# Patient Record
Sex: Female | Born: 1935 | Race: Black or African American | Hispanic: No | Smoking: Never smoker
Health system: Southern US, Community
[De-identification: ages and names within clinical notes are randomized; demographics above are authoritative.]

## PROBLEM LIST (undated history)

## (undated) DIAGNOSIS — C801 Malignant (primary) neoplasm, unspecified: Secondary | ICD-10-CM

## (undated) DIAGNOSIS — E785 Hyperlipidemia, unspecified: Secondary | ICD-10-CM

## (undated) DIAGNOSIS — Z923 Personal history of irradiation: Secondary | ICD-10-CM

## (undated) DIAGNOSIS — K219 Gastro-esophageal reflux disease without esophagitis: Secondary | ICD-10-CM

## (undated) DIAGNOSIS — E119 Type 2 diabetes mellitus without complications: Secondary | ICD-10-CM

## (undated) DIAGNOSIS — Z4689 Encounter for fitting and adjustment of other specified devices: Secondary | ICD-10-CM

## (undated) DIAGNOSIS — D509 Iron deficiency anemia, unspecified: Secondary | ICD-10-CM

## (undated) DIAGNOSIS — T7840XA Allergy, unspecified, initial encounter: Secondary | ICD-10-CM

## (undated) DIAGNOSIS — F419 Anxiety disorder, unspecified: Secondary | ICD-10-CM

## (undated) DIAGNOSIS — I10 Essential (primary) hypertension: Secondary | ICD-10-CM

## (undated) DIAGNOSIS — M1990 Unspecified osteoarthritis, unspecified site: Secondary | ICD-10-CM

## (undated) HISTORY — PX: CHOLECYSTECTOMY: SHX55

## (undated) HISTORY — DX: Type 2 diabetes mellitus without complications: E11.9

## (undated) HISTORY — DX: Unspecified osteoarthritis, unspecified site: M19.90

## (undated) HISTORY — DX: Iron deficiency anemia, unspecified: D50.9

## (undated) HISTORY — DX: Hyperlipidemia, unspecified: E78.5

## (undated) HISTORY — PX: BREAST LUMPECTOMY: SHX2

## (undated) HISTORY — DX: Allergy, unspecified, initial encounter: T78.40XA

## (undated) HISTORY — DX: Malignant (primary) neoplasm, unspecified: C80.1

## (undated) HISTORY — PX: EYE SURGERY: SHX253

## (undated) HISTORY — DX: Encounter for fitting and adjustment of other specified devices: Z46.89

## (undated) HISTORY — DX: Gastro-esophageal reflux disease without esophagitis: K21.9

## (undated) HISTORY — PX: BREAST SURGERY: SHX581

## (undated) HISTORY — DX: Anxiety disorder, unspecified: F41.9

## (undated) HISTORY — DX: Essential (primary) hypertension: I10

---

## 2001-05-08 ENCOUNTER — Other Ambulatory Visit: Admission: RE | Admit: 2001-05-08 | Discharge: 2001-05-08 | Payer: Self-pay | Admitting: Obstetrics and Gynecology

## 2001-06-04 ENCOUNTER — Ambulatory Visit (HOSPITAL_COMMUNITY): Admission: RE | Admit: 2001-06-04 | Discharge: 2001-06-04 | Payer: Self-pay | Admitting: Obstetrics and Gynecology

## 2001-06-04 ENCOUNTER — Encounter: Payer: Self-pay | Admitting: Obstetrics and Gynecology

## 2002-08-20 ENCOUNTER — Ambulatory Visit (HOSPITAL_COMMUNITY): Admission: RE | Admit: 2002-08-20 | Discharge: 2002-08-20 | Payer: Self-pay | Admitting: Pulmonary Disease

## 2002-09-03 ENCOUNTER — Ambulatory Visit (HOSPITAL_COMMUNITY): Admission: RE | Admit: 2002-09-03 | Discharge: 2002-09-03 | Payer: Self-pay | Admitting: Pulmonary Disease

## 2002-09-24 ENCOUNTER — Ambulatory Visit (HOSPITAL_COMMUNITY): Admission: RE | Admit: 2002-09-24 | Discharge: 2002-09-24 | Payer: Self-pay | Admitting: Pulmonary Disease

## 2004-05-06 ENCOUNTER — Ambulatory Visit: Admission: RE | Admit: 2004-05-06 | Discharge: 2004-08-04 | Payer: Self-pay | Admitting: Radiation Oncology

## 2006-06-11 ENCOUNTER — Emergency Department (HOSPITAL_COMMUNITY): Admission: EM | Admit: 2006-06-11 | Discharge: 2006-06-11 | Payer: Self-pay | Admitting: Emergency Medicine

## 2007-01-08 ENCOUNTER — Ambulatory Visit (HOSPITAL_COMMUNITY): Admission: RE | Admit: 2007-01-08 | Discharge: 2007-01-08 | Payer: Self-pay | Admitting: Pulmonary Disease

## 2007-01-29 ENCOUNTER — Ambulatory Visit (HOSPITAL_COMMUNITY): Admission: RE | Admit: 2007-01-29 | Discharge: 2007-01-29 | Payer: Self-pay

## 2007-02-12 ENCOUNTER — Ambulatory Visit (HOSPITAL_COMMUNITY): Admission: RE | Admit: 2007-02-12 | Discharge: 2007-02-12 | Payer: Self-pay | Admitting: Urology

## 2007-07-17 ENCOUNTER — Ambulatory Visit (HOSPITAL_COMMUNITY): Admission: RE | Admit: 2007-07-17 | Discharge: 2007-07-17 | Payer: Self-pay | Admitting: Hematology and Oncology

## 2007-11-14 ENCOUNTER — Ambulatory Visit (HOSPITAL_COMMUNITY): Admission: RE | Admit: 2007-11-14 | Discharge: 2007-11-14 | Payer: Self-pay | Admitting: Pulmonary Disease

## 2008-07-20 ENCOUNTER — Ambulatory Visit (HOSPITAL_COMMUNITY): Admission: RE | Admit: 2008-07-20 | Discharge: 2008-07-20 | Payer: Self-pay | Admitting: Hematology and Oncology

## 2009-07-22 ENCOUNTER — Ambulatory Visit (HOSPITAL_COMMUNITY): Admission: RE | Admit: 2009-07-22 | Discharge: 2009-07-22 | Payer: Self-pay | Admitting: Hematology and Oncology

## 2009-08-22 ENCOUNTER — Observation Stay (HOSPITAL_COMMUNITY): Admission: EM | Admit: 2009-08-22 | Discharge: 2009-08-23 | Payer: Self-pay | Admitting: Emergency Medicine

## 2009-08-22 ENCOUNTER — Encounter (INDEPENDENT_AMBULATORY_CARE_PROVIDER_SITE_OTHER): Payer: Self-pay | Admitting: General Surgery

## 2010-08-03 ENCOUNTER — Ambulatory Visit (HOSPITAL_COMMUNITY): Admission: RE | Admit: 2010-08-03 | Discharge: 2010-08-03 | Payer: Self-pay

## 2010-09-13 ENCOUNTER — Emergency Department (HOSPITAL_COMMUNITY): Admission: EM | Admit: 2010-09-13 | Discharge: 2010-09-13 | Payer: Self-pay | Admitting: Emergency Medicine

## 2010-12-31 ENCOUNTER — Other Ambulatory Visit (HOSPITAL_COMMUNITY): Payer: Self-pay | Admitting: Hematology and Oncology

## 2010-12-31 DIAGNOSIS — Z9889 Other specified postprocedural states: Secondary | ICD-10-CM

## 2011-01-01 ENCOUNTER — Encounter: Payer: Self-pay | Admitting: Pulmonary Disease

## 2011-03-17 LAB — URINALYSIS, ROUTINE W REFLEX MICROSCOPIC
Glucose, UA: NEGATIVE mg/dL
Ketones, ur: NEGATIVE mg/dL
Nitrite: NEGATIVE
Specific Gravity, Urine: 1.025 (ref 1.005–1.030)
pH: 5.5 (ref 5.0–8.0)

## 2011-03-17 LAB — BASIC METABOLIC PANEL
BUN: 4 mg/dL — ABNORMAL LOW (ref 6–23)
CO2: 27 mEq/L (ref 19–32)
Chloride: 102 mEq/L (ref 96–112)
GFR calc non Af Amer: >60 mL/min (ref >60)
Glucose, Bld: 103 mg/dL — ABNORMAL HIGH (ref 70–99)
Potassium: 4 mEq/L (ref 3.5–5.1)
Sodium: 138 mEq/L (ref 135–145)

## 2011-03-17 LAB — DIFFERENTIAL
Basophils Relative: 0 % (ref 0–1)
Basophils Relative: 0 % (ref 0–1)
Eosinophils Absolute: 0 10*3/uL (ref 0.0–0.7)
Eosinophils Absolute: 0 10*3/uL (ref 0.0–0.7)
Eosinophils Relative: 0 % (ref 0–5)
Lymphs Abs: 1.5 10*3/uL (ref 0.7–4.0)
Monocytes Absolute: 0.7 10*3/uL (ref 0.1–1.0)
Monocytes Relative: 7 % (ref 3–12)
Monocytes Relative: 8 % (ref 3–12)
Neutrophils Relative %: 74 % (ref 43–77)

## 2011-03-17 LAB — URINE MICROSCOPIC-ADD ON

## 2011-03-17 LAB — COMPREHENSIVE METABOLIC PANEL
ALT: 8 U/L (ref 0–35)
AST: 15 U/L (ref 0–37)
Albumin: 3.7 g/dL (ref 3.5–5.2)
Alkaline Phosphatase: 70 U/L (ref 39–117)
Calcium: 9.1 mg/dL (ref 8.4–10.5)
GFR calc Af Amer: >60 mL/min (ref >60)
Potassium: 4.5 mEq/L (ref 3.5–5.1)
Sodium: 136 mEq/L (ref 135–145)
Total Protein: 7.2 g/dL (ref 6.0–8.3)

## 2011-03-17 LAB — CBC
HCT: 36.8 % (ref 36.0–46.0)
Hemoglobin: 12.4 g/dL (ref 12.0–15.0)
Hemoglobin: 12.4 g/dL (ref 12.0–15.0)
MCHC: 34.2 g/dL (ref 30.0–36.0)
MCV: 85.5 fL (ref 78.0–100.0)
RDW: 13.7 % (ref 11.5–15.5)
RDW: 14 % (ref 11.5–15.5)

## 2011-03-17 LAB — GLUCOSE, CAPILLARY: Glucose-Capillary: 136 mg/dL — ABNORMAL HIGH (ref 70–99)

## 2011-04-28 NOTE — Op Note (Signed)
NAME:  Kimberly Best, Kimberly Best                ACCOUNT NO.:  [id number]   MEDICAL RECORD NO.:  [id number]          PATIENT TYPE:  AMB   LOCATION:  DAY                           FACILITY:  [facility]   PHYSICIAN:  Ky Barban, M.D.DATE OF BIRTH:  [birth year]   DATE OF PROCEDURE:  02/12/2007  DATE OF DISCHARGE:                               OPERATIVE REPORT   PREOPERATIVE DIAGNOSIS:  A dilated right ureter.   POSTOPERATIVE DIAGNOSIS:  Right megaureter, probably congenital.   PROCEDURE:  Cystoscopy, right retrograde pyelogram, right ureteroscopy.   ANESTHESIA:  Spinal.   PROCEDURE:  The patient is given spinal anesthesia, placed in lithotomy  position, usual prep and drape.  A #25 cystoscope introduced into the  bladder, which is thoroughly inspected.  No tumors, stone, foreign body  or inflammation.  Right ureteral orifice catheterized with a wedge  catheter.  Hypaque is injected.  The ureter, especially the distal third  of the ureter, is markedly dilated and even the proximal ureter is  dilated.  It looks like megaureter, but I do not see any definite  filling defect.  There is obstruction at the ureteral orifice level.  Decided to look into that area as a guidewire is passed up into the  renal pelvis and over the guidewire short rigid ureteroscope was  introduced.  The ureter up to the mid ureter was inspected and looks  fine.  The ureteral orifice is slightly tight but decided not to dilate  this accept passing the ureteroscope has dilated that.  All the  instruments are removed.  The patient left the operating room in  satisfactory condition.      Ky Barban, M.D.  Electronically Signed     MIJ/MEDQ  D:  02/12/2007  T:  02/12/2007  Job:  [id number]

## 2011-04-28 NOTE — H&P (Signed)
NAME:  Kimberly Best, Kimberly Best                ACCOUNT NO.:  [id number]   MEDICAL RECORD NO.:  [id number]          PATIENT TYPE:  AMB   LOCATION:  DAY                           FACILITY:  [facility]   PHYSICIAN:  Ky Barban, M.D.DATE OF BIRTH:  Aug 23, 1933   DATE OF ADMISSION:  DATE OF DISCHARGE:  LH                              HISTORY & PHYSICAL   CHIEF COMPLAINT:  Dilated right distal ureter.   HISTORY:  This 77 year old female had an episode of hematuria.  CT scan  was done that showed right ureter in the distal quad was dilated.  Looks  like ureterocele.  The upper ureter and the kidney is not dilated. She  is not having any pain.  There is no stone visible on CT scan.  The  patient has a history of having cancer of the right breast.  Had  lumpectomy followed by radiation and chemotherapy and she is very  concerned about this problem, so I have suggested we will do a  cystoscopy and possible ureteroscopy under anesthesia as outpatient.  The procedure, its limitations and complications discussed.   PAST MEDICAL HISTORY:  Past history is as mentioned above.   PERSONAL HISTORY:  Does not smoke or drink.   REVIEW OF SYSTEMS:  Unremarkable.   ON EXAMINATION:  VITAL SIGNS:  Moderately obese.  Blood pressure 140/80.  Temperature is normal.  CENTRAL NERVOUS SYSTEM:  No gross neurological deficit.  HEENT: Negative.  NECK: Negative.  CHEST:  Symmetrical.  HEART:  Regular.  Sinus rhythm.  ABDOMEN:  Soft, full.  Liver, spleen, kidneys not palpable.  No CVA  tenderness.  PELVIC EXAM:  No adnexal mass or tenderness.   IMPRESSION:  1. Dilated distal right ureter, possible ureterocele.  2. Cancer of the right breast, status post lumpectomy and radiation      and chemotherapy.   PLAN:  Cystoscopy, right retrograde pyelogram, and possible ureteroscopy  under anesthesia as out patient.     Ky Barban, M.D.  Electronically Signed    MIJ/MEDQ  D:  02/11/2007  T:  02/11/2007  Job:   [id number]

## 2011-08-09 ENCOUNTER — Ambulatory Visit (HOSPITAL_COMMUNITY)
Admission: RE | Admit: 2011-08-09 | Discharge: 2011-08-09 | Disposition: A | Discharge status: Home / Self Care | Payer: Medicare Other | Source: Ambulatory Visit | Attending: Hematology and Oncology | Admitting: Hematology and Oncology

## 2011-08-09 DIAGNOSIS — Z853 Personal history of malignant neoplasm of breast: Secondary | ICD-10-CM | POA: Insufficient documentation

## 2011-08-09 DIAGNOSIS — Z9889 Other specified postprocedural states: Secondary | ICD-10-CM

## 2011-08-21 ENCOUNTER — Other Ambulatory Visit (HOSPITAL_COMMUNITY): Payer: Self-pay | Admitting: Hematology and Oncology

## 2011-08-21 DIAGNOSIS — Z139 Encounter for screening, unspecified: Secondary | ICD-10-CM

## 2011-12-12 HISTORY — PX: BREAST BIOPSY: SHX20

## 2012-08-14 ENCOUNTER — Other Ambulatory Visit (HOSPITAL_COMMUNITY): Payer: Self-pay | Admitting: Hematology and Oncology

## 2012-08-14 ENCOUNTER — Ambulatory Visit (HOSPITAL_COMMUNITY)
Admission: RE | Admit: 2012-08-14 | Discharge: 2012-08-14 | Disposition: A | Discharge status: Home / Self Care | Payer: Medicare Other | Source: Ambulatory Visit | Attending: Hematology and Oncology | Admitting: Hematology and Oncology

## 2012-08-14 DIAGNOSIS — Z139 Encounter for screening, unspecified: Secondary | ICD-10-CM

## 2012-08-14 DIAGNOSIS — R922 Inconclusive mammogram: Secondary | ICD-10-CM | POA: Insufficient documentation

## 2012-08-19 ENCOUNTER — Other Ambulatory Visit: Payer: Self-pay | Admitting: Hematology and Oncology

## 2012-08-19 DIAGNOSIS — R928 Other abnormal and inconclusive findings on diagnostic imaging of breast: Secondary | ICD-10-CM

## 2012-08-21 ENCOUNTER — Encounter: Payer: Medicare Other | Admitting: Hematology and Oncology

## 2012-08-21 DIAGNOSIS — M899 Disorder of bone, unspecified: Secondary | ICD-10-CM

## 2012-08-21 DIAGNOSIS — C50319 Malignant neoplasm of lower-inner quadrant of unspecified female breast: Secondary | ICD-10-CM

## 2012-08-21 DIAGNOSIS — Z170 Estrogen receptor positive status [ER+]: Secondary | ICD-10-CM

## 2012-08-21 DIAGNOSIS — R920 Mammographic microcalcification found on diagnostic imaging of breast: Secondary | ICD-10-CM

## 2012-08-21 DIAGNOSIS — M949 Disorder of cartilage, unspecified: Secondary | ICD-10-CM

## 2012-08-28 ENCOUNTER — Other Ambulatory Visit: Payer: Self-pay | Admitting: Hematology and Oncology

## 2012-08-28 ENCOUNTER — Ambulatory Visit (HOSPITAL_COMMUNITY)
Admission: RE | Admit: 2012-08-28 | Discharge: 2012-08-28 | Disposition: A | Discharge status: Home / Self Care | Payer: Medicare Other | Source: Ambulatory Visit | Attending: Hematology and Oncology | Admitting: Hematology and Oncology

## 2012-08-28 DIAGNOSIS — Z853 Personal history of malignant neoplasm of breast: Secondary | ICD-10-CM | POA: Insufficient documentation

## 2012-08-28 DIAGNOSIS — R921 Mammographic calcification found on diagnostic imaging of breast: Secondary | ICD-10-CM

## 2012-08-28 DIAGNOSIS — R928 Other abnormal and inconclusive findings on diagnostic imaging of breast: Secondary | ICD-10-CM | POA: Insufficient documentation

## 2012-09-03 ENCOUNTER — Ambulatory Visit
Admission: RE | Admit: 2012-09-03 | Discharge: 2012-09-03 | Disposition: A | Discharge status: Home / Self Care | Payer: Medicare Other | Source: Ambulatory Visit | Attending: Hematology and Oncology | Admitting: Hematology and Oncology

## 2012-09-03 DIAGNOSIS — R921 Mammographic calcification found on diagnostic imaging of breast: Secondary | ICD-10-CM

## 2012-09-16 ENCOUNTER — Encounter: Payer: Medicare Other | Admitting: Internal Medicine

## 2012-09-16 ENCOUNTER — Ambulatory Visit (HOSPITAL_COMMUNITY): Payer: Medicare Other | Admitting: Oncology

## 2012-09-23 DIAGNOSIS — C50919 Malignant neoplasm of unspecified site of unspecified female breast: Secondary | ICD-10-CM

## 2013-03-19 ENCOUNTER — Other Ambulatory Visit: Payer: Self-pay | Admitting: Pulmonary Disease

## 2013-03-19 DIAGNOSIS — R921 Mammographic calcification found on diagnostic imaging of breast: Secondary | ICD-10-CM

## 2013-04-09 ENCOUNTER — Ambulatory Visit (HOSPITAL_COMMUNITY)
Admission: RE | Admit: 2013-04-09 | Discharge: 2013-04-09 | Disposition: A | Discharge status: Home / Self Care | Payer: Medicare Other | Source: Ambulatory Visit | Attending: Pulmonary Disease | Admitting: Pulmonary Disease

## 2013-04-09 DIAGNOSIS — N6019 Diffuse cystic mastopathy of unspecified breast: Secondary | ICD-10-CM | POA: Insufficient documentation

## 2013-04-09 DIAGNOSIS — R921 Mammographic calcification found on diagnostic imaging of breast: Secondary | ICD-10-CM

## 2013-04-09 DIAGNOSIS — Z09 Encounter for follow-up examination after completed treatment for conditions other than malignant neoplasm: Secondary | ICD-10-CM | POA: Insufficient documentation

## 2013-08-13 ENCOUNTER — Other Ambulatory Visit (HOSPITAL_COMMUNITY): Payer: Self-pay | Admitting: Pulmonary Disease

## 2013-08-13 DIAGNOSIS — Z139 Encounter for screening, unspecified: Secondary | ICD-10-CM

## 2013-08-18 ENCOUNTER — Ambulatory Visit (HOSPITAL_COMMUNITY)
Admission: RE | Admit: 2013-08-18 | Discharge: 2013-08-18 | Disposition: A | Discharge status: Home / Self Care | Payer: Medicare Other | Source: Ambulatory Visit | Attending: Pulmonary Disease | Admitting: Pulmonary Disease

## 2013-08-18 DIAGNOSIS — Z139 Encounter for screening, unspecified: Secondary | ICD-10-CM

## 2013-08-18 DIAGNOSIS — Z1231 Encounter for screening mammogram for malignant neoplasm of breast: Secondary | ICD-10-CM | POA: Insufficient documentation

## 2013-09-24 LAB — PULMONARY FUNCTION TEST

## 2014-06-10 ENCOUNTER — Ambulatory Visit (HOSPITAL_COMMUNITY)
Admission: RE | Admit: 2014-06-10 | Discharge: 2014-06-10 | Disposition: A | Discharge status: Home / Self Care | Payer: Medicare Other | Source: Ambulatory Visit | Attending: Pulmonary Disease | Admitting: Pulmonary Disease

## 2014-06-10 DIAGNOSIS — R32 Unspecified urinary incontinence: Secondary | ICD-10-CM | POA: Diagnosis not present

## 2014-06-10 DIAGNOSIS — M6281 Muscle weakness (generalized): Secondary | ICD-10-CM | POA: Insufficient documentation

## 2014-06-10 DIAGNOSIS — IMO0001 Reserved for inherently not codable concepts without codable children: Secondary | ICD-10-CM | POA: Insufficient documentation

## 2014-06-10 DIAGNOSIS — N3941 Urge incontinence: Secondary | ICD-10-CM

## 2014-06-10 DIAGNOSIS — N393 Stress incontinence (female) (male): Secondary | ICD-10-CM

## 2014-06-10 NOTE — Evaluation (Signed)
Physical Therapy Evaluation  Patient Details  Name: Kimberly Best MRN: [id number] Date of Birth: 17-Aug-1933  Today's Date: 06/10/2014 Time: 4098-1191 PT Time Calculation (min): 45 min Charge:  Evaluation 1430-1515              Visit#: 1 of 8  Re-eval: 07/10/14 Assessment Diagnosis: urinary incontinence. Prior Therapy: none   Authorization: [employer] medicare    Past Medical History: No past medical history on file. Past Surgical History: No past surgical history on file.  Subjective Symptoms/Limitations Symptoms: Kimberly Best states that her bladder has been dropping for the past several years.  She had a sling placed to hold her bladder up  in 2003 but they had to take it out every two months and then it was bothering so much that she did not want it inserted again.  She was told to wear a girdle but this does not seem like it is helping  Pertinent History: breast cancer 2005. Patient Stated Goals: urinate less frequent and  less accidents  Pain Assessment Currently in Pain?: No/denies  Balance Screening  no falls    Cognition/Observation Observation/Other Assessments Observations: protruding abdominal mm   Sensation/Coordination/Flexibility/Functional Tests Functional Tests Functional Tests: Strong urge to urinate=wet; running water =wet; accidents per week 14; keys in door= wet;  Functional Tests: feel urge to urinate= every 2 hours.  Functional Tests: coughing and sneezing =wet  Assessment Lumbar Strength Lumbar Flexion:  (upper abdominal 3-/5; Lower abdominal 3-)  Exercise/Treatments    Supine Ab Set: 10 reps Other Supine Lumbar Exercises: Kegal 10x contract 2 sec relx 4 seconds; 10x contract 12 seconds relax 24 seconds   Physical Therapy Assessment and Plan PT Assessment and Plan Clinical Impression Statement: Pt is a 80 yo female who has had a prolapsed uterus for many years.  She is currently being referred to PT for urinary incontinence.  Examination demonstrates  weakened abdominal mm, decreased core strength. Pt will benefit from skilled PT to educate in posture, core strengthening mm and Kegals to improve her urinary functionn and improve her quality of life. PT Frequency: Min 2X/week PT Duration: 4 weeks PT Treatment/Interventions: Therapeutic exercise PT Plan: begin heelslides; bent knee raises; hip abduction progress to SLR,  prone SLR then progess to quadriped, abdominal curls  as well as T-band exercises for incontinence:(PNF patterns, putting, flex Rt/ LT and extension B.    Goals PT Short Term Goals Time to Complete Short Term Goals: 2 weeks PT Short Term Goal 1: Pt will be able to demonstrate HEP accurately PT Short Term Goal 2: increase voiding interval to every three hours  PT Short Term Goal 3: decrease accidents from 14 to 8 per week  PT Long Term Goals Time to Complete Long Term Goals: 4 weeks PT Long Term Goal 1: increase pelvic floor endurance to 20 seconds PT Long Term Goal 2: decrease accident to 5 per week.  Long Term Goal 3: improve abdominal strength to 4-/5  Long Term Goal 4: Pt to be able to verbalize bladder irritants and state she has decreased the consumption of these items   Problem List Patient Active Problem List   Diagnosis Date Noted  . Female stress incontinence 06/10/2014  . Urge incontinence 06/10/2014    PT Plan of Care PT Home Exercise Plan: given  GP Functional Assessment Tool Used: clinical judgement Functional Limitation: Other PT primary Other PT Primary Current Status (Y7829): At least 60 percent but less than 80 percent impaired, limited or restricted Other PT  Primary Goal Status [id number]): At least 40 percent but less than 60 percent impaired, limited or restricted  RUSSELL,CINDY 06/10/2014, 4:43 PM  Physician Documentation Your signature is required to indicate approval of the treatment plan as stated above.  Please sign and either send electronically or make a copy of this report for your files  and return this physician signed original.   Please mark one 1.__approve of plan  2. ___approve of plan with the following conditions.   ______________________________                                                          _____________________ Physician Signature                                                                                                             Date

## 2014-06-17 ENCOUNTER — Ambulatory Visit (HOSPITAL_COMMUNITY)
Admission: RE | Admit: 2014-06-17 | Discharge: 2014-06-17 | Disposition: A | Discharge status: Home / Self Care | Payer: Medicare Other | Source: Ambulatory Visit

## 2014-06-17 DIAGNOSIS — IMO0001 Reserved for inherently not codable concepts without codable children: Secondary | ICD-10-CM | POA: Diagnosis not present

## 2014-06-17 DIAGNOSIS — N393 Stress incontinence (female) (male): Secondary | ICD-10-CM

## 2014-06-17 DIAGNOSIS — N3941 Urge incontinence: Secondary | ICD-10-CM

## 2014-06-17 NOTE — Progress Notes (Signed)
Physical Therapy Treatment Patient Details  Name: Kimberly Best MRN: [id number] Date of Birth: 09/07/1933  Today's Date: 06/17/2014 Time: 1520-1550 PT Time Calculation (min): 30 min Charge: TE 1520-1550   Visit#: 2 of 8  Re-eval: 07/10/14 Assessment Diagnosis: urinary incontinence. Next MD Visit: Kimberly Best unscheduled Prior Therapy: none   Authorization: [employer] medicare  Authorization Time Period:    Authorization Visit#:   of     Subjective: Symptoms/Limitations Symptoms: Pt reported complaince with kegal exercises and reports no incontience episodes since initial eval.  Pain free today.   Pain Assessment Currently in Pain?: No/denies  Objective:   Exercise/Treatments Supine Ab Set: 10 reps Heel Slides: 10 reps;Limitations Heel Slides Limitations: with tactile and verbal cueing for contraction Bent Knee Raise: 10 reps;5 seconds;Limitations Bent Knee Raise Limitations: tactile and verbal cueing for contraction Other Supine Lumbar Exercises: Kegal 10x contraction Sidelying Hip Abduction: 10 reps Prone  Straight Leg Raise: 10 reps     Physical Therapy Assessment and Plan PT Assessment and Plan Clinical Impression Statement: Began PT POC for pelvic floor contraciton strengtheing to assist with incontience control.  Tactile and verbal cueing for correct mm activation.  Pt was able to demonstrate appropriate mm contraction but unable to hold due to core weakness.  Began gluteal strengtehning exercises with ab sets, min cueing for form and techniuqe.  Main cueing required for control with exercises.   PT Plan: begin heelslides; bent knee raises; hip abduction progress to SLR,  prone SLR then progess to quadriped, abdominal curls  as well as T-band exercises for incontinence:(PNF patterns, putting, flex Rt/ LT and extension B.    Goals PT Short Term Goals PT Short Term Goal 1: Pt will be able to demonstrate HEP accurately PT Short Term Goal 2: increase voiding interval to every  three hours  PT Short Term Goal 2 - Progress: Progressing toward goal PT Short Term Goal 3: decrease accidents from 14 to 8 per week  PT Short Term Goal 3 - Progress: Progressing toward goal  Problem List Patient Active Problem List   Diagnosis Date Noted  . Female stress incontinence 06/10/2014  . Urge incontinence 06/10/2014    PT - End of Session Activity Tolerance: Patient tolerated treatment well General Behavior During Therapy: Kimberly Best for tasks assessed/performed  GP    Kimberly Best 06/17/2014, 3:56 PM

## 2014-06-19 ENCOUNTER — Inpatient Hospital Stay (HOSPITAL_COMMUNITY): Admission: RE | Admit: 2014-06-19 | Payer: Medicare Other | Source: Ambulatory Visit

## 2014-06-23 ENCOUNTER — Ambulatory Visit (HOSPITAL_COMMUNITY)
Admission: RE | Admit: 2014-06-23 | Discharge: 2014-06-23 | Disposition: A | Discharge status: Home / Self Care | Payer: Medicare Other | Source: Ambulatory Visit | Attending: Physical Therapy | Admitting: Physical Therapy

## 2014-06-23 DIAGNOSIS — IMO0001 Reserved for inherently not codable concepts without codable children: Secondary | ICD-10-CM | POA: Diagnosis not present

## 2014-06-23 NOTE — Progress Notes (Signed)
  Physical Therapy Treatment Patient Details  Name: Kimberly Best MRN: [id number] Date of Birth: 14-Mar-1933  Today's Date: 06/23/2014 Time: 1350-1430 PT Time Calculation (min): 40 min TE 8937-3428  Visit#: 3 of 8  Re-eval: 07/10/14 Assessment Diagnosis: urinary incontinence.  Authorization: [employer] medicare  Subjective: Symptoms/Limitations Symptoms: Pt reports mild soreness in abs after last treatment session, no complaints of pain today.   Pt reports cramping into legs in the past couple days as her rx for potassium has run out.    Exercise/Treatments Stretches Sports administrator: 30 seconds;1 rep;Limitations Sports administrator Limitations: Manual Stretch Supine Ab Set: 10 reps;3 seconds Heel Slides: 20 reps (2x10) Heel Slides Limitations: with tactile and verbal cueing for contraction Bent Knee Raise: 15 reps;5 seconds Bent Knee Raise Limitations: TC for contraction Straight Leg Raise: 20 reps (2x10) Other Supine Lumbar Exercises: Kegal/Glut Set 3" x10 Other Supine Lumbar Exercises: Manual HS/calf stretch 30" x1 Sidelying Hip Abduction: 20 reps (2x10) Prone  Straight Leg Raise: 20 reps (2x10)  Physical Therapy Assessment and Plan PT Assessment and Plan Clinical Impression Statement: Continued POC increasing reps of exercises to tolerance.  Noted delayed response of abs with contraction, requiring tactile cueing to initiate response.  Pt did report cramping into (B) LE during treatment session, and manual stretching was completed of calf, HS, and quads x1 until sensations decreased.  Pt did require VC for breathing as pt tended to hold her breath during sustained contractions; VC to breath out during contractions vs. holding breath.   Pt will benefit from skilled therapeutic intervention in order to improve on the following deficits: Decreased activity tolerance;Decreased strength Rehab Potential: Good PT Frequency: Min 2X/week PT Duration: 4 weeks PT Treatment/Interventions:  Therapeutic exercise PT Plan: Progress exercises to tolerance, with increasing reps vs. increasing contraction time.  Tband exercises (PNF pattern, putting, flex Rt/Lt, and extension (B))     Problem List Patient Active Problem List   Diagnosis Date Noted  . Female stress incontinence 06/10/2014  . Urge incontinence 06/10/2014    PT - End of Session Equipment Utilized During Treatment: Gait belt Activity Tolerance: Patient tolerated treatment well General Behavior During Therapy: WFL for tasks assessed/performed PT Plan of Care PT Home Exercise Plan: Updated HEP for abdominal isometric, bent knee raise, heel slide, SLR fl/abd/ext   Rithwik Schmieg 06/23/2014, 2:45 PM

## 2014-06-25 ENCOUNTER — Inpatient Hospital Stay (HOSPITAL_COMMUNITY): Admission: RE | Admit: 2014-06-25 | Payer: Medicare Other | Source: Ambulatory Visit

## 2014-06-25 ENCOUNTER — Telehealth (HOSPITAL_COMMUNITY): Payer: Self-pay

## 2014-06-30 ENCOUNTER — Telehealth (HOSPITAL_COMMUNITY): Payer: Self-pay | Admitting: Physical Therapy

## 2014-06-30 ENCOUNTER — Inpatient Hospital Stay (HOSPITAL_COMMUNITY): Admission: RE | Admit: 2014-06-30 | Payer: Medicare Other | Source: Ambulatory Visit | Admitting: Physical Therapy

## 2014-06-30 NOTE — Telephone Encounter (Signed)
Patient missed todays appointment.  Contacted by phone and was informed her husband had passed away.  Patient asked to cancel all appointments this week.

## 2014-07-02 ENCOUNTER — Ambulatory Visit (HOSPITAL_COMMUNITY): Payer: Medicare Other | Admitting: Physical Therapy

## 2014-07-07 ENCOUNTER — Ambulatory Visit (HOSPITAL_COMMUNITY): Payer: Medicare Other | Admitting: Physical Therapy

## 2014-07-09 ENCOUNTER — Ambulatory Visit (HOSPITAL_COMMUNITY): Admission: RE | Admit: 2014-07-09 | Payer: Medicare Other | Source: Ambulatory Visit | Admitting: Physical Therapy

## 2014-08-10 ENCOUNTER — Other Ambulatory Visit (HOSPITAL_COMMUNITY): Payer: Self-pay | Admitting: Pulmonary Disease

## 2014-08-10 DIAGNOSIS — Z139 Encounter for screening, unspecified: Secondary | ICD-10-CM

## 2014-08-20 ENCOUNTER — Ambulatory Visit (INDEPENDENT_AMBULATORY_CARE_PROVIDER_SITE_OTHER): Payer: Medicare Other | Admitting: Otolaryngology

## 2014-08-20 DIAGNOSIS — H903 Sensorineural hearing loss, bilateral: Secondary | ICD-10-CM

## 2014-08-20 DIAGNOSIS — J343 Hypertrophy of nasal turbinates: Secondary | ICD-10-CM

## 2014-08-20 DIAGNOSIS — R42 Dizziness and giddiness: Secondary | ICD-10-CM

## 2014-08-20 DIAGNOSIS — J310 Chronic rhinitis: Secondary | ICD-10-CM

## 2014-08-21 ENCOUNTER — Ambulatory Visit (HOSPITAL_COMMUNITY)
Admission: RE | Admit: 2014-08-21 | Discharge: 2014-08-21 | Disposition: A | Discharge status: Home / Self Care | Payer: Medicare Other | Source: Ambulatory Visit | Attending: Pulmonary Disease | Admitting: Pulmonary Disease

## 2014-08-21 DIAGNOSIS — Z139 Encounter for screening, unspecified: Secondary | ICD-10-CM

## 2014-08-21 DIAGNOSIS — Z1231 Encounter for screening mammogram for malignant neoplasm of breast: Secondary | ICD-10-CM | POA: Diagnosis present

## 2014-08-27 ENCOUNTER — Other Ambulatory Visit (INDEPENDENT_AMBULATORY_CARE_PROVIDER_SITE_OTHER): Payer: Self-pay | Admitting: Otolaryngology

## 2014-08-27 DIAGNOSIS — J320 Chronic maxillary sinusitis: Secondary | ICD-10-CM

## 2014-08-31 ENCOUNTER — Ambulatory Visit (HOSPITAL_COMMUNITY)
Admission: RE | Admit: 2014-08-31 | Discharge: 2014-08-31 | Disposition: A | Discharge status: Home / Self Care | Payer: Medicare Other | Source: Ambulatory Visit | Attending: Otolaryngology | Admitting: Otolaryngology

## 2014-08-31 DIAGNOSIS — J329 Chronic sinusitis, unspecified: Secondary | ICD-10-CM | POA: Diagnosis present

## 2014-08-31 DIAGNOSIS — J320 Chronic maxillary sinusitis: Secondary | ICD-10-CM

## 2014-09-10 ENCOUNTER — Ambulatory Visit (INDEPENDENT_AMBULATORY_CARE_PROVIDER_SITE_OTHER): Payer: Medicare Other | Admitting: Otolaryngology

## 2014-09-10 DIAGNOSIS — J310 Chronic rhinitis: Secondary | ICD-10-CM

## 2014-09-10 DIAGNOSIS — J343 Hypertrophy of nasal turbinates: Secondary | ICD-10-CM

## 2014-11-09 ENCOUNTER — Encounter: Payer: Medicare Other | Admitting: Nutrition

## 2014-11-12 ENCOUNTER — Ambulatory Visit (INDEPENDENT_AMBULATORY_CARE_PROVIDER_SITE_OTHER): Payer: Medicare Other | Admitting: Otolaryngology

## 2014-11-12 DIAGNOSIS — J343 Hypertrophy of nasal turbinates: Secondary | ICD-10-CM

## 2014-11-12 DIAGNOSIS — J310 Chronic rhinitis: Secondary | ICD-10-CM

## 2014-11-18 ENCOUNTER — Encounter: Payer: Self-pay | Admitting: Nutrition

## 2014-11-18 ENCOUNTER — Encounter: Payer: Medicare Other | Attending: Pulmonary Disease | Admitting: Nutrition

## 2014-11-18 VITALS — Ht <= 58 in | Wt 161.0 lb

## 2014-11-18 DIAGNOSIS — IMO0002 Reserved for concepts with insufficient information to code with codable children: Secondary | ICD-10-CM

## 2014-11-18 DIAGNOSIS — E1165 Type 2 diabetes mellitus with hyperglycemia: Secondary | ICD-10-CM

## 2014-11-18 DIAGNOSIS — E118 Type 2 diabetes mellitus with unspecified complications: Secondary | ICD-10-CM | POA: Diagnosis not present

## 2014-11-18 DIAGNOSIS — Z713 Dietary counseling and surveillance: Secondary | ICD-10-CM | POA: Insufficient documentation

## 2014-11-18 NOTE — Progress Notes (Signed)
  Medical Nutrition Therapy:  Appt start time: 0800 end time:  0900.   Assessment:  Primary concerns today: Diabetes.Lives by herself. Lost her husband in July. Cooks and shops for herself. Has a meter but hasn't been checking. Willing to check BS a few times per week. History of breast cancer and takes weekly chemotherapy pills. Complains of pains in her head and headaches/dizziness at times. Going to see a neurologist. MRI. Last A1C unavailable at present. Requested copy from PCP. Not able to exercise much due to headaches and dizziness. Tries to do some chair exercises.   Preferred Learning Style:     No preference indicated   Learning Readiness:     Ready  Change in progress   MEDICATIONS: see list   DIETARY INTAKE:  24-hr recall:  B ( AM): Coco puffs, skim milk, OR 2 eggs cinnamon toast with raisin, coffee, water  Snk ( AM): peanuts or nabs L ( PM): chicken salad sandwich with lettuce on ww bread,  With banana or grapes, water, Sprite Snk ( PM): nabs, or fruit or nuts or animal crackers D ( PM): Chicken, water Snk ( PM): peanuts Beverages: cranberry juice, water, sprite  Usual physical activity: ADL's  Estimated energy needs: 1200 calories 135 g carbohydrates 90 g protein 33 g fat  Progress Towards Goal(s):  In progress.   Nutritional Diagnosis:  NB-1.1 Food and nutrition-related knowledge deficit As related to Diabetes.  As evidenced by A1c >6.5%.    Intervention:  Nutrition counseling and diabetes eduction on diet, exercise, and target ranges for blood sugars. . Plan:  Aim for 2-3 Carb Choices per meal (30-45 grams) +/- 1 either way  Avoid snacks between meal unless eating vegetables. Drink water instead Sprite or juice Follow Plate Method. Cut out sweets, candy and cookies Include protein in moderation with your meals Consider  increasing your activity level by 15  minutes daily as tolerated Be sure to take medication Metformin as directed by MD Goal:  Get A1C down to 6.5% or below.  Teaching Method Utilized:  Visual Auditory Hands on  Handouts given during visit include: The Plate Method Carb Counting and Food Label handouts Meal Plan Card  Barriers to learning/adherence to lifestyle change: none  Demonstrated degree of understanding via:  Teach Back   Monitoring/Evaluation:  Dietary intake, exercise, meal planning, and body weight in 3 month(s).

## 2014-11-18 NOTE — Patient Instructions (Signed)
Plan:  Aim for 2-3 Carb Choices per meal (45 grams) +/- 1 either way  Avoid snacks between meal unless eating vegetables. Drink water instead Sprite or juice Follow Plate Method. Cut out sweets, candy and cookies Include protein in moderation with your meals Consider  increasing your activity level by 15  minutes daily as tolerated Be sure to take medication Metformin as directed by MD Goal: Get A1C down to 6.5% or below.

## 2014-12-15 DIAGNOSIS — G4459 Other complicated headache syndrome: Secondary | ICD-10-CM | POA: Diagnosis not present

## 2014-12-15 DIAGNOSIS — I951 Orthostatic hypotension: Secondary | ICD-10-CM | POA: Diagnosis not present

## 2014-12-15 DIAGNOSIS — R42 Dizziness and giddiness: Secondary | ICD-10-CM | POA: Diagnosis not present

## 2015-01-19 DIAGNOSIS — M545 Low back pain: Secondary | ICD-10-CM | POA: Diagnosis not present

## 2015-01-19 DIAGNOSIS — E119 Type 2 diabetes mellitus without complications: Secondary | ICD-10-CM | POA: Diagnosis not present

## 2015-01-19 DIAGNOSIS — N8189 Other female genital prolapse: Secondary | ICD-10-CM | POA: Diagnosis not present

## 2015-01-19 DIAGNOSIS — I10 Essential (primary) hypertension: Secondary | ICD-10-CM | POA: Diagnosis not present

## 2015-01-28 ENCOUNTER — Encounter: Payer: Self-pay | Admitting: *Deleted

## 2015-02-01 ENCOUNTER — Encounter: Payer: Self-pay | Admitting: Obstetrics and Gynecology

## 2015-02-01 ENCOUNTER — Ambulatory Visit (INDEPENDENT_AMBULATORY_CARE_PROVIDER_SITE_OTHER): Payer: Medicare Other | Admitting: Obstetrics and Gynecology

## 2015-02-01 VITALS — BP 120/70 | Wt 157.0 lb

## 2015-02-01 DIAGNOSIS — N813 Complete uterovaginal prolapse: Secondary | ICD-10-CM | POA: Diagnosis not present

## 2015-02-01 NOTE — Progress Notes (Addendum)
Patient ID: Kimberly Best, female   DOB: 1933/09/22, 81 y.o.   MRN: [id number]    [facility] Visit  Patient name: Kimberly Best MRN [id number]  Date of birth: 18-Sep-1933  CC & HPI:  Kimberly Best is a 81 y.o. female presenting today for recurrent hanging bladder. Pt notes she was wearing a pessary, but stopped using it because of recurrent infections. Pt had 2 children, one born by vaginal delivery. She reports loose BMs and denies defecation difficulties.  Pt has been seeking care in [place], Dr Adah Perl, using pessary, but she has quit using pessary due to recurrent need to be seen to clean pessary. Pt's prolapse has become more symptomatic since then.  ROS:  A complete 10 system review of systems was obtained and all systems are negative except as noted in the HPI and PMH.   Pertinent History Reviewed:   Reviewed Medical         Past Medical History  Diagnosis Date  . Diabetes mellitus without complication   . Cancer   . Anxiety   . GERD (gastroesophageal reflux disease)                               Surgical Hx:    Past Surgical History  Procedure Laterality Date  . Breast surgery      lumpectomy    Medications: Reviewed & Updated - see associated section                       Current outpatient prescriptions:  .  alendronate (FOSAMAX) 70 MG tablet, Take 70 mg by mouth. Take with a full glass of water on an empty stomach., Disp: , Rfl:  .  ALPRAZolam (XANAX) 0.5 MG tablet, Take 0.5 mg by mouth at bedtime as needed for anxiety., Disp: , Rfl:  .  lisinopril (PRINIVIL,ZESTRIL) 2.5 MG tablet, Take 2.5 mg by mouth daily., Disp: , Rfl:  .  metFORMIN (GLUCOPHAGE) 500 MG tablet, Take 500 mg by mouth 2 (two) times daily with a meal., Disp: , Rfl:  .  omeprazole (PRILOSEC) 20 MG capsule, Take 20 mg by mouth daily., Disp: , Rfl:  .  oxycodone-acetaminophen (PERCOCET) 2.5-325 MG per tablet, Take 1 tablet by mouth every 4 (four) hours as needed for pain., Disp: , Rfl:     Social History: Reviewed -  reports that she has never smoked. She has never used smokeless tobacco.  Objective Findings:  Vitals: Blood pressure 120/70, weight 157 lb (71.215 kg).  Physical Examination: General appearance - alert, well appearing, and in no distress and oriented to person, place, and time Pelvic - normal external genitalia, vulva, vagina, cervix, uterus and adnexa VULVA: normal appearing vulva with no masses, tenderness or lesions  Lax introitus with complete prolapse 4-6 cm beyond introitus.  VAGINA: normal  Vagina skin with normal color , no lesions CERVIX: normal appearing cervix without discharge or lesions UTERUS: Complete uterus prolapse  ADNEXA: normal adnexa in size, nontender and no masses BLADDER: Complete prolapse GI: BM loose; no defecation difficulties; hemoccult negative rectal support is adequate for rectal function with control  Assessment & Plan:   A:  uterovaginal prolapse , complete 1. Complete uterine and bladder prolapse 2. Loose stools; no defecation difficulties 3. Hemoccult negative 4. Discussed possible treatment plans, types of surgery  P:   1. Will follow-up in 1.5 weeks about treatment plan ,  referral likely to [facility] 2. Pelvic US and Pap in 2 weeks   This chart was scribed for [name], MD by Tula Nakayama, ED Scribe. This patient was seen in room 1 and the patient's care was started at 12:12 PM.   I personally performed the services described in this documentation, which was scribed in my presence. The recorded information has been reviewed and considered accurate. It has been edited as necessary during review. [name], MD

## 2015-02-01 NOTE — Patient Instructions (Signed)
Return 2 wk for pap and ultrasound.

## 2015-02-09 ENCOUNTER — Other Ambulatory Visit: Payer: Self-pay | Admitting: Obstetrics and Gynecology

## 2015-02-09 DIAGNOSIS — N813 Complete uterovaginal prolapse: Secondary | ICD-10-CM

## 2015-02-11 ENCOUNTER — Ambulatory Visit (INDEPENDENT_AMBULATORY_CARE_PROVIDER_SITE_OTHER): Payer: Medicare Other | Admitting: Obstetrics and Gynecology

## 2015-02-11 ENCOUNTER — Ambulatory Visit (INDEPENDENT_AMBULATORY_CARE_PROVIDER_SITE_OTHER): Payer: Medicare Other

## 2015-02-11 ENCOUNTER — Other Ambulatory Visit (HOSPITAL_COMMUNITY)
Admission: RE | Admit: 2015-02-11 | Discharge: 2015-02-11 | Disposition: A | Discharge status: Home / Self Care | Payer: Medicare Other | Source: Ambulatory Visit | Attending: Obstetrics and Gynecology | Admitting: Obstetrics and Gynecology

## 2015-02-11 VITALS — BP 120/76 | HR 80 | Ht <= 58 in | Wt 156.0 lb

## 2015-02-11 DIAGNOSIS — Z1151 Encounter for screening for human papillomavirus (HPV): Secondary | ICD-10-CM | POA: Diagnosis not present

## 2015-02-11 DIAGNOSIS — N813 Complete uterovaginal prolapse: Secondary | ICD-10-CM | POA: Diagnosis not present

## 2015-02-11 DIAGNOSIS — Z124 Encounter for screening for malignant neoplasm of cervix: Secondary | ICD-10-CM | POA: Insufficient documentation

## 2015-02-11 DIAGNOSIS — Z01419 Encounter for gynecological examination (general) (routine) without abnormal findings: Secondary | ICD-10-CM

## 2015-02-11 NOTE — Progress Notes (Signed)
Patient ID: PEBBLES ZEIDERS, female   DOB: 1933-05-02, 81 y.o.   MRN: [id number] Pt here today for [place] and then see Dr. Glo Herring.    [facility] Visit  Patient name: Kimberly Best MRN [id number]  Date of birth: 01-17-1933  CC & HPI:  Kimberly Best is a 81 y.o. female presenting today for uterovaginal prolapse, scheduled for u/s , collect pap, and fit pessary  ROS:  Had pessary in past, disc type, that fell out with coughing,pt never had a Gellhorn pessary with stem.  Pertinent History Reviewed:   Reviewed: Significant for Breast cancer right, s/p 11 cancer free Medical         Past Medical History  Diagnosis Date  . Diabetes mellitus without complication   . Cancer   . Anxiety   . GERD (gastroesophageal reflux disease)                               Surgical Hx:    Past Surgical History  Procedure Laterality Date  . Breast surgery      lumpectomy    Medications: Reviewed & Updated - see associated section                       Current outpatient prescriptions:  .  alendronate (FOSAMAX) 70 MG tablet, Take 70 mg by mouth. Take with a full glass of water on an empty stomach., Disp: , Rfl:  .  ALPRAZolam (XANAX) 0.5 MG tablet, Take 0.5 mg by mouth at bedtime as needed for anxiety., Disp: , Rfl:  .  lisinopril (PRINIVIL,ZESTRIL) 2.5 MG tablet, Take 2.5 mg by mouth daily., Disp: , Rfl:  .  metFORMIN (GLUCOPHAGE) 500 MG tablet, Take 500 mg by mouth 2 (two) times daily with a meal., Disp: , Rfl:  .  omeprazole (PRILOSEC) 20 MG capsule, Take 20 mg by mouth daily., Disp: , Rfl:  .  oxycodone-acetaminophen (PERCOCET) 2.5-325 MG per tablet, Take 1 tablet by mouth every 4 (four) hours as needed for pain., Disp: , Rfl:    Social History: Reviewed -  reports that she has never smoked. She has never used smokeless tobacco.  Objective Findings:  Vitals: Blood pressure 120/76, pulse 80, height 4\' 10"  (1.473 m), weight 156 lb (70.761 kg).  Physical Examination: General appearance -  alert, well appearing, and in no distress, oriented to person, place, and time and overweight Mental status - alert, oriented to person, place, and time, normal mood, behavior, speech, dress, motor activity, and thought processes Abdomen - soft, nontender, nondistended, no masses or organomegaly Pelvic - VULVA: normal appearing vulva with no masses, tenderness or lesions, complete prolapse, VAGINA: PELVIC FLOOR EXAM: no cystocele, rectocele or prolapse noted, uterine descensus grade complete, bladder prolapse as well, CERVIX: normal appearing cervix without discharge or lesions, multiparous os, UTERUS: uterus is normal size, shape, consistency and nontender, mobile, ultrasound shows thin endometrium , ADNEXA: normal adnexa in size, nontender and no masses  Kimberly Best is a 81 y.o. G2P2, postmenopausal for a pelvic sonogram for complete uterine prolapse.  Uterus 7.5 x 3.7 x 4.8 cm, prolapsed, calcified vessels.  Endometrium 2 mm, symmetrical, moderate amount of fluid in endometrium  Right ovary Not visualized  Left ovary Not visualized    Technician Comments:  Prolapsed uterus with calcified vessels. Endometrium is thin, measuring 2 mm with a moderate amount of free fluid within the endometrium.  Bilateral ovaries are not visualized.   Georga Bora 02/11/2015 1:29 PM  Assessment & Plan:   A:  1. Complete uterovag prolapse, with some posterior support still present. P:  1. Fitted with Gellhorn 2.3/4 inch pessary with drain, which will be ordered thru [facility] Insertion next week.  P:  1. Insertion next week

## 2015-02-15 ENCOUNTER — Other Ambulatory Visit: Payer: Medicare Other

## 2015-02-15 ENCOUNTER — Other Ambulatory Visit: Payer: Medicare Other | Admitting: Obstetrics and Gynecology

## 2015-02-15 LAB — CYTOLOGY - PAP

## 2015-02-17 ENCOUNTER — Encounter: Payer: Medicare Other | Attending: Pulmonary Disease | Admitting: Nutrition

## 2015-02-17 ENCOUNTER — Other Ambulatory Visit: Payer: Medicare Other

## 2015-02-17 VITALS — Ht <= 58 in | Wt 155.0 lb

## 2015-02-17 DIAGNOSIS — Z713 Dietary counseling and surveillance: Secondary | ICD-10-CM | POA: Diagnosis not present

## 2015-02-17 DIAGNOSIS — Z6832 Body mass index (BMI) 32.0-32.9, adult: Secondary | ICD-10-CM | POA: Insufficient documentation

## 2015-02-17 DIAGNOSIS — E669 Obesity, unspecified: Secondary | ICD-10-CM | POA: Insufficient documentation

## 2015-02-17 DIAGNOSIS — E118 Type 2 diabetes mellitus with unspecified complications: Secondary | ICD-10-CM | POA: Diagnosis not present

## 2015-02-17 DIAGNOSIS — IMO0002 Reserved for concepts with insufficient information to code with codable children: Secondary | ICD-10-CM

## 2015-02-17 DIAGNOSIS — E1165 Type 2 diabetes mellitus with hyperglycemia: Secondary | ICD-10-CM

## 2015-02-17 NOTE — Patient Instructions (Signed)
Plan:  1. Eat three meals per day. 2. Make sure have 2-3 carb choices per meal 3. Eat protein with each meal. 4. Increase fresh fruits and vegetables. 5. Keep up the good work.

## 2015-02-17 NOTE — Progress Notes (Signed)
  Medical Nutrition Therapy:  Appt start time: 1100 end time:  1115  Assessment:  Primary concerns today: Diabetes. Cut out sodas or juice. Drinking more water. Didn't bring blood sugars log.  FBS 80-115 reported. Still doing chair exercises and swimming some.. Metformin 500 mg twice a day. Lost 1 lb since last visit. Diet is inconsistent with meals. Needs more CHO and fresh fruits and low carb vegetables with meals.  Preferred Learning Style:     No preference indicated   Learning Readiness:     Ready  Change in progress   MEDICATIONS: see list   DIETARY INTAKE:  24-hr recall:  B ( AM): 1 egg, 1 slice toast, 4 oz Snk ( AM):  L ( PM): string beans, mac/cheese, 1 oz chicken, water Snk ( PM):  D ( PM): 1/2 banana with bread, water Snk ( PM):  Beverages: water,    Usual physical activity: ADL's  Estimated energy needs: 1200 calories 135 g carbohydrates 90 g protein 33 g fat  Progress Towards Goal(s):  In progress.   Nutritional Diagnosis:  NB-1.1 Food and nutrition-related knowledge deficit As related to Diabetes.  As evidenced by A1c >6.5%.    Intervention:  Nutrition counseling and diabetes eduction on diet, exercise, and target ranges for blood sugars. . Plan:  1. Eat three meals per day. 2. Make sure have 2-3 carb choices per meal 3. Eat protein with each meal. 4. Increase fresh fruits and vegetables. 5. Keep up the good work. Goal: Get A1C down to 6.5% or below.  Teaching Method Utilized:  Visual Auditory Hands on  Handouts given during visit include: The Plate Method  Meal Plan Card  Barriers to learning/adherence to lifestyle change: none  Demonstrated degree of understanding via:  Teach Back   Monitoring/Evaluation:  Dietary intake, exercise, meal planning, and body weight in 3 month(s).

## 2015-02-18 ENCOUNTER — Ambulatory Visit (INDEPENDENT_AMBULATORY_CARE_PROVIDER_SITE_OTHER): Payer: Medicare Other | Admitting: Obstetrics and Gynecology

## 2015-02-18 ENCOUNTER — Encounter: Payer: Self-pay | Admitting: Obstetrics and Gynecology

## 2015-02-18 VITALS — BP 140/82 | Ht <= 58 in | Wt 154.0 lb

## 2015-02-18 DIAGNOSIS — N813 Complete uterovaginal prolapse: Secondary | ICD-10-CM | POA: Diagnosis not present

## 2015-02-18 NOTE — Progress Notes (Signed)
Patient ID: Kimberly Best, female   DOB: 1933/02/09, 81 y.o.   MRN: [id number] Pt here today for pessary insertion.

## 2015-02-18 NOTE — Progress Notes (Signed)
Patient ID: DEVONA HOLMES, female   DOB: 1933/09/30, 81 y.o.   MRN: [id number]  This chart was SCRIBED for Mallory Shirk, MD by Stephania Fragmin, ED Scribe. This patient was seen in room 1 and the patient's care was started at 3:13 PM.    [facility] Visit  Patient name: ARNETTA ODEH MRN [id number]  Date of birth: 1933-05-29  CC & HPI:  KAHLIA LAGUNES is a 81 y.o. female presenting today for pessary insertion. She has a gellhorn with drain, 2.75 pessary being inserted today.   ROS:  A complete 10 system review of systems was obtained and all systems are negative except as noted in the HPI and PMH.    Pertinent History Reviewed:   Reviewed: Significant for Medical         Past Medical History  Diagnosis Date  . Diabetes mellitus without complication   . Cancer   . Anxiety   . GERD (gastroesophageal reflux disease)                               Surgical Hx:    Past Surgical History  Procedure Laterality Date  . Breast surgery      lumpectomy    Medications: Reviewed & Updated - see associated section                       Current outpatient prescriptions:  .  alendronate (FOSAMAX) 70 MG tablet, Take 70 mg by mouth. Take with a full glass of water on an empty stomach., Disp: , Rfl:  .  ALPRAZolam (XANAX) 0.5 MG tablet, Take 0.5 mg by mouth at bedtime as needed for anxiety., Disp: , Rfl:  .  lisinopril (PRINIVIL,ZESTRIL) 2.5 MG tablet, Take 2.5 mg by mouth daily., Disp: , Rfl:  .  metFORMIN (GLUCOPHAGE) 500 MG tablet, Take 500 mg by mouth 2 (two) times daily with a meal., Disp: , Rfl:  .  omeprazole (PRILOSEC) 20 MG capsule, Take 20 mg by mouth daily., Disp: , Rfl:  .  oxycodone-acetaminophen (PERCOCET) 2.5-325 MG per tablet, Take 1 tablet by mouth every 4 (four) hours as needed for pain., Disp: , Rfl:    Social History: Reviewed -  reports that she has never smoked. She has never used smokeless tobacco.  Objective Findings:  Vitals: Blood pressure 140/82, height 4\' 10"   (1.473 m), weight 154 lb (69.854 kg).  Physical Examination: Patient here for pessary insertion.  Inserted without difficulty  Assessment & Plan:   A:  1. Pessary insertion Gellhorn 2.75 inch. 2  Atypical glandular cells on pap. Will assess after pt has had pessary in a while  P:  1. Recheck 6 wk. For colpo, ecc. Endo bx  I personally performed the services described in this documentation, which was SCRIBED in my presence. The recorded information has been reviewed and considered accurate. It has been edited as necessary during review. [name], MD

## 2015-03-01 DIAGNOSIS — G4459 Other complicated headache syndrome: Secondary | ICD-10-CM | POA: Diagnosis not present

## 2015-03-01 DIAGNOSIS — I10 Essential (primary) hypertension: Secondary | ICD-10-CM | POA: Diagnosis not present

## 2015-03-01 DIAGNOSIS — R42 Dizziness and giddiness: Secondary | ICD-10-CM | POA: Diagnosis not present

## 2015-03-10 ENCOUNTER — Ambulatory Visit (INDEPENDENT_AMBULATORY_CARE_PROVIDER_SITE_OTHER): Payer: Medicare Other | Admitting: Adult Health

## 2015-03-10 ENCOUNTER — Encounter: Payer: Self-pay | Admitting: Adult Health

## 2015-03-10 VITALS — BP 140/62 | HR 80 | Ht <= 58 in | Wt 158.0 lb

## 2015-03-10 DIAGNOSIS — N813 Complete uterovaginal prolapse: Secondary | ICD-10-CM | POA: Diagnosis not present

## 2015-03-10 DIAGNOSIS — Z4689 Encounter for fitting and adjustment of other specified devices: Secondary | ICD-10-CM

## 2015-03-10 HISTORY — DX: Encounter for fitting and adjustment of other specified devices: Z46.89

## 2015-03-10 NOTE — Patient Instructions (Signed)
Follow up prn

## 2015-03-10 NOTE — Progress Notes (Signed)
Subjective:     Patient ID: Kimberly Best, female   DOB: 1933/07/25, 81 y.o.   MRN: [id number]  HPI Kimberly Best is a 81 year old black female who is a work in for pessary reinsertion, she sneezed in the bed Monday night and it feel out and now uterus is coming out.She has pessary in zip lock bag.  Review of Systems +uterine prolapse Reviewed past medical,surgical, social and family history. Reviewed medications and allergies.     Objective:   Physical Exam BP 140/62 mmHg  Pulse 80  Ht 4\' 10"  (1.473 m)  Wt 158 lb (71.668 kg)  BMI 33.03 kg/m2Uterus protruding about 3-4 inches out of vagina, it is pink and dry.   Gellhorn 2.75 pessary with drain is cleaned and is reinserted easily.She says it feels much better now. Assessment:     Pessary reinsertion and maintenance Complete prolapse of uterus     Plan:     Follow up prn

## 2015-03-12 ENCOUNTER — Encounter: Payer: Self-pay | Admitting: Obstetrics and Gynecology

## 2015-03-12 ENCOUNTER — Ambulatory Visit (INDEPENDENT_AMBULATORY_CARE_PROVIDER_SITE_OTHER): Payer: Medicare Other | Admitting: Obstetrics and Gynecology

## 2015-03-12 VITALS — BP 140/80 | Ht <= 58 in | Wt 154.0 lb

## 2015-03-12 DIAGNOSIS — N813 Complete uterovaginal prolapse: Secondary | ICD-10-CM | POA: Diagnosis not present

## 2015-03-12 NOTE — Progress Notes (Signed)
Patient ID: Kimberly Best, female   DOB: 06-Sep-1933, 81 y.o.   MRN: [id number]    [facility] Visit  Patient name: Kimberly Best MRN [id number]  Date of birth: 11/14/1933  CC & HPI:  Kimberly Best is a 81 y.o. female presenting today for her pessary. Pt reports that her pessary fell out 2 times yesterday. She was seen on 3/30 for pessary reinsertion after pt sneezed and her device fell out.  ROS:  A complete 10 system review of systems was obtained and all systems are negative except as noted in the HPI and PMH.    Pertinent History Reviewed:   Reviewed.  Medical         Past Medical History  Diagnosis Date  . Diabetes mellitus without complication   . Cancer   . Anxiety   . GERD (gastroesophageal reflux disease)   . Arthritis   . Pessary maintenance 03/10/2015                              Surgical Hx:    Past Surgical History  Procedure Laterality Date  . Breast surgery      lumpectomy    Medications: Reviewed & Updated - see associated section                       Current outpatient prescriptions:  .  alendronate (FOSAMAX) 70 MG tablet, Take 70 mg by mouth once a week. Take with a full glass of water on an empty stomach., Disp: , Rfl:  .  ALPRAZolam (XANAX) 0.5 MG tablet, Take 0.5 mg by mouth at bedtime as needed for anxiety., Disp: , Rfl:  .  cholecalciferol (VITAMIN D) 1000 UNITS tablet, Take 1,000 Units by mouth daily., Disp: , Rfl:  .  lisinopril (PRINIVIL,ZESTRIL) 2.5 MG tablet, Take 2.5 mg by mouth daily., Disp: , Rfl:  .  metFORMIN (GLUCOPHAGE) 500 MG tablet, Take 500 mg by mouth 2 (two) times daily with a meal., Disp: , Rfl:  .  oxycodone-acetaminophen (PERCOCET) 2.5-325 MG per tablet, Take 1 tablet by mouth every 4 (four) hours as needed for pain., Disp: , Rfl:    Social History: Reviewed -  reports that she has never smoked. She has never used smokeless tobacco.  Objective Findings:  Vitals: Blood pressure 140/80, height 4\' 10"  (1.473 m), weight  154 lb (69.854 kg).  Physical Examination: General appearance - alert, well appearing, and in no distress and oriented to person, place, and time Mental status - alert, oriented to person, place, and time, normal mood, behavior, speech, dress, motor activity, and thought processes Pelvic - Complete uterine prolapse  Assessment & Plan:   A:  1. 3 in pessary inserted, will do a 1 wk trial. 2. Discussed options, including surgery or pessary placement  P:  1. Use 3 inch pessary for 1 week; follow-up in 1 week, if it is successful , will Rx a 3" pessary, if unsuccessful, consider surgical options.     This chart was scribed for [name], MD by Tula Nakayama, ED Scribe. This patient was seen in room 2 and the patient's care was started at 11:07 AM.   I personally performed the services described in this documentation, which was SCRIBED in my presence. The recorded information has been reviewed and considered accurate. It has been edited as necessary during review. [name], MD

## 2015-03-12 NOTE — Progress Notes (Signed)
Patient ID: Kimberly Best, female   DOB: Jun 16, 1933, 81 y.o.   MRN: [id number] Pt here today for pessary problems.

## 2015-03-19 ENCOUNTER — Ambulatory Visit (INDEPENDENT_AMBULATORY_CARE_PROVIDER_SITE_OTHER): Payer: Medicare Other | Admitting: Obstetrics and Gynecology

## 2015-03-19 ENCOUNTER — Encounter: Payer: Self-pay | Admitting: Obstetrics and Gynecology

## 2015-03-19 VITALS — BP 130/80 | Ht <= 58 in | Wt 158.0 lb

## 2015-03-19 DIAGNOSIS — N813 Complete uterovaginal prolapse: Secondary | ICD-10-CM

## 2015-03-19 NOTE — Progress Notes (Signed)
Patient ID: Kimberly Best, female   DOB: October 20, 1933, 81 y.o.   MRN: [id number] Pt here today for follow up. Pt states that she has not had any trouble with this pessary. Patient ID: Kimberly Best, female   DOB: July 11, 1933, 81 y.o.   MRN: [id number]  This chart was SCRIBED for Mallory Shirk, MD by Stephania Fragmin, ED Scribe. This patient was seen in room 3 and the patient's care was started at 11:33 AM.     [facility] Visit  Patient name: Kimberly Best MRN [id number]  Date of birth: [birth year]  CC & HPI:  Kimberly Best is a 81 y.o. female presenting today for follow-up of pessary insertion 7 days ago. Patient reports no problems at this time. Pt able to cough vigorously without loss of pessary, ; she does have some urine loss  Note from 03/12/15: Kimberly Best is a 81 y.o. female presenting today for her pessary. Pt reports that her pessary fell out 2 times yesterday. She was seen on 3/30 for pessary reinsertion after pt sneezed and her device fell out.   ROS:  A complete 10 system review of systems was obtained and all systems are negative except as noted in the HPI and PMH.    Pertinent History Reviewed:   Reviewed: Significant for pessary maintenance Medical         Past Medical History  Diagnosis Date  . Diabetes mellitus without complication   . Cancer   . Anxiety   . GERD (gastroesophageal reflux disease)   . Arthritis   . Pessary maintenance 03/10/2015                              Surgical Hx:    Past Surgical History  Procedure Laterality Date  . Breast surgery      lumpectomy    Medications: Reviewed & Updated - see associated section                       Current outpatient prescriptions:  .  alendronate (FOSAMAX) 70 MG tablet, Take 70 mg by mouth once a week. Take with a full glass of water on an empty stomach., Disp: , Rfl:  .  ALPRAZolam (XANAX) 0.5 MG tablet, Take 0.5 mg by mouth at bedtime as needed for anxiety., Disp: , Rfl:  .  cholecalciferol (VITAMIN  D) 1000 UNITS tablet, Take 1,000 Units by mouth daily., Disp: , Rfl:  .  lisinopril (PRINIVIL,ZESTRIL) 2.5 MG tablet, Take 2.5 mg by mouth daily., Disp: , Rfl:  .  metFORMIN (GLUCOPHAGE) 500 MG tablet, Take 500 mg by mouth 2 (two) times daily with a meal., Disp: , Rfl:  .  oxycodone-acetaminophen (PERCOCET) 2.5-325 MG per tablet, Take 1 tablet by mouth every 4 (four) hours as needed for pain., Disp: , Rfl:    Social History: Reviewed -  reports that she has never smoked. She has never used smokeless tobacco.  Objective Findings:  Vitals: Blood pressure 130/80, height 4\' 10"  (1.473 m), weight 158 lb (71.668 kg).  Physical Examination: General appearance - alert, well appearing, and in no distress, oriented to person, place, and time and normal appearing weight Mental status - alert, oriented to person, place, and time, normal mood, behavior, speech, dress, motor activity, and thought processes, affect appropriate to mood Pelvic -  Normal efg, with Gelhorn pessary 3 " in place with tip above urethral  orifice.   Assessment & Plan:   A:  1. No problems with pessary   P:  1. Order 3" gelhorn pessary with drain thru [facility]   I personally performed the services described in this documentation, which was SCRIBED in my presence. The recorded information has been reviewed and considered accurate. It has been edited as necessary during review. [name], MD

## 2015-03-19 NOTE — Progress Notes (Deleted)
See ferg's copied note

## 2015-03-25 DIAGNOSIS — H2511 Age-related nuclear cataract, right eye: Secondary | ICD-10-CM | POA: Diagnosis not present

## 2015-03-25 DIAGNOSIS — H25812 Combined forms of age-related cataract, left eye: Secondary | ICD-10-CM | POA: Diagnosis not present

## 2015-03-25 DIAGNOSIS — H04123 Dry eye syndrome of bilateral lacrimal glands: Secondary | ICD-10-CM | POA: Diagnosis not present

## 2015-03-25 DIAGNOSIS — E119 Type 2 diabetes mellitus without complications: Secondary | ICD-10-CM | POA: Diagnosis not present

## 2015-03-26 ENCOUNTER — Ambulatory Visit (INDEPENDENT_AMBULATORY_CARE_PROVIDER_SITE_OTHER): Payer: Medicare Other | Admitting: Obstetrics and Gynecology

## 2015-03-26 ENCOUNTER — Encounter: Payer: Self-pay | Admitting: Obstetrics and Gynecology

## 2015-03-26 VITALS — BP 140/80 | Ht 59.0 in | Wt 156.0 lb

## 2015-03-26 DIAGNOSIS — Z4689 Encounter for fitting and adjustment of other specified devices: Secondary | ICD-10-CM

## 2015-03-26 NOTE — Progress Notes (Signed)
Patient ID: Kimberly Best, female   DOB: 1933/11/24, 81 y.o.   MRN: [id number] Pt here today for follow up. Pt states that the pessary stayed in place. Pt denies any pain or problems with the pessary.

## 2015-03-26 NOTE — Progress Notes (Signed)
Patient ID: MANEH SIEBEN, female   DOB: [birth year], 81 y.o.   MRN: [id number]   [facility] Visit  Patient name: LYBERTI THRUSH MRN [id number]  Date of birth: 05/12/1933  CC & HPI:  FRANCHESKA VILLEDA is a 81 y.o. female presenting today for a pessary follow-up. She reports the pessary has been working Retail [employer].  ROS:  A complete 10 system review of systems was obtained and all systems are negative except as noted in the HPI and PMH.    Pertinent History Reviewed:   Reviewed: Significant for pessary insertion Medical         Past Medical History  Diagnosis Date   Diabetes mellitus without complication    Cancer    Anxiety    GERD (gastroesophageal reflux disease)    Arthritis    Pessary maintenance 03/10/2015                              Surgical Hx:    Past Surgical History  Procedure Laterality Date   Breast surgery      lumpectomy    Medications: Reviewed & Updated - see associated section                       Current outpatient prescriptions:    alendronate (FOSAMAX) 70 MG tablet, Take 70 mg by mouth once a week. Take with a full glass of water on an empty stomach., Disp: , Rfl:    ALPRAZolam (XANAX) 0.5 MG tablet, Take 0.5 mg by mouth at bedtime as needed for anxiety., Disp: , Rfl:    cholecalciferol (VITAMIN D) 1000 UNITS tablet, Take 1,000 Units by mouth daily., Disp: , Rfl:    lisinopril (PRINIVIL,ZESTRIL) 2.5 MG tablet, Take 2.5 mg by mouth daily., Disp: , Rfl:    metFORMIN (GLUCOPHAGE) 500 MG tablet, Take 500 mg by mouth 2 (two) times daily with a meal., Disp: , Rfl:    oxycodone-acetaminophen (PERCOCET) 2.5-325 MG per tablet, Take 1 tablet by mouth every 4 (four) hours as needed for pain., Disp: , Rfl:    Social History: Reviewed -  reports that she has never smoked. She has never used smokeless tobacco.  Objective Findings:  Vitals: Blood pressure 140/80, height 4\' 11"  (1.499 m), weight 156 lb (70.761 kg).  Physical Examination:   Patient here for discussion only.    Assessment & Plan:   A:  1.  3 " Pessary working beautifully  P:  1. Follow-up prn  This chart was SCRIBED for Mallory Shirk, MD by Stephania Fragmin, ED Scribe. This patient was seen in room 1 and the patient's care was started at 12:27 PM.   I personally performed the services described in this documentation, which was SCRIBED in my presence. The recorded information has been reviewed and considered accurate. It has been edited as necessary during review. [name], MD

## 2015-03-31 ENCOUNTER — Telehealth: Payer: Self-pay | Admitting: Obstetrics and Gynecology

## 2015-03-31 NOTE — Telephone Encounter (Signed)
Pt called wanting to know if she needed to keep her appointment for Friday for an endometrial biopsy. I spoke with Dr. Glo Herring and he advised that the pt did not need to keep the appointment. I called and left a message on the pt's machine stating that the appointment would be cancelled and to call our office back if she had any questions.

## 2015-04-01 ENCOUNTER — Other Ambulatory Visit: Payer: Medicare Other | Admitting: Obstetrics and Gynecology

## 2015-04-19 DIAGNOSIS — H2512 Age-related nuclear cataract, left eye: Secondary | ICD-10-CM | POA: Diagnosis not present

## 2015-04-19 DIAGNOSIS — H268 Other specified cataract: Secondary | ICD-10-CM | POA: Diagnosis not present

## 2015-04-27 ENCOUNTER — Ambulatory Visit (HOSPITAL_COMMUNITY)
Admission: RE | Admit: 2015-04-27 | Discharge: 2015-04-27 | Disposition: A | Discharge status: Home / Self Care | Payer: Medicare Other | Source: Ambulatory Visit | Attending: Pulmonary Disease | Admitting: Pulmonary Disease

## 2015-04-27 ENCOUNTER — Other Ambulatory Visit (HOSPITAL_COMMUNITY): Payer: Self-pay | Admitting: Pulmonary Disease

## 2015-04-27 DIAGNOSIS — K210 Gastro-esophageal reflux disease with esophagitis: Secondary | ICD-10-CM | POA: Diagnosis not present

## 2015-04-27 DIAGNOSIS — E119 Type 2 diabetes mellitus without complications: Secondary | ICD-10-CM | POA: Diagnosis not present

## 2015-04-27 DIAGNOSIS — M79605 Pain in left leg: Secondary | ICD-10-CM | POA: Insufficient documentation

## 2015-04-27 DIAGNOSIS — Z853 Personal history of malignant neoplasm of breast: Secondary | ICD-10-CM | POA: Insufficient documentation

## 2015-04-27 DIAGNOSIS — F419 Anxiety disorder, unspecified: Secondary | ICD-10-CM | POA: Diagnosis not present

## 2015-04-27 DIAGNOSIS — M545 Low back pain: Secondary | ICD-10-CM | POA: Diagnosis not present

## 2015-05-04 DIAGNOSIS — H2511 Age-related nuclear cataract, right eye: Secondary | ICD-10-CM | POA: Diagnosis not present

## 2015-05-10 DIAGNOSIS — H25811 Combined forms of age-related cataract, right eye: Secondary | ICD-10-CM | POA: Diagnosis not present

## 2015-05-10 DIAGNOSIS — H2511 Age-related nuclear cataract, right eye: Secondary | ICD-10-CM | POA: Diagnosis not present

## 2015-05-20 ENCOUNTER — Ambulatory Visit: Payer: Medicare Other | Admitting: Nutrition

## 2015-06-28 ENCOUNTER — Encounter: Payer: Medicare Other | Attending: Pulmonary Disease | Admitting: Nutrition

## 2015-06-28 ENCOUNTER — Encounter: Payer: Self-pay | Admitting: Nutrition

## 2015-06-28 VITALS — Ht 59.0 in | Wt 159.0 lb

## 2015-06-28 DIAGNOSIS — Z713 Dietary counseling and surveillance: Secondary | ICD-10-CM | POA: Insufficient documentation

## 2015-06-28 DIAGNOSIS — E119 Type 2 diabetes mellitus without complications: Secondary | ICD-10-CM

## 2015-06-28 DIAGNOSIS — Z6832 Body mass index (BMI) 32.0-32.9, adult: Secondary | ICD-10-CM | POA: Diagnosis not present

## 2015-06-28 DIAGNOSIS — E669 Obesity, unspecified: Secondary | ICD-10-CM | POA: Diagnosis not present

## 2015-06-28 NOTE — Patient Instructions (Signed)
.   Plan:  Keep up th good job!! 1. Eat three meals per day. 2. Make sure have 2-3 carb choices per meal 3. Increase exercise to 30 minutes at the [facility] 3 times a week. 4. Keep up the good work. Goal: Get A1C down to 6.0% or below.

## 2015-06-28 NOTE — Progress Notes (Signed)
  Medical Nutrition Therapy:  Appt start time: 1100 end time:  1115  Assessment:  Primary concerns today: Diabetes. A1C 6.2%. She has had her cataracts removed and can now drive. Working on increasing her physical activity. Weight is up 3 lbs. She is willing to resume exercising at the [facility] now. She was told she doesn't have a blood clot in her leg. Eating 2-3 meals per day and drinking water. Lost her husband 1  Year ago and has been a little down from that but overall feels good. BS log brought in/ FBS 90-100's in the am. Needs to work on increasing physical activity. Still on Metformin 500 mg BID.  Preferred Learning Style:     No preference indicated   Learning Readiness:     Ready  Change in progress   MEDICATIONS: see list   DIETARY INTAKE:  24-hr recall:  B ( AM): Oatmeal  1 cup, and 1 ww toast, water, OR  Snk ( AM):  L ( PM): Green beans, creamed potatoes. water Snk ( PM):  D ( PM): Ate late dinner so didn't eat much since it was Sunday. Snk ( PM):  Beverages: water,   Usual physical activity: ADL's  Estimated energy needs: 1200 calories 135 g carbohydrates 90 g protein 33 g fat  Progress Towards Goal(s):  In progress.   Nutritional Diagnosis:  NB-1.1 Food and nutrition-related knowledge deficit As related to Diabetes.  As evidenced by A1c >6.5%.    Intervention:  Nutrition counseling and diabetes eduction on diet, exercise, and target ranges for blood sugars. . Plan:  Keep up th good job!! 1. Eat three meals per day. 2. Make sure have 2-3 carb choices per meal 3. Increase exercise to 30 minutes at the [facility] 3 times a week. 4. Keep up the good work. Goal: Get A1C down to 6.0% or below.  Teaching Method Utilized:  Visual Auditory Hands on  Handouts given during visit include: The Plate Method  Meal Plan Card  Barriers to learning/adherence to lifestyle change: none  Demonstrated degree of understanding via:  Teach Back   Monitoring/Evaluation:   Dietary intake, exercise, meal planning, and body weight in 3 month(s).

## 2015-07-07 ENCOUNTER — Telehealth: Payer: Self-pay | Admitting: Obstetrics and Gynecology

## 2015-07-07 NOTE — Telephone Encounter (Signed)
Patient has not been seen for colposcopy and endometrial biopsy for AGC on last pap in March. I will schedule the patient for colposcopy with ECC and endometrial biopsy. Pt was told today that eval of abnormal pap IS requred

## 2015-07-16 ENCOUNTER — Encounter: Payer: Self-pay | Admitting: Obstetrics and Gynecology

## 2015-07-16 ENCOUNTER — Other Ambulatory Visit (HOSPITAL_COMMUNITY)
Admission: RE | Admit: 2015-07-16 | Discharge: 2015-07-16 | Disposition: A | Discharge status: Home / Self Care | Payer: Medicare Other | Source: Ambulatory Visit | Attending: Obstetrics and Gynecology | Admitting: Obstetrics and Gynecology

## 2015-07-16 ENCOUNTER — Ambulatory Visit (INDEPENDENT_AMBULATORY_CARE_PROVIDER_SITE_OTHER): Payer: Medicare Other | Admitting: Obstetrics and Gynecology

## 2015-07-16 VITALS — BP 150/70 | HR 84 | Ht <= 58 in | Wt 160.0 lb

## 2015-07-16 DIAGNOSIS — R87619 Unspecified abnormal cytological findings in specimens from cervix uteri: Secondary | ICD-10-CM | POA: Diagnosis not present

## 2015-07-16 DIAGNOSIS — N72 Inflammatory disease of cervix uteri: Secondary | ICD-10-CM | POA: Diagnosis not present

## 2015-07-16 NOTE — Progress Notes (Addendum)
Patient ID: Kimberly Best, female   DOB: 1933-07-27, 81 y.o.   MRN: [id number]  CC: pap showed AGC. Pt has pessary. HPI Comments:  Patient denies any trouble with her pessary.   Kimberly Best 81 y.o. [id number] here for colposcopy for glandular cell abnormality (AGUS) pap smear on 02/11/15.  Discussed role for HPV in cervical dysplasia, need for surveillance.  Patient given informed consent, signed copy in the chart, time out was performed.  Placed in lithotomy position. Cervix viewed with speculum and colposcope after application of acetic acid.   Colposcopy adequate? Yes  Irritated exaggerated vascularity, erythematous; biopsies obtained at endocervix.   ECC specimen obtained. Efforts at sounding the cervix to allow endometrial biopsy unsuccessful due to cervical stenosis All specimens were labelled and sent to pathology.  Transvaginal U/S done to assess endometium , and shows: Small fluid-filled  Pocket of fluid in the endometrial cavity with thin 1 mm endo on each side of endometrial cavity. NO polyps or abnormal masses noted.Marland Kitchen Uterus is tiny and mobile, with thin endometrium, <1 mm   Colposcopy IMPRESSION: Cervicitis due to pessary use.    Endometrial biopsy not performed.     This chart was SCRIBED for Mallory Shirk, MD by Stephania Fragmin, ED Scribe. This patient was seen in room 2, and the patient's care was started at 12:59 PM.  I personally performed the services described in this documentation, which was SCRIBED in my presence. The recorded information has been reviewed and considered accurate. It has been edited as necessary during review. [name], MD

## 2015-07-27 ENCOUNTER — Other Ambulatory Visit (HOSPITAL_COMMUNITY): Payer: Self-pay | Admitting: Pulmonary Disease

## 2015-07-27 DIAGNOSIS — Z1231 Encounter for screening mammogram for malignant neoplasm of breast: Secondary | ICD-10-CM

## 2015-08-17 ENCOUNTER — Telehealth: Payer: Self-pay | Admitting: *Deleted

## 2015-08-17 NOTE — Telephone Encounter (Signed)
Pt informed of non-diagnostic EDD per Dr. Glo Herring, repeat pap in  6 mos. Pt verbalized understanding.

## 2015-08-17 NOTE — Telephone Encounter (Signed)
-----   Message from [name], MD sent at 08/15/2015  9:50 PM EDT ----- Non-diagnostic ECC sample, nothing but mucus. Somewhat reassuring. Repeat pap @ 6 months. Please put on recall list

## 2015-08-23 ENCOUNTER — Ambulatory Visit (HOSPITAL_COMMUNITY)
Admission: RE | Admit: 2015-08-23 | Discharge: 2015-08-23 | Disposition: A | Discharge status: Home / Self Care | Payer: Medicare Other | Source: Ambulatory Visit | Attending: Pulmonary Disease | Admitting: Pulmonary Disease

## 2015-08-23 DIAGNOSIS — Z1231 Encounter for screening mammogram for malignant neoplasm of breast: Secondary | ICD-10-CM | POA: Insufficient documentation

## 2015-08-24 DIAGNOSIS — C50919 Malignant neoplasm of unspecified site of unspecified female breast: Secondary | ICD-10-CM | POA: Diagnosis not present

## 2015-08-24 DIAGNOSIS — Z23 Encounter for immunization: Secondary | ICD-10-CM | POA: Diagnosis not present

## 2015-08-24 DIAGNOSIS — Z0000 Encounter for general adult medical examination without abnormal findings: Secondary | ICD-10-CM | POA: Diagnosis not present

## 2015-08-24 DIAGNOSIS — E1165 Type 2 diabetes mellitus with hyperglycemia: Secondary | ICD-10-CM | POA: Diagnosis not present

## 2015-08-24 DIAGNOSIS — F419 Anxiety disorder, unspecified: Secondary | ICD-10-CM | POA: Diagnosis not present

## 2015-08-24 DIAGNOSIS — K210 Gastro-esophageal reflux disease with esophagitis: Secondary | ICD-10-CM | POA: Diagnosis not present

## 2015-08-24 DIAGNOSIS — M545 Low back pain: Secondary | ICD-10-CM | POA: Diagnosis not present

## 2015-09-29 ENCOUNTER — Ambulatory Visit: Payer: Medicare Other | Admitting: Nutrition

## 2015-10-15 ENCOUNTER — Encounter: Payer: Medicare Other | Attending: Pulmonary Disease | Admitting: Nutrition

## 2015-10-15 ENCOUNTER — Encounter: Payer: Self-pay | Admitting: Nutrition

## 2015-10-15 VITALS — Ht <= 58 in | Wt 162.0 lb

## 2015-10-15 DIAGNOSIS — Z6833 Body mass index (BMI) 33.0-33.9, adult: Secondary | ICD-10-CM | POA: Diagnosis not present

## 2015-10-15 DIAGNOSIS — Z713 Dietary counseling and surveillance: Secondary | ICD-10-CM | POA: Insufficient documentation

## 2015-10-15 DIAGNOSIS — E119 Type 2 diabetes mellitus without complications: Secondary | ICD-10-CM

## 2015-10-15 DIAGNOSIS — E669 Obesity, unspecified: Secondary | ICD-10-CM | POA: Diagnosis not present

## 2015-10-15 NOTE — Progress Notes (Signed)
  Medical Nutrition Therapy:  Appt start time: 1100 end time:  1115  Assessment:  Primary concerns today: Diabetes. A1C 6.2%. Gained 2 lbs.Went to [facility] yesterday for the first time.Wants to start back into the Silver [employer]ne[employer] for exercise. BS in the 80-90l's. Denies low blood sugars. Helping taking care of great grandkids.  Has some issues with her left leg issues and will go see Dr.  Luan Pulling or orthopedic for that.   Still on Metformin 500 mg BID. Feels good.  FBS are all less than 100 mg/dl in am.   Diet needs a little more low carb veggies. Walking 30 minutes 3-5 times per week would help her lose a few pounds that she gained recently. Feet look to be a little swollen and she may be retaining some fluid.  Wt Readings from Last 3 Encounters:  10/15/15 162 lb (73.483 kg)  07/16/15 160 lb (72.576 kg)  06/28/15 159 lb (72.122 kg)   Ht Readings from Last 3 Encounters:  10/15/15 4\' 10"  (1.473 m)  07/16/15 4\' 10"  (1.473 m)  06/28/15 4\' 11"  (1.499 m)   Body mass index is 33.87 kg/(m^2).  Preferred Learning Style:     No preference indicated   Learning Readiness:     Ready  Change in progress   MEDICATIONS: see list   DIETARY INTAKE:  24-hr recall:  B ( AM): Cherrios or Special K or Oatmeal  1 cup,  Or eggs,and 1 ww toast, water, OR  Snk ( AM):  L ( PM):  Chicken salad 1/3 c, crackers, grapes, ater Snk ( PM):  D ( PM): Beef hotdog and chili/onions,  Water   Snk ( PM):  Beverages: water,   Usual physical activity: ADL's  Estimated energy needs: 1200 calories 135 g carbohydrates 90 g protein 33 g fat  Progress Towards Goal(s):  In progress.   Nutritional Diagnosis:  NB-1.1 Food and nutrition-related knowledge deficit As related to Diabetes.  As evidenced by A1c >6.5%.    Intervention:  Nutrition counseling and diabetes eduction on diet, exercise, and target ranges for blood sugars. . Plan:  Keep up th good job!! 1. Eat three meals per day. 2. Make sure have 2-3  carb choices per meal 3. Increase exercise to 30 minutes at the [facility] 3 times a week. 4. Keep up the good work.   Get A1C down to 6.0% or below.  Teaching Method Utilized:  Visual Auditory Hands on  Handouts given during visit include: The Plate Method  Meal Plan Card  Barriers to learning/adherence to lifestyle change: none  Demonstrated degree of understanding via:  Teach Back   Monitoring/Evaluation:  Dietary intake, exercise, meal planning, and body weight in  PRN.

## 2015-10-15 NOTE — Patient Instructions (Signed)
.   Plan:  Keep up th good job!! 1. Eat three meals per day. 2. Make sure have 2-3 carb choices per meal 3. Increase exercise to 30 minutes at the [facility] 3 times a week. 4. Keep up the good work.

## 2015-11-19 DIAGNOSIS — E119 Type 2 diabetes mellitus without complications: Secondary | ICD-10-CM | POA: Diagnosis not present

## 2015-11-19 DIAGNOSIS — H04123 Dry eye syndrome of bilateral lacrimal glands: Secondary | ICD-10-CM | POA: Diagnosis not present

## 2015-11-19 DIAGNOSIS — Z961 Presence of intraocular lens: Secondary | ICD-10-CM | POA: Diagnosis not present

## 2015-11-30 ENCOUNTER — Ambulatory Visit (HOSPITAL_COMMUNITY)
Admission: RE | Admit: 2015-11-30 | Discharge: 2015-11-30 | Disposition: A | Discharge status: Home / Self Care | Payer: Medicare Other | Source: Ambulatory Visit | Attending: Pulmonary Disease | Admitting: Pulmonary Disease

## 2015-11-30 ENCOUNTER — Other Ambulatory Visit (HOSPITAL_COMMUNITY): Payer: Self-pay | Admitting: Pulmonary Disease

## 2015-11-30 DIAGNOSIS — M160 Bilateral primary osteoarthritis of hip: Secondary | ICD-10-CM | POA: Diagnosis not present

## 2015-11-30 DIAGNOSIS — K210 Gastro-esophageal reflux disease with esophagitis: Secondary | ICD-10-CM | POA: Diagnosis not present

## 2015-11-30 DIAGNOSIS — M25552 Pain in left hip: Secondary | ICD-10-CM

## 2015-11-30 DIAGNOSIS — M25551 Pain in right hip: Secondary | ICD-10-CM | POA: Insufficient documentation

## 2015-11-30 DIAGNOSIS — M1611 Unilateral primary osteoarthritis, right hip: Secondary | ICD-10-CM | POA: Diagnosis not present

## 2015-11-30 DIAGNOSIS — E119 Type 2 diabetes mellitus without complications: Secondary | ICD-10-CM | POA: Diagnosis not present

## 2015-11-30 DIAGNOSIS — M545 Low back pain: Secondary | ICD-10-CM | POA: Diagnosis not present

## 2015-11-30 DIAGNOSIS — F419 Anxiety disorder, unspecified: Secondary | ICD-10-CM | POA: Diagnosis not present

## 2016-03-23 ENCOUNTER — Ambulatory Visit: Payer: Medicare Other | Admitting: Obstetrics and Gynecology

## 2016-04-07 ENCOUNTER — Other Ambulatory Visit (HOSPITAL_COMMUNITY)
Admission: RE | Admit: 2016-04-07 | Discharge: 2016-04-07 | Disposition: A | Discharge status: Home / Self Care | Payer: Medicare Other | Source: Ambulatory Visit | Attending: Obstetrics and Gynecology | Admitting: Obstetrics and Gynecology

## 2016-04-07 ENCOUNTER — Ambulatory Visit (INDEPENDENT_AMBULATORY_CARE_PROVIDER_SITE_OTHER): Payer: Medicare Other | Admitting: Obstetrics and Gynecology

## 2016-04-07 ENCOUNTER — Encounter: Payer: Self-pay | Admitting: Obstetrics and Gynecology

## 2016-04-07 VITALS — BP 130/80 | Ht <= 58 in | Wt 160.0 lb

## 2016-04-07 DIAGNOSIS — Z01411 Encounter for gynecological examination (general) (routine) with abnormal findings: Secondary | ICD-10-CM | POA: Diagnosis not present

## 2016-04-07 DIAGNOSIS — R87610 Atypical squamous cells of undetermined significance on cytologic smear of cervix (ASC-US): Secondary | ICD-10-CM | POA: Diagnosis not present

## 2016-04-07 DIAGNOSIS — R87619 Unspecified abnormal cytological findings in specimens from cervix uteri: Secondary | ICD-10-CM | POA: Diagnosis not present

## 2016-04-07 DIAGNOSIS — Z1151 Encounter for screening for human papillomavirus (HPV): Secondary | ICD-10-CM | POA: Insufficient documentation

## 2016-04-07 NOTE — Progress Notes (Signed)
Patient ID: Kimberly Best, female   DOB: 11-15-1933, 82 y.o.   MRN: [id number]   [facility] Visit  @DATE @            Patient name: Kimberly Best MRN [id number]  Date of birth: 09/10/1933 CC & HPI:  Kimberly Best is a 82 y.o. female presenting today for f/u on an abnormal pap smear completed 6 months ago which showed atypical glandular cells on cervical Pap smear.   Pt also complains of vaginal odor resulting from pessary placement. Pt reports she takes 4-5 showers a day in order to avoid smelling bad.   ROS:  Review of Systems  Genitourinary:       + vaginal odor   All other systems reviewed and are negative.  Pertinent History Reviewed:   Reviewed: Significant for DM, breast cancer, pessary maintenance Medical         Past Medical History  Diagnosis Date  . Diabetes mellitus without complication ([place])   . Cancer ([place])   . Anxiety   . GERD (gastroesophageal reflux disease)   . Arthritis   . Pessary maintenance 03/10/2015                              Surgical Hx:    Past Surgical History  Procedure Laterality Date  . Breast surgery      lumpectomy    Medications: Reviewed & Updated - see associated section                       Current outpatient prescriptions:  .  alendronate (FOSAMAX) 70 MG tablet, Take 70 mg by mouth once a week. Take with a full glass of water on an empty stomach., Disp: , Rfl:  .  ALPRAZolam (XANAX) 0.5 MG tablet, Take 0.5 mg by mouth at bedtime as needed for anxiety., Disp: , Rfl:  .  aspirin 325 MG tablet, Take 325 mg by mouth as needed., Disp: , Rfl:  .  cholecalciferol (VITAMIN D) 1000 UNITS tablet, Take 1,000 Units by mouth daily., Disp: , Rfl:  .  lisinopril (PRINIVIL,ZESTRIL) 2.5 MG tablet, Take 2.5 mg by mouth daily., Disp: , Rfl:  .  metFORMIN (GLUCOPHAGE) 500 MG tablet, Take 500 mg by mouth 2 (two) times daily with a meal., Disp: , Rfl:  .  oxycodone-acetaminophen (PERCOCET) 2.5-325 MG per tablet, Take 1 tablet by mouth every 4  (four) hours as needed for pain., Disp: , Rfl:    Social History: Reviewed -  reports that she has never smoked. She has never used smokeless tobacco.  Objective Findings:  Vitals: Blood pressure 130/80, height 4\' 10"  (1.473 m), weight 160 lb (72.576 kg). Physical Examination: General appearance - alert, well appearing, and in no distress and oriented to person, place, and time Pelvic - normal external genitalia, vulva, vagina, cervix, uterus and adnexa, VULVA: normal appearing vulva with no masses, tenderness or lesions,  VAGINA: normal appearing vagina with normal color and discharge, no lesions,  UTERUS: second degree uterine descensus; pessary in place removed, pap collected, and pessary reinserted.  Assessment & Plan:   A:  1. 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  2. Complete uterine prolapse.  3. Pap collected for abnormal pap smear.   P:  1. F/u in one month for consideration of vaginal hysterectomy. Or change in therapy     By signing my name below, I, Terressa Koyanagi,  attest that this documentation has been prepared under the direction and in the presence of Mallory Shirk, MD. Electronically Signed: Terressa Koyanagi, ED Scribe. 04/07/2016. 9:44 AM.   I personally performed the services described in this documentation, which was SCRIBED in my presence. The recorded information has been reviewed and considered accurate. It has been edited as necessary during review. [name], MD

## 2016-04-11 LAB — CYTOLOGY - PAP

## 2016-04-21 LAB — BASIC METABOLIC PANEL: GLUCOSE: 49 mg/dL

## 2016-05-05 ENCOUNTER — Ambulatory Visit (INDEPENDENT_AMBULATORY_CARE_PROVIDER_SITE_OTHER): Payer: Medicare Other | Admitting: Obstetrics and Gynecology

## 2016-05-05 ENCOUNTER — Encounter: Payer: Self-pay | Admitting: Obstetrics and Gynecology

## 2016-05-05 VITALS — BP 130/62 | Ht <= 58 in | Wt 160.0 lb

## 2016-05-05 DIAGNOSIS — N813 Complete uterovaginal prolapse: Secondary | ICD-10-CM

## 2016-05-05 DIAGNOSIS — Z4689 Encounter for fitting and adjustment of other specified devices: Secondary | ICD-10-CM | POA: Diagnosis not present

## 2016-05-05 NOTE — Progress Notes (Signed)
[facility] Visit  05/05/2016            Patient name: Kimberly Best MRN [id number]  Date of birth: 1933-08-27  CC & HPI:  Kimberly Best is a 82 y.o. female presenting today for follow up in regards to pap smear results. Pt reports that she has had the pessary in for a month this time and there have been no issues with her recent pessary. Pt voices that she would like to have the pessary checked every couple months for maintenance. Pt denies any other symptoms.    ROS:  ROS A complete 10 system review of systems was obtained and all systems are negative except as noted in the HPI and PMH.   Pertinent History Reviewed:   Reviewed: Significant for pessary maintenance Medical         Past Medical History  Diagnosis Date  . Diabetes mellitus without complication ([place])   . Cancer ([place])   . Anxiety   . GERD (gastroesophageal reflux disease)   . Arthritis   . Pessary maintenance 03/10/2015                              Surgical Hx:    Past Surgical History  Procedure Laterality Date  . Breast surgery      lumpectomy    Medications: Reviewed & Updated - see associated section                       Current outpatient prescriptions:  .  alendronate (FOSAMAX) 70 MG tablet, Take 70 mg by mouth once a week. Take with a full glass of water on an empty stomach., Disp: , Rfl:  .  ALPRAZolam (XANAX) 0.5 MG tablet, Take 0.5 mg by mouth at bedtime as needed for anxiety., Disp: , Rfl:  .  aspirin 325 MG tablet, Take 325 mg by mouth as needed., Disp: , Rfl:  .  cholecalciferol (VITAMIN D) 1000 UNITS tablet, Take 1,000 Units by mouth daily., Disp: , Rfl:  .  lisinopril (PRINIVIL,ZESTRIL) 2.5 MG tablet, Take 2.5 mg by mouth daily., Disp: , Rfl:  .  metFORMIN (GLUCOPHAGE) 500 MG tablet, Take 500 mg by mouth 2 (two) times daily with a meal., Disp: , Rfl:  .  oxycodone-acetaminophen (PERCOCET) 2.5-325 MG per tablet, Take 1 tablet by mouth every 4 (four) hours as needed for pain., Disp: ,  Rfl:    Social History: Reviewed -  reports that she has never smoked. She has never used smokeless tobacco.  Objective Findings:  Vitals: Blood pressure 130/62, height 4\' 10"  (1.473 m), weight 160 lb (72.576 kg).   Discussed with pt risks and benefits of pessary maintenance and consideration of vaginal hysterectomy. At end of discussion, pt had opportunity to ask questions and has no further questions at this time.   Greater than 50% was spent in counseling and coordination of care with the patient. Total time greater than: 15 minutes   Assessment & Plan:   A:  1. Complete uterine prolapse.    P:  1. Follow up in 2 months for pessary maintenance    By signing my name below, I, Soijett Blue, attest that this documentation has been prepared under the direction and in the presence of [name], MD. Electronically Signed: Soijett Blue, ED Scribe. 05/05/2016. 9:22 AM.  I personally performed the services described in this documentation, which was  SCRIBED in my presence. The recorded information has been reviewed and considered accurate. It has been edited as necessary during review. [name], MD

## 2016-05-15 DIAGNOSIS — R21 Rash and other nonspecific skin eruption: Secondary | ICD-10-CM | POA: Diagnosis not present

## 2016-06-27 DIAGNOSIS — M545 Low back pain: Secondary | ICD-10-CM | POA: Diagnosis not present

## 2016-06-27 DIAGNOSIS — K210 Gastro-esophageal reflux disease with esophagitis: Secondary | ICD-10-CM | POA: Diagnosis not present

## 2016-06-27 DIAGNOSIS — E119 Type 2 diabetes mellitus without complications: Secondary | ICD-10-CM | POA: Diagnosis not present

## 2016-06-27 DIAGNOSIS — M1990 Unspecified osteoarthritis, unspecified site: Secondary | ICD-10-CM | POA: Diagnosis not present

## 2016-06-28 DIAGNOSIS — E785 Hyperlipidemia, unspecified: Secondary | ICD-10-CM | POA: Diagnosis not present

## 2016-06-28 DIAGNOSIS — K210 Gastro-esophageal reflux disease with esophagitis: Secondary | ICD-10-CM | POA: Diagnosis not present

## 2016-06-28 DIAGNOSIS — M545 Low back pain: Secondary | ICD-10-CM | POA: Diagnosis not present

## 2016-06-28 DIAGNOSIS — M1990 Unspecified osteoarthritis, unspecified site: Secondary | ICD-10-CM | POA: Diagnosis not present

## 2016-06-28 DIAGNOSIS — E119 Type 2 diabetes mellitus without complications: Secondary | ICD-10-CM | POA: Diagnosis not present

## 2016-07-05 ENCOUNTER — Ambulatory Visit (INDEPENDENT_AMBULATORY_CARE_PROVIDER_SITE_OTHER): Payer: Medicare Other | Admitting: Obstetrics and Gynecology

## 2016-07-05 ENCOUNTER — Encounter: Payer: Self-pay | Admitting: Obstetrics and Gynecology

## 2016-07-05 VITALS — BP 130/70 | Ht <= 58 in | Wt 159.0 lb

## 2016-07-05 DIAGNOSIS — Z4689 Encounter for fitting and adjustment of other specified devices: Secondary | ICD-10-CM

## 2016-07-05 DIAGNOSIS — N813 Complete uterovaginal prolapse: Secondary | ICD-10-CM | POA: Diagnosis not present

## 2016-07-05 NOTE — Progress Notes (Signed)
Pt here today for pessary cleaning. Pt denies any problems or concerns at this time.

## 2016-07-05 NOTE — Progress Notes (Signed)
Patient ID: Kimberly Best, female   DOB: Apr 07, 1933, 82 y.o.   MRN: [id number]    [facility] Visit  @DATE @            Patient name: Kimberly Best MRN [id number]  Date of birth: 1933-07-26  CC & HPI:  Kimberly Best is a 82 y.o. female presenting today for pessary maintenance. Pt is currently fitted with a #3 Gellhorn pessary. She states she has not attempted to remove the pessary at home for cleaning.   ROS:  Review of Systems  Constitutional:       No complaints; encounter for pessary maintenance.    Pertinent History Reviewed:   Reviewed: Significant for 3" pessary  Medical         Past Medical History:  Diagnosis Date   Anxiety    Arthritis    Cancer ([place])    Diabetes mellitus without complication ([place])    GERD (gastroesophageal reflux disease)    Pessary maintenance 03/10/2015                              Surgical Hx:    Past Surgical History:  Procedure Laterality Date   BREAST SURGERY     lumpectomy    Medications: Reviewed & Updated - see associated section                       Current Outpatient Prescriptions:    alendronate (FOSAMAX) 70 MG tablet, Take 70 mg by mouth once a week. Take with a full glass of water on an empty stomach., Disp: , Rfl:    ALPRAZolam (XANAX) 0.5 MG tablet, Take 0.5 mg by mouth at bedtime as needed for anxiety., Disp: , Rfl:    aspirin 325 MG tablet, Take 325 mg by mouth as needed., Disp: , Rfl:    cholecalciferol (VITAMIN D) 1000 UNITS tablet, Take 1,000 Units by mouth daily., Disp: , Rfl:    lisinopril (PRINIVIL,ZESTRIL) 2.5 MG tablet, Take 2.5 mg by mouth daily., Disp: , Rfl:    metFORMIN (GLUCOPHAGE) 500 MG tablet, Take 500 mg by mouth 2 (two) times daily with a meal., Disp: , Rfl:    oxycodone-acetaminophen (PERCOCET) 2.5-325 MG per tablet, Take 1 tablet by mouth every 4 (four) hours as needed for pain., Disp: , Rfl:    Social History: Reviewed -  reports that she has never smoked. She has never used  smokeless tobacco.  Objective Findings:  Vitals: Blood pressure 130/70, height 4\' 10"  (1.473 m), weight 159 lb (72.1 kg).  Physical Examination: General appearance - alert, well appearing, and in no distress Abdomen - soft, nontender, nondistended, no masses or organomegaly Pelvic -  VULVA: normal appearing vulva with no masses, tenderness or lesions,  VAGINA: normal appearing vagina with normal color and discharge, no lesions, light spotting d/t trauma of removal of pessary; moderate discharge in vault, removed  CERVIX: normal appearing cervix without discharge or lesions, Musculoskeletal - no joint tenderness, deformity or swelling Extremities - peripheral pulses normal, no pedal edema, no clubbing or cyanosis Skin - normal coloration and turgor, no rashes, no suspicious skin lesions noted   Assessment & Plan:   A:  1. Encounter for pessary maintenance  2. Complete uterine prolapse  3. Pessary cleaned and reinserted with estrace   P:  1. F/u in 3 months for pessary maintenance  2. Pt sent home with PRN estrace cream  By signing my name below, I, Hansel Feinstein, attest that this documentation has been prepared under the direction and in the presence of [name], MD. Electronically Signed: Hansel Feinstein, ED Scribe. 07/05/16. 8:51 AM.  I personally performed the services described in this documentation, which was SCRIBED in my presence. The recorded information has been reviewed and considered accurate. It has been edited as necessary during review. [name], MD

## 2016-07-18 ENCOUNTER — Other Ambulatory Visit (HOSPITAL_COMMUNITY): Payer: Self-pay | Admitting: Pulmonary Disease

## 2016-07-18 DIAGNOSIS — Z1231 Encounter for screening mammogram for malignant neoplasm of breast: Secondary | ICD-10-CM

## 2016-09-01 ENCOUNTER — Ambulatory Visit (HOSPITAL_COMMUNITY)
Admission: RE | Admit: 2016-09-01 | Discharge: 2016-09-01 | Disposition: A | Discharge status: Home / Self Care | Payer: Medicare Other | Source: Ambulatory Visit | Attending: Pulmonary Disease | Admitting: Pulmonary Disease

## 2016-09-01 DIAGNOSIS — Z1231 Encounter for screening mammogram for malignant neoplasm of breast: Secondary | ICD-10-CM | POA: Insufficient documentation

## 2016-10-05 ENCOUNTER — Encounter: Payer: Self-pay | Admitting: Obstetrics and Gynecology

## 2016-10-05 ENCOUNTER — Encounter (INDEPENDENT_AMBULATORY_CARE_PROVIDER_SITE_OTHER): Payer: Self-pay

## 2016-10-05 ENCOUNTER — Ambulatory Visit (INDEPENDENT_AMBULATORY_CARE_PROVIDER_SITE_OTHER): Payer: Medicare Other | Admitting: Obstetrics and Gynecology

## 2016-10-05 VITALS — BP 148/78 | HR 72 | Wt 159.8 lb

## 2016-10-05 DIAGNOSIS — Z4689 Encounter for fitting and adjustment of other specified devices: Secondary | ICD-10-CM

## 2016-10-05 DIAGNOSIS — N813 Complete uterovaginal prolapse: Secondary | ICD-10-CM

## 2016-10-05 NOTE — Progress Notes (Signed)
[facility] Visit  10/05/16            Patient name: Kimberly Best MRN [id number]  Date of birth: May 31, 1933  CC & HPI:  Kimberly Best is a 82 y.o. female presenting today for pessary maintenance. She was last seen on 07/05/16 and was advised to come in after 3 months. She denies any problems with her pessary.    ROS:  ROS Negative at this visit   Pertinent History Reviewed:   Reviewed: Significant for CA, DM Medical         Past Medical History:  Diagnosis Date  . Anxiety   . Arthritis   . Cancer ([place])   . Diabetes mellitus without complication ([place])   . GERD (gastroesophageal reflux disease)   . Pessary maintenance 03/10/2015                              Surgical Hx:    Past Surgical History:  Procedure Laterality Date  . BREAST SURGERY     lumpectomy    Medications: Reviewed & Updated - see associated section                       Current Outpatient Prescriptions:  .  alendronate (FOSAMAX) 70 MG tablet, Take 70 mg by mouth once a week. Take with a full glass of water on an empty stomach., Disp: , Rfl:  .  ALPRAZolam (XANAX) 0.5 MG tablet, Take 0.5 mg by mouth at bedtime as needed for anxiety., Disp: , Rfl:  .  aspirin 325 MG tablet, Take 325 mg by mouth as needed., Disp: , Rfl:  .  cholecalciferol (VITAMIN D) 1000 UNITS tablet, Take 1,000 Units by mouth daily., Disp: , Rfl:  .  lisinopril (PRINIVIL,ZESTRIL) 2.5 MG tablet, Take 2.5 mg by mouth daily., Disp: , Rfl:  .  meclizine (ANTIVERT) 25 MG tablet, , Disp: , Rfl:  .  metFORMIN (GLUCOPHAGE) 500 MG tablet, Take 500 mg by mouth 2 (two) times daily with a meal., Disp: , Rfl:  .  oxycodone-acetaminophen (PERCOCET) 2.5-325 MG per tablet, Take 1 tablet by mouth every 4 (four) hours as needed for pain., Disp: , Rfl:    Social History: Reviewed -  reports that she has never smoked. She has never used smokeless tobacco.  Objective Findings:  Vitals: Blood pressure (!) 148/78, pulse 72, weight 159 lb 12.8 oz (72.5  kg).  Physical Examination: General appearance - alert, well appearing, and in no distress Mental status - alert, oriented to person, place, and time Pelvic -  VULVA: normal appearing vulva with no masses, tenderness or lesions,  VAGINA: normal appearing vagina with normal color and discharge, no lesions,  CERVIX: hidden by pessary, moderate amount of discharge being managed with extrace UTERUS: uterus is normal size, shape, consistency and nontender,  ADNEXA: normal adnexa in size, nontender and no masses   Assessment & Plan:   A:  1. Uterine prolapse with pessary in place 2 pessary maintenance  P:  1. Premarin vaginal cream two times a week 2. Follow up in 3 months     By signing my name below, I, Sonum Patel, attest that this documentation has been prepared under the direction and in the presence of [name], MD. Electronically Signed: Sonum Patel, Education administrator. 10/05/16. 9:54 AM.  I personally performed the services described in this documentation, which was SCRIBED in my  presence. The recorded information has been reviewed and considered accurate. It has been edited as necessary during review. [name], MD

## 2016-11-29 NOTE — Congregational Nurse Program (Signed)
Congregational Nurse Program Note  Date of Encounter: 11/28/2016  Past Medical History: Past Medical History:  Diagnosis Date  . Anxiety   . Arthritis   . Cancer ([place])   . Diabetes mellitus without complication ([place])   . GERD (gastroesophageal reflux disease)   . Pessary maintenance 03/10/2015    Encounter Details:     CNP Questionnaire - 11/28/16 1100      Patient Demographics   Is this a new or existing patient? New   Patient is considered a/an Not Applicable   Race African-American/Black     Patient Assistance   Location of Patient Assistance [facility], [place]   Patient's financial/insurance status Low Income;Medicare   Uninsured Patient ([employer]) No   Patient referred to apply for the following financial assistance Not Applicable   Food insecurities addressed Referred to food bank or resource   Transportation assistance No   Assistance securing medications No   Educational Programmer, multimedia the healthcare system     Encounter Details   Primary purpose of visit [facility]   Was an Emergency Department visit averted? Not Applicable   Does patient have a medical provider? Yes   Patient referred to Not Applicable   Was a mental health screening completed? (GAINS tool) No   Does patient have dental issues? No   Does patient have vision issues? No   Does your patient have an abnormal blood pressure today? No   Since previous encounter, have you referred patient for abnormal blood pressure that resulted in a new diagnosis or medication change? No   Does your patient have an abnormal blood glucose today? No   Since previous encounter, have you referred patient for abnormal blood glucose that resulted in a new diagnosis or medication change? No   Was there a life-saving intervention made? No     Client encountered during blood pressure screenings at the [facility]. Client has a primary Care provider.

## 2017-01-05 ENCOUNTER — Ambulatory Visit: Payer: Medicare Other | Admitting: Obstetrics and Gynecology

## 2017-03-28 DIAGNOSIS — J309 Allergic rhinitis, unspecified: Secondary | ICD-10-CM | POA: Diagnosis not present

## 2017-03-28 DIAGNOSIS — M545 Low back pain: Secondary | ICD-10-CM | POA: Diagnosis not present

## 2017-03-28 DIAGNOSIS — M25552 Pain in left hip: Secondary | ICD-10-CM | POA: Diagnosis not present

## 2017-03-28 DIAGNOSIS — E119 Type 2 diabetes mellitus without complications: Secondary | ICD-10-CM | POA: Diagnosis not present

## 2017-03-28 DIAGNOSIS — K210 Gastro-esophageal reflux disease with esophagitis: Secondary | ICD-10-CM | POA: Diagnosis not present

## 2017-08-10 ENCOUNTER — Other Ambulatory Visit (HOSPITAL_COMMUNITY): Payer: Self-pay | Admitting: Pulmonary Disease

## 2017-08-10 DIAGNOSIS — Z1231 Encounter for screening mammogram for malignant neoplasm of breast: Secondary | ICD-10-CM

## 2017-09-03 ENCOUNTER — Ambulatory Visit (HOSPITAL_COMMUNITY)
Admission: RE | Admit: 2017-09-03 | Discharge: 2017-09-03 | Disposition: A | Discharge status: Home / Self Care | Payer: Medicare Other | Source: Ambulatory Visit | Attending: Pulmonary Disease | Admitting: Pulmonary Disease

## 2017-09-03 DIAGNOSIS — Z1231 Encounter for screening mammogram for malignant neoplasm of breast: Secondary | ICD-10-CM | POA: Diagnosis not present

## 2017-09-27 DIAGNOSIS — K210 Gastro-esophageal reflux disease with esophagitis: Secondary | ICD-10-CM | POA: Diagnosis not present

## 2017-09-27 DIAGNOSIS — M810 Age-related osteoporosis without current pathological fracture: Secondary | ICD-10-CM | POA: Diagnosis not present

## 2017-09-27 DIAGNOSIS — Z23 Encounter for immunization: Secondary | ICD-10-CM | POA: Diagnosis not present

## 2017-09-27 DIAGNOSIS — E1165 Type 2 diabetes mellitus with hyperglycemia: Secondary | ICD-10-CM | POA: Diagnosis not present

## 2017-09-27 DIAGNOSIS — R739 Hyperglycemia, unspecified: Secondary | ICD-10-CM | POA: Diagnosis not present

## 2017-09-27 DIAGNOSIS — Z0000 Encounter for general adult medical examination without abnormal findings: Secondary | ICD-10-CM | POA: Diagnosis not present

## 2017-11-13 DIAGNOSIS — H26493 Other secondary cataract, bilateral: Secondary | ICD-10-CM | POA: Diagnosis not present

## 2017-11-13 DIAGNOSIS — H04123 Dry eye syndrome of bilateral lacrimal glands: Secondary | ICD-10-CM | POA: Diagnosis not present

## 2017-11-13 DIAGNOSIS — Z961 Presence of intraocular lens: Secondary | ICD-10-CM | POA: Diagnosis not present

## 2017-11-13 DIAGNOSIS — E119 Type 2 diabetes mellitus without complications: Secondary | ICD-10-CM | POA: Diagnosis not present

## 2017-12-19 DIAGNOSIS — Z961 Presence of intraocular lens: Secondary | ICD-10-CM | POA: Diagnosis not present

## 2017-12-19 DIAGNOSIS — H26492 Other secondary cataract, left eye: Secondary | ICD-10-CM | POA: Diagnosis not present

## 2018-03-09 LAB — GLUCOSE, POCT (MANUAL RESULT ENTRY): POC Glucose: 74 mg/dl (ref 70–99)

## 2018-03-28 DIAGNOSIS — E1169 Type 2 diabetes mellitus with other specified complication: Secondary | ICD-10-CM | POA: Diagnosis not present

## 2018-03-28 DIAGNOSIS — I10 Essential (primary) hypertension: Secondary | ICD-10-CM | POA: Diagnosis not present

## 2018-03-28 DIAGNOSIS — Z79891 Long term (current) use of opiate analgesic: Secondary | ICD-10-CM | POA: Diagnosis not present

## 2018-03-28 DIAGNOSIS — K210 Gastro-esophageal reflux disease with esophagitis: Secondary | ICD-10-CM | POA: Diagnosis not present

## 2018-03-28 DIAGNOSIS — M545 Low back pain: Secondary | ICD-10-CM | POA: Diagnosis not present

## 2018-06-05 ENCOUNTER — Encounter: Payer: Self-pay | Admitting: Obstetrics and Gynecology

## 2018-06-05 ENCOUNTER — Ambulatory Visit (INDEPENDENT_AMBULATORY_CARE_PROVIDER_SITE_OTHER): Payer: Medicare Other | Admitting: Obstetrics and Gynecology

## 2018-06-05 VITALS — BP 110/69 | HR 81 | Ht <= 58 in | Wt 166.2 lb

## 2018-06-05 DIAGNOSIS — Z4689 Encounter for fitting and adjustment of other specified devices: Secondary | ICD-10-CM | POA: Diagnosis not present

## 2018-06-05 NOTE — Progress Notes (Signed)
Patient ID: Kimberly Best, female   DOB: Nov 16, 1933, 84 y.o.   MRN: [id number]    [facility] Visit  @DATE @            Patient name: Kimberly Best MRN [id number]  Date of birth: Jan 09, 1933  CC & HPI:  Kimberly Best is a 84 y.o. female presenting today for a pessary check. She has been doing well and has not had any issues with the pessary. She denies fever, chills, and any other symptoms or complaints at this time.  ROS:  ROS - fever - chills All systems are negative except as noted in the HPI and PMH.   Pertinent History Reviewed:   Reviewed: Significant for pessary Medical         Past Medical History:  Diagnosis Date  . Anxiety   . Arthritis   . Cancer ([place])   . Diabetes mellitus without complication ([place])   . GERD (gastroesophageal reflux disease)   . Pessary maintenance 03/10/2015                              Surgical Hx:    Past Surgical History:  Procedure Laterality Date  . BREAST SURGERY     lumpectomy    Medications: Reviewed & Updated - see associated section                       Current Outpatient Medications:  .  alendronate (FOSAMAX) 70 MG tablet, Take 70 mg by mouth once a week. Take with a full glass of water on an empty stomach., Disp: , Rfl:  .  ALPRAZolam (XANAX) 0.5 MG tablet, Take 0.5 mg by mouth at bedtime as needed for anxiety., Disp: , Rfl:  .  aspirin 325 MG tablet, Take 325 mg by mouth as needed., Disp: , Rfl:  .  cholecalciferol (VITAMIN D) 1000 UNITS tablet, Take 1,000 Units by mouth daily., Disp: , Rfl:  .  lisinopril (PRINIVIL,ZESTRIL) 2.5 MG tablet, Take 2.5 mg by mouth daily., Disp: , Rfl:  .  meclizine (ANTIVERT) 25 MG tablet, , Disp: , Rfl:  .  metFORMIN (GLUCOPHAGE) 500 MG tablet, Take 500 mg by mouth 2 (two) times daily with a meal., Disp: , Rfl:  .  oxycodone-acetaminophen (PERCOCET) 2.5-325 MG per tablet, Take 1 tablet by mouth every 4 (four) hours as needed for pain., Disp: , Rfl:    Social History: Reviewed -  reports  that she has never smoked. She has never used smokeless tobacco.  Objective Findings:  Vitals: Blood pressure 110/69, pulse 81, height 4\' 10"  (1.473 m), weight 166 lb 3.2 oz (75.4 kg).  PHYSICAL EXAMINATION General appearance - alert, well appearing, and in no distress, oriented to person, place, and time and normal appearing weight Mental status - alert, oriented to person, place, and time, normal mood, behavior, speech, dress, motor activity, and thought processes, affect appropriate to mood  PELVIC Vagina - normal discharge, pessary in place, no stricture , no d/c abnormality.  Assessment & Plan:   A:  1.  Pessary Check  P:  1.  F/u 1 year    By signing my name below, I, De Burrs, attest that this documentation has been prepared under the direction and in the presence of [name], MD. Electronically Signed: De Burrs, Medical Scribe. 06/05/18. 11:10 AM.  I personally performed the services described in this documentation, which was  SCRIBED in my presence. The recorded information has been reviewed and considered accurate. It has been edited as necessary during review. [name], MD

## 2018-08-07 ENCOUNTER — Other Ambulatory Visit (HOSPITAL_COMMUNITY): Payer: Self-pay | Admitting: Pulmonary Disease

## 2018-08-07 DIAGNOSIS — Z1231 Encounter for screening mammogram for malignant neoplasm of breast: Secondary | ICD-10-CM

## 2018-08-26 ENCOUNTER — Encounter: Payer: Self-pay | Admitting: Obstetrics and Gynecology

## 2018-08-26 ENCOUNTER — Ambulatory Visit: Payer: Medicare Other | Admitting: Obstetrics and Gynecology

## 2018-08-26 VITALS — BP 114/69 | HR 90 | Ht <= 58 in | Wt 168.0 lb

## 2018-08-26 DIAGNOSIS — N895 Stricture and atresia of vagina: Secondary | ICD-10-CM

## 2018-08-26 DIAGNOSIS — Z4689 Encounter for fitting and adjustment of other specified devices: Secondary | ICD-10-CM

## 2018-08-26 DIAGNOSIS — T192XXD Foreign body in vulva and vagina, subsequent encounter: Secondary | ICD-10-CM

## 2018-08-26 NOTE — Progress Notes (Signed)
[facility] Visit  08/26/2018           Patient name: Kimberly Best MRN [id number]  Date of birth: [birth year]  CC & HPI:  Kimberly Best is a 84 y.o. female presenting today for a pessary cleaning. She notes that she doesn't take out her pessary at all due to her arthritis in her hands. Pt hasn't tried any medications for her symptoms. She denies any other symptoms. She denies any surgical procedures in the past.  She has a vaginal d/c . Denies bleeding ROS:  ROS  -fever -chills -urinary issues All systems are negative except as noted in the HPI and PMH.     Pertinent History Reviewed:   Reviewed: Significant for pessary maintenance, DM Medical         Past Medical History:  Diagnosis Date  . Anxiety   . Arthritis   . Cancer ([place])   . Diabetes mellitus without complication ([place])   . GERD (gastroesophageal reflux disease)   . Pessary maintenance 03/10/2015                              Surgical Hx:    Past Surgical History:  Procedure Laterality Date  . BREAST SURGERY     lumpectomy    Medications: Reviewed & Updated - see associated section                       Current Outpatient Medications:  .  alendronate (FOSAMAX) 70 MG tablet, Take 70 mg by mouth once a week. Take with a full glass of water on an empty stomach., Disp: , Rfl:  .  ALPRAZolam (XANAX) 0.5 MG tablet, Take 0.5 mg by mouth at bedtime as needed for anxiety., Disp: , Rfl:  .  aspirin 325 MG tablet, Take 325 mg by mouth as needed., Disp: , Rfl:  .  cholecalciferol (VITAMIN D) 1000 UNITS tablet, Take 1,000 Units by mouth daily., Disp: , Rfl:  .  lisinopril (PRINIVIL,ZESTRIL) 2.5 MG tablet, Take 2.5 mg by mouth daily., Disp: , Rfl:  .  meclizine (ANTIVERT) 25 MG tablet, , Disp: , Rfl:  .  metFORMIN (GLUCOPHAGE) 500 MG tablet, Take 500 mg by mouth 2 (two) times daily with a meal., Disp: , Rfl:  .  oxycodone-acetaminophen (PERCOCET) 2.5-325 MG per tablet, Take 1 tablet by mouth every 4 (four) hours as  needed for pain., Disp: , Rfl:    Social History: Reviewed -  reports that she has never smoked. She has never used smokeless tobacco.  Objective Findings:  Vitals: Blood pressure 114/69, pulse 90, height 4\' 10"  (1.473 m), weight 168 lb (76.2 kg).  PHYSICAL EXAMINATION General appearance - alert, well appearing, and in no distress and oriented to person, place, and time, obese Mental status - alert, oriented to person, place, and time, normal mood, behavior, speech, dress, motor activity, and thought processes  PELVIC External genitalia: Normal Vagina: Narrowed vagina. Vagina with moderate discharge and fibrosis noted beneath pessary entrapping the pessary. Efforts to extract with ring forceps, or speculum, are unsuccessful on multiple tries.   PESSARY CLEANING AND REMOVAL PROCEDURE NOTE  The patient presented today for a pessary check and cleaning. Multiple attempts at removal with speculum and without speculum unsuccessful. Will require surgical removal.     Assessment & Plan:   A:  1.  Impacted pessary  2. Vaginal stenosis   P:  1. Will schedule the patient for the OR for an exam under anesthesia and pessary removal. May require cutting pessary apart to remove it. 2. Given Premarin cream today (2 tubes)    By signing my name below, I, Soijett Blue, attest that this documentation has been prepared under the direction and in the presence of [name], MD. Electronically Signed: Soijett Blue, Medical Scribe. 08/26/18. 10:24 AM.  I personally performed the services described in this documentation, which was SCRIBED in my presence. The recorded information has been reviewed and considered accurate. It has been edited as necessary during review. [name], MD

## 2018-08-28 ENCOUNTER — Other Ambulatory Visit: Payer: Self-pay | Admitting: Obstetrics and Gynecology

## 2018-08-28 NOTE — Patient Instructions (Signed)
Kimberly Best  08/28/2018     @PREFPERIOPPHARMACY @   Your procedure is scheduled on  09/03/2018 .  Report to [facility] at  830   A.M.  Call this number if you have problems the morning of surgery:  [phone]   Remember:  Do not eat or drink after midnight.  You may drink clear liquids until  12 midnight 09/02/2018.  Clear liquids allowed are:                    Water, Juice (non-citric and without pulp), Carbonated beverages, Clear Tea, Black Coffee only, Plain Jell-O only, Gatorade and Plain Popsicles only    Take these medicines the morning of surgery with A SIP OF WATER Xanax( if needed), lisinopril, antivert, oxycodone( if needed).    Do not wear jewelry, make-up or nail polish.  Do not wear lotions, powders, or perfumes, or deodorant.  Do not shave 48 hours prior to surgery.  Men may shave face and neck.  Do not bring valuables to the hospital.  [facility] is not responsible for any belongings or valuables.  Contacts, dentures or bridgework may not be worn into surgery.  Leave your suitcase in the car.  After surgery it may be brought to your room.  For patients admitted to the hospital, discharge time will be determined by your treatment team.  Patients discharged the day of surgery will not be allowed to drive home.   Name and phone number of your driver:   family Special instructions:  Follow any prep instructions given to you by Dr Glo Herring.  Please read over the following fact sheets that you were given. Anesthesia Post-op Instructions and Care and Recovery After Surgery       General Anesthesia, Adult General anesthesia is the use of medicines to make a person "go to sleep" (be unconscious) for a medical procedure. General anesthesia is often recommended when a procedure:  Is long.  Requires you to be still or in an unusual position.  Is major and can cause you to lose blood.  Is impossible to do without general anesthesia.  The medicines  used for general anesthesia are called general anesthetics. In addition to making you sleep, the medicines:  Prevent pain.  Control your blood pressure.  Relax your muscles.  Tell a health care provider about:  Any allergies you have.  All medicines you are taking, including vitamins, herbs, eye drops, creams, and over-the-counter medicines.  Any problems you or family members have had with anesthetic medicines.  Types of anesthetics you have had in the past.  Any bleeding disorders you have.  Any surgeries you have had.  Any medical conditions you have.  Any history of heart or lung conditions, such as heart failure, sleep apnea, or chronic obstructive pulmonary disease (COPD).  Whether you are pregnant or may be pregnant.  Whether you use tobacco, alcohol, marijuana, or street drugs.  Any history of Armed forces logistics/support/administrative officer.  Any history of depression or anxiety. What are the risks? Generally, this is a safe procedure. However, problems may occur, including:  Allergic reaction to anesthetics.  Lung and heart problems.  Inhaling food or liquids from your stomach into your lungs (aspiration).  Injury to nerves.  Waking up during your procedure and being unable to move (rare).  Extreme agitation or a state of mental confusion (delirium) when you wake up from the anesthetic.  Air in the bloodstream, which can lead to  stroke.  These problems are more likely to develop if you are having a major surgery or if you have an advanced medical condition. You can prevent some of these complications by answering all of your health care provider's questions thoroughly and by following all pre-procedure instructions. General anesthesia can cause side effects, including:  Nausea or vomiting  A sore throat from the breathing tube.  Feeling cold or shivery.  Feeling tired, washed out, or achy.  Sleepiness or drowsiness.  Confusion or agitation.  What happens before the  procedure? Staying hydrated Follow instructions from your health care provider about hydration, which may include:  Up to 2 hours before the procedure - you may continue to drink clear liquids, such as water, clear fruit juice, black coffee, and plain tea.  Eating and drinking restrictions Follow instructions from your health care provider about eating and drinking, which may include:  8 hours before the procedure - stop eating heavy meals or foods such as meat, fried foods, or fatty foods.  6 hours before the procedure - stop eating light meals or foods, such as toast or cereal.  6 hours before the procedure - stop drinking milk or drinks that contain milk.  2 hours before the procedure - stop drinking clear liquids.  Medicines  Ask your health care provider about: ? Changing or stopping your regular medicines. This is especially important if you are taking diabetes medicines or blood thinners. ? Taking medicines such as aspirin and ibuprofen. These medicines can thin your blood. Do not take these medicines before your procedure if your health care provider instructs you not to. ? Taking new dietary supplements or medicines. Do not take these during the week before your procedure unless your health care provider approves them.  If you are told to take a medicine or to continue taking a medicine on the day of the procedure, take the medicine with sips of water. General instructions   Ask if you will be going home the same day, the following day, or after a longer hospital stay. ? Plan to have someone take you home. ? Plan to have someone stay with you for the first 24 hours after you leave the hospital or clinic.  For 3-6 weeks before the procedure, try not to use any tobacco products, such as cigarettes, chewing tobacco, and e-cigarettes.  You may brush your teeth on the morning of the procedure, but make sure to spit out the toothpaste. What happens during the procedure?  You  will be given anesthetics through a mask and through an IV tube in one of your veins.  You may receive medicine to help you relax (sedative).  As soon as you are asleep, a breathing tube may be used to help you breathe.  An anesthesia specialist will stay with you throughout the procedure. He or she will help keep you comfortable and safe by continuing to give you medicines and adjusting the amount of medicine that you get. He or she will also watch your blood pressure, pulse, and oxygen levels to make sure that the anesthetics do not cause any problems.  If a breathing tube was used to help you breathe, it will be removed before you wake up. The procedure may vary among health care providers and hospitals. What happens after the procedure?  You will wake up, often slowly, after the procedure is complete, usually in a recovery area.  Your blood pressure, heart rate, breathing rate, and blood oxygen level will be monitored  until the medicines you were given have worn off.  You may be given medicine to help you calm down if you feel anxious or agitated.  If you will be going home the same day, your health care provider may check to make sure you can stand, drink, and urinate.  Your health care providers will treat your pain and side effects before you go home.  Do not drive for 24 hours if you received a sedative.  You may: ? Feel nauseous and vomit. ? Have a sore throat. ? Have mental slowness. ? Feel cold or shivery. ? Feel sleepy. ? Feel tired. ? Feel sore or achy, even in parts of your body where you did not have surgery. This information is not intended to replace advice given to you by your health care provider. Make sure you discuss any questions you have with your health care provider. Document Released: 03/05/2008 Document Revised: 05/09/2016 Document Reviewed: 11/11/2015 Elsevier Interactive Patient Education  2018 [place] Anesthesia, Adult, Care After These  instructions provide you with information about caring for yourself after your procedure. Your health care provider may also give you more specific instructions. Your treatment has been planned according to current medical practices, but problems sometimes occur. Call your health care provider if you have any problems or questions after your procedure. What can I expect after the procedure? After the procedure, it is common to have:  Vomiting.  A sore throat.  Mental slowness.  It is common to feel:  Nauseous.  Cold or shivery.  Sleepy.  Tired.  Sore or achy, even in parts of your body where you did not have surgery.  Follow these instructions at home: For at least 24 hours after the procedure:  Do not: ? Participate in activities where you could fall or become injured. ? Drive. ? Use heavy machinery. ? Drink alcohol. ? Take sleeping pills or medicines that cause drowsiness. ? Make important decisions or sign legal documents. ? Take care of children on your own.  Rest. Eating and drinking  If you vomit, drink water, juice, or soup when you can drink without vomiting.  Drink enough fluid to keep your urine clear or pale yellow.  Make sure you have little or no nausea before eating solid foods.  Follow the diet recommended by your health care provider. General instructions  Have a responsible adult stay with you until you are awake and alert.  Return to your normal activities as told by your health care provider. Ask your health care provider what activities are safe for you.  Take over-the-counter and prescription medicines only as told by your health care provider.  If you smoke, do not smoke without supervision.  Keep all follow-up visits as told by your health care provider. This is important. Contact a health care provider if:  You continue to have nausea or vomiting at home, and medicines are not helpful.  You cannot drink fluids or start eating  again.  You cannot urinate after 8-12 hours.  You develop a skin rash.  You have fever.  You have increasing redness at the site of your procedure. Get help right away if:  You have difficulty breathing.  You have chest pain.  You have unexpected bleeding.  You feel that you are having a life-threatening or urgent problem. This information is not intended to replace advice given to you by your health care provider. Make sure you discuss any questions you have with your health care provider.  Document Released: 03/05/2001 Document Revised: 05/01/2016 Document Reviewed: 11/11/2015 Elsevier Interactive Patient Education  [employer].

## 2018-08-30 ENCOUNTER — Encounter (HOSPITAL_COMMUNITY)
Admission: RE | Admit: 2018-08-30 | Discharge: 2018-08-30 | Disposition: A | Discharge status: Home / Self Care | Payer: Medicare Other | Source: Ambulatory Visit | Attending: Obstetrics and Gynecology | Admitting: Obstetrics and Gynecology

## 2018-08-30 ENCOUNTER — Other Ambulatory Visit: Payer: Self-pay

## 2018-08-30 ENCOUNTER — Encounter (HOSPITAL_COMMUNITY): Payer: Self-pay

## 2018-08-30 DIAGNOSIS — R9431 Abnormal electrocardiogram [ECG] [EKG]: Secondary | ICD-10-CM | POA: Diagnosis not present

## 2018-08-30 DIAGNOSIS — Z01818 Encounter for other preprocedural examination: Secondary | ICD-10-CM | POA: Diagnosis not present

## 2018-08-30 DIAGNOSIS — I444 Left anterior fascicular block: Secondary | ICD-10-CM | POA: Diagnosis not present

## 2018-08-30 LAB — COMPREHENSIVE METABOLIC PANEL
ALBUMIN: 3.7 g/dL (ref 3.5–5.0)
ALK PHOS: 44 U/L (ref 38–126)
ALT: 11 U/L (ref 0–44)
AST: 21 U/L (ref 15–41)
Anion gap: 11 (ref 5–15)
BUN: 12 mg/dL (ref 8–23)
CO2: 25 mmol/L (ref 22–32)
Calcium: 9 mg/dL (ref 8.9–10.3)
Chloride: 104 mmol/L (ref 98–111)
Creatinine, Ser: 0.73 mg/dL (ref 0.44–1.00)
GFR calc Af Amer: >60 mL/min (ref >60)
GFR calc non Af Amer: >60 mL/min (ref >60)
GLUCOSE: 173 mg/dL — AB (ref 70–99)
POTASSIUM: 3.9 mmol/L (ref 3.5–5.1)
Sodium: 140 mmol/L (ref 135–145)
Total Bilirubin: 0.8 mg/dL (ref 0.3–1.2)
Total Protein: 6.9 g/dL (ref 6.5–8.1)

## 2018-08-30 LAB — URINALYSIS, ROUTINE W REFLEX MICROSCOPIC
Bilirubin Urine: NEGATIVE
Glucose, UA: NEGATIVE mg/dL
KETONES UR: NEGATIVE mg/dL
Nitrite: NEGATIVE
PROTEIN: NEGATIVE mg/dL
Specific Gravity, Urine: 1.014 (ref 1.005–1.030)
WBC, UA: >50 WBC/hpf — ABNORMAL HIGH (ref 0–5)
pH: 5 (ref 5.0–8.0)

## 2018-08-30 LAB — CBC
HCT: 30.9 % — ABNORMAL LOW (ref 36.0–46.0)
HEMOGLOBIN: 9.9 g/dL — AB (ref 12.0–15.0)
MCH: 28 pg (ref 26.0–34.0)
MCHC: 32 g/dL (ref 30.0–36.0)
MCV: 87.3 fL (ref 78.0–100.0)
Platelets: 264 10*3/uL (ref 150–400)
RBC: 3.54 MIL/uL — ABNORMAL LOW (ref 3.87–5.11)
RDW: 15 % (ref 11.5–15.5)
WBC: 5 10*3/uL (ref 4.0–10.5)

## 2018-08-30 LAB — GLUCOSE, CAPILLARY: GLUCOSE-CAPILLARY: 134 mg/dL — AB (ref 70–99)

## 2018-08-30 LAB — HEMOGLOBIN A1C
Hgb A1c MFr Bld: 6.1 % — ABNORMAL HIGH (ref 4.8–5.6)
Mean Plasma Glucose: 128.37 mg/dL

## 2018-08-30 NOTE — Pre-Procedure Instructions (Signed)
HgbA1C routed to PCP. 

## 2018-09-03 ENCOUNTER — Ambulatory Visit (HOSPITAL_COMMUNITY): Payer: Medicare Other | Admitting: Anesthesiology

## 2018-09-03 ENCOUNTER — Encounter (HOSPITAL_COMMUNITY): Payer: Self-pay | Admitting: Anesthesiology

## 2018-09-03 ENCOUNTER — Encounter (HOSPITAL_COMMUNITY)
Admission: RE | Disposition: A | Discharge status: Home / Self Care | Payer: Self-pay | Source: Ambulatory Visit | Attending: Obstetrics and Gynecology

## 2018-09-03 ENCOUNTER — Ambulatory Visit (HOSPITAL_COMMUNITY)
Admission: RE | Admit: 2018-09-03 | Discharge: 2018-09-03 | Disposition: A | Discharge status: Home / Self Care | Payer: Medicare Other | Source: Ambulatory Visit | Attending: Obstetrics and Gynecology | Admitting: Obstetrics and Gynecology

## 2018-09-03 DIAGNOSIS — N895 Stricture and atresia of vagina: Secondary | ICD-10-CM | POA: Diagnosis not present

## 2018-09-03 DIAGNOSIS — Z880 Allergy status to penicillin: Secondary | ICD-10-CM | POA: Diagnosis not present

## 2018-09-03 DIAGNOSIS — M1990 Unspecified osteoarthritis, unspecified site: Secondary | ICD-10-CM | POA: Insufficient documentation

## 2018-09-03 DIAGNOSIS — Z853 Personal history of malignant neoplasm of breast: Secondary | ICD-10-CM | POA: Diagnosis not present

## 2018-09-03 DIAGNOSIS — K219 Gastro-esophageal reflux disease without esophagitis: Secondary | ICD-10-CM | POA: Diagnosis not present

## 2018-09-03 DIAGNOSIS — T839XXA Unspecified complication of genitourinary prosthetic device, implant and graft, initial encounter: Secondary | ICD-10-CM | POA: Diagnosis present

## 2018-09-03 DIAGNOSIS — F419 Anxiety disorder, unspecified: Secondary | ICD-10-CM | POA: Diagnosis not present

## 2018-09-03 DIAGNOSIS — Z4689 Encounter for fitting and adjustment of other specified devices: Secondary | ICD-10-CM | POA: Insufficient documentation

## 2018-09-03 DIAGNOSIS — E119 Type 2 diabetes mellitus without complications: Secondary | ICD-10-CM | POA: Diagnosis not present

## 2018-09-03 DIAGNOSIS — T192XXD Foreign body in vulva and vagina, subsequent encounter: Secondary | ICD-10-CM

## 2018-09-03 LAB — GLUCOSE, CAPILLARY: GLUCOSE-CAPILLARY: 104 mg/dL — AB (ref 70–99)

## 2018-09-03 SURGERY — EXAM UNDER ANESTHESIA
Anesthesia: General

## 2018-09-03 MED ORDER — EPHEDRINE SULFATE 50 MG/ML IJ SOLN
INTRAMUSCULAR | Status: AC
  Filled 2018-09-03: qty 1

## 2018-09-03 MED ORDER — PROMETHAZINE HCL 25 MG/ML IJ SOLN: 6.2500 mg | INTRAMUSCULAR | Status: DC | PRN

## 2018-09-03 MED ORDER — LIDOCAINE HCL (PF) 1 % IJ SOLN
INTRAMUSCULAR | Status: AC
  Filled 2018-09-03: qty 30

## 2018-09-03 MED ORDER — PROPOFOL 10 MG/ML IV BOLUS
INTRAVENOUS | Status: AC
  Filled 2018-09-03: qty 20

## 2018-09-03 MED ORDER — LACTATED RINGERS IV SOLN
INTRAVENOUS | Status: DC
  Administered 2018-09-03 (×2): via INTRAVENOUS

## 2018-09-03 MED ORDER — BUPIVACAINE-EPINEPHRINE (PF) 0.25% -1:200000 IJ SOLN
INTRAMUSCULAR | Status: AC
  Filled 2018-09-03: qty 30

## 2018-09-03 MED ORDER — LIDOCAINE-EPINEPHRINE (PF) 1 %-1:200000 IJ SOLN
INTRAMUSCULAR | Status: AC
  Filled 2018-09-03: qty 30

## 2018-09-03 MED ORDER — FENTANYL CITRATE (PF) 100 MCG/2ML IJ SOLN
INTRAMUSCULAR | Status: AC
  Filled 2018-09-03: qty 2

## 2018-09-03 MED ORDER — LIDOCAINE HCL (PF) 1 % IJ SOLN
INTRAMUSCULAR | Status: AC
  Filled 2018-09-03: qty 5

## 2018-09-03 MED ORDER — MEPERIDINE HCL 50 MG/ML IJ SOLN: 6.2500 mg | INTRAMUSCULAR | Status: DC | PRN

## 2018-09-03 MED ORDER — ESTROGENS, CONJUGATED 0.625 MG/GM VA CREA: 1.0000 | TOPICAL_CREAM | VAGINAL | 2 refills | Status: DC

## 2018-09-03 MED ORDER — HYDROCODONE-ACETAMINOPHEN 7.5-325 MG PO TABS: 1.0000 | ORAL_TABLET | Freq: Once | ORAL | Status: DC | PRN

## 2018-09-03 MED ORDER — LACTATED RINGERS IV SOLN: INTRAVENOUS | Status: DC

## 2018-09-03 MED ORDER — HYDROMORPHONE HCL 1 MG/ML IJ SOLN: 0.2500 mg | INTRAMUSCULAR | Status: DC | PRN

## 2018-09-03 MED ORDER — LIDOCAINE HCL (CARDIAC) PF 100 MG/5ML IV SOSY
PREFILLED_SYRINGE | INTRAVENOUS | Status: DC | PRN
  Administered 2018-09-03: 30 mg via INTRAVENOUS

## 2018-09-03 MED ORDER — FENTANYL CITRATE (PF) 100 MCG/2ML IJ SOLN
INTRAMUSCULAR | Status: DC | PRN
  Administered 2018-09-03 (×2): 25 ug via INTRAVENOUS

## 2018-09-03 MED ORDER — 0.9 % SODIUM CHLORIDE (POUR BTL) OPTIME
TOPICAL | Status: DC | PRN
  Administered 2018-09-03: 1000 mL

## 2018-09-03 MED ORDER — PROPOFOL 10 MG/ML IV BOLUS
INTRAVENOUS | Status: DC | PRN
  Administered 2018-09-03: 100 mg via INTRAVENOUS

## 2018-09-03 MED ORDER — PHENYLEPHRINE HCL 10 MG/ML IJ SOLN
INTRAMUSCULAR | Status: DC | PRN
  Administered 2018-09-03 (×2): 40 ug via INTRAVENOUS
  Administered 2018-09-03: 80 ug via INTRAVENOUS
  Administered 2018-09-03: 40 ug via INTRAVENOUS

## 2018-09-03 MED ORDER — ONDANSETRON HCL 4 MG/2ML IJ SOLN
INTRAMUSCULAR | Status: DC | PRN
  Administered 2018-09-03: 4 mg via INTRAVENOUS

## 2018-09-03 SURGICAL SUPPLY — 20 items
CLOTH BEACON ORANGE TIMEOUT ST (SAFETY) ×3 IMPLANT
COVER LIGHT HANDLE STERIS (MISCELLANEOUS) ×6 IMPLANT
DECANTER SPIKE VIAL GLASS SM (MISCELLANEOUS) ×3 IMPLANT
DRAPE PROXIMA HALF (DRAPES) ×3 IMPLANT
ELECT REM PT RETURN 9FT ADLT (ELECTROSURGICAL) ×3
ELECTRODE REM PT RTRN 9FT ADLT (ELECTROSURGICAL) ×1 IMPLANT
GLOVE BIOGEL PI IND STRL 7.0 (GLOVE) ×1 IMPLANT
GLOVE BIOGEL PI IND STRL 9 (GLOVE) ×1 IMPLANT
GLOVE BIOGEL PI INDICATOR 7.0 (GLOVE) ×4
GLOVE BIOGEL PI INDICATOR 9 (GLOVE) ×2
GLOVE ECLIPSE 9.0 STRL (GLOVE) ×3 IMPLANT
GOWN SPEC L3 XXLG W/TWL (GOWN DISPOSABLE) ×6 IMPLANT
GOWN STRL REUS W/TWL LRG LVL3 (GOWN DISPOSABLE) ×3 IMPLANT
KIT TURNOVER KIT A (KITS) ×3 IMPLANT
MANIFOLD NEPTUNE II (INSTRUMENTS) ×3 IMPLANT
NS IRRIG 1000ML POUR BTL (IV SOLUTION) ×3 IMPLANT
PACK PERI GYN (CUSTOM PROCEDURE TRAY) ×3 IMPLANT
PAD ARMBOARD 7.5X6 YLW CONV (MISCELLANEOUS) ×3 IMPLANT
SET BASIN LINEN APH (SET/KITS/TRAYS/PACK) ×3 IMPLANT
TOWEL OR 17X26 4PK STRL BLUE (TOWEL DISPOSABLE) ×3 IMPLANT

## 2018-09-03 NOTE — Transfer of Care (Signed)
Immediate Anesthesia Transfer of Care Note  Patient: Kimberly Best  Procedure(s) Performed: Jasmine December UNDER ANESTHESIA,REMOVAL OF IMPACTED PESSARY (N/A )  Patient Location: PACU  Anesthesia Type:General  Level of Consciousness: awake and patient cooperative  Airway & Oxygen Therapy: Patient Spontanous Breathing and Patient connected to nasal cannula oxygen  Post-op Assessment: Report given to RN and Post -op Vital signs reviewed and stable  Post vital signs: Reviewed and stable  Last Vitals:  Vitals Value Taken Time  BP    Temp    Pulse 73 09/03/2018  9:55 AM  Resp    SpO2 92 % 09/03/2018  9:55 AM  Vitals shown include unvalidated device data.  Last Pain:  Vitals:   09/03/18 0835  TempSrc: Oral  PainSc:       Patients Stated Pain Goal: 8 (31/43/88 [id number])  Complications: No apparent anesthesia complications

## 2018-09-03 NOTE — H&P (Signed)
Kimberly Best is an 84 y.o. female. She has had uterine prolapse x years, and has been using a pessary for uterine support. She has developed vaginal stenosis below the pessary, and it cannot be removed in the office, despite excellent patient cooperation.  She is brought to the OR for exam under anesthesia and removal of pessary. It may be necessary to bisect the pessary to aid with removal. Pt aware of alternatives of continued use in situ, but I am concerned that  Erosion in to the vaginal apex is possible, up to and including rectal involvement.  Plans are to not replace the pessary until vaginal irritation from the pessary has resolved.   Pertinent Gynecological History: Menses: post-menopausal Bleeding: none except from pessary manipulation Contraception: post menopausal status DES exposure: unknown Blood transfusions: none Sexually transmitted diseases: no past history Previous GYN Procedures: pessary use x years.  Last mammogram: normal Date: 09/03/17 Last pap: normal Date: 04/07/16 OB History: G, P   Menstrual History: Menarche age:  No LMP recorded. Patient is postmenopausal.    Past Medical History:  Diagnosis Date  . Anxiety   . Arthritis   . Cancer [facility])    right breast cancer  . Diabetes mellitus without complication ([place])   . GERD (gastroesophageal reflux disease)   . Pessary maintenance 03/10/2015    Past Surgical History:  Procedure Laterality Date  . BREAST SURGERY Right    lumpectomy   . CHOLECYSTECTOMY      Family History  Problem Relation Age of Onset  . Cancer Father   . Stroke Mother   . Hypertension Mother   . Diabetes Sister   . Hypertension Sister   . Cancer Brother   . Hypertension Brother   . Bipolar disorder Daughter   . Hypertension Maternal Grandmother   . Tuberculosis Paternal Grandmother   . Hypertension Paternal Grandmother   . Alcohol abuse Paternal Grandfather   . Hypertension Paternal Grandfather     Social History:   reports that she has never smoked. She has never used smokeless tobacco. She reports that she does not drink alcohol or use drugs.  Allergies:  Allergies  Allergen Reactions  . Penicillins Swelling    Has patient had a PCN reaction causing immediate rash, facial/tongue/throat swelling, SOB or lightheadedness with hypotension: Yes Has patient had a PCN reaction causing severe rash involving mucus membranes or skin necrosis: No Has patient had a PCN reaction that required hospitalization: No Has patient had a PCN reaction occurring within the last 10 years: No If all of the above answers are "NO", then may proceed with Cephalosporin use.    No medications prior to admission.    ROS  There were no vitals taken for this visit. Physical Exam Physical Examination: General appearance - alert, well appearing, and in no distress, oriented to person, place, and time and overweight Mental status - alert, oriented to person, place, and time, normal mood, behavior, speech, dress, motor activity, and thought processes Eyes - pupils equal and reactive, extraocular eye movements intact Heart - normal rate and regular rhythm Abdomen - soft, nontender, nondistended, no masses or organomegaly Pelvic - VULVA: normal appearing vulva with no masses, tenderness or lesions, VAGINA: atrophic, vaginal discharge - creamy and odorless, CERVIX: pessary in place, unable to see cervix, UTERUS: tiny at last eval, ADNEXA: normal adnexa in size, nontender and no masses, RECTAL: rectovaginal exam confirms pelvic findings Rectal -  Extremities - peripheral pulses normal, no pedal edema, no clubbing or  cyanosis, Homan's sign negative bilaterally  CBC    Component Value Date/Time   WBC 5.0 08/30/2018 0921   RBC 3.54 (L) 08/30/2018 0921   HGB 9.9 (L) 08/30/2018 0921   HCT 30.9 (L) 08/30/2018 0921   PLT 264 08/30/2018 0921   MCV 87.3 08/30/2018 0921   MCH 28.0 08/30/2018 0921   MCHC 32.0 08/30/2018 0921   RDW 15.0  08/30/2018 0921   LYMPHSABS 1.5 08/23/2009 0335   MONOABS 0.7 08/23/2009 0335   EOSABS 0.0 08/23/2009 0335   BASOSABS 0.0 08/23/2009 0335   CMP Latest Ref Rng & Units 08/30/2018 08/23/2009 08/22/2009  Glucose 70 - 99 mg/dL 173(H) 103(H) 114(H)  BUN 8 - 23 mg/dL 12 4(L) 6  Creatinine 0.44 - 1.00 mg/dL 0.73 0.62 0.62  Sodium 135 - 145 mmol/L 140 138 136  Potassium 3.5 - 5.1 mmol/L 3.9 4.0 4.5  Chloride 98 - 111 mmol/L 104 102 101  CO2 22 - 32 mmol/L 25 27 31   Calcium 8.9 - 10.3 mg/dL 9.0 8.7 9.1  Total Protein 6.5 - 8.1 g/dL 6.9 - 7.2  Total Bilirubin 0.3 - 1.2 mg/dL 0.8 - 1.1  Alkaline Phos 38 - 126 U/L 44 - 70  AST 15 - 41 U/L 21 - 15  ALT 0 - 44 U/L 11 - 8    Assessment/Plan: Impacted vaginal pessary,  Diabetes  Plan" Removal of pessary under spinal or general anesthesia. May require amputation of pessary stalk to aid removal, or bivalving of pessary.   Kimberly Best 09/03/2018, 6:49 AM

## 2018-09-03 NOTE — Anesthesia Procedure Notes (Signed)
Procedure Name: LMA Insertion Date/Time: 09/03/2018 9:19 AM Performed by: Georgeanne Nim, CRNA Pre-anesthesia Checklist: Patient identified, Emergency Drugs available, Suction available, Patient being monitored and Timeout performed Patient Re-evaluated:Patient Re-evaluated prior to induction Oxygen Delivery Method: Circle system utilized Preoxygenation: Pre-oxygenation with 100% oxygen Induction Type: IV induction LMA: LMA inserted LMA Size: 4.0 Number of attempts: 1 Placement Confirmation: positive ETCO2,  CO2 detector and breath sounds checked- equal and bilateral Tube secured with: Tape Dental Injury: Teeth and Oropharynx as per pre-operative assessment

## 2018-09-03 NOTE — Op Note (Signed)
09/03/2018  9:54 AM  PATIENT:  Kimberly Best  84 y.o. female  PRE-OPERATIVE DIAGNOSIS:  Impacted Pessary  POST-OPERATIVE DIAGNOSIS:    PROCEDURE:  Procedure(s): EXAM UNDER ANESTHESIA,REMOVAL OF IMPACTED PESSARY (N/A)  SURGEON:  Surgeon(s) and Role:    * [name], MD - Primary  PHYSICIAN ASSISTANT:   ASSISTANTS: none   ANESTHESIA:   general  EBL:  20 mL   BLOOD ADMINISTERED:none  DRAINS: none   LOCAL MEDICATIONS USED:  NONE  SPECIMEN:  Source of Specimen:  pessary returned to family  DISPOSITION OF SPECIMEN:  N/A  COUNTS:  YES   TOURNIQUET:  * No tourniquets in log *  DICTATION: .Dragon Dictation  PLAN OF CARE: Discharge to home after PACU  PATIENT DISPOSITION:  PACU - hemodynamically stable.   Delay start of Pharmacological VTE agent (>24hrs) due to surgical blood loss or risk of bleeding: not applicable Indications 84 year old female with long-standing use of vaginal pessary for uterovaginal prolapse, who is developed some inflammation and fibrosis beneath the pessary and it could not be removed in outpatient setting Details of procedure: Patient was taken the operating room general anesthesia introduced with legs in low lithotomy support.  Perineum prepped, draped, timeout conducted and procedure confirmed.  Lateral sidewall retractors were placed x2 and we were able to visualize the tip of the pessary.  Coker clamp could be used to grasp the stem of the Gellhorn pessary 3 inch pessary.  Using the sidewall retractors at 12:00 and 9:00 we were able to identify the margin of the pessary and use a second Coker clamp to grasp the edge of it and bend the pessary and away that we were able to extract the anterior edge and then the pessary came out the wrist was without further difficulty.  Vaginal tissues were inspected.  There was little granulation tissue at 6:00 and some generalized erythema.  She will be given Premarin vaginal cream prescription for outpatient  use and follow-up in 2 weeks for reconsideration of the smaller pessary or other options

## 2018-09-03 NOTE — Op Note (Signed)
Please see the brief operative note for surgical details 

## 2018-09-03 NOTE — Anesthesia Postprocedure Evaluation (Signed)
Anesthesia Post Note  Patient: Kimberly Best  Procedure(s) Performed: EXAM UNDER ANESTHESIA,REMOVAL OF IMPACTED PESSARY (N/A )  Patient location during evaluation: PACU Anesthesia Type: General Level of consciousness: awake and alert Pain management: pain level controlled Vital Signs Assessment: post-procedure vital signs reviewed and stable Respiratory status: spontaneous breathing Cardiovascular status: stable Postop Assessment: no apparent nausea or vomiting and adequate PO intake Anesthetic complications: no     Last Vitals:  Vitals:   09/03/18 1030 09/03/18 1037  BP: (!) 130/55 123/62  Pulse: 64 74  Resp: (!) 21 18  Temp:  37 C  SpO2: 99% 98%    Last Pain:  Vitals:   09/03/18 1037  TempSrc: Oral  PainSc: 0-No pain                 Everette Rank

## 2018-09-03 NOTE — Discharge Instructions (Signed)
Conjugated Estrogens vaginal cream What is this medicine? CONJUGATED ESTROGENS (CON ju gate ed ESS troe jenz) are a mixture of female hormones. This cream can help relieve symptoms associated with menopause.like vaginal dryness and irritation. This medicine may be used for other purposes; ask your health care provider or pharmacist if you have questions. COMMON BRAND NAME(S): Premarin What should I tell my health care provider before I take this medicine? They need to know if you have any of these conditions: -abnormal vaginal bleeding -blood vessel disease or blood clots -breast, cervical, endometrial, or uterine cancer -dementia -diabetes -gallbladder disease -heart disease or recent heart attack -high blood pressure -high cholesterol -high level of calcium in the blood -hysterectomy -kidney disease -liver disease -migraine headaches -protein C deficiency -protein S deficiency -stroke -systemic lupus erythematosus (SLE) -tobacco smoker -an unusual or allergic reaction to estrogens other medicines, foods, dyes, or preservatives -pregnant or trying to get pregnant -breast-feeding How should I use this medicine? This medicine is for use in the vagina only. Do not take by mouth. Follow the directions on the prescription label. Use at bedtime unless otherwise directed by your doctor or health care professional. Use the special applicator supplied with the cream. Wash hands before and after use. Fill the applicator with the cream and remove from the tube. Lie on your back, part and bend your knees. Insert the applicator into the vagina and push the plunger to expel the cream into the vagina. Wash the applicator with warm soapy water and rinse well. Use exactly as directed for the complete length of time prescribed. Do not stop using except on the advice of your doctor or health care professional. Talk to your pediatrician regarding the use of this medicine in children. Special care may be  needed. A patient package insert for the product will be given with each prescription and refill. Read this sheet carefully each time. The sheet may change frequently. Overdosage: If you think you have taken too much of this medicine contact a poison control center or emergency room at once. NOTE: This medicine is only for you. Do not share this medicine with others. What if I miss a dose? If you miss a dose, use it as soon as you can. If it is almost time for your next dose, use only that dose. Do not use double or extra doses. What may interact with this medicine? Do not take this medicine with any of the following medications: -aromatase inhibitors like aminoglutethimide, anastrozole, exemestane, letrozole, testolactone This medicine may also interact with the following medications: -barbiturates used for inducing sleep or treating seizures -carbamazepine -grapefruit juice -medicines for fungal infections like itraconazole and ketoconazole -raloxifene or tamoxifen -rifabutin -rifampin -rifapentine -ritonavir -some antibiotics used to treat infections -St. John's Wort -warfarin This list may not describe all possible interactions. Give your health care provider a list of all the medicines, herbs, non-prescription drugs, or dietary supplements you use. Also tell them if you smoke, drink alcohol, or use illegal drugs. Some items may interact with your medicine. What should I watch for while using this medicine? Visit your health care professional for regular checks on your progress. You will need a regular breast and pelvic exam. You should also discuss the need for regular mammograms with your health care professional, and follow his or her guidelines. This medicine can make your body retain fluid, making your fingers, hands, or ankles swell. Your blood pressure can go up. Contact your doctor or health care professional if you  feel you are retaining fluid. If you have any reason to think  you are pregnant; stop taking this medicine at once and contact your doctor or health care professional. Tobacco smoking increases the risk of getting a blood clot or having a stroke, especially if you are more than 82 years old. You are strongly advised not to smoke. If you wear contact lenses and notice visual changes, or if the lenses begin to feel uncomfortable, consult your eye care specialist. If you are going to have elective surgery, you may need to stop taking this medicine beforehand. Consult your health care professional for advice prior to scheduling the surgery. What side effects may I notice from receiving this medicine? Side effects that you should report to your doctor or health care professional as soon as possible: -allergic reactions like skin rash, itching or hives, swelling of the face, lips, or tongue -breast tissue changes or discharge -changes in vision -chest pain -confusion, trouble speaking or understanding -dark urine -general ill feeling or flu-like symptoms -light-colored stools -nausea, vomiting -pain, swelling, warmth in the leg -right upper belly pain -severe headaches -shortness of breath -sudden numbness or weakness of the face, arm or leg -trouble walking, dizziness, loss of balance or coordination -unusual vaginal bleeding -yellowing of the eyes or skin Side effects that usually do not require medical attention (report to your doctor or health care professional if they continue or are bothersome): -hair loss -increased hunger or thirst -increased urination -symptoms of vaginal infection like itching, irritation or unusual discharge -unusually weak or tired This list may not describe all possible side effects. Call your doctor for medical advice about side effects. You may report side effects to FDA at 1-800-FDA-1088. Where should I keep my medicine? Keep out of the reach of children. Store at room temperature between 15 and 30 degrees C (59 and 86  degrees F). Throw away any unused medicine after the expiration date. NOTE: This sheet is a summary. It may not cover all possible information. If you have questions about this medicine, talk to your doctor, pharmacist, or health care provider.  2018 Elsevier/Gold Standard (2011-03-01 09:20:36)  PATIENT INSTRUCTIONS POST-ANESTHESIA  IMMEDIATELY FOLLOWING SURGERY:  Do not drive or operate machinery for the first twenty four hours after surgery.  Do not make any important decisions for twenty four hours after surgery or while taking narcotic pain medications or sedatives.  If you develop intractable nausea and vomiting or a severe headache please notify your doctor immediately.  FOLLOW-UP:  Please make an appointment with your surgeon as instructed. You do not need to follow up with anesthesia unless specifically instructed to do so.  WOUND CARE INSTRUCTIONS (if applicable):  Keep a dry clean dressing on the anesthesia/puncture wound site if there is drainage.  Once the wound has quit draining you may leave it open to air.  Generally you should leave the bandage intact for twenty four hours unless there is drainage.  If the epidural site drains for more than 36-48 hours please call the anesthesia department.  QUESTIONS?:  Please feel free to call your physician or the hospital operator if you have any questions, and they will be happy to assist you.

## 2018-09-03 NOTE — Anesthesia Preprocedure Evaluation (Addendum)
Anesthesia Evaluation  Patient identified by MRN, date of birth, ID band Patient awake    Reviewed: Allergy & Precautions, H&P , NPO status , Patient's Chart, lab work & pertinent test results, reviewed documented beta blocker date and time   Airway Mallampati: II  TM Distance: >3 FB Neck ROM: full    Dental no notable dental hx. (+) Upper Dentures, Dental Advidsory Given   Pulmonary neg pulmonary ROS,    Pulmonary exam normal breath sounds clear to auscultation       Cardiovascular Exercise Tolerance: Good negative cardio ROS   Rhythm:regular Rate:Normal     Neuro/Psych Anxiety negative neurological ROS  negative psych ROS   GI/Hepatic negative GI ROS, Neg liver ROS, GERD  ,  Endo/Other  negative endocrine ROSdiabetes, Well Controlled, Type 2  Renal/GU negative Renal ROS  negative genitourinary   Musculoskeletal   Abdominal   Peds  Hematology negative hematology ROS (+)   Anesthesia Other Findings Breast cancer s/p resection DM NSR w/ LAFB reprts recent experiences of vertigo Ambulates with cane for significant c/o left knee arthritis  Reproductive/Obstetrics negative OB ROS                             Anesthesia Physical Anesthesia Plan  ASA: III  Anesthesia Plan: General   Post-op Pain Management:    Induction:   PONV Risk Score and Plan:   Airway Management Planned:   Additional Equipment:   Intra-op Plan:   Post-operative Plan:   Informed Consent: I have reviewed the patients History and Physical, chart, labs and discussed the procedure including the risks, benefits and alternatives for the proposed anesthesia with the patient or authorized representative who has indicated his/her understanding and acceptance.   Dental Advisory Given  Plan Discussed with: CRNA and Anesthesiologist  Anesthesia Plan Comments:        Anesthesia Quick Evaluation

## 2018-09-09 ENCOUNTER — Ambulatory Visit (HOSPITAL_COMMUNITY)
Admission: RE | Admit: 2018-09-09 | Discharge: 2018-09-09 | Disposition: A | Discharge status: Home / Self Care | Payer: Medicare Other | Source: Ambulatory Visit | Attending: Pulmonary Disease | Admitting: Pulmonary Disease

## 2018-09-09 DIAGNOSIS — Z1231 Encounter for screening mammogram for malignant neoplasm of breast: Secondary | ICD-10-CM | POA: Insufficient documentation

## 2018-09-18 ENCOUNTER — Other Ambulatory Visit: Payer: Self-pay

## 2018-09-18 ENCOUNTER — Encounter: Payer: Self-pay | Admitting: Obstetrics and Gynecology

## 2018-09-18 ENCOUNTER — Ambulatory Visit: Payer: Medicare Other | Admitting: Obstetrics and Gynecology

## 2018-09-18 VITALS — BP 135/78 | HR 87 | Ht <= 58 in | Wt 165.0 lb

## 2018-09-18 DIAGNOSIS — N813 Complete uterovaginal prolapse: Secondary | ICD-10-CM

## 2018-09-18 DIAGNOSIS — Z9889 Other specified postprocedural states: Secondary | ICD-10-CM

## 2018-09-18 DIAGNOSIS — Z09 Encounter for follow-up examination after completed treatment for conditions other than malignant neoplasm: Secondary | ICD-10-CM

## 2018-09-18 NOTE — Progress Notes (Signed)
Patient ID: Kimberly Best, female   DOB: 18-Aug-1933, 84 y.o.   MRN: [id number]  Subjective:  Kimberly Best is a 84 y.o. female now 2 weeks status post exam under anesthesia and removal of impacted 3 inch pessary.  She says her womb is still falling down and is still sore in groin region  Review of Systems Negative except tenderness in groin area   Diet:   normal   Bowel movements : normal  Objective:  BP 135/78 (BP Location: Right Arm, Patient Position: Sitting, Cuff Size: Normal)   Pulse 87   Ht 4\' 10"  (1.473 m)   Wt 165 lb (74.8 kg)   BMI 34.49 kg/m  General:Well developed, well nourished.  No acute distress. Abdomen: Bowel sounds normal, soft, non-tender. Pelvic Exam:    External Genitalia: Relaxed introitus with prolapse of cervix and cystocele.    Vagina: Healed vaginal epithelium    Cervix: Complete prolapse of cervix and bladder adequate posterior support to allow pessary placement of a smaller size  Patient was fitted very nicely for 2.5 inch Gellhorn pessary which stayed in and was easily removable.  This pessary will be ordered and the patient will pick it up at [facility] and will return to the office for pessary insertion Assessment:  Post-Op 2 weeks s/p removal of impact pessary    Plan:  1. 2.5 inch Gellhorn pessary 2. Activity restrictions: none 3. return to work: not applicable. 4. Follow up in 1 week for pessary insertion.  By signing my name below, I, Samul Dada, attest that this documentation has been prepared under the direction and in the presence of [name], MD. Electronically Signed: [place]. 09/18/18. 10:16 AM.  I personally performed the services described in this documentation, which was SCRIBED in my presence. The recorded information has been reviewed and considered accurate. It has been edited as necessary during review. [name], MD

## 2018-09-25 ENCOUNTER — Encounter (INDEPENDENT_AMBULATORY_CARE_PROVIDER_SITE_OTHER): Payer: Self-pay

## 2018-09-25 ENCOUNTER — Encounter: Payer: Self-pay | Admitting: Obstetrics and Gynecology

## 2018-09-25 ENCOUNTER — Ambulatory Visit: Payer: Medicare Other | Admitting: Obstetrics and Gynecology

## 2018-09-25 VITALS — BP 123/73 | HR 77 | Ht <= 58 in | Wt 165.4 lb

## 2018-09-25 DIAGNOSIS — Z4689 Encounter for fitting and adjustment of other specified devices: Secondary | ICD-10-CM

## 2018-09-25 NOTE — Progress Notes (Signed)
Patient ID: Kimberly Best, female   DOB: 1933-02-05, 84 y.o.   MRN: [id number]  GYNECOLOGY CLINIC PROGRESS NOTE  The patient presented today for a pessary intertion.  This has been reduced to 2.5 inch pessary Gellhorn with drain she reports no vaginal bleeding or discharge. She denies pelvic discomfort and difficulty urinating or moving her bowels. ?The patient's 2.5 Gellhorn pessary was inserted without complications. The patient should return in 3 months for a pessary check and continue to use vaginal estrogen cream weekly as prescribed.   By signing my name be.[email or web address], I, Samul Dada, attest that this documentation has been prepared under the direction and in the presence of [name], MD. Electronically Signed: [place]. 09/25/18. 10:58 AM.  I personally performed the services described in this documentation, which was SCRIBED in my presence. The recorded information has been reviewed and considered accurate. It has been edited as necessary during review. [name], MD

## 2018-10-01 DIAGNOSIS — Z23 Encounter for immunization: Secondary | ICD-10-CM | POA: Diagnosis not present

## 2018-12-26 ENCOUNTER — Ambulatory Visit: Payer: Medicare Other | Admitting: Obstetrics and Gynecology

## 2019-01-01 ENCOUNTER — Encounter: Payer: Self-pay | Admitting: Obstetrics and Gynecology

## 2019-01-01 ENCOUNTER — Encounter (INDEPENDENT_AMBULATORY_CARE_PROVIDER_SITE_OTHER): Payer: Self-pay

## 2019-01-01 ENCOUNTER — Ambulatory Visit: Payer: Medicare Other | Admitting: Obstetrics and Gynecology

## 2019-01-01 VITALS — BP 123/79 | HR 85 | Ht <= 58 in | Wt 165.0 lb

## 2019-01-01 DIAGNOSIS — Z4689 Encounter for fitting and adjustment of other specified devices: Secondary | ICD-10-CM

## 2019-01-01 NOTE — Progress Notes (Signed)
Patient ID: Kimberly Best, female   DOB: 1933-06-20, 85 y.o.   MRN: [id number]  GYNECOLOGY CLINIC PROGRESS NOTE The patient presented today for a pessary check. She reports no vaginal bleeding or discharge. She denies pelvic discomfort and difficulty urinating or moving her bowels. The patient should return in 6 months for a pessary check and continue to use vaginal estrogen cream weekly as prescribed.   By signing my name below, I, Samul Dada, attest that this documentation has been prepared under the direction and in the presence of [name], MD. Electronically Signed: [place]. 01/01/19. 10:03 AM.  I personally performed the services described in this documentation, which was SCRIBED in my presence. The recorded information has been reviewed and considered accurate. It has been edited as necessary during review. [name], MD

## 2019-01-07 ENCOUNTER — Other Ambulatory Visit (HOSPITAL_COMMUNITY)
Admission: RE | Admit: 2019-01-07 | Discharge: 2019-01-07 | Disposition: A | Discharge status: Home / Self Care | Payer: Medicare Other | Source: Ambulatory Visit | Attending: Pulmonary Disease | Admitting: Pulmonary Disease

## 2019-01-07 DIAGNOSIS — F419 Anxiety disorder, unspecified: Secondary | ICD-10-CM | POA: Insufficient documentation

## 2019-01-07 DIAGNOSIS — C50919 Malignant neoplasm of unspecified site of unspecified female breast: Secondary | ICD-10-CM | POA: Diagnosis not present

## 2019-01-07 DIAGNOSIS — M545 Low back pain: Secondary | ICD-10-CM | POA: Diagnosis not present

## 2019-01-07 DIAGNOSIS — E1165 Type 2 diabetes mellitus with hyperglycemia: Secondary | ICD-10-CM | POA: Insufficient documentation

## 2019-01-07 DIAGNOSIS — E785 Hyperlipidemia, unspecified: Secondary | ICD-10-CM | POA: Insufficient documentation

## 2019-01-07 DIAGNOSIS — Z0000 Encounter for general adult medical examination without abnormal findings: Secondary | ICD-10-CM | POA: Insufficient documentation

## 2019-01-07 DIAGNOSIS — M25552 Pain in left hip: Secondary | ICD-10-CM | POA: Insufficient documentation

## 2019-01-07 DIAGNOSIS — M810 Age-related osteoporosis without current pathological fracture: Secondary | ICD-10-CM | POA: Diagnosis not present

## 2019-01-07 LAB — TSH: TSH: 0.889 u[IU]/mL (ref 0.350–4.500)

## 2019-01-07 LAB — MAGNESIUM: Magnesium: 1.6 mg/dL — ABNORMAL LOW (ref 1.7–2.4)

## 2019-01-07 LAB — COMPREHENSIVE METABOLIC PANEL
ALT: 8 U/L (ref 0–44)
AST: 16 U/L (ref 15–41)
Albumin: 3.8 g/dL (ref 3.5–5.0)
Alkaline Phosphatase: 45 U/L (ref 38–126)
Anion gap: 7 (ref 5–15)
BILIRUBIN TOTAL: 0.5 mg/dL (ref 0.3–1.2)
BUN: 14 mg/dL (ref 8–23)
CO2: 25 mmol/L (ref 22–32)
CREATININE: 0.73 mg/dL (ref 0.44–1.00)
Calcium: 9.4 mg/dL (ref 8.9–10.3)
Chloride: 107 mmol/L (ref 98–111)
GFR calc Af Amer: >60 mL/min (ref >60)
GLUCOSE: 107 mg/dL — AB (ref 70–99)
Potassium: 4.1 mmol/L (ref 3.5–5.1)
Sodium: 139 mmol/L (ref 135–145)
TOTAL PROTEIN: 7.2 g/dL (ref 6.5–8.1)

## 2019-01-07 LAB — HEMOGLOBIN A1C
HEMOGLOBIN A1C: 6.2 % — AB (ref 4.8–5.6)
MEAN PLASMA GLUCOSE: 131.24 mg/dL

## 2019-01-07 LAB — CBC
HCT: 32.7 % — ABNORMAL LOW (ref 36.0–46.0)
Hemoglobin: 10 g/dL — ABNORMAL LOW (ref 12.0–15.0)
MCH: 26.9 pg (ref 26.0–34.0)
MCHC: 30.6 g/dL (ref 30.0–36.0)
MCV: 87.9 fL (ref 80.0–100.0)
NRBC: 0 % (ref 0.0–0.2)
Platelets: 231 10*3/uL (ref 150–400)
RBC: 3.72 MIL/uL — ABNORMAL LOW (ref 3.87–5.11)
RDW: 14.8 % (ref 11.5–15.5)
WBC: 4.7 10*3/uL (ref 4.0–10.5)

## 2019-01-07 LAB — LIPID PANEL
CHOL/HDL RATIO: 3.8 ratio
CHOLESTEROL: 214 mg/dL — AB (ref 0–200)
HDL: 57 mg/dL (ref >40)
LDL Cholesterol: 134 mg/dL — ABNORMAL HIGH (ref 0–99)
Triglycerides: 116 mg/dL (ref <150)
VLDL: 23 mg/dL (ref 0–40)

## 2019-01-16 DIAGNOSIS — L218 Other seborrheic dermatitis: Secondary | ICD-10-CM | POA: Diagnosis not present

## 2019-02-22 IMAGING — MG 2D DIGITAL SCREENING BILATERAL MAMMOGRAM WITH CAD AND ADJUNCT TO
6 of 9 series · 6 of 25 positions shown · non-contrast
Comparison: Previous exam(s).

CLINICAL DATA: Screening.

EXAM:
2D DIGITAL SCREENING BILATERAL MAMMOGRAM WITH CAD AND ADJUNCT TOMO

[R MLO (1 of 2)]
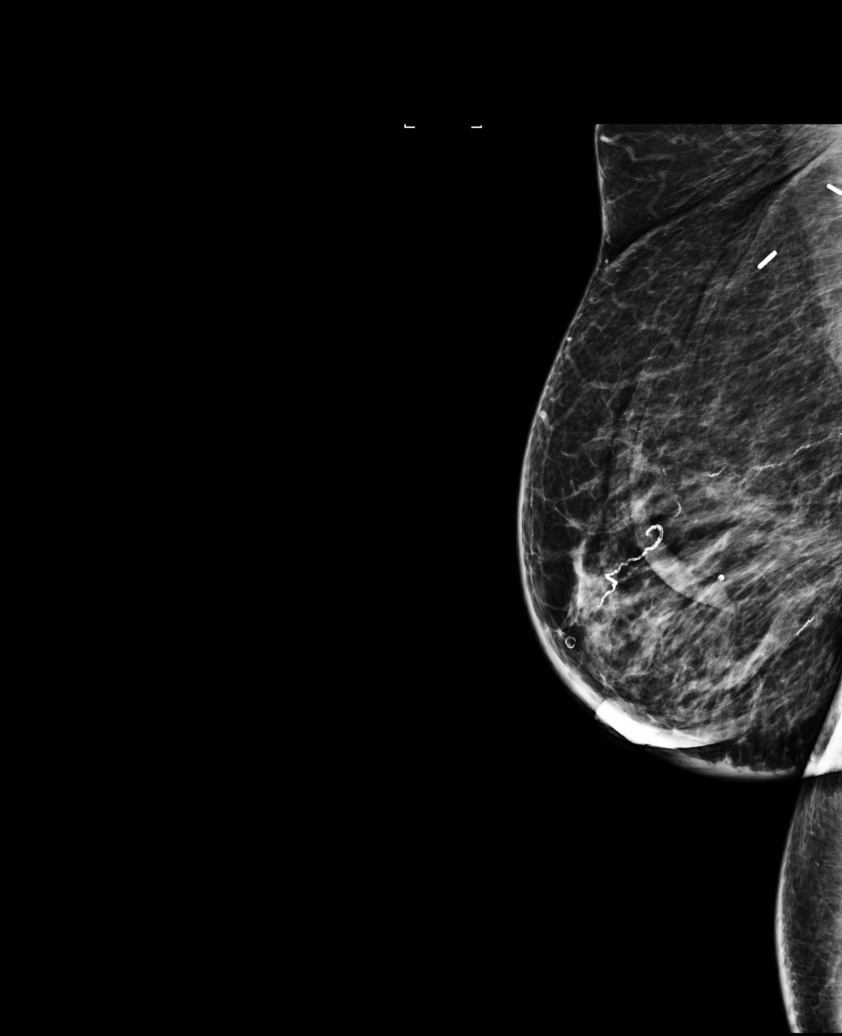

[R MLO (2 of 2)]
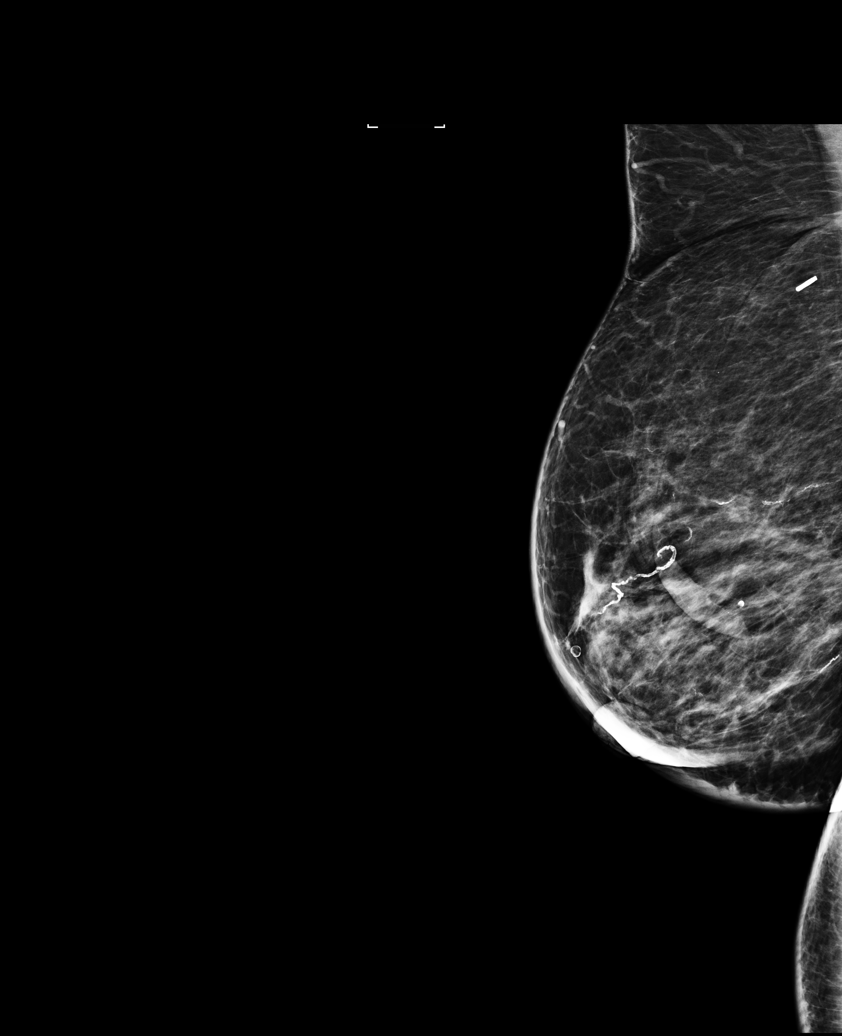

[L MLO]
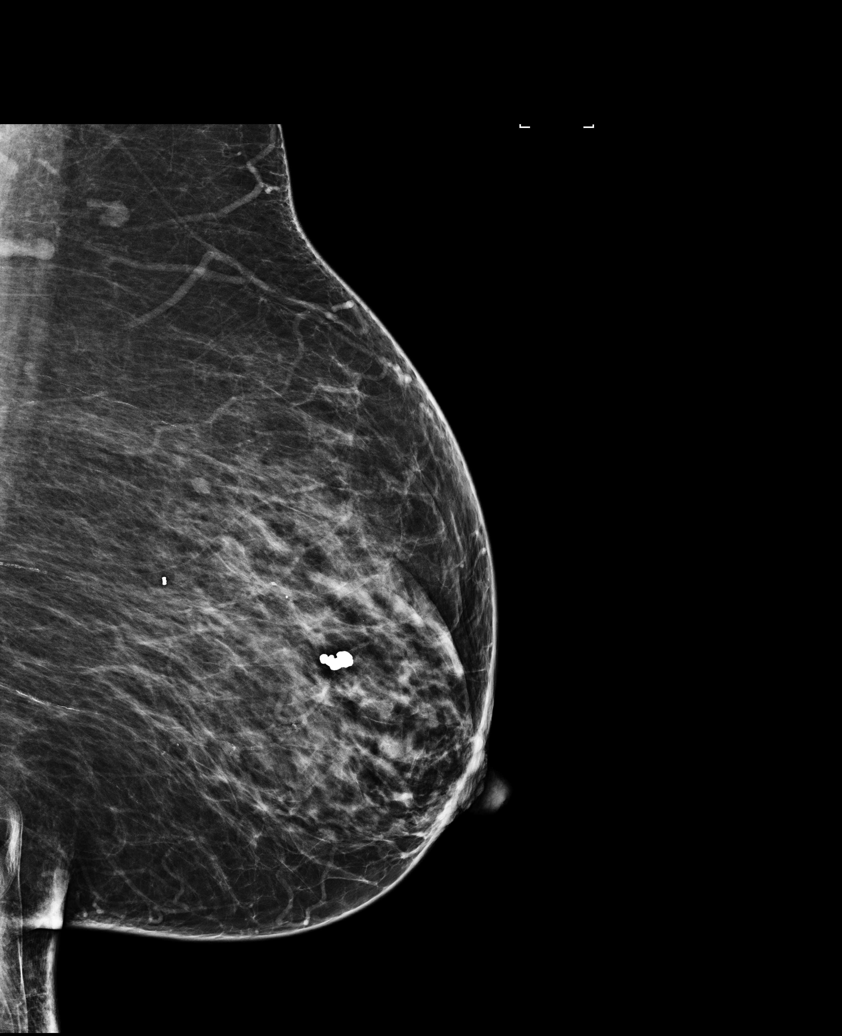

[L CC]
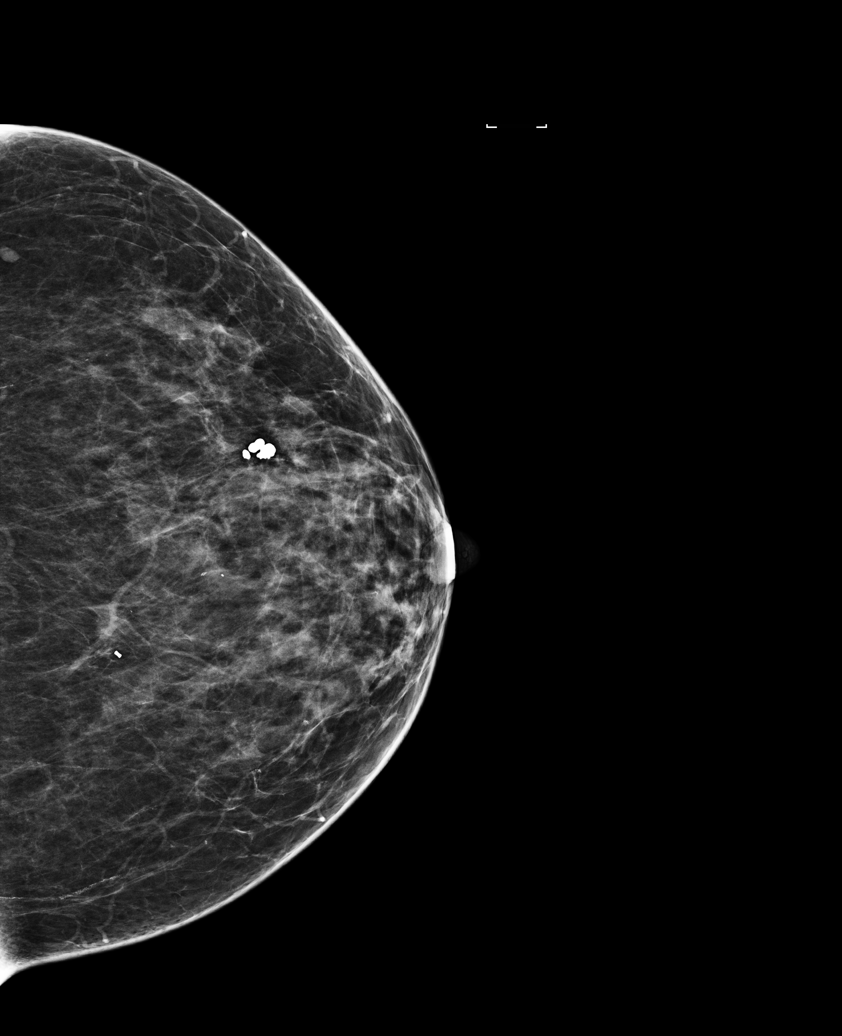

[R CC]
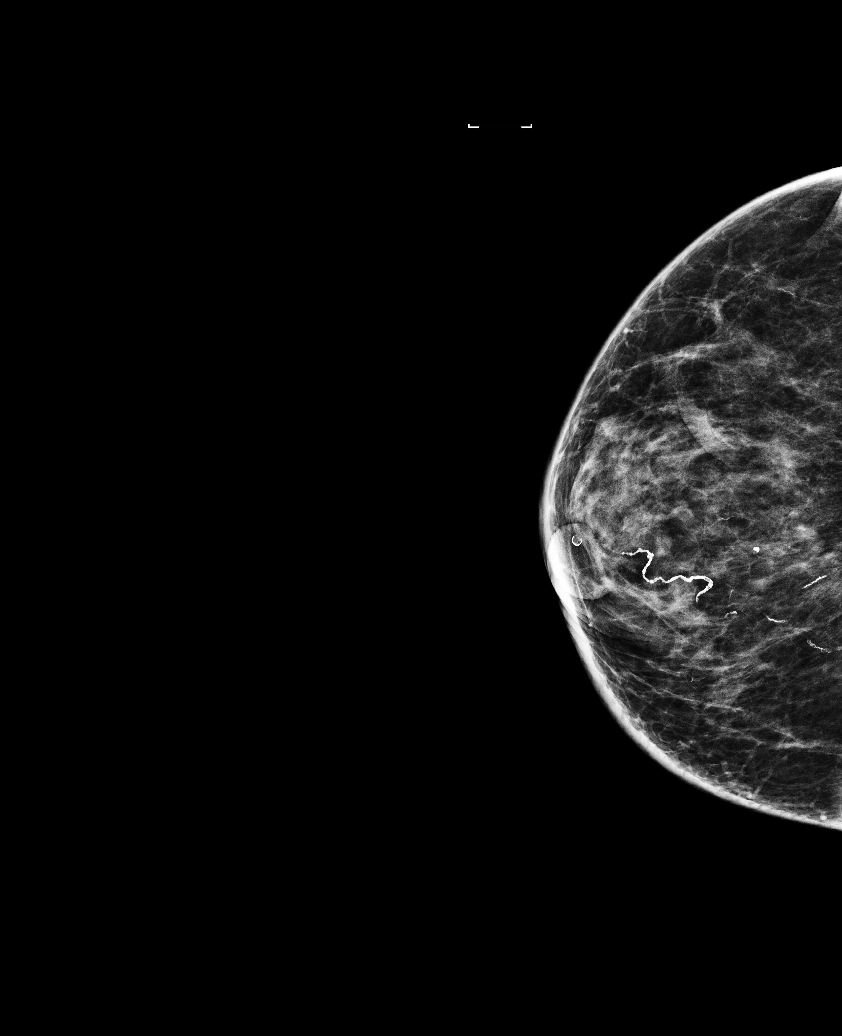

[R MLO tomo · tomo slice 31/61.0]
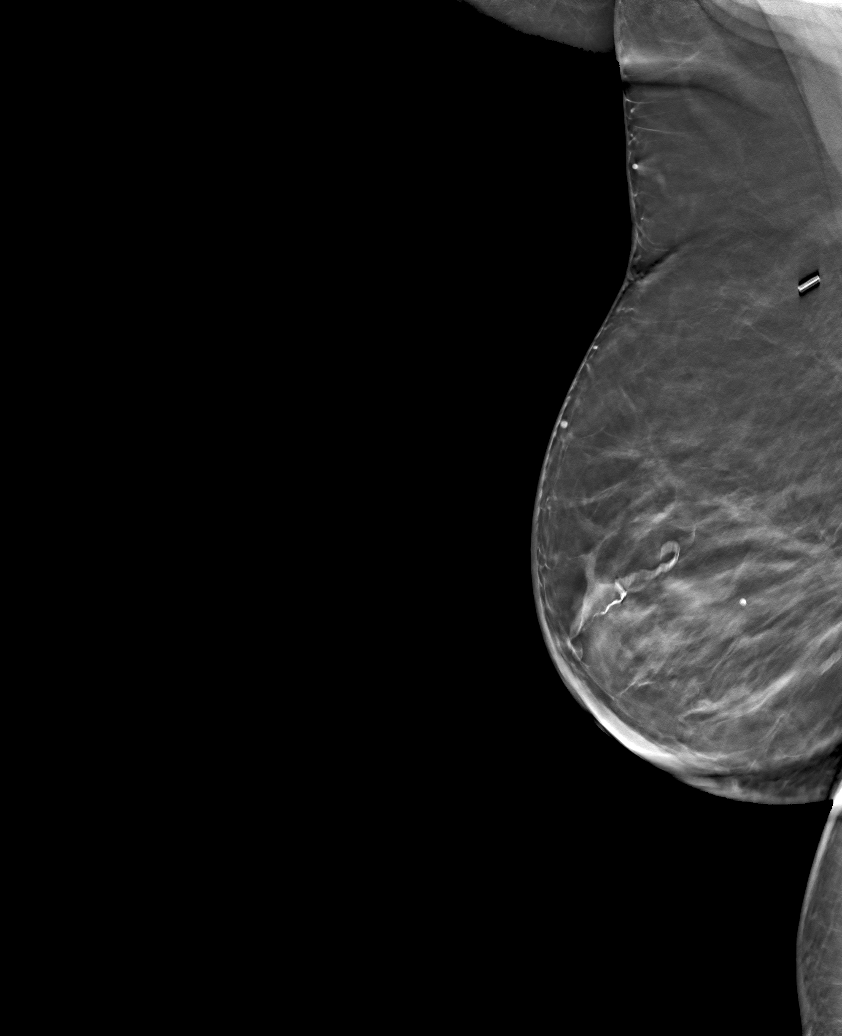

[6 of 25 positions shown; findings below may reference images not displayed]

ACR Breast Density Category c: The breast tissue is heterogeneously
dense, which may obscure small masses.
FINDINGS: There are no findings suspicious for malignancy. Images were
processed with CAD.
IMPRESSION: No mammographic evidence of malignancy. A result letter of this
screening mammogram will be mailed directly to the patient.

RECOMMENDATION:
Screening mammogram in one year. (Code:[id number])

BI-RADS CATEGORY  1: Negative.

## 2019-04-04 DIAGNOSIS — J301 Allergic rhinitis due to pollen: Secondary | ICD-10-CM | POA: Diagnosis not present

## 2019-04-04 DIAGNOSIS — R42 Dizziness and giddiness: Secondary | ICD-10-CM | POA: Diagnosis not present

## 2019-04-07 ENCOUNTER — Other Ambulatory Visit: Payer: Self-pay | Admitting: Pulmonary Disease

## 2019-04-07 DIAGNOSIS — R42 Dizziness and giddiness: Secondary | ICD-10-CM

## 2019-04-08 ENCOUNTER — Other Ambulatory Visit (HOSPITAL_COMMUNITY): Payer: Self-pay | Admitting: Pulmonary Disease

## 2019-04-08 DIAGNOSIS — R42 Dizziness and giddiness: Secondary | ICD-10-CM

## 2019-04-15 DIAGNOSIS — M545 Low back pain: Secondary | ICD-10-CM | POA: Diagnosis not present

## 2019-04-15 DIAGNOSIS — R42 Dizziness and giddiness: Secondary | ICD-10-CM | POA: Diagnosis not present

## 2019-04-15 DIAGNOSIS — J301 Allergic rhinitis due to pollen: Secondary | ICD-10-CM | POA: Diagnosis not present

## 2019-04-15 DIAGNOSIS — E119 Type 2 diabetes mellitus without complications: Secondary | ICD-10-CM | POA: Diagnosis not present

## 2019-05-16 ENCOUNTER — Ambulatory Visit (HOSPITAL_COMMUNITY)
Admission: RE | Admit: 2019-05-16 | Discharge: 2019-05-16 | Disposition: A | Discharge status: Home / Self Care | Payer: Medicare Other | Source: Ambulatory Visit | Attending: Pulmonary Disease | Admitting: Pulmonary Disease

## 2019-05-16 ENCOUNTER — Other Ambulatory Visit: Payer: Self-pay

## 2019-05-16 DIAGNOSIS — S0990XA Unspecified injury of head, initial encounter: Secondary | ICD-10-CM | POA: Diagnosis not present

## 2019-05-16 DIAGNOSIS — R42 Dizziness and giddiness: Secondary | ICD-10-CM | POA: Diagnosis not present

## 2019-05-22 DIAGNOSIS — M545 Low back pain: Secondary | ICD-10-CM | POA: Diagnosis not present

## 2019-05-22 DIAGNOSIS — E119 Type 2 diabetes mellitus without complications: Secondary | ICD-10-CM | POA: Diagnosis not present

## 2019-05-22 DIAGNOSIS — R42 Dizziness and giddiness: Secondary | ICD-10-CM | POA: Diagnosis not present

## 2019-05-27 ENCOUNTER — Other Ambulatory Visit: Payer: Self-pay

## 2019-05-27 NOTE — Patient Outreach (Signed)
[facility]) Care Management  05/27/2019  Kimberly Best Feb 25, 1933 [id number]   Medication Adherence call to Kimberly Best [facility] Identifiers Verify spoke with patient she is past due on Metformin 500 mg patient explain she is taking 1 tablet 2 times a day and never miss a dose patient ask if we can call [name] an order this medication pharmacy will have it ready for patient to pick up. Kimberly Best is showing past due under [name].  [facility] Direct Dial [phone]  Fax [phone] [email or web address]

## 2019-06-09 DIAGNOSIS — H0102A Squamous blepharitis right eye, upper and lower eyelids: Secondary | ICD-10-CM | POA: Diagnosis not present

## 2019-06-09 DIAGNOSIS — H1045 Other chronic allergic conjunctivitis: Secondary | ICD-10-CM | POA: Diagnosis not present

## 2019-06-09 DIAGNOSIS — Z961 Presence of intraocular lens: Secondary | ICD-10-CM | POA: Diagnosis not present

## 2019-06-09 DIAGNOSIS — H0102B Squamous blepharitis left eye, upper and lower eyelids: Secondary | ICD-10-CM | POA: Diagnosis not present

## 2019-06-09 DIAGNOSIS — H5213 Myopia, bilateral: Secondary | ICD-10-CM | POA: Diagnosis not present

## 2019-06-09 DIAGNOSIS — E119 Type 2 diabetes mellitus without complications: Secondary | ICD-10-CM | POA: Diagnosis not present

## 2019-06-09 LAB — HM DIABETES EYE EXAM

## 2019-06-17 DIAGNOSIS — J301 Allergic rhinitis due to pollen: Secondary | ICD-10-CM | POA: Diagnosis not present

## 2019-06-17 DIAGNOSIS — E119 Type 2 diabetes mellitus without complications: Secondary | ICD-10-CM | POA: Diagnosis not present

## 2019-06-17 DIAGNOSIS — M545 Low back pain: Secondary | ICD-10-CM | POA: Diagnosis not present

## 2019-07-01 ENCOUNTER — Telehealth: Payer: Self-pay | Admitting: Obstetrics and Gynecology

## 2019-07-01 NOTE — Telephone Encounter (Signed)
Mailbox not setup

## 2019-07-02 ENCOUNTER — Ambulatory Visit (INDEPENDENT_AMBULATORY_CARE_PROVIDER_SITE_OTHER): Payer: Medicare Other | Admitting: Obstetrics and Gynecology

## 2019-07-02 ENCOUNTER — Other Ambulatory Visit: Payer: Self-pay

## 2019-07-02 ENCOUNTER — Encounter: Payer: Self-pay | Admitting: Obstetrics and Gynecology

## 2019-07-02 VITALS — Ht <= 58 in | Wt 165.4 lb

## 2019-07-02 DIAGNOSIS — Z4689 Encounter for fitting and adjustment of other specified devices: Secondary | ICD-10-CM

## 2019-07-02 DIAGNOSIS — N813 Complete uterovaginal prolapse: Secondary | ICD-10-CM | POA: Diagnosis not present

## 2019-07-02 NOTE — Progress Notes (Signed)
Patient ID: Kimberly Best, female   DOB: 11/16/1933, 85 y.o.   MRN: [id number]  GYNECOLOGY CLINIC PROGRESS NOTE  The patient presented today for a pessary check. She reports no vaginal bleeding or discharge. She denies pelvic discomfort and difficulty urinating or moving her bowels. ?The patient's 2.5 Gellhorn pessary was removed, cleaned and replaced without complications. Speculum examination revealed normal vaginal mucosa with no lesions or lacerations. The patient should return in 6 months for a pessary check and continue to use vaginal estrogen cream weekly as prescribed.  Pelvic:  Vagina: some irritation due to pessary, estrogen 10 mcg capsule placed above pessary before reinsertion.   By signing my name below, I, Samul Dada, attest that this documentation has been prepared under the direction and in the presence of [name], MD. Electronically Signed: [place]. 07/02/19. 9:29 AM.  I personally performed the services described in this documentation, which was SCRIBED in my presence. The recorded information has been reviewed and considered accurate. It has been edited as necessary during review. [name], MD

## 2019-08-04 ENCOUNTER — Other Ambulatory Visit (HOSPITAL_COMMUNITY): Payer: Self-pay | Admitting: Pulmonary Disease

## 2019-08-04 DIAGNOSIS — Z1231 Encounter for screening mammogram for malignant neoplasm of breast: Secondary | ICD-10-CM

## 2019-08-21 DIAGNOSIS — Z23 Encounter for immunization: Secondary | ICD-10-CM | POA: Diagnosis not present

## 2019-08-21 DIAGNOSIS — E119 Type 2 diabetes mellitus without complications: Secondary | ICD-10-CM | POA: Diagnosis not present

## 2019-08-21 DIAGNOSIS — M545 Low back pain: Secondary | ICD-10-CM | POA: Diagnosis not present

## 2019-08-21 DIAGNOSIS — E785 Hyperlipidemia, unspecified: Secondary | ICD-10-CM | POA: Diagnosis not present

## 2019-08-22 DIAGNOSIS — E785 Hyperlipidemia, unspecified: Secondary | ICD-10-CM | POA: Diagnosis not present

## 2019-08-22 DIAGNOSIS — E119 Type 2 diabetes mellitus without complications: Secondary | ICD-10-CM | POA: Diagnosis not present

## 2019-08-22 DIAGNOSIS — M545 Low back pain: Secondary | ICD-10-CM | POA: Diagnosis not present

## 2019-08-22 LAB — BASIC METABOLIC PANEL
BUN: 19 (ref 4–21)
Creatinine: 0.9 (ref <=1.1)
Glucose: 82
Potassium: 5 (ref 3.4–5.3)
Sodium: 142 (ref 137–147)

## 2019-08-22 LAB — LIPID PANEL
Cholesterol: 218 — AB (ref 0–200)
HDL: 50 (ref 35–70)
LDL Cholesterol: 152
Triglycerides: 91 (ref 40–160)

## 2019-08-22 LAB — HEMOGLOBIN A1C: Hemoglobin A1C: 6

## 2019-09-15 ENCOUNTER — Other Ambulatory Visit: Payer: Self-pay

## 2019-09-15 ENCOUNTER — Encounter (HOSPITAL_COMMUNITY): Payer: Self-pay

## 2019-09-15 ENCOUNTER — Ambulatory Visit (HOSPITAL_COMMUNITY)
Admission: RE | Admit: 2019-09-15 | Discharge: 2019-09-15 | Disposition: A | Discharge status: Home / Self Care | Payer: Medicare Other | Source: Ambulatory Visit | Attending: Pulmonary Disease | Admitting: Pulmonary Disease

## 2019-09-15 DIAGNOSIS — Z1231 Encounter for screening mammogram for malignant neoplasm of breast: Secondary | ICD-10-CM | POA: Insufficient documentation

## 2019-09-15 LAB — HM MAMMOGRAPHY: HM Mammogram: NORMAL (ref 0–4)

## 2019-09-16 ENCOUNTER — Encounter: Payer: Self-pay | Admitting: Family Medicine

## 2019-09-16 ENCOUNTER — Ambulatory Visit (INDEPENDENT_AMBULATORY_CARE_PROVIDER_SITE_OTHER): Payer: Medicare Other | Admitting: Family Medicine

## 2019-09-16 VITALS — BP 121/83 | HR 72 | Temp 98.4°F | Ht <= 58 in | Wt 165.8 lb

## 2019-09-16 DIAGNOSIS — M810 Age-related osteoporosis without current pathological fracture: Secondary | ICD-10-CM | POA: Diagnosis not present

## 2019-09-16 DIAGNOSIS — E785 Hyperlipidemia, unspecified: Secondary | ICD-10-CM

## 2019-09-16 DIAGNOSIS — N3941 Urge incontinence: Secondary | ICD-10-CM

## 2019-09-16 DIAGNOSIS — E118 Type 2 diabetes mellitus with unspecified complications: Secondary | ICD-10-CM | POA: Insufficient documentation

## 2019-09-16 DIAGNOSIS — E119 Type 2 diabetes mellitus without complications: Secondary | ICD-10-CM | POA: Insufficient documentation

## 2019-09-16 LAB — POCT UA - MICROALBUMIN
A1c: <30
Creatinine, POC: 100 mg/dL
Microalbumin Ur, POC: 30 mg/L

## 2019-09-16 NOTE — Patient Instructions (Signed)
DEXA  Pt to consider where she will get her Xanax-we can not prescribe that medication at this office. Concern for side effects for this pt.

## 2019-09-16 NOTE — Progress Notes (Signed)
New Patient Office Visit  Subjective:  Patient ID: Kimberly Best, female    DOB: [birth year]  Age: 85 y.o. MRN: [id number]  CC:  Chief Complaint  Patient presents with  . Establish Care  . Headache    nearly everyday    HPI Kimberly Best presents for DM-glucose 77,89,100-take twice  Day, last eye exam-Dr. Jones Broom eyes-A1c 6.1%, feet swelling, no ulcers   Hyperlipidemia-pt states elevated on most recent labwork  CANCER-breast cancer-16 years ago-mammogram 09/15/19 normal  Dr. Karlyne Greenspan placed-incontinence-close follow up-medication to treat irritation-7/20-Uterovaginal prolapse  Chronic Dizziness-meclizine intermittently-6/20- CT brain normal  Osteoporosis-unknown last DEXA-takes Fosamax for greater than 15 years  Anxiety-takes alprazolam a day-  arthritis, multiple joints-tylenol prn  Completed flu shot     Past Medical History:  Diagnosis Date  . Allergy   . Anxiety   . Arthritis   . Cancer [facility])    right breast cancer  . Diabetes mellitus without complication ([place])   . GERD (gastroesophageal reflux disease)   . Hypertension   . Pessary maintenance 03/10/2015    Past Surgical History:  Procedure Laterality Date  . BREAST SURGERY Right    lumpectomy   . CHOLECYSTECTOMY    . EYE SURGERY      Family History  Problem Relation Age of Onset  . Cancer Father   . Stroke Mother   . Hypertension Mother   . Diabetes Sister   . Hypertension Sister   . Cancer Brother   . Hypertension Brother   . Bipolar disorder Daughter   . Hypertension Maternal Grandmother   . Tuberculosis Paternal Grandmother   . Hypertension Paternal Grandmother   . Alcohol abuse Paternal Grandfather   . Hypertension Paternal Grandfather     Social History   Socioeconomic History  . Marital status: Married    Spouse name: Not on file  . Number of children: Not on file  . Years of education: Not on file  . Highest education level: Not on file  Occupational History   . Not on file  Social Needs  . Financial resource strain: Not on file  . Food insecurity    Worry: Not on file    Inability: Not on file  . Transportation needs    Medical: Not on file    Non-medical: Not on file  Tobacco Use  . Smoking status: Never Smoker  . Smokeless tobacco: Never Used  Substance and Sexual Activity  . Alcohol use: No  . Drug use: No  . Sexual activity: Not Currently    Birth control/protection: Post-menopausal  Lifestyle  . Physical activity    Days per week: Not on file    Minutes per session: Not on file  . Stress: Not on file  Relationships  . Social Herbalist on phone: Not on file    Gets together: Not on file    Attends religious service: Not on file    Active member of club or organization: Not on file    Attends meetings of clubs or organizations: Not on file    Relationship status: Not on file  . Intimate partner violence    Fear of current or ex partner: Not on file    Emotionally abused: Not on file    Physically abused: Not on file    Forced sexual activity: Not on file  Other Topics Concern  . Not on file  Social History Narrative  . Not on file    ROS Review  of Systems  Constitutional: Positive for activity change, appetite change, chills, diaphoresis, fatigue and fever.  HENT: Positive for drooling, ear pain, hearing loss, rhinorrhea, sinus pressure, sneezing, trouble swallowing and voice change.   Eyes: Positive for photophobia, pain, discharge, redness and itching.  Respiratory: Positive for cough.   Cardiovascular: Positive for leg swelling.  Gastrointestinal: Positive for diarrhea.  Genitourinary: Positive for decreased urine volume and urgency.  Musculoskeletal: Positive for arthralgias, back pain, joint swelling, myalgias, neck pain and neck stiffness.  Neurological: Positive for dizziness, tremors, speech difficulty, weakness, light-headedness, numbness and headaches.  Psychiatric/Behavioral: Positive for  sleep disturbance.   08/30/2019  1   07/27/2019  Alprazolam 0.5 MG Tablet  60.00  30 Ed Haw   [id number]   Rei ([id number])   1  2.00 LME  Private Pay   [place]  07/28/2019  1   07/27/2019  Alprazolam 0.5 MG Tablet  60.00  30 Ed Haw   [id number]   Rei ([id number])   0  2.00 LME  Medicare   [place]  06/25/2019  1   05/21/2019  Alprazolam 0.5 MG Tablet  60.00  30 Ed Haw   [id number]   Rei ([id number])   1  2.00 LME  Medicare   [place]  05/22/2019  1   05/21/2019  Alprazolam 0.5 MG Tablet  60.00  30 Ed Haw   [id number]   Rei ([id number])   0  2.00 LME  Medicare   [place]  04/18/2019  1   01/17/2019  Alprazolam 0.5 MG Tablet  60.00  30 Ed Haw   [id number]   Rei ([id number])   3  2.00 LME  Medicare   [place]  03/20/2019  1   01/17/2019  Alprazolam 0.5 MG Tablet  60.00  30 Ed Haw   [id number]   Rei ([id number])   2  2.00 LME  Medicare   [place]  02/19/2019  1   01/17/2019  Alprazolam 0.5 MG Tablet  60.00  30 Ed Haw   [id number]   Rei ([id number])   1  2.00 LME  Medicare   [place]  01/17/2019  1   01/17/2019  Alprazolam 0.5 MG Tablet  60.00  30 Ed Haw   [id number]   Rei (5434)   0  2.00 LME  Medicare   [place]  12/09/2018  1   10/04/2018  Alprazolam 0.5 MG Tablet  60.00  30 Ed Haw   [id number]   Rei ([id number])   2  2.00 LME  Medicare   [place]  11/09/2018  1   10/04/2018  Alprazolam 0.5 MG Tablet  60.00  30 Ed Haw   [id number]   Rei (5434)   1  2.00 LME  Medicare   [place]  10/04/2018  1   10/04/2018  Alprazolam 0.5 MG Tablet  60.00  30 Ed Haw   [id number]   Rei (5434)   0  2.00 LME  Medicare   [place]  09/06/2018  1   06/05/2018  Alprazolam 0.5 MG Tablet  60.00  30 Ed Haw   [id number]   Rei ([id number])   3  2.00 LME  Medicare   [place]  08/08/2018  1   06/05/2018  Alprazolam 0.5 MG Tablet  60.00  30 Ed Haw   [id number]   Rei ([id number])   2  2.00 LME  Medicare   [place]  07/12/2018  1   07/12/2018  Oxycodone-Acetaminophen 5-325  120.00  30 Ro Fag   [id number]   Rei ([id number])  0  30.00 MME  Medicare   [place]  07/08/2018  1   06/05/2018  Alprazolam 0.5 MG Tablet  60.00  30 Ed Haw   [id number]   Rei ([id number])   1  2.00 LME  Medicare   [place]  06/05/2018  1   06/05/2018  Alprazolam  0.5 MG Tablet  60.00  30 Ed Haw   [id number]   Rei ([id number])   0  2.00 LME  Medicare   [place]  06/04/2018  1   03/28/2018  Oxycodone-Acetaminophen 5-325  120.00  30 Ed Haw   [id number]   Rei ([id number])   0  30.00 MME  Medicare   [place]  05/03/2018  1   03/28/2018  Oxycodone-Acetaminophen 5-325  120.00  30 Ed Haw   [id number]   Rei ([id number])   0  30.00 MME  Medicare   [place]  05/03/2018  1   04/02/2018  Alprazolam 0.5 MG Tablet  60.00  30 Ed Haw   [id number]   Rei ([id number])   1  2.00 LME  Medicare   [place]  04/03/2018  1   03/28/2018  Oxycodone-Acetaminophen 5-325  120.00  30 Ed Haw   [id number]   Rei ([id number])   0  30.00 MME  Medicare   [place]  04/02/2018  1   04/02/2018  Alprazolam 0.5 MG Tablet  60.00  30 Ed Haw   [id number]   Rei ([id number])   0  2.00 LME  Medicare   [place]  03/01/2018  1   02/28/2018  Oxycodone-Acetaminophen 5-325  120.00  30 Ed Haw   [id number]   Rei ([id number])   0  30.00 MME  Medicare   [place]  02/27/2018  1   12/25/2017  Alprazolam 0.5 MG Tablet  60.00  30 Ed Haw   [id number]   Rei (5434)   2  2.00 LME  Medicare   [place]  01/29/2018  1   12/25/2017  Alprazolam 0.5 MG Tablet  60.00  30 Ed Haw   [id number]   Rei ([id number])   1  2.00 LME  Medicare   [place]  12/25/2017  1   12/25/2017  Alprazolam 0.5 MG Tablet  60.00  30 Ed Haw   [id number]   Rei ([id number])   0  2.00 LME  Medicare   [place]  12/10/2017  1   09/27/2017  Oxycodone-Acetaminophen 5-325  120.00  30 Ed Haw   [id number]   Rei ([id number])   0  30.00 MME  Medicare   [place]  11/22/2017  1   06/01/2017  Alprazolam 0.5 MG Tablet  60.00  30 Ed Haw   [id number]   Rei ([id number])   3  2.00 LME  Medicare   [place]  11/09/2017  1   09/27/2017  Oxycodone-Acetaminophen 5-325  120.00  30 Ed Haw   [id number]   Rei ([id number])   0  30.00 MME  Medicare   [place]  10/05/2017  1   09/27/2017  Oxycodone-Acetaminophen 5-325  120.00  [place]   [id number]   Rei ([id number])   0  30.00 MME      Objective:   Today's Vitals: BP 121/83 (BP Location: Left Arm, Patient Position: Sitting, Cuff Size: Normal)   Pulse 72   Temp 98.4 F (36.9 C) (Oral)   Ht 4\' 10"  (1.473 m)   Wt 165 lb 12.8  oz (75.2 kg)   SpO2 98%   BMI 34.65 kg/m   Physical Exam Constitutional:  Appearance: She is well-developed.  HENT:     Head: Normocephalic and atraumatic.  Neck:     Musculoskeletal: Normal range of motion and neck supple.  Cardiovascular:     Rate and Rhythm: Normal rate and regular rhythm.     Heart sounds: Normal heart sounds.  Pulmonary:     Effort: Pulmonary effort is normal.     Breath sounds: Normal breath sounds.  Neurological:     Mental Status: She is alert.  Psychiatric:        Mood and Affect: Mood normal.        Behavior: Behavior normal.     Assessment & Plan:   1. Osteoporosis, unspecified osteoporosis type, unspecified pathological fracture presence Vit D-no longer taking - DG Bone Density; Future foxamax 2. Urge incontinence GYN following  3. Controlled type 2 diabetes mellitus without complication, without long-term current use of insulin (HCC) Metformin-BID Foot exam Eye exam lisinopril 4. Hyperlipidemia, unspecified hyperlipidemia type No medications currently   Outpatient Encounter Medications as of 09/16/2019  Medication Sig  . alendronate (FOSAMAX) 70 MG tablet Take 70 mg by mouth once a week. Take on Wednesday with a full glass of water on an empty stomach.  . ALPRAZolam (XANAX) 0.5 MG tablet Take 0.5 mg by mouth 2 (two) times daily as needed for anxiety.   Marland Kitchen aspirin 325 MG tablet Take 325 mg by mouth daily as needed for mild pain.   . cholecalciferol (VITAMIN D) 1000 UNITS tablet Take 1,000 Units by mouth daily.  . fluticasone (FLONASE) 50 MCG/ACT nasal spray Place 2 sprays into both nostrils 3 (three) times daily as needed for allergies or rhinitis.  Marland Kitchen lisinopril (PRINIVIL,ZESTRIL) 2.5 MG tablet Take 2.5 mg by mouth daily.  . meclizine (ANTIVERT) 25 MG tablet Take 25 mg by mouth 2 (two) times daily as needed for dizziness.   . metFORMIN (GLUCOPHAGE) 500 MG tablet Take 500 mg by mouth 2 (two) times daily with a meal.   No  facility-administered encounter medications on file as of 09/16/2019.     Follow-up: 3 months Concern for xanax for anxiety-d/w pt can not write for the this medication-pt states she has gotten from another physician LISA Hannah Beat, MD

## 2019-12-17 ENCOUNTER — Ambulatory Visit (INDEPENDENT_AMBULATORY_CARE_PROVIDER_SITE_OTHER): Payer: Medicare Other | Admitting: Family Medicine

## 2019-12-17 ENCOUNTER — Encounter: Payer: Self-pay | Admitting: Family Medicine

## 2019-12-17 VITALS — BP 138/60 | HR 78 | Temp 98.6°F | Ht <= 58 in | Wt 168.8 lb

## 2019-12-17 DIAGNOSIS — E785 Hyperlipidemia, unspecified: Secondary | ICD-10-CM | POA: Diagnosis not present

## 2019-12-17 DIAGNOSIS — E119 Type 2 diabetes mellitus without complications: Secondary | ICD-10-CM | POA: Diagnosis not present

## 2019-12-17 DIAGNOSIS — H9203 Otalgia, bilateral: Secondary | ICD-10-CM | POA: Diagnosis not present

## 2019-12-17 DIAGNOSIS — M810 Age-related osteoporosis without current pathological fracture: Secondary | ICD-10-CM

## 2019-12-17 NOTE — Patient Instructions (Addendum)
Vit D-continue to take daily Eat Foods that contain calcium Mucinex DM-tablets for congestion and cough Voltaren gel for joint and muscle ache  Osteoporosis  Osteoporosis happens when your bones get thin and weak. This can cause your bones to break (fracture) more easily. You can do things at home to make your bones stronger. Follow these instructions at home:  Activity  Exercise as told by your doctor. Ask your doctor what activities are safe for you. You should do: ? Exercises that make your muscles work to hold your body weight up (weight-bearing exercises). These include tai chi, yoga, and walking. ? Exercises to make your muscles stronger. One example is lifting weights. Lifestyle  Limit alcohol intake to no more than 1 drink a day for nonpregnant women and 2 drinks a day for men. One drink equals 12 oz of beer, 5 oz of wine, or 1 oz of hard liquor.  Do not use any products that have nicotine or tobacco in them. These include cigarettes and e-cigarettes. If you need help quitting, ask your doctor. Preventing falls  Use tools to help you move around (mobility aids) as needed. These include canes, walkers, scooters, and crutches.  Keep rooms well-lit and free of clutter.  Put away things that could make you trip. These include cords and rugs.  Install safety rails on stairs. Install grab bars in bathrooms.  Use rubber mats in slippery areas, like bathrooms.  Wear shoes that: ? Fit you well. ? Support your feet. ? Have closed toes. ? Have rubber soles or low heels.  Tell your doctor about all of the medicines you are taking. Some medicines can make you more likely to fall. General instructions  Eat plenty of calcium and vitamin D. These nutrients are good for your bones. Good sources of calcium and vitamin D include: ? Some fatty fish, such as salmon and tuna. ? Foods that have calcium and vitamin D added to them (fortified foods). For example, some breakfast cereals are  fortified with calcium and vitamin D. ? Egg yolks. ? Cheese. ? Liver.  Take over-the-counter and prescription medicines only as told by your doctor.  Keep all follow-up visits as told by your doctor. This is important. Contact a doctor if:  You have not been tested (screened) for osteoporosis and you are: ? A woman who is age 66 or older. ? A man who is age 19 or older. Get help right away if:  You fall.  You get hurt. Summary  Osteoporosis happens when your bones get thin and weak.  Weak bones can break (fracture) more easily.  Eat plenty of calcium and vitamin D. These nutrients are good for your bones.  Tell your doctor about all of the medicines that you take. This information is not intended to replace advice given to you by your health care provider. Make sure you discuss any questions you have with your health care provider. Document Revised: 11/09/2017 Document Reviewed: 09/21/2017 Elsevier Patient Education  2020 [place]. Osteoarthritis  Osteoarthritis is a type of arthritis that affects tissue that covers the ends of bones in joints (cartilage). Cartilage acts as a cushion between the bones and helps them move smoothly. Osteoarthritis results when cartilage in the joints gets worn down. Osteoarthritis is sometimes called "wear and tear" arthritis. Osteoarthritis is the most common form of arthritis. It often occurs in older people. It is a condition that gets worse over time (a progressive condition). Joints that are most often affected by this  condition are in:  Fingers.  Toes.  Hips.  Knees.  Spine, including neck and lower back. What are the causes? This condition is caused by age-related wearing down of cartilage that covers the ends of bones. What increases the risk? The following factors may make you more likely to develop this condition:  Older age.  Being overweight or obese.  Overuse of joints, such as in athletes.  Past injury of a  joint.  Past surgery on a joint.  Family history of osteoarthritis. What are the signs or symptoms? The main symptoms of this condition are pain, swelling, and stiffness in the joint. The joint may lose its shape over time. Small pieces of bone or cartilage may break off and float inside of the joint, which may cause more pain and damage to the joint. Small deposits of bone (osteophytes) may grow on the edges of the joint. Other symptoms may include:  A grating or scraping feeling inside the joint when you move it.  Popping or creaking sounds when you move. Symptoms may affect one or more joints. Osteoarthritis in a major joint, such as your knee or hip, can make it painful to walk or exercise. If you have osteoarthritis in your hands, you might not be able to grip items, twist your hand, or control small movements of your hands and fingers (fine motor skills). How is this diagnosed? This condition may be diagnosed based on:  Your medical history.  A physical exam.  Your symptoms.  X-rays of the affected joint(s).  Blood tests to rule out other types of arthritis. How is this treated? There is no cure for this condition, but treatment can help to control pain and improve joint function. Treatment plans may include:  A prescribed exercise program that allows for rest and joint relief. You may work with a physical therapist.  A weight control plan.  Pain relief techniques, such as: ? Applying heat and cold to the joint. ? Electric pulses delivered to nerve endings under the skin (transcutaneous electrical nerve stimulation, or TENS). ? Massage. ? Certain nutritional supplements.  NSAIDs or prescription medicines to help relieve pain.  Medicine to help relieve pain and inflammation (corticosteroids). This can be given by mouth (orally) or as an injection.  Assistive devices, such as a brace, wrap, splint, specialized glove, or cane.  Surgery, such as: ? An osteotomy. This is  done to reposition the bones and relieve pain or to remove loose pieces of bone and cartilage. ? Joint replacement surgery. You may need this surgery if you have very bad (advanced) osteoarthritis. Follow these instructions at home: Activity  Rest your affected joints as directed by your health care provider.  Do not drive or use heavy machinery while taking prescription pain medicine.  Exercise as directed. Your health care provider or physical therapist may recommend specific types of exercise, such as: ? Strengthening exercises. These are done to strengthen the muscles that support joints that are affected by arthritis. They can be performed with weights or with exercise bands to add resistance. ? Aerobic activities. These are exercises, such as brisk walking or water aerobics, that get your heart pumping. ? Range-of-motion activities. These keep your joints easy to move. ? Balance and agility exercises. Managing pain, stiffness, and swelling      If directed, apply heat to the affected area as often as told by your health care provider. Use the heat source that your health care provider recommends, such as a moist heat  pack or a heating pad. ? If you have a removable assistive device, remove it as told by your health care provider. ? Place a towel between your skin and the heat source. If your health care provider tells you to keep the assistive device on while you apply heat, place a towel between the assistive device and the heat source. ? Leave the heat on for 20-30 minutes. ? Remove the heat if your skin turns bright red. This is especially important if you are unable to feel pain, heat, or cold. You may have a greater risk of getting burned.  If directed, put ice on the affected joint: ? If you have a removable assistive device, remove it as told by your health care provider. ? Put ice in a plastic bag. ? Place a towel between your skin and the bag. If your health care provider  tells you to keep the assistive device on during icing, place a towel between the assistive device and the bag. ? Leave the ice on for 20 minutes, 2-3 times a day. General instructions  Take over-the-counter and prescription medicines only as told by your health care provider.  Maintain a healthy weight. Follow instructions from your health care provider for weight control. These may include dietary restrictions.  Do not use any products that contain nicotine or tobacco, such as cigarettes and e-cigarettes. These can delay bone healing. If you need help quitting, ask your health care provider.  Use assistive devices as directed by your health care provider.  Keep all follow-up visits as told by your health care provider. This is important. Where to find more information  [employer] of Arthritis and Musculoskeletal and Skin Diseases: www.niams.SouthExposed.es  [employer] on Aging: http://kim-miller.com/  American College of Rheumatology: www.rheumatology.org Contact a health care provider if:  Your skin turns red.  You develop a rash.  You have pain that gets worse.  You have a fever along with joint or muscle aches. Get help right away if:  You lose a lot of weight.  You suddenly lose your appetite.  You have night sweats. Summary  Osteoarthritis is a type of arthritis that affects tissue covering the ends of bones in joints (cartilage).  This condition is caused by age-related wearing down of cartilage that covers the ends of bones.  The main symptom of this condition is pain, swelling, and stiffness in the joint.  There is no cure for this condition, but treatment can help to control pain and improve joint function. This information is not intended to replace advice given to you by your health care provider. Make sure you discuss any questions you have with your health care provider. Document Revised: 11/09/2017 Document Reviewed: 07/31/2016 Elsevier Patient  Education  2020 [place]. Diabetes Basics  Diabetes (diabetes mellitus) is a long-term (chronic) disease. It occurs when the body does not properly use sugar (glucose) that is released from food after you eat. Diabetes may be caused by one or both of these problems:  Your pancreas does not make enough of a hormone called insulin.  Your body does not react in a normal way to insulin that it makes. Insulin lets sugars (glucose) go into cells in your body. This gives you energy. If you have diabetes, sugars cannot get into cells. This causes high blood sugar (hyperglycemia). Follow these instructions at home: How is diabetes treated? You may need to take insulin or other diabetes medicines daily to keep your blood sugar in balance. Take your  diabetes medicines every day as told by your doctor. List your diabetes medicines here: Diabetes medicines  Name of medicine: ______________________________ ? Amount (dose): _______________ Time (a.m./p.m.): _______________ Notes: ___________________________________  Name of medicine: ______________________________ ? Amount (dose): _______________ Time (a.m./p.m.): _______________ Notes: ___________________________________  Name of medicine: ______________________________ ? Amount (dose): _______________ Time (a.m./p.m.): _______________ Notes: ___________________________________ If you use insulin, you will learn how to give yourself insulin by injection. You may need to adjust the amount based on the food that you eat. List the types of insulin you use here: Insulin  Insulin type: ______________________________ ? Amount (dose): _______________ Time (a.m./p.m.): _______________ Notes: ___________________________________  Insulin type: ______________________________ ? Amount (dose): _______________ Time (a.m./p.m.): _______________ Notes: ___________________________________  Insulin type: ______________________________ ? Amount (dose):  _______________ Time (a.m./p.m.): _______________ Notes: ___________________________________  Insulin type: ______________________________ ? Amount (dose): _______________ Time (a.m./p.m.): _______________ Notes: ___________________________________  Insulin type: ______________________________ ? Amount (dose): _______________ Time (a.m./p.m.): _______________ Notes: ___________________________________ How do I manage my blood sugar?  Check your blood sugar levels using a blood glucose monitor as directed by your doctor. Your doctor will set treatment goals for you. Generally, you should have these blood sugar levels:  Before meals (preprandial): 80-130 mg/dL (4.4-7.2 mmol/L).  After meals (postprandial): below 180 mg/dL (10 mmol/L).  A1c level: less than 7%. Write down the times that you will check your blood sugar levels: Blood sugar checks  Time: _______________ Notes: ___________________________________  Time: _______________ Notes: ___________________________________  Time: _______________ Notes: ___________________________________  Time: _______________ Notes: ___________________________________  Time: _______________ Notes: ___________________________________  Time: _______________ Notes: ___________________________________  What do I need to know about low blood sugar? Low blood sugar is called hypoglycemia. This is when blood sugar is at or below 70 mg/dL (3.9 mmol/L). Symptoms may include:  Feeling: ? Hungry. ? Worried or nervous (anxious). ? Sweaty and clammy. ? Confused. ? Dizzy. ? Sleepy. ? Sick to your stomach (nauseous).  Having: ? A fast heartbeat. ? A headache. ? A change in your vision. ? Tingling or no feeling (numbness) around the mouth, lips, or tongue. ? Jerky movements that you cannot control (seizure).  Having trouble with: ? Moving (coordination). ? Sleeping. ? Passing out (fainting). ? Getting upset easily (irritability). Treating low  blood sugar To treat low blood sugar, eat or drink something sugary right away. If you can think clearly and swallow safely, follow the 15:15 rule:  Take 15 grams of a fast-acting carb (carbohydrate). Talk with your doctor about how much you should take.  Some fast-acting carbs are: ? Sugar tablets (glucose pills). Take 3-4 glucose pills. ? 6-8 pieces of hard candy. ? 4-6 oz (120-150 mL) of fruit juice. ? 4-6 oz (120-150 mL) of regular (not diet) soda. ? 1 Tbsp (15 mL) honey or sugar.  Check your blood sugar 15 minutes after you take the carb.  If your blood sugar is still at or below 70 mg/dL (3.9 mmol/L), take 15 grams of a carb again.  If your blood sugar does not go above 70 mg/dL (3.9 mmol/L) after 3 tries, get help right away.  After your blood sugar goes back to normal, eat a meal or a snack within 1 hour. Treating very low blood sugar If your blood sugar is at or below 54 mg/dL (3 mmol/L), you have very low blood sugar (severe hypoglycemia). This is an emergency. Do not wait to see if the symptoms will go away. Get medical help right away. Call your local emergency services (911 in the [place] Do not  drive yourself to the hospital. Questions to ask your health care provider  Do I need to meet with a diabetes educator?  What equipment will I need to care for myself at home?  What diabetes medicines do I need? When should I take them?  How often do I need to check my blood sugar?  What number can I call if I have questions?  When is my next doctor's visit?  Where can I find a support group for people with diabetes? Where to find more information  American Diabetes Association: www.diabetes.org  American Association of Diabetes Educators: www.diabeteseducator.org/patient-resources Contact a doctor if:  Your blood sugar is at or above 240 mg/dL (13.3 mmol/L) for 2 days in a row.  You have been sick or have had a fever for 2 days or more, and you are not getting  better.  You have any of these problems for more than 6 hours: ? You cannot eat or drink. ? You feel sick to your stomach (nauseous). ? You throw up (vomit). ? You have watery poop (diarrhea). Get help right away if:  Your blood sugar is lower than 54 mg/dL (3 mmol/L).  You get confused.  You have trouble: ? Thinking clearly. ? Breathing. Summary  Diabetes (diabetes mellitus) is a long-term (chronic) disease. It occurs when the body does not properly use sugar (glucose) that is released from food after digestion.  Take insulin and diabetes medicines as told.  Check your blood sugar every day, as often as told.  Keep all follow-up visits as told by your doctor. This is important. This information is not intended to replace advice given to you by your health care provider. Make sure you discuss any questions you have with your health care provider. Document Revised: 08/20/2019 Document Reviewed: 03/01/2018 Elsevier Patient Education  [place].

## 2019-12-17 NOTE — Progress Notes (Signed)
Established Patient Office Visit  Subjective:  Patient ID: Kimberly Best, female    DOB: 01/23/1933  Age: 86 y.o. MRN: [id number]  CC:  Chief Complaint  Patient presents with  . Follow-up  right ear with decrease in hearing  HPI Kimberly Best presents for decrease in hearing in the right ear-pain into the neck DM-glucose 97 this morning-no foot ulcers [facility] came to examine patient at her home Past Medical History:  Diagnosis Date  . Allergy   . Anxiety   . Arthritis   . Cancer [facility])    right breast cancer  . Diabetes mellitus without complication ([place])   . GERD (gastroesophageal reflux disease)   . Hypertension   . Pessary maintenance 03/10/2015    Past Surgical History:  Procedure Laterality Date  . BREAST SURGERY Right    lumpectomy   . CHOLECYSTECTOMY    . EYE SURGERY      Family History  Problem Relation Age of Onset  . Cancer Father   . Stroke Mother   . Hypertension Mother   . Diabetes Sister   . Hypertension Sister   . Cancer Brother   . Hypertension Brother   . Bipolar disorder Daughter   . Hypertension Maternal Grandmother   . Tuberculosis Paternal Grandmother   . Hypertension Paternal Grandmother   . Alcohol abuse Paternal Grandfather   . Hypertension Paternal Grandfather     Social History   Socioeconomic History  . Marital status: Married    Spouse name: Not on file  . Number of children: Not on file  . Years of education: Not on file  . Highest education level: Not on file  Occupational History  . Not on file  Tobacco Use  . Smoking status: Never Smoker  . Smokeless tobacco: Never Used  Substance and Sexual Activity  . Alcohol use: No  . Drug use: No  . Sexual activity: Not Currently    Birth control/protection: Post-menopausal  Other Topics Concern  . Not on file  Social History Narrative  . Not on file   Social Determinants of Health   Financial Resource Strain:   . Difficulty of Paying Living Expenses: Not  on file  Food Insecurity:   . Worried About Charity fundraiser in the Last Year: Not on file  . Ran Out of Food in the Last Year: Not on file  Transportation Needs:   . Lack of Transportation (Medical): Not on file  . Lack of Transportation (Non-Medical): Not on file  Physical Activity:   . Days of Exercise per Week: Not on file  . Minutes of Exercise per Session: Not on file  Stress:   . Feeling of Stress : Not on file  Social Connections:   . Frequency of Communication with Friends and Family: Not on file  . Frequency of Social Gatherings with Friends and Family: Not on file  . Attends Religious Services: Not on file  . Active Member of Clubs or Organizations: Not on file  . Attends Archivist Meetings: Not on file  . Marital Status: Not on file  Intimate Partner Violence:   . Fear of Current or Ex-Partner: Not on file  . Emotionally Abused: Not on file  . Physically Abused: Not on file  . Sexually Abused: Not on file    Outpatient Medications Prior to Visit  Medication Sig Dispense Refill  . alendronate (FOSAMAX) 70 MG tablet Take 70 mg by mouth once a week. Take  on Wednesday with a full glass of water on an empty stomach.    . ALPRAZolam (XANAX) 0.5 MG tablet Take 0.5 mg by mouth 2 (two) times daily as needed for anxiety.     Marland Kitchen aspirin 325 MG tablet Take 325 mg by mouth daily as needed for mild pain.     . cholecalciferol (VITAMIN D) 1000 UNITS tablet Take 1,000 Units by mouth daily.    . fluticasone (FLONASE) 50 MCG/ACT nasal spray Place 2 sprays into both nostrils 3 (three) times daily as needed for allergies or rhinitis.    Marland Kitchen lisinopril (PRINIVIL,ZESTRIL) 2.5 MG tablet Take 2.5 mg by mouth daily.    . meclizine (ANTIVERT) 25 MG tablet Take 25 mg by mouth 2 (two) times daily as needed for dizziness.     . metFORMIN (GLUCOPHAGE) 500 MG tablet Take 500 mg by mouth 2 (two) times daily with a meal.     No facility-administered medications prior to visit.     Allergies  Allergen Reactions  . Ace Inhibitors   . Penicillins Swelling    Has patient had a PCN reaction causing immediate rash, facial/tongue/throat swelling, SOB or lightheadedness with hypotension: Yes Has patient had a PCN reaction causing severe rash involving mucus membranes or skin necrosis: No Has patient had a PCN reaction that required hospitalization: No Has patient had a PCN reaction occurring within the last 10 years: No If all of the above answers are "NO", then may proceed with Cephalosporin use.    ROS Review of Systems  Constitutional: Negative.   HENT: Positive for ear pain.   Eyes:       Eye glasses  Respiratory: Negative.   Cardiovascular: Negative.   Gastrointestinal:       GERD  Endocrine:       DM  Genitourinary:       GYN following prolapse-pessary  Musculoskeletal: Positive for arthralgias.  Skin: Negative.   Allergic/Immunologic: Negative.   Neurological: Negative.   Hematological: Negative.   Psychiatric/Behavioral: Negative.       Objective:    Physical Exam  Constitutional: She is oriented to person, place, and time. She appears well-developed and well-nourished.  HENT:  Head: Normocephalic and atraumatic.  Right Ear: External ear normal.  Left Ear: External ear normal.  Nose: Nose normal.  Mouth/Throat: Oropharynx is clear and moist.  Eyes: Conjunctivae are normal.  Cardiovascular: Normal rate, regular rhythm and normal heart sounds.  Pulmonary/Chest: Effort normal and breath sounds normal.  Musculoskeletal:     Cervical back: Neck supple.  Neurological: She is oriented to person, place, and time.  Psychiatric: She has a normal mood and affect.    BP 138/60 (BP Location: Left Arm, Patient Position: Sitting, Cuff Size: Normal)   Pulse 78   Temp 98.6 F (37 C) (Oral)   Ht 4\' 10"  (1.473 m)   Wt 168 lb 12.8 oz (76.6 kg)   SpO2 98%   BMI 35.28 kg/m  Wt Readings from Last 3 Encounters:  12/17/19 168 lb 12.8 oz (76.6 kg)   09/16/19 165 lb 12.8 oz (75.2 kg)  07/02/19 165 lb 6.4 oz (75 kg)     Health Maintenance Due  Topic Date Due  . TETANUS/TDAP  05/08/1954  . DEXA SCAN  05/08/2000      Lab Results  Component Value Date   TSH 0.889 01/07/2019   Lab Results  Component Value Date   WBC 4.7 01/07/2019   HGB 10.0 (L) 01/07/2019   HCT 32.7 (L)  01/07/2019   MCV 87.9 01/07/2019   PLT 231 01/07/2019   Lab Results  Component Value Date   NA 142 08/22/2019   K 5.0 08/22/2019   CO2 25 01/07/2019   GLUCOSE 107 (H) 01/07/2019   BUN 19 08/22/2019   CREATININE 0.9 08/22/2019   BILITOT 0.5 01/07/2019   ALKPHOS 45 01/07/2019   AST 16 01/07/2019   ALT 8 01/07/2019   PROT 7.2 01/07/2019   ALBUMIN 3.8 01/07/2019   CALCIUM 9.4 01/07/2019   ANIONGAP 7 01/07/2019   Lab Results  Component Value Date   CHOL 218 (A) 08/22/2019   Lab Results  Component Value Date   HDL 50 08/22/2019   Lab Results  Component Value Date   LDLCALC 152 08/22/2019   Lab Results  Component Value Date   TRIG 91 08/22/2019   Lab Results  Component Value Date   CHOLHDL 3.8 01/07/2019   Lab Results  Component Value Date   HGBA1C 6.0 08/22/2019      Assessment & Plan:  1. Controlled type 2 diabetes mellitus without complication, without long-term current use of insulin (HCC) Continue metformin Lisinopril -blood pressure well controlled 2. Hyperlipidemia, unspecified hyperlipidemia type Diet modification  3. Osteoporosis, unspecified osteoporosis type, unspecified pathological fracture presence Vit D-eat calcium containing foods 4. Ear pain, bilateral-no infection No concerns Follow-up: 6 months   Ambrose Wile Hannah Beat, MD

## 2020-01-14 ENCOUNTER — Other Ambulatory Visit: Payer: Self-pay

## 2020-01-14 ENCOUNTER — Ambulatory Visit (INDEPENDENT_AMBULATORY_CARE_PROVIDER_SITE_OTHER): Payer: Medicare Other | Admitting: Family Medicine

## 2020-01-14 ENCOUNTER — Encounter: Payer: Self-pay | Admitting: Family Medicine

## 2020-01-14 VITALS — BP 117/59 | HR 77 | Temp 98.6°F | Ht <= 58 in | Wt 169.4 lb

## 2020-01-14 DIAGNOSIS — M810 Age-related osteoporosis without current pathological fracture: Secondary | ICD-10-CM | POA: Diagnosis not present

## 2020-01-14 DIAGNOSIS — E785 Hyperlipidemia, unspecified: Secondary | ICD-10-CM

## 2020-01-14 DIAGNOSIS — M19011 Primary osteoarthritis, right shoulder: Secondary | ICD-10-CM

## 2020-01-14 DIAGNOSIS — E119 Type 2 diabetes mellitus without complications: Secondary | ICD-10-CM | POA: Diagnosis not present

## 2020-01-14 DIAGNOSIS — K219 Gastro-esophageal reflux disease without esophagitis: Secondary | ICD-10-CM | POA: Insufficient documentation

## 2020-01-14 MED ORDER — METFORMIN HCL 500 MG PO TABS: ORAL_TABLET | ORAL | 1 refills | Status: DC

## 2020-01-14 NOTE — Patient Instructions (Addendum)
Take pepcid 20mg  twice a day-trial of 2 weeks   Gastroesophageal Reflux Disease, Adult Gastroesophageal reflux (GER) happens when acid from the stomach flows up into the tube that connects the mouth and the stomach (esophagus). Normally, food travels down the esophagus and stays in the stomach to be digested. With GER, food and stomach acid sometimes move back up into the esophagus. You may have a disease called gastroesophageal reflux disease (GERD) if the reflux: Happens often. Causes frequent or very bad symptoms. Causes problems such as damage to the esophagus. When this happens, the esophagus becomes sore and swollen (inflamed). Over time, GERD can make small holes (ulcers) in the lining of the esophagus. What are the causes? This condition is caused by a problem with the muscle between the esophagus and the stomach. When this muscle is weak or not normal, it does not close properly to keep food and acid from coming back up from the stomach. The muscle can be weak because of: Tobacco use. Pregnancy. Having a certain type of hernia (hiatal hernia). Alcohol use. Certain foods and drinks, such as coffee, chocolate, onions, and peppermint. What increases the risk? You are more likely to develop this condition if you: Are overweight. Have a disease that affects your connective tissue. Use NSAID medicines. What are the signs or symptoms? Symptoms of this condition include: Heartburn. Difficult or painful swallowing. The feeling of having a lump in the throat. A bitter taste in the mouth. Bad breath. Having a lot of saliva. Having an upset or bloated stomach. Belching. Chest pain. Different conditions can cause chest pain. Make sure you see your doctor if you have chest pain. Shortness of breath or noisy breathing (wheezing). Ongoing (chronic) cough or a cough at night. Wearing away of the surface of teeth (tooth enamel). Weight loss. How is this treated? Treatment will depend on  how bad your symptoms are. Your doctor may suggest: Changes to your diet. Medicine. Surgery. Follow these instructions at home: Eating and drinking  Follow a diet as told by your doctor. You may need to avoid foods and drinks such as: Coffee and tea (with or without caffeine). Drinks that contain alcohol. Energy drinks and sports drinks. Bubbly (carbonated) drinks or sodas. Chocolate and cocoa. Peppermint and mint flavorings. Garlic and onions. Horseradish. Spicy and acidic foods. These include peppers, chili powder, curry powder, vinegar, hot sauces, and BBQ sauce. Citrus fruit juices and citrus fruits, such as oranges, lemons, and limes. Tomato-based foods. These include red sauce, chili, salsa, and pizza with red sauce. Fried and fatty foods. These include donuts, french fries, potato chips, and high-fat dressings. High-fat meats. These include hot dogs, rib eye steak, sausage, ham, and bacon. High-fat dairy items, such as whole milk, butter, and cream cheese. Eat small meals often. Avoid eating large meals. Avoid drinking large amounts of liquid with your meals. Avoid eating meals during the 2-3 hours before bedtime. Avoid lying down right after you eat. Do not exercise right after you eat. Lifestyle  Do not use any products that contain nicotine or tobacco. These include cigarettes, e-cigarettes, and chewing tobacco. If you need help quitting, ask your doctor. Try to lower your stress. If you need help doing this, ask your doctor. If you are overweight, lose an amount of weight that is healthy for you. Ask your doctor about a safe weight loss goal. General instructions Pay attention to any changes in your symptoms. Take over-the-counter and prescription medicines only as told by your doctor. Do not  take aspirin, ibuprofen, or other NSAIDs unless your doctor says it is okay. Wear loose clothes. Do not wear anything tight around your waist. Raise (elevate) the head of your bed  about 6 inches (15 cm). Avoid bending over if this makes your symptoms worse. Keep all follow-up visits as told by your doctor. This is important. Contact a doctor if: You have new symptoms. You lose weight and you do not know why. You have trouble swallowing or it hurts to swallow. You have wheezing or a cough that keeps happening. Your symptoms do not get better with treatment. You have a hoarse voice. Get help right away if: You have pain in your arms, neck, jaw, teeth, or back. You feel sweaty, dizzy, or light-headed. You have chest pain or shortness of breath. You throw up (vomit) and your throw-up looks like blood or coffee grounds. You pass out (faint). Your poop (stool) is bloody or black. You cannot swallow, drink, or eat. Summary If a person has gastroesophageal reflux disease (GERD), food and stomach acid move back up into the esophagus and cause symptoms or problems such as damage to the esophagus. Treatment will depend on how bad your symptoms are. Follow a diet as told by your doctor. Take all medicines only as told by your doctor. This information is not intended to replace advice given to you by your health care provider. Make sure you discuss any questions you have with your health care provider. Document Revised: 06/05/2018 Document Reviewed: 06/05/2018 Elsevier Patient Education  2020 [place] for Gastroesophageal Reflux Disease, Adult When you have gastroesophageal reflux disease (GERD), the foods you eat and your eating habits are very important. Choosing the right foods can help ease your discomfort. Think about working with a nutrition specialist (dietitian) to help you make good choices. What are tips for following this plan?  Meals  Choose healthy foods that are low in fat, such as fruits, vegetables, whole grains, low-fat dairy products, and lean meat, fish, and poultry.  Eat small meals often instead of 3 large meals a day. Eat your  meals slowly, and in a place where you are relaxed. Avoid bending over or lying down until 2-3 hours after eating.  Avoid eating meals 2-3 hours before bed.  Avoid drinking a lot of liquid with meals.  Cook foods using methods other than frying. Bake, grill, or broil food instead.  Avoid or limit: ? Chocolate. ? Peppermint or spearmint. ? Alcohol. ? Pepper. ? Black and decaffeinated coffee. ? Black and decaffeinated tea. ? Bubbly (carbonated) soft drinks. ? Caffeinated energy drinks and soft drinks.  Limit high-fat foods such as: ? Fatty meat or fried foods. ? Whole milk, cream, butter, or ice cream. ? Nuts and nut butters. ? Pastries, donuts, and sweets made with butter or shortening.  Avoid foods that cause symptoms. These foods may be different for everyone. Common foods that cause symptoms include: ? Tomatoes. ? Oranges, lemons, and limes. ? Peppers. ? Spicy food. ? Onions and garlic. ? Vinegar. Lifestyle  Maintain a healthy weight. Ask your doctor what weight is healthy for you. If you need to lose weight, work with your doctor to do so safely.  Exercise for at least 30 minutes for 5 or more days each week, or as told by your doctor.  Wear loose-fitting clothes.  Do not smoke. If you need help quitting, ask your doctor.  Sleep with the head of your bed higher than your feet. Use a  wedge under the mattress or blocks under the bed frame to raise the head of the bed. Summary  When you have gastroesophageal reflux disease (GERD), food and lifestyle choices are very important in easing your symptoms.  Eat small meals often instead of 3 large meals a day. Eat your meals slowly, and in a place where you are relaxed.  Limit high-fat foods such as fatty meat or fried foods.  Avoid bending over or lying down until 2-3 hours after eating.  Avoid peppermint and spearmint, caffeine, alcohol, and chocolate. This information is not intended to replace advice given to you by  your health care provider. Make sure you discuss any questions you have with your health care provider. Document Revised: 03/20/2019 Document Reviewed: 01/02/2017 Elsevier Patient Education  [place].   COVID-19 Vaccine Information can be found at: ShippingScam.co.uk For questions related to vaccine distribution or appointments, please email [email or web address] or call 7787946826.    Take Tylenol for right shoulder pain-500mg  WITH FOOD three times a day Vit D 1000IU daily

## 2020-01-14 NOTE — Progress Notes (Signed)
Established Patient Office Visit  Subjective:  Patient ID: Kimberly Best, female    DOB: 1933/01/18  Age: 86 y.o. MRN: [id number]  CC:  Chief Complaint  Patient presents with  . Diabetes    follow up  . Hypertension    HPI Kimberly Best presents cough-has improved with mucinex GERD-Tums taken for reflux, no other medication, no vomiting. One episode of diarrhea-no blood. No vomiting.  Pt has pepcid at home.  Pt has not taken the medication  DM-taking metformin daily Breast cancer-10/20-mammogram normal  Past Medical History:  Diagnosis Date  . Allergy   . Anxiety   . Arthritis   . Cancer [facility])    right breast cancer  . Diabetes mellitus without complication ([place])   . GERD (gastroesophageal reflux disease)   . Hypertension   . Pessary maintenance 03/10/2015    Past Surgical History:  Procedure Laterality Date  . BREAST SURGERY Right    lumpectomy   . CHOLECYSTECTOMY    . EYE SURGERY      Family History  Problem Relation Age of Onset  . Cancer Father   . Stroke Mother   . Hypertension Mother   . Diabetes Sister   . Hypertension Sister   . Cancer Brother   . Hypertension Brother   . Bipolar disorder Daughter   . Hypertension Maternal Grandmother   . Tuberculosis Paternal Grandmother   . Hypertension Paternal Grandmother   . Alcohol abuse Paternal Grandfather   . Hypertension Paternal Grandfather     Social History   Socioeconomic History  . Marital status: Married    Spouse name: Not on file  . Number of children: Not on file  . Years of education: Not on file  . Highest education level: Not on file  Occupational History  . Not on file  Tobacco Use  . Smoking status: Never Smoker  . Smokeless tobacco: Never Used  Substance and Sexual Activity  . Alcohol use: No  . Drug use: No  . Sexual activity: Not Currently    Birth control/protection: Post-menopausal  Other Topics Concern  . Not on file  Social History Narrative  . Not on file    Social Determinants of Health   Financial Resource Strain:   . Difficulty of Paying Living Expenses: Not on file  Food Insecurity:   . Worried About Charity fundraiser in the Last Year: Not on file  . Ran Out of Food in the Last Year: Not on file  Transportation Needs:   . Lack of Transportation (Medical): Not on file  . Lack of Transportation (Non-Medical): Not on file  Physical Activity:   . Days of Exercise per Week: Not on file  . Minutes of Exercise per Session: Not on file  Stress:   . Feeling of Stress : Not on file  Social Connections:   . Frequency of Communication with Friends and Family: Not on file  . Frequency of Social Gatherings with Friends and Family: Not on file  . Attends Religious Services: Not on file  . Active Member of Clubs or Organizations: Not on file  . Attends Archivist Meetings: Not on file  . Marital Status: Not on file  Intimate Partner Violence:   . Fear of Current or Ex-Partner: Not on file  . Emotionally Abused: Not on file  . Physically Abused: Not on file  . Sexually Abused: Not on file    Outpatient Medications Prior to Visit  Medication Sig Dispense  Refill  . aspirin 325 MG tablet Take 325 mg by mouth daily as needed for mild pain.     . cholecalciferol (VITAMIN D) 1000 UNITS tablet Take 1,000 Units by mouth daily.    Marland Kitchen lisinopril (PRINIVIL,ZESTRIL) 2.5 MG tablet Take 2.5 mg by mouth daily.    . metFORMIN (GLUCOPHAGE) 500 MG tablet Take 500 mg by mouth 2 (two) times daily with a meal.    . alendronate (FOSAMAX) 70 MG tablet Take 70 mg by mouth once a week. Take on Wednesday with a full glass of water on an empty stomach.    . ALPRAZolam (XANAX) 0.5 MG tablet Take 0.5 mg by mouth 2 (two) times daily as needed for anxiety.     . fluticasone (FLONASE) 50 MCG/ACT nasal spray Place 2 sprays into both nostrils 3 (three) times daily as needed for allergies or rhinitis.    Marland Kitchen meclizine (ANTIVERT) 25 MG tablet Take 25 mg by mouth 2  (two) times daily as needed for dizziness.      No facility-administered medications prior to visit.    Allergies  Allergen Reactions  . Ace Inhibitors   . Penicillins Swelling    Has patient had a PCN reaction causing immediate rash, facial/tongue/throat swelling, SOB or lightheadedness with hypotension: Yes Has patient had a PCN reaction causing severe rash involving mucus membranes or skin necrosis: No Has patient had a PCN reaction that required hospitalization: No Has patient had a PCN reaction occurring within the last 10 years: No If all of the above answers are "NO", then may proceed with Cephalosporin use.    ROS Review of Systems  HENT: Negative.   Eyes:       Glasses  Respiratory: Negative.   Cardiovascular: Negative.   Gastrointestinal:       GERD  Genitourinary:       Pessary  Musculoskeletal: Positive for arthralgias and joint swelling.  Allergic/Immunologic: Negative.   Neurological: Negative.   Psychiatric/Behavioral: Negative.       Objective:    Physical Exam  Constitutional: She is oriented to person, place, and time. She appears well-developed and well-nourished.  HENT:  Head: Normocephalic and atraumatic.  Eyes: Conjunctivae are normal.  Cardiovascular: Normal rate, regular rhythm and normal heart sounds.  Pulmonary/Chest: Effort normal and breath sounds normal.  Musculoskeletal:        General: Tenderness present.     Comments: Posterior and anterior tenderness to palpation-FROM  Neurological: She is oriented to person, place, and time.  Psychiatric: She has a normal mood and affect.    BP (!) 117/59 (BP Location: Left Arm, Patient Position: Sitting, Cuff Size: Normal)   Pulse 77   Temp 98.6 F (37 C) (Oral)   Ht 4\' 10"  (1.473 m)   Wt 169 lb 6.4 oz (76.8 kg)   BMI 35.40 kg/m  Wt Readings from Last 3 Encounters:  01/14/20 169 lb 6.4 oz (76.8 kg)  12/17/19 168 lb 12.8 oz (76.6 kg)  09/16/19 165 lb 12.8 oz (75.2 kg)     Health  Maintenance Due  Topic Date Due  . TETANUS/TDAP  05/08/1954  . DEXA SCAN  05/08/2000   Lab Results  Component Value Date   TSH 0.889 01/07/2019   Lab Results  Component Value Date   WBC 4.7 01/07/2019   HGB 10.0 (L) 01/07/2019   HCT 32.7 (L) 01/07/2019   MCV 87.9 01/07/2019   PLT 231 01/07/2019   Lab Results  Component Value Date   NA 142  08/22/2019   K 5.0 08/22/2019   CO2 25 01/07/2019   GLUCOSE 107 (H) 01/07/2019   BUN 19 08/22/2019   CREATININE 0.9 08/22/2019   BILITOT 0.5 01/07/2019   ALKPHOS 45 01/07/2019   AST 16 01/07/2019   ALT 8 01/07/2019   PROT 7.2 01/07/2019   ALBUMIN 3.8 01/07/2019   CALCIUM 9.4 01/07/2019   ANIONGAP 7 01/07/2019   Lab Results  Component Value Date   CHOL 218 (A) 08/22/2019   Lab Results  Component Value Date   HDL 50 08/22/2019   Lab Results  Component Value Date   LDLCALC 152 08/22/2019   Lab Results  Component Value Date   TRIG 91 08/22/2019   Lab Results  Component Value Date   CHOLHDL 3.8 01/07/2019   Lab Results  Component Value Date   HGBA1C 6.0 08/22/2019      Assessment & Plan:  1. Controlled type 2 diabetes mellitus without complication, without long-term current use of insulin (HCC) Metformin qd or BID-always with food-glucose fasting 84, 86, 92.  After supper-100. D/w pt taking one/day with largest meal in the middle of the day-recheck in 4 months. Lisinopril  daily  2. Hyperlipidemia, unspecified hyperlipidemia type LDL 152-no medication currently  3. Osteoporosis, unspecified osteoporosis type, unspecified pathological fracture presence Fosamax taken for 14 years, taking Vit D daily  4. Gastroesophageal reflux disease without esophagitis pepcid-trial BID.    5. Osteoarthritis of right shoulder, unspecified osteoarthritis type Tylenol-otc  Follow-up: 4 months-recheck  Call if shoulder has not improved Idrees Quam Hannah Beat, MD

## 2020-03-22 ENCOUNTER — Encounter: Payer: Self-pay | Admitting: Family Medicine

## 2020-03-22 ENCOUNTER — Other Ambulatory Visit: Payer: Self-pay

## 2020-03-22 ENCOUNTER — Ambulatory Visit (INDEPENDENT_AMBULATORY_CARE_PROVIDER_SITE_OTHER): Payer: Medicare Other | Admitting: Family Medicine

## 2020-03-22 VITALS — BP 140/69 | HR 94 | Temp 97.9°F | Ht <= 58 in | Wt 172.0 lb

## 2020-03-22 DIAGNOSIS — E785 Hyperlipidemia, unspecified: Secondary | ICD-10-CM

## 2020-03-22 DIAGNOSIS — M25552 Pain in left hip: Secondary | ICD-10-CM

## 2020-03-22 DIAGNOSIS — K219 Gastro-esophageal reflux disease without esophagitis: Secondary | ICD-10-CM

## 2020-03-22 DIAGNOSIS — E119 Type 2 diabetes mellitus without complications: Secondary | ICD-10-CM

## 2020-03-22 DIAGNOSIS — M25512 Pain in left shoulder: Secondary | ICD-10-CM

## 2020-03-22 DIAGNOSIS — M810 Age-related osteoporosis without current pathological fracture: Secondary | ICD-10-CM | POA: Diagnosis not present

## 2020-03-22 DIAGNOSIS — M25511 Pain in right shoulder: Secondary | ICD-10-CM

## 2020-03-22 DIAGNOSIS — M25551 Pain in right hip: Secondary | ICD-10-CM

## 2020-03-22 NOTE — Progress Notes (Signed)
Established Patient Office Visit  Subjective:  Patient ID: Kimberly Best, female    DOB: 1933/06/21  Age: 86 y.o. MRN: [id number]  CC:  Chief Complaint  Patient presents with  . Leg Pain    pain in left leg. Leg feels like its going to buckle on me. Pain in arm shoulder as well. Pain been going on for a while about a year    HPI Kimberly Best presents for bilat shoulder pain-no recent fall. Pt with chronic back  With radiation to the right hip. Pt with LE edema.pt with no injections in the past. Pt with back pain chronically. Pt states arthritis worsening in the shoulders. Tylenol topped  DM-metformin qd-A1c 6.0%, lipid panel TC 218/LDL 152/Triglycerides 91   Past Medical History:  Diagnosis Date  . Allergy   . Anxiety   . Arthritis   . Cancer [facility])    right breast cancer  . Diabetes mellitus without complication ([place])   . GERD (gastroesophageal reflux disease)   . Hypertension   . Pessary maintenance 03/10/2015    Past Surgical History:  Procedure Laterality Date  . BREAST SURGERY Right    lumpectomy   . CHOLECYSTECTOMY    . EYE SURGERY      Family History  Problem Relation Age of Onset  . Cancer Father   . Stroke Mother   . Hypertension Mother   . Diabetes Sister   . Hypertension Sister   . Cancer Brother   . Hypertension Brother   . Bipolar disorder Daughter   . Hypertension Maternal Grandmother   . Tuberculosis Paternal Grandmother   . Hypertension Paternal Grandmother   . Alcohol abuse Paternal Grandfather   . Hypertension Paternal Grandfather     Social History   Socioeconomic History  . Marital status: Married    Spouse name: Not on file  . Number of children: Not on file  . Years of education: Not on file  . Highest education level: Not on file  Occupational History  . Not on file  Tobacco Use  . Smoking status: Never Smoker  . Smokeless tobacco: Never Used  Substance and Sexual Activity  . Alcohol use: No  . Drug use: No  . Sexual  activity: Not Currently    Birth control/protection: Post-menopausal  Other Topics Concern  . Not on file  Social History Narrative  . Not on file   Social Determinants of Health   Financial Resource Strain:   . Difficulty of Paying Living Expenses:   Food Insecurity:   . Worried About Charity fundraiser in the Last Year:   . Arboriculturist in the Last Year:   Transportation Needs:   . Film/video editor (Medical):   Marland Kitchen Lack of Transportation (Non-Medical):   Physical Activity:   . Days of Exercise per Week:   . Minutes of Exercise per Session:   Stress:   . Feeling of Stress :   Social Connections:   . Frequency of Communication with Friends and Family:   . Frequency of Social Gatherings with Friends and Family:   . Attends Religious Services:   . Active Member of Clubs or Organizations:   . Attends Archivist Meetings:   Marland Kitchen Marital Status:   Intimate Partner Violence:   . Fear of Current or Ex-Partner:   . Emotionally Abused:   Marland Kitchen Physically Abused:   . Sexually Abused:     Outpatient Medications Prior to Visit  Medication Sig  Dispense Refill  . aspirin 325 MG tablet Take 325 mg by mouth daily as needed for mild pain.     . cholecalciferol (VITAMIN D) 1000 UNITS tablet Take 1,000 Units by mouth daily.    . metFORMIN (GLUCOPHAGE) 500 MG tablet Take one with largest meal 90 tablet 1   No facility-administered medications prior to visit.    Allergies  Allergen Reactions  . Ace Inhibitors   . Penicillins Swelling    Has patient had a PCN reaction causing immediate rash, facial/tongue/throat swelling, SOB or lightheadedness with hypotension: Yes Has patient had a PCN reaction causing severe rash involving mucus membranes or skin necrosis: No Has patient had a PCN reaction that required hospitalization: No Has patient had a PCN reaction occurring within the last 10 years: No If all of the above answers are "NO", then may proceed with Cephalosporin use.     ROS Review of Systems  Constitutional: Negative.   Eyes:       Glasses  Respiratory: Negative.   Endocrine:       DM  Musculoskeletal: Positive for arthralgias and back pain.      Objective:    Physical Exam  Constitutional: She is oriented to person, place, and time. She appears well-developed and well-nourished.  HENT:  Head: Normocephalic and atraumatic.  Eyes: Conjunctivae are normal.  Cardiovascular: Normal rate, regular rhythm, normal heart sounds and intact distal pulses.  Pulmonary/Chest: Effort normal and breath sounds normal.  Musculoskeletal:        General: Tenderness present.  Neurological: She is oriented to person, place, and time.  Psychiatric: She has a normal mood and affect.    BP 140/69 (BP Location: Right Arm, Patient Position: Sitting, Cuff Size: Large)   Pulse 94   Temp 97.9 F (36.6 C) (Temporal)   Ht 4\' 10"  (1.473 m)   Wt 172 lb (78 kg)   SpO2 97%   BMI 35.95 kg/m  Wt Readings from Last 3 Encounters:  03/22/20 172 lb (78 kg)  01/14/20 169 lb 6.4 oz (76.8 kg)  12/17/19 168 lb 12.8 oz (76.6 kg)     Health Maintenance Due  Topic Date Due  . TETANUS/TDAP  Never done  . DEXA SCAN  Never done  . HEMOGLOBIN A1C  03/16/2020    Lab Results  Component Value Date   TSH 0.889 01/07/2019   Lab Results  Component Value Date   WBC 4.7 01/07/2019   HGB 10.0 (L) 01/07/2019   HCT 32.7 (L) 01/07/2019   MCV 87.9 01/07/2019   PLT 231 01/07/2019   Lab Results  Component Value Date   NA 142 08/22/2019   K 5.0 08/22/2019   CO2 25 01/07/2019   GLUCOSE 107 (H) 01/07/2019   BUN 19 08/22/2019   CREATININE 0.9 08/22/2019   BILITOT 0.5 01/07/2019   ALKPHOS 45 01/07/2019   AST 16 01/07/2019   ALT 8 01/07/2019   PROT 7.2 01/07/2019   ALBUMIN 3.8 01/07/2019   CALCIUM 9.4 01/07/2019   ANIONGAP 7 01/07/2019   Lab Results  Component Value Date   CHOL 218 (A) 08/22/2019   Lab Results  Component Value Date   HDL 50 08/22/2019   Lab  Results  Component Value Date   LDLCALC 152 08/22/2019   Lab Results  Component Value Date   TRIG 91 08/22/2019   Lab Results  Component Value Date   CHOLHDL 3.8 01/07/2019   Lab Results  Component Value Date   HGBA1C 6.0 08/22/2019  Assessment & Plan:  1. Controlled type 2 diabetes mellitus without complication, without long-term current use of insulin (HCC) A1c 6.0% - Hemoglobin A1c - TSH  2. Hyperlipidemia, unspecified hyperlipidemia type No medication - COMPLETE METABOLIC PANEL WITH GFR - TSH  3. Osteoporosis, unspecified osteoporosis type, unspecified pathological fracture presence - TSH - VITAMIN D 25 Hydroxy (Vit-D Deficiency, Fractures)  4. Gastroesophageal reflux disease without esophagitis No medication - TSH - CBC w/Diff/Platelet  5. Bilateral shoulder pain, unspecified chronicity tylenol - Ambulatory referral to Orthopedic Surgery  6. Hip pain, bilateral tylenol - Ambulatory referral to Orthopedic Surgery Follow-up:ortho  Shironda Kain Hannah Beat, MD

## 2020-03-22 NOTE — Patient Instructions (Addendum)
  labwork Ortho-start taking tylenol 500mg  -max 6 /day(3 grams Physical therapy referral  If you have lab work done today you will be contacted with your lab results within the next 2 weeks.  If you have not heard from [place] then please contact us. The fastest way to get your results is to register for My Chart.   IF you received an x-ray today, you will receive an invoice from [facility] Rad[facility]. Please contact [facility] at [phone] with questions or concerns regarding your invoice.   IF you received labwork today, you will receive an invoice from [facility]. Please contact [facility] at [phone] with questions or concerns regarding your invoice.   Our billing staff will not be able to assist you with questions regarding bills from these companies.  You will be contacted with the lab results as soon as they are available. The fastest way to get your results is to activate your My Chart account. Instructions are located on the last page of this paperwork. If you have not heard from [place] regarding the results in 2 weeks, please contact this office.

## 2020-03-23 ENCOUNTER — Other Ambulatory Visit: Payer: Self-pay | Admitting: Family Medicine

## 2020-03-23 DIAGNOSIS — D649 Anemia, unspecified: Secondary | ICD-10-CM

## 2020-03-23 LAB — COMPLETE METABOLIC PANEL WITH GFR
AG Ratio: 1.5 (calc) (ref 1.0–2.5)
ALT: 7 U/L (ref 6–29)
AST: 17 U/L (ref 10–35)
Albumin: 4.3 g/dL (ref 3.6–5.1)
Alkaline phosphatase (APISO): 62 U/L (ref 37–153)
BUN: 13 mg/dL (ref 7–25)
CO2: 29 mmol/L (ref 20–32)
Calcium: 10 mg/dL (ref 8.6–10.4)
Chloride: 101 mmol/L (ref 98–110)
Creat: 0.72 mg/dL (ref 0.60–0.88)
GFR, Est African American: 89 mL/min/{1.73_m2} (ref >=60)
GFR, Est Non African American: 77 mL/min/{1.73_m2} (ref >=60)
Globulin: 2.9 g/dL (calc) (ref 1.9–3.7)
Glucose, Bld: 87 mg/dL (ref 65–139)
Potassium: 4.3 mmol/L (ref 3.5–5.3)
Sodium: 139 mmol/L (ref 135–146)
Total Bilirubin: 0.4 mg/dL (ref 0.2–1.2)
Total Protein: 7.2 g/dL (ref 6.1–8.1)

## 2020-03-23 LAB — CBC WITH DIFFERENTIAL/PLATELET
Absolute Monocytes: 684 cells/uL (ref 200–950)
Basophils Absolute: 17 cells/uL (ref 0–200)
Basophils Relative: 0.3 %
Eosinophils Absolute: 157 cells/uL (ref 15–500)
Eosinophils Relative: 2.7 %
HCT: 31.5 % — ABNORMAL LOW (ref 35.0–45.0)
Hemoglobin: 9.9 g/dL — ABNORMAL LOW (ref 11.7–15.5)
Lymphs Abs: 2349 cells/uL (ref 850–3900)
MCH: 27.9 pg (ref 27.0–33.0)
MCHC: 31.4 g/dL — ABNORMAL LOW (ref 32.0–36.0)
MCV: 88.7 fL (ref 80.0–100.0)
MPV: 10.3 fL (ref 7.5–12.5)
Monocytes Relative: 11.8 %
Neutro Abs: 2593 cells/uL (ref 1500–7800)
Neutrophils Relative %: 44.7 %
Platelets: 314 10*3/uL (ref 140–400)
RBC: 3.55 10*6/uL — ABNORMAL LOW (ref 3.80–5.10)
RDW: 13.5 % (ref 11.0–15.0)
Total Lymphocyte: 40.5 %
WBC: 5.8 10*3/uL (ref 3.8–10.8)

## 2020-03-23 LAB — TSH: TSH: 1.19 mIU/L (ref 0.40–4.50)

## 2020-03-23 LAB — HEMOGLOBIN A1C
Hgb A1c MFr Bld: 6.2 % of total Hgb — ABNORMAL HIGH (ref <5.7)
Mean Plasma Glucose: 131 (calc)
eAG (mmol/L): 7.3 (calc)

## 2020-03-23 LAB — VITAMIN D 25 HYDROXY (VIT D DEFICIENCY, FRACTURES): Vit D, 25-Hydroxy: 30 ng/mL (ref 30–100)

## 2020-03-24 ENCOUNTER — Telehealth: Payer: Self-pay | Admitting: Emergency Medicine

## 2020-03-24 ENCOUNTER — Other Ambulatory Visit: Payer: Self-pay | Admitting: Emergency Medicine

## 2020-03-24 DIAGNOSIS — D649 Anemia, unspecified: Secondary | ICD-10-CM | POA: Diagnosis not present

## 2020-03-24 DIAGNOSIS — Z79899 Other long term (current) drug therapy: Secondary | ICD-10-CM | POA: Diagnosis not present

## 2020-03-24 NOTE — Telephone Encounter (Signed)
Patient was informed of labs resulta nd informed to go to the lab for more lab work

## 2020-03-24 NOTE — Telephone Encounter (Signed)
-----   Message from Maryruth Hancock, MD sent at 03/23/2020  5:26 PM EDT ----- I am concerned about you hemoglobin being so low. I would like for you to have additional blood work-non fasting

## 2020-03-25 ENCOUNTER — Encounter: Payer: Self-pay | Admitting: Orthopaedic Surgery

## 2020-03-25 ENCOUNTER — Ambulatory Visit (INDEPENDENT_AMBULATORY_CARE_PROVIDER_SITE_OTHER): Payer: Medicare Other | Admitting: Orthopaedic Surgery

## 2020-03-25 ENCOUNTER — Other Ambulatory Visit: Payer: Self-pay | Admitting: Family Medicine

## 2020-03-25 ENCOUNTER — Ambulatory Visit: Payer: Medicare Other

## 2020-03-25 ENCOUNTER — Other Ambulatory Visit: Payer: Self-pay

## 2020-03-25 VITALS — BP 172/69 | HR 92 | Ht <= 58 in | Wt 170.0 lb

## 2020-03-25 DIAGNOSIS — M25551 Pain in right hip: Secondary | ICD-10-CM

## 2020-03-25 DIAGNOSIS — D649 Anemia, unspecified: Secondary | ICD-10-CM

## 2020-03-25 DIAGNOSIS — M25552 Pain in left hip: Secondary | ICD-10-CM

## 2020-03-25 LAB — VITAMIN B12: Vitamin B-12: 405 pg/mL (ref 200–1100)

## 2020-03-25 LAB — IRON,TIBC AND FERRITIN PANEL
%SAT: 12 % — ABNORMAL LOW (ref 16–45)
Ferritin: 28 ng/mL (ref 16–288)
Iron: 36 ug/dL — ABNORMAL LOW (ref 45–160)
TIBC: 298 ug/dL (ref 250–450)

## 2020-03-25 MED ORDER — FERROUS SULFATE 134 MG PO TABS: 1.0000 | ORAL_TABLET | Freq: Every day | ORAL | 2 refills | Status: DC

## 2020-03-25 NOTE — Progress Notes (Signed)
Subjective:    Patient ID: Kimberly Best, female    DOB: 1933-12-11, 86 y.o.   MRN: [id number]  HPI She has long history of hip pain bilaterally, more on the left. She has no trauma.  She has been taking Tylenol with slight help.  She has seen Kimberly Best and I have reviewed the notes.  She has no numbness. She limps to the left.  She does have a cane today.  Review of Systems  Constitutional: Positive for activity change.  Musculoskeletal: Positive for arthralgias, gait problem, joint swelling and myalgias.  All other systems reviewed and are negative.  For Review of Systems, all other systems reviewed and are negative.  The following is a summary of the past history medically, past history surgically, known current medicines, social history and family history.  This information is gathered electronically by the computer from prior information and documentation.  I review this each visit and have found including this information at this point in the chart is beneficial and informative.   Past Medical History:  Diagnosis Date  . Allergy   . Anxiety   . Arthritis   . Cancer [facility])    right breast cancer  . Diabetes mellitus without complication ([place])   . GERD (gastroesophageal reflux disease)   . Hypertension   . Pessary maintenance 03/10/2015    Past Surgical History:  Procedure Laterality Date  . BREAST SURGERY Right    lumpectomy   . CHOLECYSTECTOMY    . EYE SURGERY      Current Outpatient Medications on File Prior to Visit  Medication Sig Dispense Refill  . aspirin 325 MG tablet Take 325 mg by mouth daily as needed for mild pain.     . cholecalciferol (VITAMIN D) 1000 UNITS tablet Take 1,000 Units by mouth daily.    Marland Kitchen lisinopril (ZESTRIL) 2.5 MG tablet Take 2.5 mg by mouth daily.    . metFORMIN (GLUCOPHAGE) 500 MG tablet Take one with largest meal 90 tablet 1   No current facility-administered medications on file prior to visit.    Social History   Socioeconomic  History  . Marital status: Married    Spouse name: Not on file  . Number of children: Not on file  . Years of education: Not on file  . Highest education level: Not on file  Occupational History  . Not on file  Tobacco Use  . Smoking status: Never Smoker  . Smokeless tobacco: Never Used  Substance and Sexual Activity  . Alcohol use: No  . Drug use: No  . Sexual activity: Not Currently    Birth control/protection: Post-menopausal  Other Topics Concern  . Not on file  Social History Narrative  . Not on file   Social Determinants of Health   Financial Resource Strain:   . Difficulty of Paying Living Expenses:   Food Insecurity:   . Worried About Charity fundraiser in the Last Year:   . Arboriculturist in the Last Year:   Transportation Needs:   . Film/video editor (Medical):   Marland Kitchen Lack of Transportation (Non-Medical):   Physical Activity:   . Days of Exercise per Week:   . Minutes of Exercise per Session:   Stress:   . Feeling of Stress :   Social Connections:   . Frequency of Communication with Friends and Family:   . Frequency of Social Gatherings with Friends and Family:   . Attends Religious Services:   . Active  Member of Clubs or Organizations:   . Attends Archivist Meetings:   Marland Kitchen Marital Status:   Intimate Partner Violence:   . Fear of Current or Ex-Partner:   . Emotionally Abused:   Marland Kitchen Physically Abused:   . Sexually Abused:     Family History  Problem Relation Age of Onset  . Cancer Father   . Stroke Mother   . Hypertension Mother   . Diabetes Sister   . Hypertension Sister   . Cancer Brother   . Hypertension Brother   . Bipolar disorder Daughter   . Hypertension Maternal Grandmother   . Tuberculosis Paternal Grandmother   . Hypertension Paternal Grandmother   . Alcohol abuse Paternal Grandfather   . Hypertension Paternal Grandfather     BP (!) 172/69   Pulse 92   Ht 4\' 10"  (1.473 m)   Wt 170 lb (77.1 kg)   BMI 35.53 kg/m    Body mass index is 35.53 kg/m.     Objective:   Physical Exam Vitals and nursing note reviewed.  Constitutional:      Appearance: She is well-developed.  HENT:     Head: Normocephalic and atraumatic.  Eyes:     Conjunctiva/sclera: Conjunctivae normal.     Pupils: Pupils are equal, round, and reactive to light.  Cardiovascular:     Rate and Rhythm: Normal rate and regular rhythm.  Pulmonary:     Effort: Pulmonary effort is normal.  Abdominal:     Palpations: Abdomen is soft.  Musculoskeletal:     Cervical back: Normal range of motion and neck supple.       Legs:  Skin:    General: Skin is warm and dry.  Neurological:     Mental Status: She is alert and oriented to person, place, and time.     Cranial Nerves: No cranial nerve deficit.     Motor: No abnormal muscle tone.     Coordination: Coordination normal.     Deep Tendon Reflexes: Reflexes are normal and symmetric. Reflexes normal.  Psychiatric:        Behavior: Behavior normal.        Thought Content: Thought content normal.        Judgment: Judgment normal.      X-rays were done of both hips, reported separately.     Assessment & Plan:   Encounter Diagnosis  Name Primary?  . Hip pain, bilateral Yes   She has worn out the left hip.  She has significant DJD of the hip.  I will have her try Aleve one bid after eating.  Continue the cane.  Return in three weeks.  She is a possible candidate for total hip.  Call if any problem.  Precautions discussed.   Electronically Signed Sanjuana Kava, MD 4/15/20212:00 PM

## 2020-03-30 ENCOUNTER — Encounter: Payer: Self-pay | Admitting: Internal Medicine

## 2020-04-01 ENCOUNTER — Other Ambulatory Visit: Payer: Self-pay

## 2020-04-01 ENCOUNTER — Encounter (HOSPITAL_COMMUNITY): Payer: Self-pay | Admitting: Physical Therapy

## 2020-04-01 ENCOUNTER — Encounter (HOSPITAL_COMMUNITY): Payer: Self-pay

## 2020-04-01 ENCOUNTER — Ambulatory Visit (HOSPITAL_COMMUNITY): Payer: Medicare Other | Attending: Family Medicine

## 2020-04-01 ENCOUNTER — Ambulatory Visit (HOSPITAL_COMMUNITY): Payer: Medicare Other | Admitting: Physical Therapy

## 2020-04-01 DIAGNOSIS — M25612 Stiffness of left shoulder, not elsewhere classified: Secondary | ICD-10-CM

## 2020-04-01 DIAGNOSIS — M25512 Pain in left shoulder: Secondary | ICD-10-CM | POA: Diagnosis not present

## 2020-04-01 DIAGNOSIS — R29898 Other symptoms and signs involving the musculoskeletal system: Secondary | ICD-10-CM | POA: Insufficient documentation

## 2020-04-01 DIAGNOSIS — M25511 Pain in right shoulder: Secondary | ICD-10-CM | POA: Insufficient documentation

## 2020-04-01 DIAGNOSIS — R2689 Other abnormalities of gait and mobility: Secondary | ICD-10-CM | POA: Diagnosis not present

## 2020-04-01 DIAGNOSIS — M6281 Muscle weakness (generalized): Secondary | ICD-10-CM | POA: Insufficient documentation

## 2020-04-01 DIAGNOSIS — M25611 Stiffness of right shoulder, not elsewhere classified: Secondary | ICD-10-CM | POA: Diagnosis not present

## 2020-04-01 DIAGNOSIS — G8929 Other chronic pain: Secondary | ICD-10-CM | POA: Diagnosis not present

## 2020-04-01 NOTE — Therapy (Signed)
[place], [place], [address] Phone: [phone]   Fax:  [phone]  Occupational Therapy Evaluation  Patient Details  Name: Kimberly Best MRN: [id number] Date of Birth: 1933/10/07 Referring Provider (OT): Benny Lennert, MD   Encounter Date: 04/01/2020  OT End of Session - 04/01/20 1023    Visit Number  1    Number of Visits  8    Date for OT Re-Evaluation  04/29/20    Authorization Type  [employer] medicare    Authorization Time Period  No authorization. No visit limit $30 copay    Progress Note Due on Visit  10    OT Start Time  [phone]   Patient required 10 minutes to walk from waiting room to eval room.   OT Stop Time  0910    OT Time Calculation (min)  55 min    Activity Tolerance  Patient tolerated treatment well    Behavior During Therapy  WFL for tasks assessed/performed       Past Medical History:  Diagnosis Date  . Allergy   . Anxiety   . Arthritis   . Cancer [facility])    right breast cancer  . Diabetes mellitus without complication ([place])   . GERD (gastroesophageal reflux disease)   . Hypertension   . Pessary maintenance 03/10/2015    Past Surgical History:  Procedure Laterality Date  . BREAST SURGERY Right    lumpectomy   . CHOLECYSTECTOMY    . EYE SURGERY      There were no vitals filed for this visit.  Subjective Assessment - 04/01/20 0830    Subjective   S: I've been feeling like my shoulder a little more stiff recently.    Pertinent History  Patient is a 86 y/o female S/P bilateral shoulder pain caused by arthritis who recently reports increased joint stiffness and occassional pain. Dr. Holly Bodily has referred patient to occupational therapy for evaluation and treatment.    Patient Stated Goals  To increase her shoulder movement and decrease pain if possible.    Currently in Pain?  No/denies   Only pain with certain movement and with right side sleeping       [facility] OT Assessment - 04/01/20 0831       Assessment   Medical Diagnosis  Bilateral shoulder pain    Referring Provider (OT)  Benny Lennert, MD    Onset Date/Surgical Date  --   2 weeks ago   Hand Dominance  Right    Prior Therapy  None for shoulders      Precautions   Precautions  None    Precaution Comments  very slow moving      Restrictions   Weight Bearing Restrictions  No      Balance Screen   Has the patient fallen in the past 6 months  No      Home  Environment   Family/patient expects to be discharged to:  Private residence    Living Arrangements  Alone    Available Help at Discharge  Family    Type of [place]  One level    Bathroom Shower/Tub  [place] seat      Prior Function   Level of [place] device for independence    Vocation  Retired    Leisure  enjoy cooking      ADL   ADL  comments  Difficulty with upper body dressing, decreased mobility and increased pain.       Mobility   Mobility Status  Independent      Written Expression   Dominant Hand  Right      Vision - History   Baseline Vision  Wears glasses all the time      Cognition   Overall Cognitive Status  Within Functional Limits for tasks assessed      Observation/Other Assessments   Focus on Therapeutic Outcomes (FOTO)   54/100      ROM / Strength   AROM / PROM / Strength  AROM;PROM;Strength      Palpation   Palpation comment  Min fascial restrictions in bilateral shoulder anterior region.      AROM   Overall AROM Comments  Assessed seated. IR/er adducted    AROM Assessment Site  Shoulder    Right/Left Shoulder  Right;Left    Right Shoulder Flexion  145 Degrees    Right Shoulder ABduction  135 Degrees    Right Shoulder Internal Rotation  90 Degrees    Right Shoulder External Rotation  57 Degrees    Left Shoulder Flexion  135 Degrees    Left Shoulder ABduction  128 Degrees    Left Shoulder Internal Rotation  90 Degrees    Left Shoulder External  Rotation  60 Degrees      PROM   Overall PROM   Within functional limits for tasks performed    Overall PROM Comments  Assessed seated. IR/er adducted. WFL in all shoulder ranges.       Strength   Overall Strength Comments  Assessed seated. IR/er adducted    Strength Assessment Site  Shoulder    Right/Left Shoulder  Left;Right    Right Shoulder Flexion  4+/5    Right Shoulder ABduction  4+/5    Right Shoulder Internal Rotation  4+/5    Right Shoulder External Rotation  4/5    Left Shoulder Flexion  4+/5    Left Shoulder ABduction  4+/5    Left Shoulder Internal Rotation  4+/5    Left Shoulder External Rotation  4/5                      OT Education - 04/01/20 1023    Education Details  seated AA/ROM shoulder exercises.    Person(s) Educated  Patient    Methods  Explanation;Demonstration;Verbal cues;Handout    Comprehension  Returned demonstration;Verbalized understanding       OT Short Term Goals - 04/01/20 1041      OT SHORT TERM GOAL #1   Title  Patient will be educated and independent with HEP in order to increase her BUE mobility and allow her to complete basic required tasks with less difficulty and pain.    Time  4    Period  Weeks    Status  New    Target Date  04/29/20      OT SHORT TERM GOAL #2   Title  Patient will increase her BUE shoulder A/ROM in order to reach behind her back and/or above shoulder level with less difficulty.    Time  4    Period  Weeks    Status  New      OT SHORT TERM GOAL #3   Title  Patient will increase shoulder stability while demonstrating or verbalizing her ability to complete overhead reach or sustained reaching tasks for approximately 1-2 minutes with no rest  breaks.    Time  4    Period  Weeks    Status  New      OT SHORT TERM GOAL #4   Title  Patient decrease fascial restrictions to trace amount in order to increase functional mobility needed to complete reaching and dressing tasks.    Time  4    Period   Weeks    Status  New      OT SHORT TERM GOAL #5   Title  Patient will report a decrease in pain in BUE or approximately 3/10 or less while completing daily tasks.    Time  4    Period  Weeks    Status  New               Plan - 04/01/20 1025    Clinical Impression Statement  A:Patient is a 86 y/o female S/P bilateral shoulder pain causing decreased ROM and shoulder/scapular stability and increase ROM and fascial restrictions resulting in difficulty completing basic ADL tasks.    OT Occupational Profile and History  Problem Focused Assessment - Including review of records relating to presenting problem    Occupational performance deficits (Please refer to evaluation for details):  ADL's;Leisure;Rest and Sleep    Body Structure / Function / Physical Skills  ADL;ROM;Fascial restriction;Mobility;Strength;Pain;UE functional use    Rehab Potential  Excellent    Clinical Decision Making  Several treatment options, min-mod task modification necessary    Comorbidities Affecting Occupational Performance:  May have comorbidities impacting occupational performance    Modification or Assistance to Complete Evaluation   Min-Moderate modification of tasks or assist with assess necessary to complete eval    OT Frequency  2x / week    OT Duration  4 weeks    OT Treatment/Interventions  Self-care/ADL training;Therapeutic exercise;Ultrasound;Neuromuscular education;Manual Therapy;Therapeutic activities;Cryotherapy;Electrical Stimulation;Moist Heat;Passive range of motion;Patient/family education;DME and/or AE instruction    Plan  P: Patient will benefit from skilled OT services to increase functional performance during daily tasks using her BUE. Treatment plan: May need to complete all treatment seated depending on mobility. Myofascial release to upper trapazius and anterior shoulder region, P/ROM, AA/ROM although may progress to A/ROM next session. general scapular/shoulder strengthening to increase  stability and endurance (overhead or above shoulder activities)       Patient will benefit from skilled therapeutic intervention in order to improve the following deficits and impairments:   Body Structure / Function / Physical Skills: ADL, ROM, Fascial restriction, Mobility, Strength, Pain, UE functional use       Visit Diagnosis: Other symptoms and signs involving the musculoskeletal system - Plan: Ot plan of care cert/re-cert  Stiffness of left shoulder, not elsewhere classified - Plan: Ot plan of care cert/re-cert  Stiffness of right shoulder, not elsewhere classified - Plan: Ot plan of care cert/re-cert  Chronic left shoulder pain - Plan: Ot plan of care cert/re-cert  Chronic right shoulder pain - Plan: Ot plan of care cert/re-cert    Problem List Patient Active Problem List   Diagnosis Date Noted  . Gastroesophageal reflux disease without esophagitis 01/14/2020  . Osteoarthritis of right shoulder 01/14/2020  . Ear pain, bilateral 12/17/2019  . Osteoporosis 09/16/2019  . Controlled type 2 diabetes mellitus without complication, without long-term current use of insulin ([place]) 09/16/2019  . Hyperlipidemia 09/16/2019  . Problem with vaginal pessary ([place]) 09/03/2018  . Fitting and adjustment of pessary 03/10/2015  . Uterovaginal prolapse, complete 02/18/2015  . Complete uterovaginal prolapse 02/01/2015  . Female  stress incontinence 06/10/2014  . Urge incontinence 06/10/2014     Ailene Ravel, OTR/L,CBIS  [phone]  04/01/2020, 11:07 AM  [place], [place], [address] Phone: [phone]   Fax:  [phone]  Name: SHELMA MILLWOOD MRN: [id number] Date of Birth: [birth year]

## 2020-04-01 NOTE — Therapy (Signed)
[facility] [address] [place], [place], [address] Phone: [phone]   Fax:  [phone]  Physical Therapy Evaluation  Patient Details  Name: Kimberly Best MRN: [id number] Date of Birth: [birth year] Referring Provider (PT): Kimberly Best   Encounter Date: 04/01/2020  PT End of Session - 04/01/20 1314    Visit Number  1    Number of Visits  12    Date for PT Re-Evaluation  05/13/20    Progress Note Due on Visit  10    PT Start Time  0920    PT Stop Time  1000    PT Time Calculation (min)  40 min    Equipment Utilized During Treatment  Gait belt    Activity Tolerance  Patient tolerated treatment well    Behavior During Therapy  [facility] for tasks assessed/performed       Past Medical History:  Diagnosis Date  . Allergy   . Anxiety   . Arthritis   . Cancer [facility])    right breast cancer  . Diabetes mellitus without complication ([place])   . GERD (gastroesophageal reflux disease)   . Hypertension   . Pessary maintenance 03/10/2015    Past Surgical History:  Procedure Laterality Date  . BREAST SURGERY Right    lumpectomy   . CHOLECYSTECTOMY    . EYE SURGERY      There were no vitals filed for this visit.   Subjective Assessment - 04/01/20 0916    Subjective  Kimberly Best states that she has had significant pain in both her hips LT greater than Rt for a while. Her MD states that her Lt hip may need to be replaced.  The pt states that at her age she does not want to have the surgery therefore they are trying some antiinflammatory and therapy.    Pertinent History  OA, DM,    Limitations  Standing;Walking;House hold activities;Lifting    How long can you sit comfortably?  No problem    How long can you stand comfortably?  She now has increased pain in her Lt hip almost immediately she has to put the wt on her right leg.    How long can you walk comfortably?  3 minutes with a rolling walker    Patient Stated Goals  avoid a hip surgery    Currently in Pain?  No/denies   with weight bearing she will have pain that grabs her 8/10        [facility] PT Assessment - 04/01/20 0932      Assessment   Medical Diagnosis  Lt hip pain     Referring Provider (PT)  Kimberly Best     Hand Dominance  Right    Next MD Visit  May 6     Prior Therapy  none       Precautions   Precautions  None    Precaution Comments  very slow moving      Restrictions   Weight Bearing Restrictions  No      Balance Screen   Has the patient fallen in the past 6 months  No    Has the patient had a decrease in activity level because of a fear of falling?   Yes    Is the patient reluctant to leave their home because of a fear of falling?   Yes      Prior Function   Level of Independence  Requires assistive device for independence  Vocation  Retired    Leisure  enjoy Materials engineer Status  Within [employer] for tasks assessed      Functional Tests   Functional tests  Sit to Stand      Sit to Stand   Comments  unable without hands    using both hands 4 in 30 seconds      ROM / Strength   AROM / PROM / Strength  AROM;Strength      AROM   AROM Assessment Site  Hip    Right/Left Shoulder  Right;Left    Right/Left Hip  Right;Left    Right Hip Flexion  100    Right Hip External Rotation   25   PROM 35    Right Hip Internal Rotation   15   PROM 20    Left Hip Flexion  90   PROM 100   Left Hip External Rotation   20   PROM 30      Strength   Strength Assessment Site  Hip;Knee;Ankle    Right/Left Hip  Right;Left    Right Hip Flexion  4+/5    Right Hip Extension  2/5   tested sidelying    Right Hip ABduction  3/5    Left Hip Flexion  4/5    Left Hip Extension  2-/5   tested sidelying    Left Hip ABduction  3+/5    Right/Left Knee  Right;Left    Right Knee Flexion  4+/5    Right Knee Extension  4+/5    Left Knee Flexion  3-/5   tested sidelying    Left Knee Extension  3/5    Right/Left Ankle   Right;Left    Right Ankle Dorsiflexion  4/5    Left Ankle Dorsiflexion  3/5      Ambulation/Gait   Ambulation Distance (Feet)  84 Feet    Assistive device  Rolling walker    Gait Pattern  Step-to pattern    Gait Comments  3 minute walk test        Balance   Balance Assessed  --   unable to single leg stance                Objective measurements completed on examination: See above findings.      [facility] Adult PT Treatment/Exercise - 04/01/20 0932      Exercises   Exercises  Knee/Hip      Knee/Hip Exercises: Seated   Long Arc Quad  Both;5 reps    Other Seated Knee/Hip Exercises  ankle dorsi/plantarflexion B     Sit to [employer]  5 reps      Knee/Hip Exercises: Supine   Bridges  5 reps             PT Education - 04/01/20 1313    Education Details  aHEP    Person(s) Educated  Patient    Methods  Explanation;Handout    Comprehension  Verbalized understanding;Returned demonstration       PT Short Term Goals - 04/01/20 1434      PT SHORT TERM GOAL #1   Title  PT core and LE strength  to  increase to allow pt to be able to come from sit to stand with one UE assist    Time  3    Period  Weeks    Status  New    Target Date  04/22/20  PT SHORT TERM GOAL #2   Title  PT to be able to ambulate 120 ft with rolling walker in a 3 minute period of time to show progression of being a household ambulator.    Time  3    Period  Weeks    Status  New        PT Long Term Goals - 04/01/20 1436      PT LONG TERM GOAL #1   Title  Pt core and LE strength to increase to allow pt to be able to come sit to stand without UE assist    Time  6    Period  Weeks    Status  New    Target Date  05/13/20      PT LONG TERM GOAL #2   Title  PT to be able to stand for 10 minutes with pain no greater than a 3/10 to be able to complete self grooming activities.    Time  6    Period  Weeks    Status  New      PT LONG TERM GOAL #3   Title  PT to be able to ambulate 226 ft  with rolling walker in a 3' period time to be able to walk safely in her home.    Time  6    Period  Weeks    Status  New      PT LONG TERM GOAL #4   Title  PT to be able to single leg stance for 10 seconds to reduce risk of falling    Time  6    Period  Weeks    Status  New             Plan - 04/01/20 1426    Clinical Impression Statement  Ms. Dorthula Rue is an 86 yo female who lives by herself.  She normally uses a rollator to ambulate with but comes to the department with a cane due to the difficulty of getting the walker into the car.  She has had increased B hip pain with the Left being significantly worse than the right which has caused her to have increased difficulty with all mobility including bed mobility, transfers and ambulation.  She is being referred to skilled therapy.  Evaluation demonstrates decreased hip ROM, decreased strength, decreased balance and abnormal gait and increased pain.   Ms. Swiggum will benefit from skilled PT to address these deficits and improve her mobility as well as decrase her risk of falling.    Personal Factors and Comorbidities  Age;Fitness;Time since onset of injury/illness/exacerbation    Examination-Activity Limitations  Stand;Stairs;Squat;Sit;Carry;Transfers;Locomotion Level    Examination-Participation Restrictions  Cleaning;Meal Prep;Other    Stability/Clinical Decision Making  Evolving/Moderate complexity    Clinical Decision Making  Moderate    Rehab Potential  Good    PT Frequency  2x / week    PT Duration  6 weeks    PT Treatment/Interventions  DME Instruction;Gait training;Stair training;Functional mobility training;Therapeutic activities;Therapeutic exercise;Balance training;Patient/family education;Manual techniques    PT Next Visit Plan  Begin gait training urging pt to take longer steps while walking, knee to chest, piriformis stretch, sit to stand, transfer training, sidelying clams and hip abduction       Patient will benefit from  skilled therapeutic intervention in order to improve the following deficits and impairments:  Abnormal gait, Decreased activity tolerance, Decreased balance, Decreased mobility, Decreased range of motion, Difficulty walking, Decreased strength, Pain  Visit Diagnosis:  Other abnormalities of gait and mobility - Plan: CANCELED: PT plan of care cert/re-cert     Problem List Patient Active Problem List   Diagnosis Date Noted  . Gastroesophageal reflux disease without esophagitis 01/14/2020  . Osteoarthritis of right shoulder 01/14/2020  . Ear pain, bilateral 12/17/2019  . Osteoporosis 09/16/2019  . Controlled type 2 diabetes mellitus without complication, without long-term current use of insulin ([place]) 09/16/2019  . Hyperlipidemia 09/16/2019  . Problem with vaginal pessary ([place]) 09/03/2018  . Fitting and adjustment of pessary 03/10/2015  . Uterovaginal prolapse, complete 02/18/2015  . Complete uterovaginal prolapse 02/01/2015  . Female stress incontinence 06/10/2014  . Urge incontinence 06/10/2014    Rayetta Humphrey, PT CLT [phone] 04/01/2020, 2:49 PM  [facility] [address] [place], [place], [address] Phone: [phone]   Fax:  [phone]  Name: Kimberly Best MRN: [id number] Date of Birth: March 08, 1933

## 2020-04-01 NOTE — Patient Instructions (Signed)

## 2020-04-07 ENCOUNTER — Other Ambulatory Visit: Payer: Self-pay

## 2020-04-07 ENCOUNTER — Ambulatory Visit (HOSPITAL_COMMUNITY): Payer: Medicare Other | Admitting: Specialist

## 2020-04-07 ENCOUNTER — Ambulatory Visit (HOSPITAL_COMMUNITY): Payer: Medicare Other | Admitting: Physical Therapy

## 2020-04-07 ENCOUNTER — Encounter (HOSPITAL_COMMUNITY): Payer: Self-pay | Admitting: Specialist

## 2020-04-07 DIAGNOSIS — G8929 Other chronic pain: Secondary | ICD-10-CM

## 2020-04-07 DIAGNOSIS — R2689 Other abnormalities of gait and mobility: Secondary | ICD-10-CM | POA: Diagnosis not present

## 2020-04-07 DIAGNOSIS — M25611 Stiffness of right shoulder, not elsewhere classified: Secondary | ICD-10-CM | POA: Diagnosis not present

## 2020-04-07 DIAGNOSIS — M25511 Pain in right shoulder: Secondary | ICD-10-CM | POA: Diagnosis not present

## 2020-04-07 DIAGNOSIS — R29898 Other symptoms and signs involving the musculoskeletal system: Secondary | ICD-10-CM

## 2020-04-07 DIAGNOSIS — M25612 Stiffness of left shoulder, not elsewhere classified: Secondary | ICD-10-CM

## 2020-04-07 DIAGNOSIS — M25512 Pain in left shoulder: Secondary | ICD-10-CM | POA: Diagnosis not present

## 2020-04-07 DIAGNOSIS — M6281 Muscle weakness (generalized): Secondary | ICD-10-CM | POA: Diagnosis not present

## 2020-04-07 NOTE — Therapy (Addendum)
[place], [place], [address] Phone: [phone]   Fax:  [phone]  Occupational Therapy Treatment  Patient Details  Name: Kimberly Best MRN: [id number] Date of Birth: 05-28-1933 Referring Provider (OT): Benny Lennert, MD Bilateral Shoulder Pain  Encounter Date: 04/07/2020  OT End of Session - 04/07/20 1127    Visit Number  2    Number of Visits  8    Date for OT Re-Evaluation  04/29/20    Authorization Type  [employer] medicare    Authorization Time Period  No authorization. No visit limit $30 copay    Progress Note Due on Visit  10    OT Start Time  0900    OT Stop Time  0940    OT Time Calculation (min)  40 min    Activity Tolerance  Patient tolerated treatment well       Past Medical History:  Diagnosis Date  . Allergy   . Anxiety   . Arthritis   . Cancer [facility])    right breast cancer  . Diabetes mellitus without complication ([place])   . GERD (gastroesophageal reflux disease)   . Hypertension   . Pessary maintenance 03/10/2015    Past Surgical History:  Procedure Laterality Date  . BREAST SURGERY Right    lumpectomy   . CHOLECYSTECTOMY    . EYE SURGERY      There were no vitals filed for this visit.  Subjective Assessment - 04/07/20 1126    Subjective   S: I have been doing my exercises.    Currently in Pain?  Yes    Pain Score  4     Pain Location  Shoulder    Pain Orientation  Right;Left    Pain Descriptors / Indicators  Aching    Pain Type  Acute pain    Pain Radiating Towards  with movement    Pain Onset  More than a month ago    Pain Frequency  Intermittent    Aggravating Factors   movement    Pain Relieving Factors  rest         [facility] OT Assessment - 04/07/20 0001      Assessment   Medical Diagnosis  Bilateral Shoulder Pain     Precautions   Precautions  None    Precaution Comments  very slow moving      Restrictions   Weight Bearing Restrictions  No                OT Treatments/Exercises (OP) - 04/07/20 0001      Exercises   Exercises  Shoulder      Shoulder Exercises: Seated   Elevation  AROM;10 reps    Extension  AROM;10 reps    Row  AROM;10 reps    Protraction  AAROM;10 reps    Horizontal ABduction  AAROM;10 reps    External Rotation  PROM;AAROM;10 reps    Internal Rotation  PROM;AAROM;10 reps    Flexion  PROM;AAROM;10 reps    Abduction  PROM;AAROM;10 reps      Shoulder Exercises: ROM/Strengthening   Proximal Shoulder Strengthening, Seated  10 times with rest breaks after each sequence    Other ROM/Strengthening Exercises  seated holding volleyball between palms completed 12 repetitions of chest press, overhead press, flexion, overhead v      Manual Therapy   Manual Therapy  Myofascial release    Manual therapy comments  manual therapy completed seperately from all other  interventions this date    Myofascial Release  myofascial release and manual stretching to bilateral upper arms, scapular, and shoulder regions iwth paitent in seated position.                OT Short Term Goals - 04/07/20 1129      OT SHORT TERM GOAL #1   Title  Patient will be educated and independent with HEP in order to increase her BUE mobility and allow her to complete basic required tasks with less difficulty and pain.    Time  4    Period  Weeks    Status  On-going    Target Date  04/29/20      OT SHORT TERM GOAL #2   Title  Patient will increase her BUE shoulder A/ROM in order to reach behind her back and/or above shoulder level with less difficulty.    Time  4    Period  Weeks    Status  On-going      OT SHORT TERM GOAL #3   Title  Patient will increase shoulder stability while demonstrating or verbalizing her ability to complete overhead reach or sustained reaching tasks for approximately 1-2 minutes with no rest breaks.    Time  4    Period  Weeks    Status  On-going      OT SHORT TERM GOAL #4   Title  Patient  decrease fascial restrictions to trace amount in order to increase functional mobility needed to complete reaching and dressing tasks.    Time  4    Period  Weeks    Status  On-going      OT SHORT TERM GOAL #5   Title  Patient will report a decrease in pain in BUE or approximately 3/10 or less while completing daily tasks.    Time  4    Period  Weeks    Status  On-going               Plan - 04/07/20 1127    Clinical Impression Statement  A:  completed manual therapy with patient in seated position.  paitent with significant fascial restrictions in bilateral scapular regions.  Patient demonstrated good form with aa/rom exercises in seated.    Body Structure / Function / Physical Skills  ADL;ROM;Fascial restriction;Mobility;Strength;Pain;UE functional use    Plan  P:  continue skilled OT intervention to improve scapular stability in bilateral shoulders to improve functional reaching.  continue therapy ball exercises in seated with basketball, attempt a/rom in supine as able.       Patient will benefit from skilled therapeutic intervention in order to improve the following deficits and impairments:   Body Structure / Function / Physical Skills: ADL, ROM, Fascial restriction, Mobility, Strength, Pain, UE functional use       Visit Diagnosis: Stiffness of left shoulder, not elsewhere classified  Stiffness of right shoulder, not elsewhere classified  Chronic left shoulder pain  Chronic right shoulder pain    Problem List Patient Active Problem List   Diagnosis Date Noted  . Gastroesophageal reflux disease without esophagitis 01/14/2020  . Osteoarthritis of right shoulder 01/14/2020  . Ear pain, bilateral 12/17/2019  . Osteoporosis 09/16/2019  . Controlled type 2 diabetes mellitus without complication, without long-term current use of insulin ([place]) 09/16/2019  . Hyperlipidemia 09/16/2019  . Problem with vaginal pessary ([place]) 09/03/2018  . Fitting and adjustment of  pessary 03/10/2015  . Uterovaginal prolapse, complete 02/18/2015  . Complete uterovaginal prolapse  02/01/2015  . Female stress incontinence 06/10/2014  . Urge incontinence 06/10/2014    Vangie Bicker, [place], OTR/L [phone]  04/07/2020, 11:37 AM  [place], [place], [address] Phone: [phone]   Fax:  [phone]  Name: Kimberly Best MRN: [id number] Date of Birth: 1933/07/09

## 2020-04-07 NOTE — Therapy (Signed)
[place], [place], [address] Phone: [phone]   Fax:  [phone]  Physical Therapy Treatment  Patient Details  Name: Kimberly Best MRN: [id number] Date of Birth: 1933/04/14 Referring Provider (PT): Benny Lennert   Encounter Date: 04/07/2020  PT End of Session - 04/07/20 0849    Visit Number  2    Number of Visits  12    Date for PT Re-Evaluation  05/13/20    Progress Note Due on Visit  10    PT Start Time  0816    PT Stop Time  0858    PT Time Calculation (min)  42 min    Equipment Utilized During Treatment  Gait belt    Activity Tolerance  Patient tolerated treatment well    Behavior During Therapy  [facility] for tasks assessed/performed       Past Medical History:  Diagnosis Date  . Allergy   . Anxiety   . Arthritis   . Cancer [facility])    right breast cancer  . Diabetes mellitus without complication ([place])   . GERD (gastroesophageal reflux disease)   . Hypertension   . Pessary maintenance 03/10/2015    Past Surgical History:  Procedure Laterality Date  . BREAST SURGERY Right    lumpectomy   . CHOLECYSTECTOMY    . EYE SURGERY      There were no vitals filed for this visit.  Subjective Assessment - 04/07/20 0821    Subjective  pt states her hips are hurting at 7/10 with Lt more painful than Rt.  States she's been doing her HEP and she is a little sore mostly in her legs    Currently in Pain?  Yes    Pain Score  7     Pain Location  Hip    Pain Orientation  Left    Pain Descriptors / Indicators  Aching;Shooting;Sore    Pain Type  Chronic pain                       [facility] Adult PT Treatment/Exercise - 04/07/20 0001      Ambulation/Gait   Gait Comments  with FWW urging to take larger steps      Knee/Hip Exercises: Stretches   Piriformis Stretch  Both;2 reps;30 seconds;Limitations    Piriformis Stretch Limitations  PROM by therapist (pt very tight)      Knee/Hip Exercises: Seated    Long Arc Quad  Both;10 reps    Other Seated Knee/Hip Exercises  ankle dorsi/plantarflexion bil 10 reps    Sit to Sand  10 reps;without UE support      Knee/Hip Exercises: Supine   Bridges  10 reps      Knee/Hip Exercises: Sidelying   Hip ABduction  Both;10 reps    Clams  both 10 reps with 3" holds      Knee/Hip Exercises: Prone   Hamstring Curl  10 reps    Hip Extension  Both;10 reps    Other Prone Exercises  heelsqueezes 10X5" holds             PT Education - 04/07/20 0847    Education Details  reviewed goals, HEP and POC moving forward.    Person(s) Educated  Patient    Methods  Explanation;Demonstration;Tactile cues;Verbal cues;Handout    Comprehension  Verbalized understanding;Returned demonstration;Verbal cues required;Tactile cues required;Need further instruction       PT Short Term Goals - 04/07/20 [id number]  PT SHORT TERM GOAL #1   Title  PT core and LE strength  to  increase to allow pt to be able to come from sit to stand with one UE assist    Time  3    Period  Weeks    Status  On-going    Target Date  04/22/20      PT SHORT TERM GOAL #2   Title  PT to be able to ambulate 120 ft with rolling walker in a 3 minute period of time to show progression of being a household ambulator.    Time  3    Period  Weeks    Status  On-going        PT Long Term Goals - 04/07/20 0855      PT LONG TERM GOAL #1   Title  Pt core and LE strength to increase to allow pt to be able to come sit to stand without UE assist    Time  6    Period  Weeks    Status  On-going      PT LONG TERM GOAL #2   Title  PT to be able to stand for 10 minutes with pain no greater than a 3/10 to be able to complete self grooming activities.    Time  6    Period  Weeks    Status  On-going      PT LONG TERM GOAL #3   Title  PT to be able to ambulate 226 ft with rolling walker in a 3' period time to be able to walk safely in her home.    Time  6    Period  Weeks    Status  On-going       PT LONG TERM GOAL #4   Title  PT to be able to single leg stance for 10 seconds to reduce risk of falling    Time  6    Period  Weeks    Status  On-going            Plan - 04/07/20 0857    Clinical Impression Statement  Reviewed goals and POC moving forward. Pt able to recall all exercises given for HEP and complete with minimal cues.  Added additional hip strenghtening exercises per POC and added prone hip extension/hamstring strengthening as well.  Pt unable to complete piriformis stretch without Passive assist from therapist due to extreme tightness and reduced general mobiltiy.  Pt without any pain voiced at end of session and without questions.    Personal Factors and Comorbidities  Age;Fitness;Time since onset of injury/illness/exacerbation    Examination-Activity Limitations  Stand;Stairs;Squat;Sit;Carry;Transfers;Locomotion Level    Examination-Participation Restrictions  Cleaning;Meal Prep;Other    Stability/Clinical Decision Making  Evolving/Moderate complexity    Rehab Potential  Good    PT Frequency  2x / week    PT Duration  6 weeks    PT Treatment/Interventions  DME Instruction;Gait training;Stair training;Functional mobility training;Therapeutic activities;Therapeutic exercise;Balance training;Patient/family education;Manual techniques    PT Next Visit Plan  Continue gait training urging pt to take longer steps while walking.  Continue with established therex and progress HEP as able.    PT Home Exercise Plan  given at evaluation:  LAQ, ankle pumps in sitting, Sit to stand       Patient will benefit from skilled therapeutic intervention in order to improve the following deficits and impairments:  Abnormal gait, Decreased activity tolerance, Decreased balance, Decreased mobility, Decreased range of  motion, Difficulty walking, Decreased strength, Pain  Visit Diagnosis: Other abnormalities of gait and mobility  Other symptoms and signs involving the musculoskeletal  system     Problem List Patient Active Problem List   Diagnosis Date Noted  . Gastroesophageal reflux disease without esophagitis 01/14/2020  . Osteoarthritis of right shoulder 01/14/2020  . Ear pain, bilateral 12/17/2019  . Osteoporosis 09/16/2019  . Controlled type 2 diabetes mellitus without complication, without long-term current use of insulin ([place]) 09/16/2019  . Hyperlipidemia 09/16/2019  . Problem with vaginal pessary ([place]) 09/03/2018  . Fitting and adjustment of pessary 03/10/2015  . Uterovaginal prolapse, complete 02/18/2015  . Complete uterovaginal prolapse 02/01/2015  . Female stress incontinence 06/10/2014  . Urge incontinence 06/10/2014   Teena Irani, PTA/CLT [phone]  Teena Irani 04/07/2020, 9:06 AM  [place], [place], [address] Phone: [phone]   Fax:  [phone]  Name: ILKA BRUCKS MRN: [id number] Date of Birth: 1933-06-14

## 2020-04-08 ENCOUNTER — Encounter (HOSPITAL_COMMUNITY): Payer: Self-pay | Admitting: Physical Therapy

## 2020-04-08 ENCOUNTER — Encounter (HOSPITAL_COMMUNITY): Payer: Self-pay

## 2020-04-08 ENCOUNTER — Ambulatory Visit (HOSPITAL_COMMUNITY): Payer: Medicare Other | Admitting: Physical Therapy

## 2020-04-08 ENCOUNTER — Ambulatory Visit (HOSPITAL_COMMUNITY): Payer: Medicare Other

## 2020-04-08 DIAGNOSIS — G8929 Other chronic pain: Secondary | ICD-10-CM | POA: Diagnosis not present

## 2020-04-08 DIAGNOSIS — M25511 Pain in right shoulder: Secondary | ICD-10-CM | POA: Diagnosis not present

## 2020-04-08 DIAGNOSIS — M6281 Muscle weakness (generalized): Secondary | ICD-10-CM | POA: Diagnosis not present

## 2020-04-08 DIAGNOSIS — M25612 Stiffness of left shoulder, not elsewhere classified: Secondary | ICD-10-CM

## 2020-04-08 DIAGNOSIS — M25512 Pain in left shoulder: Secondary | ICD-10-CM | POA: Diagnosis not present

## 2020-04-08 DIAGNOSIS — R2689 Other abnormalities of gait and mobility: Secondary | ICD-10-CM

## 2020-04-08 DIAGNOSIS — R29898 Other symptoms and signs involving the musculoskeletal system: Secondary | ICD-10-CM | POA: Diagnosis not present

## 2020-04-08 DIAGNOSIS — M25611 Stiffness of right shoulder, not elsewhere classified: Secondary | ICD-10-CM | POA: Diagnosis not present

## 2020-04-08 NOTE — Therapy (Signed)
[place], [place], [address] Phone: [phone]   Fax:  [phone]  Occupational Therapy Treatment  Patient Details  Name: Kimberly Best MRN: [id number] Date of Birth: 1933-11-16 Referring Provider (OT): Benny Lennert, MD   Encounter Date: 04/08/2020  OT End of Session - 04/08/20 0843    Visit Number  3    Number of Visits  8    Date for OT Re-Evaluation  04/29/20    Authorization Type  [employer] medicare    Authorization Time Period  No authorization. No visit limit $30 copay    Progress Note Due on Visit  10    OT Start Time  0815    OT Stop Time  0853    OT Time Calculation (min)  38 min    Activity Tolerance  Patient tolerated treatment well    Behavior During Therapy  [facility] for tasks assessed/performed       Past Medical History:  Diagnosis Date  . Allergy   . Anxiety   . Arthritis   . Cancer [facility])    right breast cancer  . Diabetes mellitus without complication ([place])   . GERD (gastroesophageal reflux disease)   . Hypertension   . Pessary maintenance 03/10/2015    Past Surgical History:  Procedure Laterality Date  . BREAST SURGERY Right    lumpectomy   . CHOLECYSTECTOMY    . EYE SURGERY      There were no vitals filed for this visit.  Subjective Assessment - 04/08/20 0834    Subjective   S: I didn't get to do alot of my exercises yesterday.    Currently in Pain?  Yes    Pain Score  3     Pain Location  Shoulder    Pain Orientation  Left;Right    Pain Descriptors / Indicators  Aching    Pain Type  Acute pain         [facility] OT Assessment - 04/08/20 0835      Assessment   Medical Diagnosis  Bilateral Shoulder Pain       Precautions   Precautions  None    Precaution Comments  very slow moving               OT Treatments/Exercises (OP) - 04/08/20 0836      Exercises   Exercises  Shoulder      Shoulder Exercises: Supine   Protraction  PROM;5 reps;AROM;10 reps    Horizontal  ABduction  PROM;5 reps;AROM;10 reps    External Rotation  PROM;5 reps;AROM;10 reps    Internal Rotation  PROM;5 reps;AROM;10 reps    Flexion  PROM;5 reps;AROM;10 reps    ABduction  PROM;5 reps      Shoulder Exercises: Seated   Protraction  AROM;10 reps    Horizontal ABduction  AROM;10 reps    External Rotation  AROM;10 reps    Internal Rotation  AROM;10 reps    Flexion  AROM;10 reps    Abduction  AROM;10 reps;Limitations    ABduction Limitations  shoulder level      Shoulder Exercises: ROM/Strengthening   Other ROM/Strengthening Exercises  seated. holding basketball. chest press, flexion, circles (right/left), overhead press; 12X      Manual Therapy   Manual Therapy  Myofascial release    Manual therapy comments  manual therapy completed seperately from all other interventions this date             OT Education -  04/08/20 0941    Education Details  seated A/ROM shoulder exercises. May stop AA/ROM    Person(s) Educated  Patient    Methods  Explanation;Demonstration;Verbal cues;Handout    Comprehension  Returned demonstration;Verbalized understanding       OT Short Term Goals - 04/07/20 1129      OT SHORT TERM GOAL #1   Title  Patient will be educated and independent with HEP in order to increase her BUE mobility and allow her to complete basic required tasks with less difficulty and pain.    Time  4    Period  Weeks    Status  On-going    Target Date  04/29/20      OT SHORT TERM GOAL #2   Title  Patient will increase her BUE shoulder A/ROM in order to reach behind her back and/or above shoulder level with less difficulty.    Time  4    Period  Weeks    Status  On-going      OT SHORT TERM GOAL #3   Title  Patient will increase shoulder stability while demonstrating or verbalizing her ability to complete overhead reach or sustained reaching tasks for approximately 1-2 minutes with no rest breaks.    Time  4    Period  Weeks    Status  On-going      OT SHORT  TERM GOAL #4   Title  Patient decrease fascial restrictions to trace amount in order to increase functional mobility needed to complete reaching and dressing tasks.    Time  4    Period  Weeks    Status  On-going      OT SHORT TERM GOAL #5   Title  Patient will report a decrease in pain in BUE or approximately 3/10 or less while completing daily tasks.    Time  4    Period  Weeks    Status  On-going               Plan - 04/08/20 0845    Clinical Impression Statement  A: Patient with less fascial restrictions noted this session compared to evaluation with left greater than right. Manual techniques completed to address fascial restrictions. Supine A/ROM was completed with patient demonstrating functional range. VC for form and technique were provided. HEP updated for A/ROM seated exercises.    Body Structure / Function / Physical Skills  ADL;ROM;Fascial restriction;Mobility;Strength;Pain;UE functional use    Plan  P: Functional reaching task with cones in cabinet. Follow up on HEP.    Consulted and Agree with Plan of Care  Patient       Patient will benefit from skilled therapeutic intervention in order to improve the following deficits and impairments:   Body Structure / Function / Physical Skills: ADL, ROM, Fascial restriction, Mobility, Strength, Pain, UE functional use       Visit Diagnosis: Stiffness of right shoulder, not elsewhere classified  Stiffness of left shoulder, not elsewhere classified  Chronic left shoulder pain  Chronic right shoulder pain    Problem List Patient Active Problem List   Diagnosis Date Noted  . Gastroesophageal reflux disease without esophagitis 01/14/2020  . Osteoarthritis of right shoulder 01/14/2020  . Ear pain, bilateral 12/17/2019  . Osteoporosis 09/16/2019  . Controlled type 2 diabetes mellitus without complication, without long-term current use of insulin ([place]) 09/16/2019  . Hyperlipidemia 09/16/2019  . Problem with vaginal  pessary ([place]) 09/03/2018  . Fitting and adjustment of pessary 03/10/2015  .  Uterovaginal prolapse, complete 02/18/2015  . Complete uterovaginal prolapse 02/01/2015  . Female stress incontinence 06/10/2014  . Urge incontinence 06/10/2014   Ailene Ravel, OTR/L,CBIS  [phone]  04/08/2020, 9:43 AM  [place] [address] [place], [place], [address] Phone: [phone]   Fax:  [phone]  Name: Kimberly Best MRN: [id number] Date of Birth: [birth year]

## 2020-04-08 NOTE — Therapy (Signed)
[place], [place], [address] Phone: [phone]   Fax:  [phone]  Physical Therapy Treatment  Patient Details  Name: Kimberly Best MRN: [id number] Date of Birth: [birth year] Referring Provider (PT): Benny Lennert   Encounter Date: 04/08/2020  PT End of Session - 04/08/20 0943    Visit Number  3    Number of Visits  12    Date for PT Re-Evaluation  05/13/20    Progress Note Due on Visit  10    PT Start Time  0915    PT Stop Time  0955    PT Time Calculation (min)  40 min    Equipment Utilized During Treatment  Gait belt    Activity Tolerance  Patient tolerated treatment well    Behavior During Therapy  [facility] for tasks assessed/performed       Past Medical History:  Diagnosis Date  . Allergy   . Anxiety   . Arthritis   . Cancer [facility])    right breast cancer  . Diabetes mellitus without complication ([place])   . GERD (gastroesophageal reflux disease)   . Hypertension   . Pessary maintenance 03/10/2015    Past Surgical History:  Procedure Laterality Date  . BREAST SURGERY Right    lumpectomy   . CHOLECYSTECTOMY    . EYE SURGERY      There were no vitals filed for this visit.  Subjective Assessment - 04/08/20 0916    Subjective  PT states that she is doing pretty good with her exercises at home.    Pertinent History  OA, DM,    Limitations  Standing;Walking;House hold activities;Lifting    How long can you sit comfortably?  No problem    How long can you stand comfortably?  She now has increased pain in her Lt hip almost immediately she has to put the wt on her right leg.    How long can you walk comfortably?  3 minutes with a rolling walker    Patient Stated Goals  avoid a hip surgery    Currently in Pain?  Yes    Pain Score  4     Pain Location  Hip    Pain Orientation  Left    Pain Descriptors / Indicators  Aching    Pain Type  Acute pain    Pain Onset  More than a month ago    Pain Frequency   Intermittent    Aggravating Factors   wt bearing    Pain Relieving Factors  rest                       [facility] Adult PT Treatment/Exercise - 04/08/20 0932      Ambulation/Gait   Gait Comments  with FWW urging to take larger steps      Exercises   Exercises  Knee/Hip      Knee/Hip Exercises: Stretches   Active Hamstring Stretch  Left;3 reps;20 seconds    Piriformis Stretch  Both;2 reps;30 seconds;Limitations    Other Knee/Hip Stretches  knee to chest 15" x 5 with towel ; LT       Knee/Hip Exercises: Standing   Heel Raises  10 reps    Functional Squat  10 reps    Other Standing Knee Exercises  side step at mat x 2 RT       Knee/Hip Exercises: Seated   Long Arc Quad  Both;10 reps  Long Arc Quad Weight  3 lbs.    Sit to Sand  10 reps;without UE support      Knee/Hip Exercises: Supine   Bridges  15 reps    Other Supine Knee/Hip Exercises  bent knee lift x 10       Knee/Hip Exercises: Sidelying   Hip ABduction  Both;15 reps    Clams  both 10 reps with 3" holds      Knee/Hip Exercises: Prone   Hamstring Curl  10 reps    Hip Extension  Both;10 reps    Other Prone Exercises  heelsqueezes 10X5" holds               PT Short Term Goals - 04/07/20 0855      PT SHORT TERM GOAL #1   Title  PT core and LE strength  to  increase to allow pt to be able to come from sit to stand with one UE assist    Time  3    Period  Weeks    Status  On-going    Target Date  04/22/20      PT SHORT TERM GOAL #2   Title  PT to be able to ambulate 120 ft with rolling walker in a 3 minute period of time to show progression of being a household ambulator.    Time  3    Period  Weeks    Status  On-going        PT Long Term Goals - 04/07/20 0855      PT LONG TERM GOAL #1   Title  Pt core and LE strength to increase to allow pt to be able to come sit to stand without UE assist    Time  6    Period  Weeks    Status  On-going      PT LONG TERM GOAL #2   Title  PT to  be able to stand for 10 minutes with pain no greater than a 3/10 to be able to complete self grooming activities.    Time  6    Period  Weeks    Status  On-going      PT LONG TERM GOAL #3   Title  PT to be able to ambulate 226 ft with rolling walker in a 3' period time to be able to walk safely in her home.    Time  6    Period  Weeks    Status  On-going      PT LONG TERM GOAL #4   Title  PT to be able to single leg stance for 10 seconds to reduce risk of falling    Time  6    Period  Weeks    Status  On-going            Plan - 04/08/20 [id number]    Clinical Impression Statement  PT ambulating with step through gait without cuing now.  Added standing exercises with good technique after verbal cuing.  PT is decreased but still remains in Lt hip.  PT continues to have decreased strength, decreased balance and pain she will continue to benefit from skilled PT    Personal Factors and Comorbidities  Age;Fitness;Time since onset of injury/illness/exacerbation    Examination-Activity Limitations  Stand;Stairs;Squat;Sit;Carry;Transfers;Locomotion Level    Examination-Participation Restrictions  Cleaning;Meal Prep;Other    Stability/Clinical Decision Making  Evolving/Moderate complexity    Rehab Potential  Good    PT Frequency  2x /  week    PT Duration  6 weeks    PT Treatment/Interventions  DME Instruction;Gait training;Stair training;Functional mobility training;Therapeutic activities;Therapeutic exercise;Balance training;Patient/family education;Manual techniques    PT Next Visit Plan  Continue gait training urging pt to take longer steps while walking.  Continue with established therex and progress HEP as able.    PT Home Exercise Plan  given at evaluation:  LAQ, ankle pumps in sitting, Sit to stand; 4/29:  bridge, sidelying abductiona and prone SLR       Patient will benefit from skilled therapeutic intervention in order to improve the following deficits and impairments:  Abnormal gait,  Decreased activity tolerance, Decreased balance, Decreased mobility, Decreased range of motion, Difficulty walking, Decreased strength, Pain  Visit Diagnosis: Other abnormalities of gait and mobility  Muscle weakness (generalized)     Problem List Patient Active Problem List   Diagnosis Date Noted  . Gastroesophageal reflux disease without esophagitis 01/14/2020  . Osteoarthritis of right shoulder 01/14/2020  . Ear pain, bilateral 12/17/2019  . Osteoporosis 09/16/2019  . Controlled type 2 diabetes mellitus without complication, without long-term current use of insulin ([place]) 09/16/2019  . Hyperlipidemia 09/16/2019  . Problem with vaginal pessary ([place]) 09/03/2018  . Fitting and adjustment of pessary 03/10/2015  . Uterovaginal prolapse, complete 02/18/2015  . Complete uterovaginal prolapse 02/01/2015  . Female stress incontinence 06/10/2014  . Urge incontinence 06/10/2014   Rayetta Humphrey, PT CLT [phone] 04/08/2020, 10:00 AM  [place], [place], [address] Phone: [phone]   Fax:  [phone]  Name: Kimberly Best MRN: [id number] Date of Birth: 1933-06-04

## 2020-04-08 NOTE — Patient Instructions (Signed)
Repeat all exercises 10-15 times, 1-2 times per day.  1) Shoulder Protraction    Begin with elbows by your side, slowly "punch" straight out in front of you.      2) Shoulder Flexion  Standing:         Begin with arms at your side with thumbs pointed up, slowly raise both arms up and forward towards overhead.          3) Horizontal abduction/adduction   Standing:           Begin with arms straight out in front of you, bring out to the side in at "T" shape. Keep arms straight entire time.        4) Internal & External Rotation   Standing:     Stand with elbows at the side and elbows bent 90 degrees. Move your forearms away from your body, then bring back inward toward the body.     5) Shoulder Abduction   Standing:       Begin with your arms flat on the table next to your side. Slowly move your arms out to the side so that they go overhead, in a jumping jack or snow angel movement.

## 2020-04-12 NOTE — Progress Notes (Signed)
Referring Provider: Maryruth Hancock, MD Primary Care Physician:  Maryruth Hancock, MD Primary Gastroenterologist:  Dr. Gala Romney  Chief Complaint  Patient presents with  . Anemia    black stools    HPI:   Kimberly Best is a 86 y.o. female presenting today at the request of Corum, Rex Kras, MD for anemia.  Recent labs completed with PCP 03/22/20: Hemoglobin 9.9 with MCHC 31.4 (L).  MCV and MCH within normal limits.  Iron panel with iron 36 (L), saturation 12% (L), ferritin 28.  B12 within normal limits. Kidney function within normal limits.  TSH within normal limits.  Upon chart review, appears patient has had chronic anemia with hemoglobin in the 9-10 range since September 2019.  Today: Just started iron about 1 month ago (after having labs completed). Stools are dark. States they are grayish, but not black. Started shortly before taking the iron. BMs daily. No constipation or diarrhea. No unintentional weight loss. Appetite is good. Eating well. From chart review, looks like she has lost about 6 lbs since 03/22/20.  This may be secondary to differences in scale calibrations. No bright red blood in the stool. Has some light pink discoloration on toilet tissue when urinating. Nothing bright red. States she has a pessary and it needs to be changed. These are normal symptoms prior to getting this changed.   No prior TCS or EGD.   Taking 2 Aleve a day for hip pain. Needs hip replacement. Just started this about 1 month. Prior to this, she wasn't taking any NSAIDs. Taking 3 baby aspirin a day per direction of PCP.   GERD symptoms a couple times a week. Triggered by "heavy foods" like potatoes/onions. Eats fried foods fairly regularly. Occasional soda. Has never been on anything daily for reflux. PCP recently started her on a medication as needed for this. Thinks it is pepcid OTC. Taking this 1-2 times a week.   Denies dysphagia. States if eating too fast or taking too big of a bite she can feel it going  down.   No N/V. No abdominal pain.   No fever, chills, cold or flu like symptoms. No lightheadedness, dizziness, pre-syncope or syncope. No chest pain or palpitations, shortness of breath or cough. No other obvious bleeding. No nose bleeds.   Past Medical History:  Diagnosis Date  . Allergy   . Anxiety   . Arthritis   . Cancer [facility])    right breast cancer  . Diabetes mellitus without complication ([place])   . GERD (gastroesophageal reflux disease)   . Hypertension   . Pessary maintenance 03/10/2015    Past Surgical History:  Procedure Laterality Date  . BREAST SURGERY Right    lumpectomy   . CHOLECYSTECTOMY    . EYE SURGERY      Current Outpatient Medications  Medication Sig Dispense Refill  . aspirin EC 81 MG tablet Take 81 mg by mouth daily.    . cholecalciferol (VITAMIN D) 1000 UNITS tablet Take 1,000 Units by mouth daily.    . Ferrous Sulfate 134 MG TABS Take 1 tablet (134 mg total) by mouth daily. 30 tablet 2  . lisinopril (ZESTRIL) 2.5 MG tablet Take 2.5 mg by mouth daily.    . metFORMIN (GLUCOPHAGE) 500 MG tablet Take one with largest meal 90 tablet 1  . pantoprazole (PROTONIX) 40 MG tablet Take 1 tablet (40 mg total) by mouth daily before breakfast. 30 tablet 5   No current facility-administered medications for this visit.  Allergies as of 04/14/2020 - Review Complete 04/14/2020  Allergen Reaction Noted  . Ace inhibitors  09/16/2019  . Penicillins Swelling 11/18/2014    Family History  Problem Relation Age of Onset  . Cancer Father   . Stroke Mother   . Hypertension Mother   . Diabetes Sister   . Hypertension Sister   . Cancer Brother   . Hypertension Brother   . Bipolar disorder Daughter   . Hypertension Maternal Grandmother   . Tuberculosis Paternal Grandmother   . Hypertension Paternal Grandmother   . Alcohol abuse Paternal Grandfather   . Hypertension Paternal Grandfather   . Colon cancer Neg Hx     Social History   Socioeconomic History  .  Marital status: Married    Spouse name: Not on file  . Number of children: Not on file  . Years of education: Not on file  . Highest education level: Not on file  Occupational History  . Not on file  Tobacco Use  . Smoking status: Never Smoker  . Smokeless tobacco: Never Used  Substance and Sexual Activity  . Alcohol use: No  . Drug use: No  . Sexual activity: Not Currently    Birth control/protection: Post-menopausal  Other Topics Concern  . Not on file  Social History Narrative  . Not on file   Social Determinants of Health   Financial Resource Strain:   . Difficulty of Paying Living Expenses:   Food Insecurity:   . Worried About Charity fundraiser in the Last Year:   . Arboriculturist in the Last Year:   Transportation Needs:   . Film/video editor (Medical):   Marland Kitchen Lack of Transportation (Non-Medical):   Physical Activity:   . Days of Exercise per Week:   . Minutes of Exercise per Session:   Stress:   . Feeling of Stress :   Social Connections:   . Frequency of Communication with Friends and Family:   . Frequency of Social Gatherings with Friends and Family:   . Attends Religious Services:   . Active Member of Clubs or Organizations:   . Attends Archivist Meetings:   Marland Kitchen Marital Status:   Intimate Partner Violence:   . Fear of Current or Ex-Partner:   . Emotionally Abused:   Marland Kitchen Physically Abused:   . Sexually Abused:     Review of Systems: Gen: See HPI CV: See HPI Resp: See HPI GI: See HPI  GU : Denies urinary burning, urinary frequency MS: Right hip pain. Derm: Denies rash Psych: Denies depression or anxiety Heme: See HPI  Physical Exam: BP (!) 154/74   Pulse 84   Temp (!) 96.9 F (36.1 C) (Temporal)   Ht 4\' 10"  (1.473 m)   Wt 166 lb 6.4 oz (75.5 kg)   BMI 34.78 kg/m  General:   Alert and oriented. Pleasant and cooperative. Well-nourished and well-developed.  Head:  Normocephalic and atraumatic. Eyes:  Without icterus, sclera clear  and conjunctiva pink.  Ears:  Normal auditory acuity. Lungs:  Clear to auscultation bilaterally. No wheezes, rales, or rhonchi. No distress.  Heart:  S1, S2 present without murmurs appreciated.  Abdomen:  +BS, soft, non-tender and non-distended. No HSM noted. No guarding or rebound. No masses appreciated.  Rectal:  Deferred  Msk:  Symmetrical without gross deformities. Normal posture. Extremities:  With minimal edema in the lower extremities. Neurologic:  Alert and  oriented x4;  grossly normal neurologically. Skin:  Intact without significant lesions  or rashes. Psych: Normal mood and affect.

## 2020-04-13 ENCOUNTER — Encounter (HOSPITAL_COMMUNITY): Payer: Medicare Other | Admitting: Physical Therapy

## 2020-04-13 ENCOUNTER — Other Ambulatory Visit: Payer: Self-pay

## 2020-04-13 ENCOUNTER — Ambulatory Visit (HOSPITAL_COMMUNITY): Payer: Medicare Other | Attending: Family Medicine | Admitting: Physical Therapy

## 2020-04-13 ENCOUNTER — Ambulatory Visit (HOSPITAL_COMMUNITY): Payer: Medicare Other

## 2020-04-13 ENCOUNTER — Encounter (HOSPITAL_COMMUNITY): Payer: Self-pay | Admitting: Physical Therapy

## 2020-04-13 ENCOUNTER — Encounter (HOSPITAL_COMMUNITY): Payer: Self-pay

## 2020-04-13 DIAGNOSIS — M25611 Stiffness of right shoulder, not elsewhere classified: Secondary | ICD-10-CM

## 2020-04-13 DIAGNOSIS — M25612 Stiffness of left shoulder, not elsewhere classified: Secondary | ICD-10-CM | POA: Insufficient documentation

## 2020-04-13 DIAGNOSIS — M25511 Pain in right shoulder: Secondary | ICD-10-CM | POA: Insufficient documentation

## 2020-04-13 DIAGNOSIS — M25512 Pain in left shoulder: Secondary | ICD-10-CM

## 2020-04-13 DIAGNOSIS — G8929 Other chronic pain: Secondary | ICD-10-CM | POA: Diagnosis not present

## 2020-04-13 DIAGNOSIS — R29898 Other symptoms and signs involving the musculoskeletal system: Secondary | ICD-10-CM | POA: Diagnosis not present

## 2020-04-13 DIAGNOSIS — R2689 Other abnormalities of gait and mobility: Secondary | ICD-10-CM | POA: Insufficient documentation

## 2020-04-13 DIAGNOSIS — M6281 Muscle weakness (generalized): Secondary | ICD-10-CM | POA: Diagnosis not present

## 2020-04-13 NOTE — Therapy (Signed)
[place], [place], [address] Phone: [phone]   Fax:  [phone]  Physical Therapy Treatment  Patient Details  Name: Kimberly Best MRN: [id number] Date of Birth: 1933/04/19 Referring Provider (PT): Benny Lennert   Encounter Date: 04/13/2020  PT End of Session - 04/13/20 0814    Visit Number  4    Number of Visits  12    Date for PT Re-Evaluation  05/13/20    Progress Note Due on Visit  10    PT Start Time  0815    PT Stop Time  0855    PT Time Calculation (min)  40 min    Equipment Utilized During Treatment  Gait belt    Activity Tolerance  Patient tolerated treatment well    Behavior During Therapy  [facility] for tasks assessed/performed       Past Medical History:  Diagnosis Date  . Allergy   . Anxiety   . Arthritis   . Cancer [facility])    right breast cancer  . Diabetes mellitus without complication ([place])   . GERD (gastroesophageal reflux disease)   . Hypertension   . Pessary maintenance 03/10/2015    Past Surgical History:  Procedure Laterality Date  . BREAST SURGERY Right    lumpectomy   . CHOLECYSTECTOMY    . EYE SURGERY      There were no vitals filed for this visit.  Subjective Assessment - 04/13/20 0821    Subjective  Patient says her LT hip is not doing so good today. Says she did some standing to fix dinner over the weekend and did a little sweeping. Says the exercises last time were good, no new issues otherwise.    Pertinent History  OA, DM,    Limitations  Standing;Walking;House hold activities;Lifting    How long can you sit comfortably?  No problem    How long can you stand comfortably?  She now has increased pain in her Lt hip almost immediately she has to put the wt on her right leg.    How long can you walk comfortably?  3 minutes with a rolling walker    Patient Stated Goals  avoid a hip surgery    Currently in Pain?  Yes    Pain Score  5     Pain Location  Hip    Pain Orientation   Left    Pain Descriptors / Indicators  Sharp;Shooting    Pain Type  Chronic pain    Pain Onset  More than a month ago    Aggravating Factors   standing, WB    Pain Relieving Factors  rest    Effect of Pain on Daily Activities  limits                       [facility] Adult PT Treatment/Exercise - 04/13/20 0001      Knee/Hip Exercises: Stretches   Active Hamstring Stretch  Left;5 reps;10 seconds    Active Hamstring Stretch Limitations  with towel     Piriformis Stretch  Both;2 reps;30 seconds;Limitations    Other Knee/Hip Stretches  knee to chest 15" x 5 with towel ; LT       Knee/Hip Exercises: Standing   Heel Raises  20 reps    Other Standing Knee Exercises  side step at mat x 3 RT       Knee/Hip Exercises: Seated   Sit to Sand  10  reps;without UE support      Knee/Hip Exercises: Supine   Bridges  15 reps    Other Supine Knee/Hip Exercises  bent knee lift x 15      Knee/Hip Exercises: Sidelying   Hip ABduction  Both;15 reps      Knee/Hip Exercises: Prone   Hamstring Curl  15 reps    Hip Extension  Both;15 reps               PT Short Term Goals - 04/07/20 0855      PT SHORT TERM GOAL #1   Title  PT core and LE strength  to  increase to allow pt to be able to come from sit to stand with one UE assist    Time  3    Period  Weeks    Status  On-going    Target Date  04/22/20      PT SHORT TERM GOAL #2   Title  PT to be able to ambulate 120 ft with rolling walker in a 3 minute period of time to show progression of being a household ambulator.    Time  3    Period  Weeks    Status  On-going        PT Long Term Goals - 04/07/20 0855      PT LONG TERM GOAL #1   Title  Pt core and LE strength to increase to allow pt to be able to come sit to stand without UE assist    Time  6    Period  Weeks    Status  On-going      PT LONG TERM GOAL #2   Title  PT to be able to stand for 10 minutes with pain no greater than a 3/10 to be able to complete self  grooming activities.    Time  6    Period  Weeks    Status  On-going      PT LONG TERM GOAL #3   Title  PT to be able to ambulate 226 ft with rolling walker in a 3' period time to be able to walk safely in her home.    Time  6    Period  Weeks    Status  On-going      PT LONG TERM GOAL #4   Title  PT to be able to single leg stance for 10 seconds to reduce risk of falling    Time  6    Period  Weeks    Status  On-going            Plan - 04/13/20 0855    Clinical Impression Statement  Patient tolerated session well today. Patient was able to progress reps with table exercise for hip and core strengthening as well as with standing exercise. Patient did require verbal and tactile cues for proper form and mechanics with sidelying hip abduction. Patient with noted difficulty with prone hip extensions due to weakness and ROM limitations, but with no reported pain. Patient will continue to benefit from skilled therapy services to progress hip strength and mobility to reduce pain and improve functional mobility.    Personal Factors and Comorbidities  Age;Fitness;Time since onset of injury/illness/exacerbation    Examination-Activity Limitations  Stand;Stairs;Squat;Sit;Carry;Transfers;Locomotion Level    Examination-Participation Restrictions  Cleaning;Meal Prep;Other    Stability/Clinical Decision Making  Evolving/Moderate complexity    Rehab Potential  Good    PT Frequency  2x / week  PT Duration  6 weeks    PT Treatment/Interventions  DME Instruction;Gait training;Stair training;Functional mobility training;Therapeutic activities;Therapeutic exercise;Balance training;Patient/family education;Manual techniques    PT Next Visit Plan  Continue gait training urging pt to take longer steps while walking. Continue with established therex and progress HEP as able.    PT Home Exercise Plan  given at evaluation:  LAQ, ankle pumps in sitting, Sit to stand; 4/29:  bridge, sidelying abductiona and  prone SLR       Patient will benefit from skilled therapeutic intervention in order to improve the following deficits and impairments:  Abnormal gait, Decreased activity tolerance, Decreased balance, Decreased mobility, Decreased range of motion, Difficulty walking, Decreased strength, Pain  Visit Diagnosis: Other abnormalities of gait and mobility  Muscle weakness (generalized)     Problem List Patient Active Problem List   Diagnosis Date Noted  . Gastroesophageal reflux disease without esophagitis 01/14/2020  . Osteoarthritis of right shoulder 01/14/2020  . Ear pain, bilateral 12/17/2019  . Osteoporosis 09/16/2019  . Controlled type 2 diabetes mellitus without complication, without long-term current use of insulin ([place]) 09/16/2019  . Hyperlipidemia 09/16/2019  . Problem with vaginal pessary ([place]) 09/03/2018  . Fitting and adjustment of pessary 03/10/2015  . Uterovaginal prolapse, complete 02/18/2015  . Complete uterovaginal prolapse 02/01/2015  . Female stress incontinence 06/10/2014  . Urge incontinence 06/10/2014    8:57 AM, 04/13/20 Josue Hector PT DPT  Physical Therapist with [facility]  [phone] [place] [address] [place], [place], [address] Phone: [phone]   Fax:  [phone]  Name: DANILYN MINAR MRN: [id number] Date of Birth: 05-27-1933

## 2020-04-13 NOTE — Therapy (Signed)
[place], [place], [address] Phone: [phone]   Fax:  [phone]  Occupational Therapy Treatment  Patient Details  Name: Kimberly Best MRN: [id number] Date of Birth: [birth year] Referring Provider (OT): Benny Lennert, MD   Encounter Date: 04/13/2020  OT End of Session - 04/13/20 0938    Visit Number  4    Number of Visits  8    Date for OT Re-Evaluation  04/29/20    Authorization Type  [employer] medicare    Authorization Time Period  No authorization. No visit limit $30 copay    Progress Note Due on Visit  10    OT Start Time  0900    OT Stop Time  0940    OT Time Calculation (min)  40 min    Activity Tolerance  Patient tolerated treatment well    Behavior During Therapy  WFL for tasks assessed/performed       Past Medical History:  Diagnosis Date  . Allergy   . Anxiety   . Arthritis   . Cancer [facility])    right breast cancer  . Diabetes mellitus without complication ([place])   . GERD (gastroesophageal reflux disease)   . Hypertension   . Pessary maintenance 03/10/2015    Past Surgical History:  Procedure Laterality Date  . BREAST SURGERY Right    lumpectomy   . CHOLECYSTECTOMY    . EYE SURGERY      There were no vitals filed for this visit.  Subjective Assessment - 04/13/20 0914    Subjective   S: I tried some of the exercises.    Currently in Pain?  Yes    Pain Score  3     Pain Location  Shoulder    Pain Orientation  Left;Right    Pain Descriptors / Indicators  Sore    Pain Type  Chronic pain    Pain Radiating Towards  n/a    Pain Onset  Today    Pain Frequency  Intermittent    Aggravating Factors   sleeping    Pain Relieving Factors  none stated    Effect of Pain on Daily Activities  no effect    Multiple Pain Sites  No         [facility] OT Assessment - 04/13/20 0915      Assessment   Medical Diagnosis  Bilateral Shoulder Pain       Precautions   Precautions  None    Precaution Comments   very slow moving               OT Treatments/Exercises (OP) - 04/13/20 0915      Exercises   Exercises  Shoulder      Shoulder Exercises: Supine   Protraction  PROM;5 reps;AROM;12 reps    Horizontal ABduction  PROM;5 reps;AROM;12 reps    External Rotation  PROM;5 reps;AROM;12 reps    Internal Rotation  PROM;5 reps;AROM;12 reps    Flexion  PROM;5 reps;AROM;12 reps    ABduction  PROM;5 reps;AROM;12 reps      Shoulder Exercises: Seated   Flexion  AROM;12 reps      Shoulder Exercises: ROM/Strengthening   UBE (Upper Arm Bike)  Level 1 2' reverse 2' forward    pace: 4.0-5.0   Other ROM/Strengthening Exercises  seated ball pass (green weighted ball) 5X right/left      Functional Reaching Activities   High Level  Patient placed then removed 10 cones from 1st  shelf of cabinet while attempting to reach the 2nd shelf each time for funcitional reaching task.               OT Short Term Goals - 04/07/20 1129      OT SHORT TERM GOAL #1   Title  Patient will be educated and independent with HEP in order to increase her BUE mobility and allow her to complete basic required tasks with less difficulty and pain.    Time  4    Period  Weeks    Status  On-going    Target Date  04/29/20      OT SHORT TERM GOAL #2   Title  Patient will increase her BUE shoulder A/ROM in order to reach behind her back and/or above shoulder level with less difficulty.    Time  4    Period  Weeks    Status  On-going      OT SHORT TERM GOAL #3   Title  Patient will increase shoulder stability while demonstrating or verbalizing her ability to complete overhead reach or sustained reaching tasks for approximately 1-2 minutes with no rest breaks.    Time  4    Period  Weeks    Status  On-going      OT SHORT TERM GOAL #4   Title  Patient decrease fascial restrictions to trace amount in order to increase functional mobility needed to complete reaching and dressing tasks.    Time  4    Period   Weeks    Status  On-going      OT SHORT TERM GOAL #5   Title  Patient will report a decrease in pain in BUE or approximately 3/10 or less while completing daily tasks.    Time  4    Period  Weeks    Status  On-going               Plan - 04/13/20 0945    Clinical Impression Statement  A: No manual therapy needed this session. Patient is demonstrating functional A/ROM during session. Added ball pass to work on internal rotation.VC for form and technique.    Body Structure / Function / Physical Skills  ADL;ROM;Fascial restriction;Mobility;Strength;Pain;UE functional use    Plan  P: Continue with A/ROM and functional reaching. Complete ball pass in armless chair for better range and less back rest restriction next session.    Consulted and Agree with Plan of Care  Patient       Patient will benefit from skilled therapeutic intervention in order to improve the following deficits and impairments:   Body Structure / Function / Physical Skills: ADL, ROM, Fascial restriction, Mobility, Strength, Pain, UE functional use       Visit Diagnosis: Chronic right shoulder pain  Chronic left shoulder pain  Other symptoms and signs involving the musculoskeletal system  Stiffness of left shoulder, not elsewhere classified  Stiffness of right shoulder, not elsewhere classified    Problem List Patient Active Problem List   Diagnosis Date Noted  . Gastroesophageal reflux disease without esophagitis 01/14/2020  . Osteoarthritis of right shoulder 01/14/2020  . Ear pain, bilateral 12/17/2019  . Osteoporosis 09/16/2019  . Controlled type 2 diabetes mellitus without complication, without long-term current use of insulin ([place]) 09/16/2019  . Hyperlipidemia 09/16/2019  . Problem with vaginal pessary ([place]) 09/03/2018  . Fitting and adjustment of pessary 03/10/2015  . Uterovaginal prolapse, complete 02/18/2015  . Complete uterovaginal prolapse 02/01/2015  . Female stress  incontinence  06/10/2014  . Urge incontinence 06/10/2014   Ailene Ravel, OTR/L,CBIS  [phone]  04/13/2020, 10:30 AM  [place], [place], [address] Phone: [phone]   Fax:  [phone]  Name: Kimberly Best MRN: [id number] Date of Birth: 1933-07-01

## 2020-04-14 ENCOUNTER — Ambulatory Visit: Payer: Medicare Other | Admitting: Gastroenterology

## 2020-04-14 ENCOUNTER — Encounter: Payer: Self-pay | Admitting: Gastroenterology

## 2020-04-14 VITALS — BP 154/74 | HR 84 | Temp 96.9°F | Ht <= 58 in | Wt 166.4 lb

## 2020-04-14 DIAGNOSIS — Z791 Long term (current) use of non-steroidal anti-inflammatories (NSAID): Secondary | ICD-10-CM

## 2020-04-14 DIAGNOSIS — D509 Iron deficiency anemia, unspecified: Secondary | ICD-10-CM

## 2020-04-14 DIAGNOSIS — Z4689 Encounter for fitting and adjustment of other specified devices: Secondary | ICD-10-CM | POA: Insufficient documentation

## 2020-04-14 DIAGNOSIS — D649 Anemia, unspecified: Secondary | ICD-10-CM | POA: Insufficient documentation

## 2020-04-14 DIAGNOSIS — K219 Gastro-esophageal reflux disease without esophagitis: Secondary | ICD-10-CM

## 2020-04-14 MED ORDER — PANTOPRAZOLE SODIUM 40 MG PO TBEC: 40.0000 mg | DELAYED_RELEASE_TABLET | Freq: Every day | ORAL | 5 refills | Status: DC

## 2020-04-14 NOTE — Assessment & Plan Note (Addendum)
Long-term NSAID use with patient taking 3 baby aspirin daily per the instruction of her primary care.  Also taking 2 Advil daily for hip pain.  Also recently diagnosed with iron deficiency anemia as discussed above.  Considering long-term NSAID use and anemia, will start her on Protonix 40 mg daily 30 minutes before breakfast.

## 2020-04-14 NOTE — Assessment & Plan Note (Addendum)
Mild intermittent GERD symptoms 1-2 times a week for which she is currently taking Pepcid as needed.  I would typically be okay with this regimen; however, she has been recently diagnosed with iron deficiency anemia with hemoglobin 9.9 in April 2021.  She has been taking 3 baby aspirin daily at the direction of her PCP as well as 2 Aleve a day for the last month for hip pain.   Due to chronic NSAID use and anemia, I will have her stop Pepcid and start Protonix 40 mg daily 30 minutes before breakfast. We also discussed a GERD diet.  Advised to avoid fried, fatty, greasy, spicy, citrus foods.  Avoid caffeine and carbonated beverages.  Do not eat within 3 hours of laying down.  Handout provided. Advised to limit Advil and avoid all NSAIDs as much as possible.  Use Tylenol for pain.  No more than 2 g daily. We will plan to follow-up after procedures which we are pursuing for newly diagnosed IDA as discussed below.

## 2020-04-14 NOTE — Assessment & Plan Note (Addendum)
86 year old female referred to [place] for iron deficiency anemia.  In April 2021, hemoglobin 9.9 with MCHC 31.4 (L).  Iron panel with  iron 36 (L), saturation 12% (L), ferritin 28.  B12 within normal limits. Kidney function within normal limits.  TSH within normal limits. Upon chart review, appears patient has had chronic anemia with hemoglobin in the 9-10 range since September 2019.  Started on OTC iron 1 month ago.  She reports stools are somewhat grayish in color but not black which started just before oral iron.  Denies bright red blood per rectum or other overt bleeding.  Occasional GERD symptoms 1-2 times a week currently taking Pepcid as needed.  No other significant upper GI symptoms.  She takes 3 baby aspirin daily at the direction of her PCP as well as 2 Aleve a day for hip pain.  No prior colonoscopy or EGD.  Denies lightheadedness, dizziness, chest pain, palpitations, or shortness of breath.  We discussed pursuing colonoscopy and EGD to evaluate iron deficiency anemia.  Patient is agreeable but states she would like to talk about this with her daughter prior to scheduling.  Considering chronic NSAID use, will go ahead and start her on daily PPI therapy.  Plan to pursue TCS + EGD in the near future with Dr. Gala Romney. The risks, benefits, and alternatives have been discussed in detail with patient. We will call her to schedule after she has had time to discuss with her daughter. Hold iron x 7 days prior to procedure.  See separate instructions for diabetes medication adjustments. Advised to limit Advil and avoid all NSAIDs as much as possible.  Use Tylenol for pain.  No more than 2 g daily. Stop Pepcid and start Protonix 40 mg daily 30 minutes before breakfast. Follow-up after procedures.

## 2020-04-14 NOTE — Patient Instructions (Addendum)
We will get you scheduled for a colonoscopy and upper endoscopy in the near future with Dr. Gala Romney. Hold iron for 7 days prior to your procedures. 1 day prior to procedure: One half dose of metformin Day of procedure: Do not take your morning diabetes medications.  Stop Pepcid and start Protonix 40 mg daily 30 minutes before your first meal.  This is to protect your stomach. I sent prescription to your pharmacy.  I recommend you stop Advil if possible and use Tylenol for your hip pain.  Do not take more than 2 g of Tylenol daily.  Try to avoid all NSAIDs if possible.  This includes ibuprofen, Aleve, Advil, and Goody powders.  These medications can cause inflammation your stomach and potentially cause you to bleed.  Be sure you are following a "GERD diet".  Avoiding fried, fatty, greasy, spicy, citrus foods.  Avoid caffeine and carbonated beverages.  Do not eat within 3 hours of going to bed.  See handout below.  We will follow up with you after your procedures.  Call with questions or concerns prior.  Should you develop profuse rectal bleeding, frequent black tarry stools, or feel lightheaded, dizzy or like you may pass out, you should proceed to the emergency room.   Aliene Altes, PA-C [facility]       Food Choices for Gastroesophageal Reflux Disease, Adult When you have gastroesophageal reflux disease (GERD), the foods you eat and your eating habits are very important. Choosing the right foods can help ease the discomfort of GERD. Consider working with a diet and nutrition specialist (dietitian) to help you make healthy food choices. What general guidelines should I follow?  Eating plan  Choose healthy foods low in fat, such as fruits, vegetables, whole grains, low-fat dairy products, and lean meat, fish, and poultry.  Eat frequent, small meals instead of three large meals each day. Eat your meals slowly, in a relaxed setting. Avoid bending over or lying down  until 2-3 hours after eating.  Limit high-fat foods such as fatty meats or fried foods.  Limit your intake of oils, butter, and shortening to less than 8 teaspoons each day.  Avoid the following: ? Foods that cause symptoms. These may be different for different people. Keep a food diary to keep track of foods that cause symptoms. ? Alcohol. ? Drinking large amounts of liquid with meals. ? Eating meals during the 2-3 hours before bed.  Cook foods using methods other than frying. This may include baking, grilling, or broiling. Lifestyle  Maintain a healthy weight. Ask your health care provider what weight is healthy for you. If you need to lose weight, work with your health care provider to do so safely.  Exercise for at least 30 minutes on 5 or more days each week, or as told by your health care provider.  Avoid wearing clothes that fit tightly around your waist and chest.  Do not use any products that contain nicotine or tobacco, such as cigarettes and e-cigarettes. If you need help quitting, ask your health care provider.  Sleep with the head of your bed raised. Use a wedge under the mattress or blocks under the bed frame to raise the head of the bed. What foods are not recommended? The items listed may not be a complete list. Talk with your dietitian about what dietary choices are best for you. Grains Pastries or quick breads with added fat. [place] toast. Vegetables Deep fried vegetables. [place] fries. Any vegetables prepared with added  fat. Any vegetables that cause symptoms. For some people this may include tomatoes and tomato products, chili peppers, onions and garlic, and horseradish. Fruits Any fruits prepared with added fat. Any fruits that cause symptoms. For some people this may include citrus fruits, such as oranges, grapefruit, pineapple, and lemons. Meats and other protein foods High-fat meats, such as fatty beef or pork, hot dogs, ribs, ham, sausage, salami and bacon.  Fried meat or protein, including fried fish and fried chicken. Nuts and nut butters. Dairy Whole milk and chocolate milk. Sour cream. Cream. Ice cream. Cream cheese. Milk shakes. Beverages Coffee and tea, with or without caffeine. Carbonated beverages. Sodas. Energy drinks. Fruit juice made with acidic fruits (such as orange or grapefruit). Tomato juice. Alcoholic drinks. Fats and oils Butter. Margarine. Shortening. Ghee. Sweets and desserts Chocolate and cocoa. Donuts. Seasoning and other foods Pepper. Peppermint and spearmint. Any condiments, herbs, or seasonings that cause symptoms. For some people, this may include curry, hot sauce, or vinegar-based salad dressings. Summary  When you have gastroesophageal reflux disease (GERD), food and lifestyle choices are very important to help ease the discomfort of GERD.  Eat frequent, small meals instead of three large meals each day. Eat your meals slowly, in a relaxed setting. Avoid bending over or lying down until 2-3 hours after eating.  Limit high-fat foods such as fatty meat or fried foods. This information is not intended to replace advice given to you by your health care provider. Make sure you discuss any questions you have with your health care provider. Document Revised: 03/20/2019 Document Reviewed: 11/28/2016 Elsevier Patient Education  [place].

## 2020-04-15 ENCOUNTER — Other Ambulatory Visit: Payer: Self-pay

## 2020-04-15 ENCOUNTER — Ambulatory Visit: Payer: Medicare Other | Admitting: Orthopaedic Surgery

## 2020-04-15 ENCOUNTER — Encounter (HOSPITAL_COMMUNITY): Payer: Self-pay | Admitting: Physical Therapy

## 2020-04-15 ENCOUNTER — Encounter: Payer: Self-pay | Admitting: Orthopaedic Surgery

## 2020-04-15 ENCOUNTER — Ambulatory Visit (HOSPITAL_COMMUNITY): Payer: Medicare Other | Admitting: Occupational Therapy

## 2020-04-15 ENCOUNTER — Ambulatory Visit (HOSPITAL_COMMUNITY): Payer: Medicare Other | Admitting: Specialist

## 2020-04-15 ENCOUNTER — Encounter (HOSPITAL_COMMUNITY): Payer: Self-pay | Admitting: Specialist

## 2020-04-15 ENCOUNTER — Ambulatory Visit (HOSPITAL_COMMUNITY): Payer: Medicare Other | Admitting: Physical Therapy

## 2020-04-15 VITALS — BP 144/84 | HR 84 | Temp 97.2°F | Ht <= 58 in | Wt 166.0 lb

## 2020-04-15 DIAGNOSIS — M25512 Pain in left shoulder: Secondary | ICD-10-CM | POA: Diagnosis not present

## 2020-04-15 DIAGNOSIS — M25611 Stiffness of right shoulder, not elsewhere classified: Secondary | ICD-10-CM | POA: Diagnosis not present

## 2020-04-15 DIAGNOSIS — R2689 Other abnormalities of gait and mobility: Secondary | ICD-10-CM

## 2020-04-15 DIAGNOSIS — R29898 Other symptoms and signs involving the musculoskeletal system: Secondary | ICD-10-CM | POA: Diagnosis not present

## 2020-04-15 DIAGNOSIS — M25511 Pain in right shoulder: Secondary | ICD-10-CM | POA: Diagnosis not present

## 2020-04-15 DIAGNOSIS — M25612 Stiffness of left shoulder, not elsewhere classified: Secondary | ICD-10-CM | POA: Diagnosis not present

## 2020-04-15 DIAGNOSIS — M25552 Pain in left hip: Secondary | ICD-10-CM | POA: Diagnosis not present

## 2020-04-15 DIAGNOSIS — G8929 Other chronic pain: Secondary | ICD-10-CM | POA: Diagnosis not present

## 2020-04-15 DIAGNOSIS — M6281 Muscle weakness (generalized): Secondary | ICD-10-CM | POA: Diagnosis not present

## 2020-04-15 DIAGNOSIS — M25551 Pain in right hip: Secondary | ICD-10-CM | POA: Diagnosis not present

## 2020-04-15 NOTE — Progress Notes (Signed)
I am better  Her hips are not hurting today.  She has been to PT and it has helped.  She could not tolerate the Aleve and stopped it.  Encounter Diagnosis  Name Primary?  . Hip pain, bilateral Yes   I will see as needed.  She will have a flare up sometime in the future.  She is a candidate for total hip.  Call if any problem.  Precautions discussed.   Electronically Signed Sanjuana Kava, MD 5/6/202110:26 AM

## 2020-04-15 NOTE — Therapy (Signed)
[place], [place], [address] Phone: [phone]   Fax:  [phone]  Physical Therapy Treatment  Patient Details  Name: Kimberly Best MRN: [id number] Date of Birth: 08-07-1933 Referring Provider (PT): Benny Lennert   Encounter Date: 04/15/2020  PT End of Session - 04/15/20 1128    Visit Number  5    Number of Visits  12    Date for PT Re-Evaluation  05/13/20    Progress Note Due on Visit  10    PT Start Time  1119    PT Stop Time  1157    PT Time Calculation (min)  38 min    Equipment Utilized During Treatment  Gait belt    Activity Tolerance  Patient tolerated treatment well    Behavior During Therapy  [facility] for tasks assessed/performed       Past Medical History:  Diagnosis Date  . Allergy   . Anxiety   . Arthritis   . Cancer [facility])    right breast cancer  . Diabetes mellitus without complication ([place])   . GERD (gastroesophageal reflux disease)   . Hypertension   . Pessary maintenance 03/10/2015    Past Surgical History:  Procedure Laterality Date  . BREAST SURGERY Right    lumpectomy   . CHOLECYSTECTOMY    . EYE SURGERY      There were no vitals filed for this visit.  Subjective Assessment - 04/15/20 1124    Subjective  Patient reported that her LT hip is feeling pretty good and that she has been doing the exercises at home.    Pertinent History  OA, DM,    Limitations  Standing;Walking;House hold activities;Lifting    How long can you sit comfortably?  No problem    How long can you stand comfortably?  She now has increased pain in her Lt hip almost immediately she has to put the wt on her right leg.    How long can you walk comfortably?  3 minutes with a rolling walker    Patient Stated Goals  avoid a hip surgery    Currently in Pain?  Yes    Pain Score  3     Pain Location  Hip    Pain Orientation  Left    Pain Descriptors / Indicators  Aching    Pain Type  Acute pain    Pain Onset  More  than a month ago                       [facility] Adult PT Treatment/Exercise - 04/15/20 0001      Ambulation/Gait   Ambulation/Gait  Yes    Ambulation Distance (Feet)  150 Feet    Assistive device  Straight cane    Gait Pattern  Step-through pattern;Shuffle    Gait Comments  Cues to decrease sound of steps and to take larger steps      Knee/Hip Exercises: Stretches   Active Hamstring Stretch  Left;5 reps;10 seconds    Active Hamstring Stretch Limitations  with towel     Other Knee/Hip Stretches  knee to chest 15" x 5 with towel ; LT       Knee/Hip Exercises: Standing   Heel Raises  20 reps    Knee Flexion  Strengthening;Right;Left;1 set;15 reps    Other Standing Knee Exercises  side step at mat x 3 RT       Knee/Hip Exercises: Supine  Bridges  15 reps    Other Supine Knee/Hip Exercises  bent knee lift x 15      Knee/Hip Exercises: Sidelying   Hip ABduction  Both;15 reps      Shoulder Exercises: Seated   Other Seated Exercises  1'' hurdle stepping over x10 each LE for hip flexion strengthening               PT Short Term Goals - 04/07/20 0855      PT SHORT TERM GOAL #1   Title  PT core and LE strength  to  increase to allow pt to be able to come from sit to stand with one UE assist    Time  3    Period  Weeks    Status  On-going    Target Date  04/22/20      PT SHORT TERM GOAL #2   Title  PT to be able to ambulate 120 ft with rolling walker in a 3 minute period of time to show progression of being a household ambulator.    Time  3    Period  Weeks    Status  On-going        PT Long Term Goals - 04/07/20 0855      PT LONG TERM GOAL #1   Title  Pt core and LE strength to increase to allow pt to be able to come sit to stand without UE assist    Time  6    Period  Weeks    Status  On-going      PT LONG TERM GOAL #2   Title  PT to be able to stand for 10 minutes with pain no greater than a 3/10 to be able to complete self grooming  activities.    Time  6    Period  Weeks    Status  On-going      PT LONG TERM GOAL #3   Title  PT to be able to ambulate 226 ft with rolling walker in a 3' period time to be able to walk safely in her home.    Time  6    Period  Weeks    Status  On-going      PT LONG TERM GOAL #4   Title  PT to be able to single leg stance for 10 seconds to reduce risk of falling    Time  6    Period  Weeks    Status  On-going            Plan - 04/15/20 1255    Clinical Impression Statement  Focused on lower extremity and hip strengthening as well as gait training this session. Encouraged patient with ambulation to take larger steps and to decrease the sound of her steps. Patient did demonstrate minimal improvement in gait mechanics following cueing. Patient did discuss an interest in having someone come out to her home to help her a couple days a week. Therapist plans to discuss with other therapists and have the next therapist relay any information regarding this.    Personal Factors and Comorbidities  Age;Fitness;Time since onset of injury/illness/exacerbation    Examination-Activity Limitations  Stand;Stairs;Squat;Sit;Carry;Transfers;Locomotion Level    Examination-Participation Restrictions  Cleaning;Meal Prep;Other    Stability/Clinical Decision Making  Evolving/Moderate complexity    Rehab Potential  Good    PT Frequency  2x / week    PT Duration  6 weeks    PT Treatment/Interventions  DME Instruction;Gait training;Stair  training;Functional mobility training;Therapeutic activities;Therapeutic exercise;Balance training;Patient/family education;Manual techniques    PT Next Visit Plan  Continue gait training urging pt to take longer steps while walking. Continue with established therex and progress HEP as able. Follow-up regarding patient requesting about information for home assistance a couple days a week - provide handout with a list of people or options if available.    PT Home Exercise  Plan  given at evaluation:  LAQ, ankle pumps in sitting, Sit to stand; 4/29:  bridge, sidelying abductiona and prone SLR       Patient will benefit from skilled therapeutic intervention in order to improve the following deficits and impairments:  Abnormal gait, Decreased activity tolerance, Decreased balance, Decreased mobility, Decreased range of motion, Difficulty walking, Decreased strength, Pain  Visit Diagnosis: Other abnormalities of gait and mobility     Problem List Patient Active Problem List   Diagnosis Date Noted  . Anemia 04/14/2020  . NSAID long-term use 04/14/2020  . GERD (gastroesophageal reflux disease) 01/14/2020  . Osteoarthritis of right shoulder 01/14/2020  . Ear pain, bilateral 12/17/2019  . Osteoporosis 09/16/2019  . Controlled type 2 diabetes mellitus without complication, without long-term current use of insulin ([place]) 09/16/2019  . Hyperlipidemia 09/16/2019  . Problem with vaginal pessary ([place]) 09/03/2018  . Fitting and adjustment of pessary 03/10/2015  . Uterovaginal prolapse, complete 02/18/2015  . Complete uterovaginal prolapse 02/01/2015  . Female stress incontinence 06/10/2014  . Urge incontinence 06/10/2014   Clarene Critchley PT, DPT 12:57 PM, 04/15/20 [place] [address] [place], [place], [address] Phone: [phone]   Fax:  [phone]  Name: Kimberly Best MRN: [id number] Date of Birth: [birth year]

## 2020-04-15 NOTE — Patient Instructions (Signed)
Your hip joint may eventually need to be replaced. The joint is worn out. Continue the exercises the physical therapist has told you to do. We will see you back if you need [place], give a call for appointment. Your pain may be worse with cold weather, or if you increase your activity

## 2020-04-15 NOTE — Therapy (Signed)
[facility] [place], [place], [address] Phone: [phone]   Fax:  [phone]  Occupational Therapy Treatment  Patient Details  Name: Kimberly Best MRN: [id number] Date of Birth: 1933-11-19 Referring Provider (OT): Benny Lennert, MD   Encounter Date: 04/15/2020  OT End of Session - 04/15/20 1620    Visit Number  5    Number of Visits  8    Date for OT Re-Evaluation  04/29/20    Authorization Type  [employer] medicare    Authorization Time Period  No authorization. No visit limit $30 copay    Progress Note Due on Visit  10    OT Start Time  1530    OT Stop Time  1610    OT Time Calculation (min)  40 min    Activity Tolerance  Patient tolerated treatment well    Behavior During Therapy  WFL for tasks assessed/performed       Past Medical History:  Diagnosis Date  . Allergy   . Anxiety   . Arthritis   . Cancer [facility])    right breast cancer  . Diabetes mellitus without complication ([place])   . GERD (gastroesophageal reflux disease)   . Hypertension   . Pessary maintenance 03/10/2015    Past Surgical History:  Procedure Laterality Date  . BREAST SURGERY Right    lumpectomy   . CHOLECYSTECTOMY    . EYE SURGERY      There were no vitals filed for this visit.  Subjective Assessment - 04/15/20 1620    Subjective   S:  I think I am doing a lot better    Currently in Pain?  Yes    Pain Score  3     Pain Location  Shoulder    Pain Orientation  Right;Left    Pain Descriptors / Indicators  Aching         [facility] OT Assessment - 04/15/20 0001      Assessment   Medical Diagnosis  Bilateral Shoulder Pain     Referring Provider (OT)  Benny Lennert, MD      Precautions   Precautions  None               OT Treatments/Exercises (OP) - 04/15/20 1626      Exercises   Exercises  Shoulder      Shoulder Exercises: Seated   Extension  Theraband;10 reps    Theraband Level (Shoulder Extension)  Level 2 (Red)    Retraction  Theraband;10 reps    Theraband Level (Shoulder Retraction)  Level 2 (Red)    Row  Theraband;10 reps    Theraband Level (Shoulder Row)  Level 2 (Red)    Protraction  Strengthening;Both;10 reps    Protraction Weight (lbs)  1    Horizontal ABduction  Strengthening;10 reps;Both    Horizontal ABduction Weight (lbs)  1    External Rotation  Strengthening;10 reps;Both    External Rotation Weight (lbs)  1    Internal Rotation  Strengthening;10 reps;Both    Internal Rotation Weight (lbs)  1    Flexion  Strengthening;10 reps;Both    Flexion Weight (lbs)  1    Abduction  Strengthening;10 reps;Both    ABduction Weight (lbs)  1      Shoulder Exercises: ROM/Strengthening   UBE (Upper Arm Bike)  Level 1 3' reverse 3' forward    pace 4.0 to 5.0   Proximal Shoulder Strengthening, Seated  10 times each with 1 #  resistance without rest breaks    Other ROM/Strengthening Exercises  seated holding volleyball completed chest press, overhead press, shoulder flexion, elbow flexion, overhead v 15 repetiitons each                OT Short Term Goals - 04/07/20 1129      OT SHORT TERM GOAL #1   Title  Patient will be educated and independent with HEP in order to increase her BUE mobility and allow her to complete basic required tasks with less difficulty and pain.    Time  4    Period  Weeks    Status  On-going    Target Date  04/29/20      OT SHORT TERM GOAL #2   Title  Patient will increase her BUE shoulder A/ROM in order to reach behind her back and/or above shoulder level with less difficulty.    Time  4    Period  Weeks    Status  On-going      OT SHORT TERM GOAL #3   Title  Patient will increase shoulder stability while demonstrating or verbalizing her ability to complete overhead reach or sustained reaching tasks for approximately 1-2 minutes with no rest breaks.    Time  4    Period  Weeks    Status  On-going      OT SHORT TERM GOAL #4   Title  Patient decrease fascial  restrictions to trace amount in order to increase functional mobility needed to complete reaching and dressing tasks.    Time  4    Period  Weeks    Status  On-going      OT SHORT TERM GOAL #5   Title  Patient will report a decrease in pain in BUE or approximately 3/10 or less while completing daily tasks.    Time  4    Period  Weeks    Status  On-going               Plan - 04/15/20 1621    Clinical Impression Statement  A:  Treatment focused on A/ROM, strengthening and sustained activity tolerance.  Paitent able to use 1# resistance with BUE exercises this date and maintain good form and technique.    Body Structure / Function / Physical Skills  ADL;ROM;Fascial restriction;Mobility;Strength;Pain;UE functional use    Plan  P:  Increase seated strengthening with 1# to 15 repetitions, complete simulated vacuuming task and add wrist weight with functiona reaching tasks.       Patient will benefit from skilled therapeutic intervention in order to improve the following deficits and impairments:   Body Structure / Function / Physical Skills: ADL, ROM, Fascial restriction, Mobility, Strength, Pain, UE functional use       Visit Diagnosis: Chronic left shoulder pain  Other symptoms and signs involving the musculoskeletal system  Stiffness of left shoulder, not elsewhere classified  Stiffness of right shoulder, not elsewhere classified  Chronic right shoulder pain    Problem List Patient Active Problem List   Diagnosis Date Noted  . Anemia 04/14/2020  . NSAID long-term use 04/14/2020  . GERD (gastroesophageal reflux disease) 01/14/2020  . Osteoarthritis of right shoulder 01/14/2020  . Ear pain, bilateral 12/17/2019  . Osteoporosis 09/16/2019  . Controlled type 2 diabetes mellitus without complication, without long-term current use of insulin ([place]) 09/16/2019  . Hyperlipidemia 09/16/2019  . Problem with vaginal pessary ([place]) 09/03/2018  . Fitting and adjustment of  pessary 03/10/2015  . Uterovaginal  prolapse, complete 02/18/2015  . Complete uterovaginal prolapse 02/01/2015  . Female stress incontinence 06/10/2014  . Urge incontinence 06/10/2014    Vangie Bicker, [place], OTR/L [id number]  04/15/2020, 4:29 PM  [facility] [address] [place], [place], [address] Phone: [phone]   Fax:  [phone]  Name: Kimberly Best MRN: [id number] Date of Birth: 13-Feb-1933

## 2020-04-19 ENCOUNTER — Telehealth: Payer: Self-pay | Admitting: Obstetrics and Gynecology

## 2020-04-19 NOTE — Telephone Encounter (Signed)

## 2020-04-20 ENCOUNTER — Other Ambulatory Visit: Payer: Self-pay

## 2020-04-20 ENCOUNTER — Ambulatory Visit (HOSPITAL_COMMUNITY): Payer: Medicare Other | Admitting: Physical Therapy

## 2020-04-20 ENCOUNTER — Telehealth: Payer: Self-pay | Admitting: Internal Medicine

## 2020-04-20 ENCOUNTER — Encounter (HOSPITAL_COMMUNITY): Payer: Self-pay | Admitting: Physical Therapy

## 2020-04-20 ENCOUNTER — Ambulatory Visit (HOSPITAL_COMMUNITY): Payer: Medicare Other

## 2020-04-20 ENCOUNTER — Ambulatory Visit: Payer: Medicare Other | Admitting: Obstetrics and Gynecology

## 2020-04-20 ENCOUNTER — Encounter (HOSPITAL_COMMUNITY): Payer: Medicare Other | Admitting: Physical Therapy

## 2020-04-20 ENCOUNTER — Encounter: Payer: Self-pay | Admitting: Obstetrics and Gynecology

## 2020-04-20 ENCOUNTER — Telehealth: Payer: Self-pay | Admitting: *Deleted

## 2020-04-20 ENCOUNTER — Encounter (HOSPITAL_COMMUNITY): Payer: Self-pay

## 2020-04-20 VITALS — BP 131/76 | HR 86 | Ht <= 58 in | Wt 164.4 lb

## 2020-04-20 DIAGNOSIS — R29898 Other symptoms and signs involving the musculoskeletal system: Secondary | ICD-10-CM

## 2020-04-20 DIAGNOSIS — Z466 Encounter for fitting and adjustment of urinary device: Secondary | ICD-10-CM

## 2020-04-20 DIAGNOSIS — M25512 Pain in left shoulder: Secondary | ICD-10-CM

## 2020-04-20 DIAGNOSIS — R2689 Other abnormalities of gait and mobility: Secondary | ICD-10-CM

## 2020-04-20 DIAGNOSIS — M25611 Stiffness of right shoulder, not elsewhere classified: Secondary | ICD-10-CM | POA: Diagnosis not present

## 2020-04-20 DIAGNOSIS — M25511 Pain in right shoulder: Secondary | ICD-10-CM | POA: Diagnosis not present

## 2020-04-20 DIAGNOSIS — M25612 Stiffness of left shoulder, not elsewhere classified: Secondary | ICD-10-CM | POA: Diagnosis not present

## 2020-04-20 DIAGNOSIS — Z4689 Encounter for fitting and adjustment of other specified devices: Secondary | ICD-10-CM

## 2020-04-20 DIAGNOSIS — M6281 Muscle weakness (generalized): Secondary | ICD-10-CM | POA: Diagnosis not present

## 2020-04-20 DIAGNOSIS — G8929 Other chronic pain: Secondary | ICD-10-CM

## 2020-04-20 NOTE — Progress Notes (Signed)
PATIENT ID: Kimberly Best, female     DOB: 11-Dec-1933, 86 y.o.     MRN: [id number]  GYNECOLOGY CLINIC PROGRESS NOTE  The patient presented today for a pessary check after 24 hour with pessary in place she reports that it does not help her pelvic discomfort. She reports no vaginal bleeding or discharge. She denies pelvic discomfort and difficulty urinating or moving her bowels. She has a history of diabetic.    ?The patient's  pessary was removed, cleaned and NOT  replaced without complications. Speculum examination revealed abnormal vaginal mucosa, with yeast present, with no lesions or lacerations. Vulvar irritation present.   The patient should return in  gning my name below, I, [employer], attest that this documentation has been prepared under the direction and in the presence of [name], MD. Electronically Signed: [place]. 04/20/20. 3:20 PM.  I personally performed the services described in this documentation, which was SCRIBED in my presence. The recorded information has been reviewed and considered accurate. It has been edited as necessary during review. [name], MD

## 2020-04-20 NOTE — Telephone Encounter (Signed)
Spoke to pt, TCS/EGD w/RMR scheduled for 05/26/20 at 8:30am. COVID test 05/24/20 at 9:30am. Orders entered. Appt letter mailed with procedure instructions.

## 2020-04-20 NOTE — Therapy (Signed)
[facility] [address] [place], [place], [address] Phone: [phone]   Fax:  [phone]  Physical Therapy Treatment  Patient Details  Name: Kimberly Best MRN: [id number] Date of Birth: 11-11-1933 Referring Provider (PT): Benny Lennert   Encounter Date: 04/20/2020  PT End of Session - 04/20/20 0827    Visit Number  6    Number of Visits  12    Date for PT Re-Evaluation  05/13/20    Progress Note Due on Visit  10    PT Start Time  0830   arrived late   PT Stop Time  0855    PT Time Calculation (min)  25 min    Equipment Utilized During Treatment  Gait belt    Activity Tolerance  Patient tolerated treatment well    Behavior During Therapy  [facility] for tasks assessed/performed       Past Medical History:  Diagnosis Date  . Allergy   . Anxiety   . Arthritis   . Cancer [facility])    right breast cancer  . Diabetes mellitus without complication ([place])   . GERD (gastroesophageal reflux disease)   . Hypertension   . Pessary maintenance 03/10/2015    Past Surgical History:  Procedure Laterality Date  . BREAST SURGERY Right    lumpectomy   . CHOLECYSTECTOMY    . EYE SURGERY      There were no vitals filed for this visit.  Subjective Assessment - 04/20/20 0832    Subjective  Patient says her back is a little rough today. Notes some soreness in LT hip today. Says she has been doing exercises at home daily.    Pertinent History  OA, DM,    Limitations  Standing;Walking;House hold activities;Lifting    How long can you sit comfortably?  No problem    How long can you stand comfortably?  She now has increased pain in her Lt hip almost immediately she has to put the wt on her right leg.    How long can you walk comfortably?  3 minutes with a rolling walker    Patient Stated Goals  avoid a hip surgery    Currently in Pain?  Yes    Pain Score  5     Pain Location  Hip    Pain Orientation  Left;Posterior    Pain Descriptors / Indicators   Aching    Pain Type  Acute pain    Pain Onset  More than a month ago                       [facility] Adult PT Treatment/Exercise - 04/20/20 0001      Knee/Hip Exercises: Stretches   Other Knee/Hip Stretches  knee to chest 10" x 5 with towel ; LT       Knee/Hip Exercises: Supine   Bridges  15 reps    Other Supine Knee/Hip Exercises  bent knee lift x 15    Other Supine Knee/Hip Exercises  LTR 5 x10" hold      Manual Therapy   Manual Therapy  Soft tissue mobilization    Manual therapy comments  manual therapy completed seperately from all other interventions this date    Soft tissue mobilization  IASTM using tennis ball to LT lateral/ posterior hip musculature to address pain and restriciton, patient in RT SL               PT Short Term  Goals - 04/07/20 0855      PT SHORT TERM GOAL #1   Title  PT core and LE strength  to  increase to allow pt to be able to come from sit to stand with one UE assist    Time  3    Period  Weeks    Status  On-going    Target Date  04/22/20      PT SHORT TERM GOAL #2   Title  PT to be able to ambulate 120 ft with rolling walker in a 3 minute period of time to show progression of being a household ambulator.    Time  3    Period  Weeks    Status  On-going        PT Long Term Goals - 04/07/20 0855      PT LONG TERM GOAL #1   Title  Pt core and LE strength to increase to allow pt to be able to come sit to stand without UE assist    Time  6    Period  Weeks    Status  On-going      PT LONG TERM GOAL #2   Title  PT to be able to stand for 10 minutes with pain no greater than a 3/10 to be able to complete self grooming activities.    Time  6    Period  Weeks    Status  On-going      PT LONG TERM GOAL #3   Title  PT to be able to ambulate 226 ft with rolling walker in a 3' period time to be able to walk safely in her home.    Time  6    Period  Weeks    Status  On-going      PT LONG TERM GOAL #4   Title  PT to be able  to single leg stance for 10 seconds to reduce risk of falling    Time  6    Period  Weeks    Status  On-going            Plan - 04/20/20 0857    Clinical Impression Statement  Patient with good tolerance to treatment overall. Patient arrived late to todays session, ther ex abbreviated accordingly. Focused session on restriction in Lt hip to address increased subjective complaint of pain today. Patient cued on hold times of LTR, and SKTC stretching. Patient will continue to benefit from skilled therapy services to reduce restriction in lumbar and LT hip to reduce pain and improve LOF with ADLs.    Personal Factors and Comorbidities  Age;Fitness;Time since onset of injury/illness/exacerbation    Examination-Activity Limitations  Stand;Stairs;Squat;Sit;Carry;Transfers;Locomotion Level    Examination-Participation Restrictions  Cleaning;Meal Prep;Other    Stability/Clinical Decision Making  Evolving/Moderate complexity    Rehab Potential  Good    PT Frequency  2x / week    PT Duration  6 weeks    PT Treatment/Interventions  DME Instruction;Gait training;Stair training;Functional mobility training;Therapeutic activities;Therapeutic exercise;Balance training;Patient/family education;Manual techniques    PT Next Visit Plan  Continue gait training urging pt to take longer steps while walking. Continue with established therex and progress HEP as able. Follow-up regarding patient requesting about information for home assistance a couple days a week - provide handout with a list of people or options if available.    PT Home Exercise Plan  given at evaluation:  LAQ, ankle pumps in sitting, Sit to stand; 4/29:  bridge,  sidelying abductiona and prone SLR    Consulted and Agree with Plan of Care  Patient       Patient will benefit from skilled therapeutic intervention in order to improve the following deficits and impairments:  Abnormal gait, Decreased activity tolerance, Decreased balance, Decreased  mobility, Decreased range of motion, Difficulty walking, Decreased strength, Pain  Visit Diagnosis: Other abnormalities of gait and mobility     Problem List Patient Active Problem List   Diagnosis Date Noted  . Anemia 04/14/2020  . NSAID long-term use 04/14/2020  . GERD (gastroesophageal reflux disease) 01/14/2020  . Osteoarthritis of right shoulder 01/14/2020  . Ear pain, bilateral 12/17/2019  . Osteoporosis 09/16/2019  . Controlled type 2 diabetes mellitus without complication, without long-term current use of insulin ([place]) 09/16/2019  . Hyperlipidemia 09/16/2019  . Problem with vaginal pessary ([place]) 09/03/2018  . Fitting and adjustment of pessary 03/10/2015  . Uterovaginal prolapse, complete 02/18/2015  . Complete uterovaginal prolapse 02/01/2015  . Female stress incontinence 06/10/2014  . Urge incontinence 06/10/2014    8:58 AM, 04/20/20 Josue Hector PT DPT  Physical Therapist with [facility]  [phone] [facility] [address] [place], [place], [address] Phone: [phone]   Fax:  [phone]  Name: FRANTASIA COULIBALY MRN: [id number] Date of Birth: 1933-11-28

## 2020-04-20 NOTE — Patient Instructions (Addendum)
Please take pessary home and soak in vinegar water to clean. Bring back on return to clinic for reinsertion.

## 2020-04-20 NOTE — Telephone Encounter (Signed)
Patient returned call, please call back, she was in a doctors office and could not answer

## 2020-04-20 NOTE — Therapy (Signed)
[facility] [place], [place], [address] Phone: [phone]   Fax:  [phone]  Occupational Therapy Treatment  Patient Details  Name: Kimberly Best MRN: [id number] Date of Birth: April 10, 1933 Referring Provider (OT): Benny Lennert, MD   Encounter Date: 04/20/2020  OT End of Session - 04/20/20 1242    Visit Number  6    Number of Visits  9    Date for OT Re-Evaluation  04/29/20    Authorization Type  [employer] medicare    Authorization Time Period  No authorization. No visit limit $30 copay    Progress Note Due on Visit  10    OT Start Time  0902    OT Stop Time  0940    OT Time Calculation (min)  38 min    Activity Tolerance  Patient tolerated treatment well    Behavior During Therapy  WFL for tasks assessed/performed       Past Medical History:  Diagnosis Date  . Allergy   . Anxiety   . Arthritis   . Cancer [facility])    right breast cancer  . Diabetes mellitus without complication ([place])   . GERD (gastroesophageal reflux disease)   . Hypertension   . Pessary maintenance 03/10/2015    Past Surgical History:  Procedure Laterality Date  . BREAST SURGERY Right    lumpectomy   . CHOLECYSTECTOMY    . EYE SURGERY      There were no vitals filed for this visit.  Subjective Assessment - 04/20/20 1236    Subjective   "My hips have been hurting a bit."    Currently in Pain?  Yes    Pain Score  3     Pain Location  Hip    Pain Orientation  Left    Pain Type  Chronic pain         [facility] OT Assessment - 04/20/20 0944      Assessment   Medical Diagnosis  Bilateral Shoulder Pain       Precautions   Precautions  None               OT Treatments/Exercises (OP) - 04/20/20 0911      ADLs   Home Maintenance  Simulating vacuuming task with both light weight and heavy vacuum. 10 reps each. RUE      Exercises   Exercises  Shoulder      Shoulder Exercises: Seated   Protraction  AROM;Both;10 reps;Strengthening;15  reps    Protraction Weight (lbs)  1    Horizontal ABduction  Strengthening;Both;15 reps;Weights    Horizontal ABduction Weight (lbs)  1    External Rotation  Strengthening;Both;15 reps    External Rotation Weight (lbs)  1    Internal Rotation  Strengthening;15 reps;Both    Internal Rotation Weight (lbs)  1    Flexion  AROM;Both;10 reps;Strengthening;15 reps    Flexion Weight (lbs)  1    Abduction  Strengthening;Both;15 reps;Weights;Limitations    ABduction Weight (lbs)  1    ABduction Limitations  Shoulder level      Shoulder Exercises: ROM/Strengthening   Over Head Lace  2' each UE; start with RUE lacing; Resistance wrist weights 1#    Proximal Shoulder Strengthening, Seated  10 times each with 1 # resistance with rest breaks between each movement; 15 reps both directions without weight; Difficulty isolating shoulder with tendency for elbow flexion    Other ROM/Strengthening Exercises  seated holding volleyball completed chest  press, overhead press; 15 reps      Functional Reaching Activities   High Level  Resistence clothes pins; seated; 1# weight; start with RUE to place pins on pole and take off with LUE.                OT Short Term Goals - 04/07/20 1129      OT SHORT TERM GOAL #1   Title  Patient will be educated and independent with HEP in order to increase her BUE mobility and allow her to complete basic required tasks with less difficulty and pain.    Time  4    Period  Weeks    Status  On-going    Target Date  04/29/20      OT SHORT TERM GOAL #2   Title  Patient will increase her BUE shoulder A/ROM in order to reach behind her back and/or above shoulder level with less difficulty.    Time  4    Period  Weeks    Status  On-going      OT SHORT TERM GOAL #3   Title  Patient will increase shoulder stability while demonstrating or verbalizing her ability to complete overhead reach or sustained reaching tasks for approximately 1-2 minutes with no rest breaks.     Time  4    Period  Weeks    Status  On-going      OT SHORT TERM GOAL #4   Title  Patient decrease fascial restrictions to trace amount in order to increase functional mobility needed to complete reaching and dressing tasks.    Time  4    Period  Weeks    Status  On-going      OT SHORT TERM GOAL #5   Title  Patient will report a decrease in pain in BUE or approximately 3/10 or less while completing daily tasks.    Time  4    Period  Weeks    Status  On-going               Plan - 04/20/20 1243    Clinical Impression Statement  A: Focused treatment on A/ROM, strengthing, and increasing activity tolerance. Patient performing A/ROM with and without 1# resistance at [place]; 15 reps. Challenging functional reach through clothes pins task and overhead lacing. Providing VCs for normalized movement patterns. Discussing home management and pt reporting improvements with activity tolerance during vacuuming task; also educating on energy conservation and pt verbalizing incorporation of rest breaks duirng vacuuming.    Body Structure / Function / Physical Skills  ADL;ROM;Fascial restriction;Mobility;Strength;Pain;UE functional use    Plan  P: Increase scapular and shoulder stability during ROM and funcitonal reaching. Progress as patient is able to tolerate.    Consulted and Agree with Plan of Care  Patient       Patient will benefit from skilled therapeutic intervention in order to improve the following deficits and impairments:   Body Structure / Function / Physical Skills: ADL, ROM, Fascial restriction, Mobility, Strength, Pain, UE functional use       Visit Diagnosis: Chronic left shoulder pain  Other symptoms and signs involving the musculoskeletal system  Stiffness of left shoulder, not elsewhere classified  Stiffness of right shoulder, not elsewhere classified  Chronic right shoulder pain    Problem List Patient Active Problem List   Diagnosis Date Noted  . Anemia  04/14/2020  . NSAID long-term use 04/14/2020  . GERD (gastroesophageal reflux disease) 01/14/2020  . Osteoarthritis of  right shoulder 01/14/2020  . Ear pain, bilateral 12/17/2019  . Osteoporosis 09/16/2019  . Controlled type 2 diabetes mellitus without complication, without long-term current use of insulin ([place]) 09/16/2019  . Hyperlipidemia 09/16/2019  . Problem with vaginal pessary ([place]) 09/03/2018  . Fitting and adjustment of pessary 03/10/2015  . Uterovaginal prolapse, complete 02/18/2015  . Complete uterovaginal prolapse 02/01/2015  . Female stress incontinence 06/10/2014  . Urge incontinence 06/10/2014    [place], OTR/L Outpatient Rehab 04/20/2020, 2:39 PM  [facility] [place], [place], [address] Phone: [phone]   Fax:  [phone]  Name: JERIKA BERENDSEN MRN: [id number] Date of Birth: 1933-01-09

## 2020-04-20 NOTE — Telephone Encounter (Signed)
Called pt, no answer and VM not set up  She needs to be scheduled for TCS/EGD with RMR-IDA per recent OV.

## 2020-04-20 NOTE — Telephone Encounter (Signed)
See other phone note for today. 

## 2020-04-21 NOTE — Telephone Encounter (Signed)
Unable to submit PA for EGD via [facility] website (PA not needed for TCS). Called [employer], spoke to [place]. Initiated PA. Ref# [id number].

## 2020-04-22 ENCOUNTER — Other Ambulatory Visit: Payer: Self-pay

## 2020-04-22 ENCOUNTER — Ambulatory Visit (HOSPITAL_COMMUNITY): Payer: Medicare Other | Admitting: Physical Therapy

## 2020-04-22 ENCOUNTER — Ambulatory Visit (HOSPITAL_COMMUNITY): Payer: Medicare Other

## 2020-04-22 ENCOUNTER — Encounter (HOSPITAL_COMMUNITY): Payer: Self-pay

## 2020-04-22 DIAGNOSIS — M25512 Pain in left shoulder: Secondary | ICD-10-CM

## 2020-04-22 DIAGNOSIS — G8929 Other chronic pain: Secondary | ICD-10-CM

## 2020-04-22 DIAGNOSIS — M25611 Stiffness of right shoulder, not elsewhere classified: Secondary | ICD-10-CM | POA: Diagnosis not present

## 2020-04-22 DIAGNOSIS — R29898 Other symptoms and signs involving the musculoskeletal system: Secondary | ICD-10-CM | POA: Diagnosis not present

## 2020-04-22 DIAGNOSIS — M6281 Muscle weakness (generalized): Secondary | ICD-10-CM

## 2020-04-22 DIAGNOSIS — M25511 Pain in right shoulder: Secondary | ICD-10-CM | POA: Diagnosis not present

## 2020-04-22 DIAGNOSIS — M25612 Stiffness of left shoulder, not elsewhere classified: Secondary | ICD-10-CM | POA: Diagnosis not present

## 2020-04-22 DIAGNOSIS — R2689 Other abnormalities of gait and mobility: Secondary | ICD-10-CM

## 2020-04-22 NOTE — Patient Instructions (Signed)
Getting Into / Out of Bed    Lower self to lie down on one side by raising legs and lowering head at the same time. Use arms to assist moving without twisting. Bend both knees to roll onto back if desired. To sit up, start from lying on side, and use same move-ments in reverse. Keep trunk aligned with legs.   Copyright  VHI. All rights reserved.

## 2020-04-22 NOTE — Therapy (Signed)
[place], [place], [address] Phone: [phone]   Fax:  [phone]  Physical Therapy Treatment  Patient Details  Name: Kimberly Best MRN: [id number] Date of Birth: 02-Dec-1933 Referring Provider (PT): Benny Lennert   Encounter Date: 04/22/2020  PT End of Session - 04/22/20 0942    Visit Number  7    Number of Visits  12    Date for PT Re-Evaluation  05/13/20    Authorization Type  [employer]    Progress Note Due on Visit  10    PT Start Time  0915    PT Stop Time  1000    PT Time Calculation (min)  45 min    Equipment Utilized During Treatment  Gait belt    Activity Tolerance  Patient tolerated treatment well    Behavior During Therapy  [facility] for tasks assessed/performed       Past Medical History:  Diagnosis Date  . Allergy   . Anxiety   . Arthritis   . Cancer [facility])    right breast cancer  . Diabetes mellitus without complication ([place])   . GERD (gastroesophageal reflux disease)   . Hypertension   . Pessary maintenance 03/10/2015    Past Surgical History:  Procedure Laterality Date  . BREAST SURGERY Right    lumpectomy   . CHOLECYSTECTOMY    . EYE SURGERY      There were no vitals filed for this visit.  Subjective Assessment - 04/22/20 0935    Subjective  PT continues to have back and hip pain    Pertinent History  OA, DM,    Limitations  Standing;Walking;House hold activities;Lifting    How long can you sit comfortably?  No problem    How long can you stand comfortably?  She now has increased pain in her Lt hip almost immediately she has to put the wt on her right leg.    How long can you walk comfortably?  3 minutes with a rolling walker    Patient Stated Goals  avoid a hip surgery    Currently in Pain?  Yes    Pain Score  5     Pain Location  Back    Pain Orientation  Lower    Pain Descriptors / Indicators  Aching    Pain Type  Chronic pain    Pain Onset  More than a month ago     Pain Frequency  Intermittent                        [facility] Adult PT Treatment/Exercise - 04/22/20 0936      Ambulation/Gait   Ambulation/Gait  Yes    Ambulation Distance (Feet)  226 Feet    Assistive device  Straight cane    Gait Pattern  Step-through pattern;Shuffle    Gait Comments  cues to increase stride but decrease speed       Exercises   Exercises  Lumbar;Knee/Hip      Lumbar Exercises: Stretches   Active Hamstring Stretch  Right;Left;3 reps;30 seconds    Single Knee to Chest Stretch  Right;Left;3 reps;20 seconds    Lower Trunk Rotation  2 reps;20 seconds    Piriformis Stretch  Right;Left;2 reps;30 seconds      Lumbar Exercises: Standing   Heel Raises  10 reps    Functional Squats  10 reps    Other Standing Lumbar Exercises  side step  out pushing with leg to return x 10 B       Lumbar Exercises: Supine   Ab Set  10 reps    Dead Bug  10 reps    Bridge  10 reps             PT Education - 04/22/20 [phone]    Education Details  updated HEP, bed mobity    Person(s) Educated  Patient    Methods  Explanation;Demonstration;Verbal cues;Handout;Tactile cues       PT Short Term Goals - 04/22/20 0955      PT SHORT TERM GOAL #1   Title  PT core and LE strength  to  increase to allow pt to be able to come from sit to stand with one UE assist    Time  3    Period  Weeks    Status  Achieved    Target Date  04/22/20      PT SHORT TERM GOAL #2   Title  PT to be able to ambulate 120 ft with rolling walker in a 3 minute period of time to show progression of being a household ambulator.    Time  3    Period  Weeks    Status  Achieved        PT Long Term Goals - 04/22/20 0955      PT LONG TERM GOAL #1   Title  Pt core and LE strength to increase to allow pt to be able to come sit to stand without UE assist    Time  6    Period  Weeks    Status  Achieved      PT LONG TERM GOAL #2   Title  PT to be able to stand for 10 minutes with pain no  greater than a 3/10 to be able to complete self grooming activities.    Time  6    Period  Weeks    Status  Achieved      PT LONG TERM GOAL #3   Title  PT to be able to ambulate 226 ft with rolling walker in a 3' period time to be able to walk safely in her home.    Time  6    Period  Weeks    Status  Achieved      PT LONG TERM GOAL #4   Title  PT to be able to single leg stance for 10 seconds to reduce risk of falling    Time  6    Period  Weeks    Status  On-going      PT LONG TERM GOAL #5   Title  Pt core and balance to be increased to allow pt to be able to vacuum by herself    Time  3    Period  Weeks    Status  New    Target Date  05/13/20            Plan - 04/22/20 0949    Clinical Impression Statement  Therapist adjusted pt cant to her height.  Gt trained working on longer steps and picking up pt feet.  Added HEP and new exercise to increase strength.    Personal Factors and Comorbidities  Age;Fitness;Time since onset of injury/illness/exacerbation    Examination-Activity Limitations  Stand;Stairs;Squat;Sit;Carry;Transfers;Locomotion Level    Examination-Participation Restrictions  Cleaning;Meal Prep;Other    Stability/Clinical Decision Making  Evolving/Moderate complexity    Rehab Potential  Good  PT Frequency  2x / week    PT Duration  6 weeks    PT Treatment/Interventions  DME Instruction;Gait training;Stair training;Functional mobility training;Therapeutic activities;Therapeutic exercise;Balance training;Patient/family education;Manual techniques    PT Next Visit Plan  Continue gait training urging pt to take longer steps while walking. Continue with established therex and progress HEP as able. Follow-up regarding patient requesting about information for home assistance a couple days a week - provide handout with a list of people or options if available.    PT Home Exercise Plan  given at evaluation:  LAQ, ankle pumps in sitting, Sit to stand; 4/29:  bridge,  sidelying abductiona and prone SLR; 5/13:  heel raises; functional squats, bed mobility and sit to stand    Consulted and Agree with Plan of Care  Patient       Patient will benefit from skilled therapeutic intervention in order to improve the following deficits and impairments:  Abnormal gait, Decreased activity tolerance, Decreased balance, Decreased mobility, Decreased range of motion, Difficulty walking, Decreased strength, Pain  Visit Diagnosis: Other abnormalities of gait and mobility  Muscle weakness (generalized)     Problem List Patient Active Problem List   Diagnosis Date Noted  . Anemia 04/14/2020  . NSAID long-term use 04/14/2020  . GERD (gastroesophageal reflux disease) 01/14/2020  . Osteoarthritis of right shoulder 01/14/2020  . Ear pain, bilateral 12/17/2019  . Osteoporosis 09/16/2019  . Controlled type 2 diabetes mellitus without complication, without long-term current use of insulin ([place]) 09/16/2019  . Hyperlipidemia 09/16/2019  . Problem with vaginal pessary ([place]) 09/03/2018  . Fitting and adjustment of pessary 03/10/2015  . Uterovaginal prolapse, complete 02/18/2015  . Complete uterovaginal prolapse 02/01/2015  . Female stress incontinence 06/10/2014  . Urge incontinence 06/10/2014   Rayetta Humphrey, PT CLT [phone] 04/22/2020, 9:58 AM  [place] [address] [place], [place], [address] Phone: [phone]   Fax:  [phone]  Name: Kimberly Best MRN: [id number] Date of Birth: 26-Dec-1932

## 2020-04-22 NOTE — Telephone Encounter (Signed)
EGD approved. PA# [id number], valid 05/26/20-08/24/20.

## 2020-04-22 NOTE — Therapy (Signed)
[place], [place], [address] Phone: [phone]   Fax:  [phone]  Occupational Therapy Treatment  Patient Details  Name: Kimberly Best MRN: [id number] Date of Birth: 03-Jun-1933 Referring Provider (OT): Benny Lennert, MD   Encounter Date: 04/22/2020  OT End of Session - 04/22/20 0905    Visit Number  7    Number of Visits  9    Date for OT Re-Evaluation  04/29/20    Authorization Type  [employer] medicare    Authorization Time Period  No authorization. No visit limit $30 copay    Progress Note Due on Visit  10    OT Start Time  0815    OT Stop Time  0853    OT Time Calculation (min)  38 min    Activity Tolerance  Patient tolerated treatment well    Behavior During Therapy  [facility] for tasks assessed/performed       Past Medical History:  Diagnosis Date  . Allergy   . Anxiety   . Arthritis   . Cancer [facility])    right breast cancer  . Diabetes mellitus without complication ([place])   . GERD (gastroesophageal reflux disease)   . Hypertension   . Pessary maintenance 03/10/2015    Past Surgical History:  Procedure Laterality Date  . BREAST SURGERY Right    lumpectomy   . CHOLECYSTECTOMY    . EYE SURGERY      There were no vitals filed for this visit.  Subjective Assessment - 04/22/20 0819    Subjective   S: My hip feels a little stiff but I'm doing good.    Currently in Pain?  No/denies         [facility] OT Assessment - 04/22/20 0819      Assessment   Medical Diagnosis  Bilateral Shoulder Pain       Precautions   Precautions  None               OT Treatments/Exercises (OP) - 04/22/20 0819      Exercises   Exercises  Shoulder      Shoulder Exercises: Seated   Extension  Theraband;15 reps    Theraband Level (Shoulder Extension)  Level 2 (Red)    Retraction  Theraband;15 reps    Theraband Level (Shoulder Retraction)  Level 2 (Red)    Protraction  Strengthening;15 reps    Protraction Weight  (lbs)  1    Horizontal ABduction  Strengthening;15 reps    Horizontal ABduction Weight (lbs)  1    External Rotation  Strengthening;10 reps    External Rotation Weight (lbs)  1    Internal Rotation  Strengthening;10 reps    Internal Rotation Weight (lbs)  1    Flexion  Strengthening;15 reps    Flexion Weight (lbs)  1    Abduction  Strengthening;15 reps    ABduction Weight (lbs)  1      Shoulder Exercises: Therapy Ball   Other Therapy Ball Exercises  green therapy ball; chest press, flexion circles (right/left) 10X      Shoulder Exercises: ROM/Strengthening   UBE (Upper Arm Bike)  Level 2' reverse 2' forward   pace: 7.0-8.0   Over Head Lace  2' each UE while seated    X to V Arms  10X with 1#               OT Short Term Goals - 04/07/20 1129  OT SHORT TERM GOAL #1   Title  Patient will be educated and independent with HEP in order to increase her BUE mobility and allow her to complete basic required tasks with less difficulty and pain.    Time  4    Period  Weeks    Status  On-going    Target Date  04/29/20      OT SHORT TERM GOAL #2   Title  Patient will increase her BUE shoulder A/ROM in order to reach behind her back and/or above shoulder level with less difficulty.    Time  4    Period  Weeks    Status  On-going      OT SHORT TERM GOAL #3   Title  Patient will increase shoulder stability while demonstrating or verbalizing her ability to complete overhead reach or sustained reaching tasks for approximately 1-2 minutes with no rest breaks.    Time  4    Period  Weeks    Status  On-going      OT SHORT TERM GOAL #4   Title  Patient decrease fascial restrictions to trace amount in order to increase functional mobility needed to complete reaching and dressing tasks.    Time  4    Period  Weeks    Status  On-going      OT SHORT TERM GOAL #5   Title  Patient will report a decrease in pain in BUE or approximately 3/10 or less while completing daily tasks.     Time  4    Period  Weeks    Status  On-going               Plan - 04/22/20 0906    Clinical Impression Statement  A: Focused on shoulder stability and strengthening. Patient was able to use green therapy ball for shoulder strengthening. Moderate difficulty initially with form and technique when completing scapular strengthening using red band. VC provided throughout session as needed for form and technique.    Body Structure / Function / Physical Skills  ADL;ROM;Fascial restriction;Mobility;Strength;Pain;UE functional use    Plan  P: Increase scapular and shoulder stability during ROM and funcitonal reaching. Progress as patient is able to tolerate.    Consulted and Agree with Plan of Care  Patient       Patient will benefit from skilled therapeutic intervention in order to improve the following deficits and impairments:   Body Structure / Function / Physical Skills: ADL, ROM, Fascial restriction, Mobility, Strength, Pain, UE functional use       Visit Diagnosis: Chronic left shoulder pain  Other symptoms and signs involving the musculoskeletal system  Stiffness of left shoulder, not elsewhere classified  Stiffness of right shoulder, not elsewhere classified  Chronic right shoulder pain    Problem List Patient Active Problem List   Diagnosis Date Noted  . Anemia 04/14/2020  . NSAID long-term use 04/14/2020  . GERD (gastroesophageal reflux disease) 01/14/2020  . Osteoarthritis of right shoulder 01/14/2020  . Ear pain, bilateral 12/17/2019  . Osteoporosis 09/16/2019  . Controlled type 2 diabetes mellitus without complication, without long-term current use of insulin ([place]) 09/16/2019  . Hyperlipidemia 09/16/2019  . Problem with vaginal pessary ([place]) 09/03/2018  . Fitting and adjustment of pessary 03/10/2015  . Uterovaginal prolapse, complete 02/18/2015  . Complete uterovaginal prolapse 02/01/2015  . Female stress incontinence 06/10/2014  . Urge incontinence  06/10/2014   Ailene Ravel, OTR/L,CBIS  [phone]  04/22/2020, 9:11 AM  [place]  [facility] [place], [place], [address] Phone: [phone]   Fax:  [phone]  Name: Kimberly Best MRN: [id number] Date of Birth: [birth year]

## 2020-04-27 ENCOUNTER — Encounter (HOSPITAL_COMMUNITY): Payer: Medicare Other | Admitting: Physical Therapy

## 2020-04-27 ENCOUNTER — Ambulatory Visit (HOSPITAL_COMMUNITY): Payer: Medicare Other | Admitting: Occupational Therapy

## 2020-04-27 ENCOUNTER — Ambulatory Visit (HOSPITAL_COMMUNITY): Payer: Medicare Other | Admitting: Physical Therapy

## 2020-04-27 ENCOUNTER — Other Ambulatory Visit: Payer: Self-pay

## 2020-04-27 ENCOUNTER — Encounter (HOSPITAL_COMMUNITY): Payer: Self-pay | Admitting: Occupational Therapy

## 2020-04-27 DIAGNOSIS — M25611 Stiffness of right shoulder, not elsewhere classified: Secondary | ICD-10-CM

## 2020-04-27 DIAGNOSIS — M6281 Muscle weakness (generalized): Secondary | ICD-10-CM | POA: Diagnosis not present

## 2020-04-27 DIAGNOSIS — M25612 Stiffness of left shoulder, not elsewhere classified: Secondary | ICD-10-CM | POA: Diagnosis not present

## 2020-04-27 DIAGNOSIS — M25511 Pain in right shoulder: Secondary | ICD-10-CM | POA: Diagnosis not present

## 2020-04-27 DIAGNOSIS — M25512 Pain in left shoulder: Secondary | ICD-10-CM | POA: Diagnosis not present

## 2020-04-27 DIAGNOSIS — R2689 Other abnormalities of gait and mobility: Secondary | ICD-10-CM

## 2020-04-27 DIAGNOSIS — G8929 Other chronic pain: Secondary | ICD-10-CM

## 2020-04-27 DIAGNOSIS — R29898 Other symptoms and signs involving the musculoskeletal system: Secondary | ICD-10-CM

## 2020-04-27 NOTE — Therapy (Signed)
[place], [place], [address] Phone: [phone]   Fax:  [phone]  Occupational Therapy Treatment  Patient Details  Name: Kimberly Best MRN: [id number] Date of Birth: 1933/04/24 Referring Provider (OT): Benny Lennert, MD   Encounter Date: 04/27/2020  OT End of Session - 04/27/20 1013    Visit Number  8    Number of Visits  9    Date for OT Re-Evaluation  04/29/20    Authorization Type  [employer] medicare    Authorization Time Period  No authorization. No visit limit $30 copay    Progress Note Due on Visit  10    OT Start Time  [phone]    OT Stop Time  1018    OT Time Calculation (min)  40 min    Activity Tolerance  Patient tolerated treatment well    Behavior During Therapy  [facility] for tasks assessed/performed       Past Medical History:  Diagnosis Date  . Allergy   . Anxiety   . Arthritis   . Cancer [facility])    right breast cancer  . Diabetes mellitus without complication ([place])   . GERD (gastroesophageal reflux disease)   . Hypertension   . Pessary maintenance 03/10/2015    Past Surgical History:  Procedure Laterality Date  . BREAST SURGERY Right    lumpectomy   . CHOLECYSTECTOMY    . EYE SURGERY      There were no vitals filed for this visit.  Subjective Assessment - 04/27/20 0932    Subjective   S: They are feeling pretty good.    Currently in Pain?  No/denies         [facility] OT Assessment - 04/27/20 0929      Assessment   Medical Diagnosis  Bilateral Shoulder Pain       Precautions   Precautions  None               OT Treatments/Exercises (OP) - 04/27/20 0937      Exercises   Exercises  Shoulder      Shoulder Exercises: Seated   Extension  Theraband;15 reps    Theraband Level (Shoulder Extension)  Level 2 (Red)    Retraction  Theraband;15 reps    Theraband Level (Shoulder Retraction)  Level 2 (Red)    Row  Theraband;15 reps    Theraband Level (Shoulder Row)  Level 2 (Red)    Protraction  Strengthening;15 reps    Protraction Weight (lbs)  1    Horizontal ABduction  Strengthening;15 reps    Horizontal ABduction Weight (lbs)  1    External Rotation  Strengthening;15 reps    External Rotation Weight (lbs)  1    Internal Rotation  Strengthening;15 reps    Internal Rotation Weight (lbs)  1    Flexion  Strengthening;15 reps    Flexion Weight (lbs)  1    Abduction  Strengthening;15 reps    ABduction Weight (lbs)  1      Shoulder Exercises: Therapy Ball   Other Therapy Ball Exercises  green therapy ball; chest press, flexion circles (right/left) 10X      Shoulder Exercises: ROM/Strengthening   UBE (Upper Arm Bike)  Level 2 3' reverse 3' forward   pace: 7.0-8.0   X to V Arms  10X with 1#, 1 rest break    Proximal Shoulder Strengthening, Seated   1 # resistance with rest breaks between each movement; 15 reps both directions  without weight; Difficulty isolating shoulder with tendency for elbow flexion    Ball on Wall  1' flexion, BUE      Functional Reaching Activities   High Level  Resistance clothespins seated, 1# wrist weight. Placing with RUE, removing with LUE               OT Short Term Goals - 04/07/20 1129      OT SHORT TERM GOAL #1   Title  Patient will be educated and independent with HEP in order to increase her BUE mobility and allow her to complete basic required tasks with less difficulty and pain.    Time  4    Period  Weeks    Status  On-going    Target Date  04/29/20      OT SHORT TERM GOAL #2   Title  Patient will increase her BUE shoulder A/ROM in order to reach behind her back and/or above shoulder level with less difficulty.    Time  4    Period  Weeks    Status  On-going      OT SHORT TERM GOAL #3   Title  Patient will increase shoulder stability while demonstrating or verbalizing her ability to complete overhead reach or sustained reaching tasks for approximately 1-2 minutes with no rest breaks.    Time  4    Period  Weeks     Status  On-going      OT SHORT TERM GOAL #4   Title  Patient decrease fascial restrictions to trace amount in order to increase functional mobility needed to complete reaching and dressing tasks.    Time  4    Period  Weeks    Status  On-going      OT SHORT TERM GOAL #5   Title  Patient will report a decrease in pain in BUE or approximately 3/10 or less while completing daily tasks.    Time  4    Period  Weeks    Status  On-going               Plan - 04/27/20 1012    Clinical Impression Statement  A: Session focusing on shoulder strengthening and stability. Continued with 1# hand weight, green therapy ball exercises, and resisted functional reaching tasks. Added ball on wall this session. Verbal cuing and occasional tactile cuing for form and technique during session.    Body Structure / Function / Physical Skills  ADL;ROM;Fascial restriction;Mobility;Strength;Pain;UE functional use    Plan  P: Reassess and discharge with updated HEP    Consulted and Agree with Plan of Care  Patient       Patient will benefit from skilled therapeutic intervention in order to improve the following deficits and impairments:   Body Structure / Function / Physical Skills: ADL, ROM, Fascial restriction, Mobility, Strength, Pain, UE functional use       Visit Diagnosis: Chronic left shoulder pain  Other symptoms and signs involving the musculoskeletal system  Stiffness of left shoulder, not elsewhere classified  Stiffness of right shoulder, not elsewhere classified  Chronic right shoulder pain    Problem List Patient Active Problem List   Diagnosis Date Noted  . Anemia 04/14/2020  . Pessary maintenance 04/14/2020  . GERD (gastroesophageal reflux disease) 01/14/2020  . Osteoarthritis of right shoulder 01/14/2020  . Ear pain, bilateral 12/17/2019  . Osteoporosis 09/16/2019  . Controlled type 2 diabetes mellitus without complication, without long-term current use of insulin  ([place]) 09/16/2019  .  Hyperlipidemia 09/16/2019  . Problem with vaginal pessary ([place]) 09/03/2018  . Fitting and adjustment of pessary 03/10/2015  . Uterovaginal prolapse, complete 02/18/2015  . Complete uterovaginal prolapse 02/01/2015  . Female stress incontinence 06/10/2014  . Urge incontinence 06/10/2014   Guadelupe Sabin, OTR/L  [phone] 04/27/2020, 10:20 AM  [place], [place], [address] Phone: [phone]   Fax:  [phone]  Name: Kimberly Best MRN: [id number] Date of Birth: [birth year]

## 2020-04-27 NOTE — Therapy (Signed)
[place], [place], [address] Phone: [phone]   Fax:  [phone]  Physical Therapy Treatment  Patient Details  Name: Kimberly Best MRN: [id number] Date of Birth: Sep 18, 1933 Referring Provider (PT): Benny Lennert   Encounter Date: 04/27/2020  PT End of Session - 04/27/20 1119    Visit Number  8    Number of Visits  12    Date for PT Re-Evaluation  05/13/20    Authorization Type  [employer]    Progress Note Due on Visit  10    PT Start Time  1049    PT Stop Time  1130    PT Time Calculation (min)  41 min    Equipment Utilized During Treatment  Gait belt    Activity Tolerance  Patient tolerated treatment well    Behavior During Therapy  [facility] for tasks assessed/performed       Past Medical History:  Diagnosis Date  . Allergy   . Anxiety   . Arthritis   . Cancer [facility])    right breast cancer  . Diabetes mellitus without complication ([place])   . GERD (gastroesophageal reflux disease)   . Hypertension   . Pessary maintenance 03/10/2015    Past Surgical History:  Procedure Laterality Date  . BREAST SURGERY Right    lumpectomy   . CHOLECYSTECTOMY    . EYE SURGERY      There were no vitals filed for this visit.  Subjective Assessment - 04/27/20 1051    Subjective  pt states her Lt hip has been hurting at 6/10.    Currently in Pain?  Yes    Pain Score  6     Pain Location  Hip    Pain Orientation  Left    Pain Descriptors / Indicators  Aching                        [facility] Adult PT Treatment/Exercise - 04/27/20 1051      Ambulation/Gait   Ambulation/Gait  Yes    Ambulation Distance (Feet)  226 Feet    Assistive device  Straight cane    Gait Pattern  Step-through pattern;Shuffle    Gait Comments  cues to increase stride but decrease speed       Exercises   Exercises  Lumbar;Knee/Hip      Lumbar Exercises: Stretches   Active Hamstring Stretch  Right;Left;3 reps;30 seconds     Single Knee to Chest Stretch  Right;Left;3 reps;20 seconds    Lower Trunk Rotation  2 reps;20 seconds    Piriformis Stretch  Right;Left;2 reps;30 seconds    Piriformis Stretch Limitations  supine crossover      Lumbar Exercises: Standing   Heel Raises  10 reps    Functional Squats  10 reps    Other Standing Lumbar Exercises  side step out pushing with leg to return x 10 B     Other Standing Lumbar Exercises  standing hip abduction and extension 10 reps each.      Lumbar Exercises: Seated   Other Seated Lumbar Exercises  sit to stand without UE assist 10 reps      Lumbar Exercises: Supine   Ab Set  10 reps    Dead Bug  10 reps    Bridge  10 reps                  PT Short Term Goals -  04/22/20 0955      PT SHORT TERM GOAL #1   Title  PT core and LE strength  to  increase to allow pt to be able to come from sit to stand with one UE assist    Time  3    Period  Weeks    Status  Achieved    Target Date  04/22/20      PT SHORT TERM GOAL #2   Title  PT to be able to ambulate 120 ft with rolling walker in a 3 minute period of time to show progression of being a household ambulator.    Time  3    Period  Weeks    Status  Achieved        PT Long Term Goals - 04/22/20 0955      PT LONG TERM GOAL #1   Title  Pt core and LE strength to increase to allow pt to be able to come sit to stand without UE assist    Time  6    Period  Weeks    Status  Achieved      PT LONG TERM GOAL #2   Title  PT to be able to stand for 10 minutes with pain no greater than a 3/10 to be able to complete self grooming activities.    Time  6    Period  Weeks    Status  Achieved      PT LONG TERM GOAL #3   Title  PT to be able to ambulate 226 ft with rolling walker in a 3' period time to be able to walk safely in her home.    Time  6    Period  Weeks    Status  Achieved      PT LONG TERM GOAL #4   Title  PT to be able to single leg stance for 10 seconds to reduce risk of falling     Time  6    Period  Weeks    Status  On-going      PT LONG TERM GOAL #5   Title  Pt core and balance to be increased to allow pt to be able to vacuum by herself    Time  3    Period  Weeks    Status  New    Target Date  05/13/20            Plan - 04/27/20 1123    Clinical Impression Statement  continued with focus on improving LE strength, stability and flexibility of tight mm.  Pt with extremely tight piriformis mm with inability to cross LT LE over RT.  Added sit to stands, standing hip strenghtening and resumed supine piriformis stretch this session.  PT requires cues with ambulation to take larger steps    Personal Factors and Comorbidities  Age;Fitness;Time since onset of injury/illness/exacerbation    Examination-Activity Limitations  Stand;Stairs;Squat;Sit;Carry;Transfers;Locomotion Level    Examination-Participation Restrictions  Cleaning;Meal Prep;Other    Stability/Clinical Decision Making  Evolving/Moderate complexity    Rehab Potential  Good    PT Frequency  2x / week    PT Duration  6 weeks    PT Treatment/Interventions  DME Instruction;Gait training;Stair training;Functional mobility training;Therapeutic activities;Therapeutic exercise;Balance training;Patient/family education;Manual techniques    PT Next Visit Plan  Continue gait training urging pt to take longer steps while walking. Continue with established therex and progress HEP as able. Follow-up regarding patient requesting about information for home assistance (  pt is checking with local agencies as she would like insurance assistance vs private pay).    PT Home Exercise Plan  given at evaluation:  LAQ, ankle pumps in sitting, Sit to stand; 4/29:  bridge, sidelying abductiona and prone SLR; 5/13:  heel raises; functional squats, bed mobility and sit to stand    Consulted and Agree with Plan of Care  Patient       Patient will benefit from skilled therapeutic intervention in order to improve the following  deficits and impairments:  Abnormal gait, Decreased activity tolerance, Decreased balance, Decreased mobility, Decreased range of motion, Difficulty walking, Decreased strength, Pain  Visit Diagnosis: Other abnormalities of gait and mobility  Muscle weakness (generalized)     Problem List Patient Active Problem List   Diagnosis Date Noted  . Anemia 04/14/2020  . Pessary maintenance 04/14/2020  . GERD (gastroesophageal reflux disease) 01/14/2020  . Osteoarthritis of right shoulder 01/14/2020  . Ear pain, bilateral 12/17/2019  . Osteoporosis 09/16/2019  . Controlled type 2 diabetes mellitus without complication, without long-term current use of insulin ([place]) 09/16/2019  . Hyperlipidemia 09/16/2019  . Problem with vaginal pessary ([place]) 09/03/2018  . Fitting and adjustment of pessary 03/10/2015  . Uterovaginal prolapse, complete 02/18/2015  . Complete uterovaginal prolapse 02/01/2015  . Female stress incontinence 06/10/2014  . Urge incontinence 06/10/2014   Teena Irani, PTA/CLT [phone]  Teena Irani 04/27/2020, 11:48 AM  [place], [place], [address] Phone: [phone]   Fax:  [phone]  Name: Kimberly Best MRN: [id number] Date of Birth: 1933/03/27

## 2020-04-29 ENCOUNTER — Ambulatory Visit (HOSPITAL_COMMUNITY): Payer: Medicare Other | Admitting: Occupational Therapy

## 2020-04-29 ENCOUNTER — Other Ambulatory Visit: Payer: Self-pay

## 2020-04-29 ENCOUNTER — Encounter (HOSPITAL_COMMUNITY): Payer: Self-pay | Admitting: Occupational Therapy

## 2020-04-29 ENCOUNTER — Ambulatory Visit (HOSPITAL_COMMUNITY): Payer: Medicare Other | Admitting: Physical Therapy

## 2020-04-29 DIAGNOSIS — R2689 Other abnormalities of gait and mobility: Secondary | ICD-10-CM | POA: Diagnosis not present

## 2020-04-29 DIAGNOSIS — M6281 Muscle weakness (generalized): Secondary | ICD-10-CM

## 2020-04-29 DIAGNOSIS — M25512 Pain in left shoulder: Secondary | ICD-10-CM | POA: Diagnosis not present

## 2020-04-29 DIAGNOSIS — R29898 Other symptoms and signs involving the musculoskeletal system: Secondary | ICD-10-CM

## 2020-04-29 DIAGNOSIS — M25611 Stiffness of right shoulder, not elsewhere classified: Secondary | ICD-10-CM | POA: Diagnosis not present

## 2020-04-29 DIAGNOSIS — M25511 Pain in right shoulder: Secondary | ICD-10-CM | POA: Diagnosis not present

## 2020-04-29 DIAGNOSIS — G8929 Other chronic pain: Secondary | ICD-10-CM

## 2020-04-29 DIAGNOSIS — M25612 Stiffness of left shoulder, not elsewhere classified: Secondary | ICD-10-CM

## 2020-04-29 NOTE — Therapy (Addendum)
[place] [address] [place], [place], [address] Phone: [phone]   Fax:  [phone]  Physical Therapy Treatment  Patient Details  Name: Kimberly Best MRN: [id number] Date of Birth: 1933-03-31 Referring Provider (PT): Benny Lennert  Progress Note Reporting Period 04/01/2020  to 04/30/2020  See note below for Objective Data and Assessment of Progress/Goals.      Encounter Date: 04/29/2020  PT End of Session - 04/29/20 1132    Visit Number  9    Number of Visits  20    Date for PT Re-Evaluation  06/06/20    Authorization Type  [employer]    Progress Note Due on Visit  19    PT Start Time  1049    PT Stop Time  1130    PT Time Calculation (min)  41 min    Equipment Utilized During Treatment  Gait belt    Activity Tolerance  Patient tolerated treatment well    Behavior During Therapy  WFL for tasks assessed/performed       Past Medical History:  Diagnosis Date  . Allergy   . Anxiety   . Arthritis   . Cancer [facility])    right breast cancer  . Diabetes mellitus without complication ([place])   . GERD (gastroesophageal reflux disease)   . Hypertension   . Pessary maintenance 03/10/2015    Past Surgical History:  Procedure Laterality Date  . BREAST SURGERY Right    lumpectomy   . CHOLECYSTECTOMY    . EYE SURGERY      There were no vitals filed for this visit.  Subjective Assessment - 04/29/20 1056    Subjective  PT continues to have back and hip pain when she is doing her housework    Pertinent History  OA, DM,    Limitations  Standing;Walking;House hold activities;Lifting    How long can you sit comfortably?  No problem    How long can you stand comfortably?  She now has increased pain in her Lt hip almost immediately she has to put the wt on her right leg.    How long can you walk comfortably?  3 minutes with a rolling walker    Patient Stated Goals  avoid a hip surgery    Currently in Pain?  Yes    Pain Score   3     Pain Location  Hip    Pain Orientation  Left    Pain Descriptors / Indicators  Aching    Pain Onset  More than a month ago         [facility] PT Assessment - 04/29/20 1059      Assessment   Medical Diagnosis  Lt hip pain     Referring Provider (PT)  Benny Lennert    Hand Dominance  Right    Next MD Visit  May 6     Prior Therapy  none       Precautions   Precautions  None    Precaution Comments  very slow moving      Restrictions   Weight Bearing Restrictions  No      Prior Function   Level of Independence  Requires assistive device for independence    Vocation  Retired    Leisure  enjoy cooking      Cognition   Overall Cognitive Status  Within Functional Limits for tasks assessed      Functional Tests   Functional tests  Sit  to Stand      Sit to Stand   Comments  unable without hands    using both hands 4 in 30 seconds      AROM   Right Hip Flexion  100    Right Hip External Rotation   25   PROM 35    Right Hip Internal Rotation   15   PROM 20    Left Hip Flexion  90   PROM 100   Left Hip External Rotation   20   PROM 30      Strength   Right Hip Flexion  4+/5    Right Hip Extension  2/5   tested sidelying    Right Hip ABduction  3/5    Left Hip Flexion  5/5   was 4/5   Left Hip Extension  3-/5   was 2+    Left Hip ABduction  4/5   was 3+   Right Knee Flexion  4+/5    Right Knee Extension  5/5   was 4+   Left Knee Flexion  4+/5   was 3-   Left Knee Extension  5/5   was 3/5    Right Ankle Dorsiflexion  5/5   was 4/5    Left Ankle Dorsiflexion  5/5   was 3/5      Ambulation/Gait   Ambulation Distance (Feet)  232 Feet   initially 80    Assistive device  Straight cane   was a rolling walker    Gait Pattern  Step-to pattern    Gait Comments  3 minute walk test        Balance   Balance Assessed  --   unable to single leg stance                              PT Short Term Goals - 04/22/20 0955      PT SHORT TERM  GOAL #1   Title  PT core and LE strength  to  increase to allow pt to be able to come from sit to stand with one UE assist    Time  3    Period  Weeks    Status  Achieved    Target Date  04/22/20      PT SHORT TERM GOAL #2   Title  PT to be able to ambulate 120 ft with rolling walker in a 3 minute period of time to show progression of being a household ambulator.    Time  3    Period  Weeks    Status  Achieved        PT Long Term Goals - 04/22/20 0955      PT LONG TERM GOAL #1   Title  Pt core and LE strength to increase to allow pt to be able to come sit to stand without UE assist    Time  6    Period  Weeks    Status  Achieved      PT LONG TERM GOAL #2   Title  PT to be able to stand for 10 minutes with pain no greater than a 3/10 to be able to complete self grooming activities.    Time  6    Period  Weeks    Status  Achieved      PT LONG TERM GOAL #3   Title  PT to be able to ambulate 226  ft with rolling walker in a 3' period time to be able to walk safely in her home.    Time  6    Period  Weeks    Status  Achieved      PT LONG TERM GOAL #4   Title  PT to be able to single leg stance for 10 seconds to reduce risk of falling    Time  6    Period  Weeks    Status  On-going      PT LONG TERM GOAL #5   Title  Pt core and balance to be increased to allow pt to be able to vacuum by herself    Time  3    Period  Weeks    Status  New    Target Date  05/13/20            Plan - 04/29/20 1218    Clinical Impression Statement  Pt reassessed with great improvement in strength but continues to have significant deficits in balance as well as pain in her RT hip.  PT will continue to benefit from skilled PT to address these issues to decrease the risk of falls.    Personal Factors and Comorbidities  Age;Fitness;Time since onset of injury/illness/exacerbation    Examination-Activity Limitations  Stand;Stairs;Squat;Sit;Carry;Transfers;Locomotion Level     Examination-Participation Restrictions  Cleaning;Meal Prep;Other    Stability/Clinical Decision Making  Evolving/Moderate complexity    Rehab Potential  Good    PT Frequency  2x / week    PT Duration  6 weeks    PT Treatment/Interventions  DME Instruction;Gait training;Stair training;Functional mobility training;Therapeutic activities;Therapeutic exercise;Balance training;Patient/family education;Manual techniques    PT Next Visit Plan  Work on balance,  check SI JT, manual to decrease hip pain as needed    PT Home Exercise Plan  given at evaluation:  LAQ, ankle pumps in sitting, Sit to stand; 4/29:  bridge, sidelying abductiona and prone SLR; 5/13:  heel raises; functional squats, bed mobility and sit to stand    Consulted and Agree with Plan of Care  Patient       Patient will benefit from skilled therapeutic intervention in order to improve the following deficits and impairments:  Abnormal gait, Decreased activity tolerance, Decreased balance, Decreased mobility, Decreased range of motion, Difficulty walking, Decreased strength, Pain  Visit Diagnosis: Other abnormalities of gait and mobility  Muscle weakness (generalized)     Problem List Patient Active Problem List   Diagnosis Date Noted  . Anemia 04/14/2020  . Pessary maintenance 04/14/2020  . GERD (gastroesophageal reflux disease) 01/14/2020  . Osteoarthritis of right shoulder 01/14/2020  . Ear pain, bilateral 12/17/2019  . Osteoporosis 09/16/2019  . Controlled type 2 diabetes mellitus without complication, without long-term current use of insulin ([place]) 09/16/2019  . Hyperlipidemia 09/16/2019  . Problem with vaginal pessary ([place]) 09/03/2018  . Fitting and adjustment of pessary 03/10/2015  . Uterovaginal prolapse, complete 02/18/2015  . Complete uterovaginal prolapse 02/01/2015  . Female stress incontinence 06/10/2014  . Urge incontinence 06/10/2014    Rayetta Humphrey, PT CLT [phone] 04/29/2020, 12:21 PM  [place], [place], [address] Phone: [phone]   Fax:  [phone]  Name: Kimberly Best MRN: [id number] Date of Birth: 1933/05/12

## 2020-04-29 NOTE — Patient Instructions (Signed)
1) Strengthening: Chest Pull - Resisted   Hold Theraband in front of body with hands about shoulder width a part. Pull band a part and back together slowly. Repeat __10__ times. Complete __1__ set(s) per session.. Repeat _1___ session(s) per day.  http://orth.exer.us/926   Copyright  VHI. All rights reserved.   2) PNF Strengthening: Resisted   Standing with resistive band around each hand, bring right arm up and away, thumb back. Repeat __10__ times per set. Do __1__ sets per session. Do __1__ sessions per day.      3) Resisted External Rotation: in Neutral - Bilateral   Sit or stand, tubing in both hands, elbows at sides, bent to 90, forearms forward. Pinch shoulder blades together and rotate forearms out. Keep elbows at sides. Repeat __10__ times per set. Do __1__ sets per session. Do __1__ sessions per day.  http://orth.exer.us/966   Copyright  VHI. All rights reserved.   4) PNF Strengthening: Resisted   Standing, hold resistive band above head. Bring right arm down and out from side. Repeat __10__ times per set. Do __1__ sets per session. Do ___1_ sessions per day.  http://orth.exer.us/922   Copyright  VHI. All rights reserved.   

## 2020-04-29 NOTE — Therapy (Signed)
[place], [place], [address] Phone: [phone]   Fax:  [phone]  Occupational Therapy Reassessment, Treatment, Discharge Summary  Patient Details  Name: Kimberly Best MRN: [id number] Date of Birth: 14-Oct-1933 Referring Provider (OT): Benny Lennert, MD   Encounter Date: 04/29/2020  OT End of Session - 04/29/20 1122    Visit Number  9    Number of Visits  10    Date for OT Re-Evaluation  04/29/20    Authorization Type  [employer] medicare    Authorization Time Period  No authorization. No visit limit $30 copay    Progress Note Due on Visit  10    OT Start Time  [phone]    OT Stop Time  1026    OT Time Calculation (min)  39 min    Activity Tolerance  Patient tolerated treatment well    Behavior During Therapy  WFL for tasks assessed/performed       Past Medical History:  Diagnosis Date  . Allergy   . Anxiety   . Arthritis   . Cancer [facility])    right breast cancer  . Diabetes mellitus without complication ([place])   . GERD (gastroesophageal reflux disease)   . Hypertension   . Pessary maintenance 03/10/2015    Past Surgical History:  Procedure Laterality Date  . BREAST SURGERY Right    lumpectomy   . CHOLECYSTECTOMY    . EYE SURGERY      There were no vitals filed for this visit.  Subjective Assessment - 04/29/20 1135    Subjective   S: Feeling good today.    Pertinent History  Patient is a 86 y/o female S/P bilateral shoulder pain caused by arthritis who recently reports increased joint stiffness and occassional pain. Dr. Holly Bodily has referred patient to occupational therapy for evaluation and treatment.    Patient Stated Goals  To increase her shoulder movement and decrease pain if possible.    Currently in Pain?  Yes    Pain Score  5     Pain Location  Hip    Pain Orientation  Left    Pain Descriptors / Indicators  Aching    Pain Type  Chronic pain    Pain Radiating Towards  NA    Pain Onset  More than a  month ago         [facility] OT Assessment - 04/29/20 0945      Assessment   Medical Diagnosis  Bilateral Shoulder Pain       Observation/Other Assessments   Focus on Therapeutic Outcomes (FOTO)   51/100      ROM / Strength   AROM / PROM / Strength  AROM;Strength      AROM   Overall AROM Comments  Assessed seated. IR/er adducted    Right Shoulder Flexion  166 Degrees   145* previous   Right Shoulder ABduction  136 Degrees    Right Shoulder Internal Rotation  90 Degrees    Right Shoulder External Rotation  90 Degrees    Left Shoulder Flexion  140 Degrees    Left Shoulder ABduction  120 Degrees    Left Shoulder Internal Rotation  90 Degrees    Left Shoulder External Rotation  84 Degrees      PROM   Overall PROM Comments  Assessed seated. IR/er adducted. WFL in all shoulder ranges.       Strength   Right Shoulder Flexion  5/5  Right Shoulder ABduction  4+/5    Right Shoulder Internal Rotation  5/5    Right Shoulder External Rotation  4+/5    Left Shoulder Flexion  5/5    Left Shoulder ABduction  4+/5    Left Shoulder Internal Rotation  5/5    Left Shoulder External Rotation  4+/5               OT Treatments/Exercises (OP) - 04/29/20 1118      Exercises   Exercises  Shoulder      Shoulder Exercises: Seated   External Rotation  Strengthening;Both;10 reps;Theraband    Theraband Level (Shoulder External Rotation)  Level 2 (Red)    Flexion  Strengthening;10 reps;Theraband    Theraband Level (Shoulder Flexion)  Level 2 (Red)    Diagonals  Strengthening;10 reps;Theraband    Theraband Level (Shoulder Diagonals)  Level 2 (Red)      Shoulder Exercises: ROM/Strengthening   X to V Arms  10X with 1#, 1 rest break    Other ROM/Strengthening Exercises  Over head lacing; 1 minute each. With 1# wrist weights             OT Education - 04/29/20 1121    Education Details  Provided HEP for shoulder strengthing with level two theraband (red). Pt demonstrating  understanding    Person(s) Educated  Patient    Methods  Demonstration;Explanation;Verbal cues;Handout    Comprehension  Returned demonstration       OT Short Term Goals - 04/29/20 0958      OT SHORT TERM GOAL #1   Title  Patient will be educated and independent with HEP in order to increase her BUE mobility and allow her to complete basic required tasks with less difficulty and pain.    Time  4    Period  Weeks    Status  Achieved    Target Date  04/29/20      OT SHORT TERM GOAL #2   Title  Patient will increase her BUE shoulder A/ROM in order to reach behind her back and/or above shoulder level with less difficulty.    Time  4    Period  Weeks    Status  Achieved      OT SHORT TERM GOAL #3   Title  Patient will increase shoulder stability while demonstrating or verbalizing her ability to complete overhead reach or sustained reaching tasks for approximately 1-2 minutes with no rest breaks.    Time  4    Period  Weeks    Status  Achieved      OT SHORT TERM GOAL #4   Title  Patient decrease fascial restrictions to trace amount in order to increase functional mobility needed to complete reaching and dressing tasks.    Time  4    Period  Weeks    Status  Not Met      OT SHORT TERM GOAL #5   Title  Patient will report a decrease in pain in BUE or approximately 3/10 or less while completing daily tasks.    Time  4    Period  Weeks    Status  Achieved               Plan - 04/29/20 1124    Clinical Impression Statement  A: Focused session on re-assessment of shoulder ROM and strength, continuing shoulder stability training, and providing HEP for discharge. Disucssed goals and met 4/5 ST goals. Pt reporting imporvded over head reaching as  well as decreased pain at bilateral shoulders.    OT Occupational Profile and History  Problem Focused Assessment - Including review of records relating to presenting problem    Occupational performance deficits (Please refer to  evaluation for details):  ADL's;Leisure;Rest and Sleep    Body Structure / Function / Physical Skills  ADL;ROM;Fascial restriction;Mobility;Strength;Pain;UE functional use    Rehab Potential  Excellent    Plan  P: Discussed goals, provided HEP, and discharged.    Consulted and Agree with Plan of Care  Patient       Patient will benefit from skilled therapeutic intervention in order to improve the following deficits and impairments:   Body Structure / Function / Physical Skills: ADL, ROM, Fascial restriction, Mobility, Strength, Pain, UE functional use       Visit Diagnosis: Chronic left shoulder pain  Other symptoms and signs involving the musculoskeletal system  Stiffness of left shoulder, not elsewhere classified  Stiffness of right shoulder, not elsewhere classified  Chronic right shoulder pain    Problem List Patient Active Problem List   Diagnosis Date Noted  . Anemia 04/14/2020  . Pessary maintenance 04/14/2020  . GERD (gastroesophageal reflux disease) 01/14/2020  . Osteoarthritis of right shoulder 01/14/2020  . Ear pain, bilateral 12/17/2019  . Osteoporosis 09/16/2019  . Controlled type 2 diabetes mellitus without complication, without long-term current use of insulin ([place]) 09/16/2019  . Hyperlipidemia 09/16/2019  . Problem with vaginal pessary ([place]) 09/03/2018  . Fitting and adjustment of pessary 03/10/2015  . Uterovaginal prolapse, complete 02/18/2015  . Complete uterovaginal prolapse 02/01/2015  . Female stress incontinence 06/10/2014  . Urge incontinence 06/10/2014    [facility] MSOT, OTR/L [place] [facility] 04/29/2020, 11:37 AM  [place] [address] [place], [place], [address] Phone: [phone]   Fax:  [phone]  Name: Kimberly Best MRN: [id number] Date of Birth: 1933-04-04    OCCUPATIONAL THERAPY DISCHARGE SUMMARY  Visits from Start of Care: 9  Current functional  level related to goals / functional outcomes: See above   Remaining deficits: Chronic shoulder pain and limited ROM (R>L)   Education / Equipment: HEP for continued ROM and strengthening  Plan: Patient agrees to discharge.  Patient goals were met. Patient is being discharged due to meeting the stated rehab goals.  ?????

## 2020-04-30 NOTE — Addendum Note (Signed)
Addended by: Leeroy Cha on: 04/30/2020 08:13 AM   Modules accepted: Orders

## 2020-05-03 ENCOUNTER — Telehealth: Payer: Self-pay | Admitting: Obstetrics and Gynecology

## 2020-05-03 NOTE — Telephone Encounter (Signed)

## 2020-05-04 ENCOUNTER — Ambulatory Visit: Payer: Medicare Other | Admitting: Obstetrics and Gynecology

## 2020-05-04 ENCOUNTER — Other Ambulatory Visit: Payer: Self-pay

## 2020-05-04 ENCOUNTER — Encounter: Payer: Self-pay | Admitting: Obstetrics and Gynecology

## 2020-05-04 ENCOUNTER — Encounter (HOSPITAL_COMMUNITY): Payer: Self-pay

## 2020-05-04 ENCOUNTER — Ambulatory Visit (HOSPITAL_COMMUNITY): Payer: Medicare Other

## 2020-05-04 VITALS — BP 142/79 | HR 89

## 2020-05-04 DIAGNOSIS — M25512 Pain in left shoulder: Secondary | ICD-10-CM | POA: Diagnosis not present

## 2020-05-04 DIAGNOSIS — R2689 Other abnormalities of gait and mobility: Secondary | ICD-10-CM | POA: Diagnosis not present

## 2020-05-04 DIAGNOSIS — M6281 Muscle weakness (generalized): Secondary | ICD-10-CM

## 2020-05-04 DIAGNOSIS — M25611 Stiffness of right shoulder, not elsewhere classified: Secondary | ICD-10-CM | POA: Diagnosis not present

## 2020-05-04 DIAGNOSIS — Z4689 Encounter for fitting and adjustment of other specified devices: Secondary | ICD-10-CM

## 2020-05-04 DIAGNOSIS — R29898 Other symptoms and signs involving the musculoskeletal system: Secondary | ICD-10-CM | POA: Diagnosis not present

## 2020-05-04 DIAGNOSIS — T839XXD Unspecified complication of genitourinary prosthetic device, implant and graft, subsequent encounter: Secondary | ICD-10-CM

## 2020-05-04 DIAGNOSIS — M25511 Pain in right shoulder: Secondary | ICD-10-CM | POA: Diagnosis not present

## 2020-05-04 DIAGNOSIS — M25612 Stiffness of left shoulder, not elsewhere classified: Secondary | ICD-10-CM | POA: Diagnosis not present

## 2020-05-04 DIAGNOSIS — G8929 Other chronic pain: Secondary | ICD-10-CM | POA: Diagnosis not present

## 2020-05-04 MED ORDER — MICONAZOLE NITRATE 2 % VA CREA: 1.0000 | TOPICAL_CREAM | Freq: Every day | VAGINAL | Status: DC

## 2020-05-04 MED ORDER — MICONAZOLE NITRATE 2 % VA CREA: 1.0000 | TOPICAL_CREAM | Freq: Every day | VAGINAL | 0 refills | Status: DC

## 2020-05-04 NOTE — Therapy (Signed)
[place], [place], [address] Phone: [phone]   Fax:  [phone]  Physical Therapy Treatment  Patient Details  Name: Kimberly Best MRN: [id number] Date of Birth: 01-19-1933 Referring Provider (PT): Benny Lennert   Encounter Date: 05/04/2020  PT End of Session - 05/04/20 1312    Visit Number  10    Number of Visits  20    Date for PT Re-Evaluation  06/06/20    Authorization Type  [employer]    Progress Note Due on Visit  19    PT Start Time  1310    PT Stop Time  1350    PT Time Calculation (min)  40 min    Equipment Utilized During Treatment  Gait belt    Activity Tolerance  Patient tolerated treatment well    Behavior During Therapy  [facility] for tasks assessed/performed       Past Medical History:  Diagnosis Date  . Allergy   . Anxiety   . Arthritis   . Cancer [facility])    right breast cancer  . Diabetes mellitus without complication ([place])   . GERD (gastroesophageal reflux disease)   . Hypertension   . Pessary maintenance 03/10/2015    Past Surgical History:  Procedure Laterality Date  . BREAST SURGERY Right    lumpectomy   . CHOLECYSTECTOMY    . EYE SURGERY      There were no vitals filed for this visit.  Subjective Assessment - 05/04/20 1306    Subjective  Pain when standing of walking for prolonged period of time, no pain upon standing.    Pertinent History  OA, DM,    Patient Stated Goals  avoid a hip surgery    Currently in Pain?  No/denies    Pain Onset  More than a month ago    Pain Frequency  Intermittent    Aggravating Factors   sleeping    Pain Relieving Factors  none stated    Effect of Pain on Daily Activities  no effect                        [facility] Adult PT Treatment/Exercise - 05/04/20 0001      Lumbar Exercises: Standing   Other Standing Lumbar Exercises  side step out pushing with leg to return x 10 B; standing hip abd/ext 10x each; sidestep and retro  gait 2RT    Other Standing Lumbar Exercises  Tandem stance solid 1x30"; dynamic surface 2x 30"; vector stance 3x 5"      Lumbar Exercises: Seated   Sit to Stand  10 reps    Sit to Stand Limitations  no HHA; eccentric control               PT Short Term Goals - 04/22/20 0955      PT SHORT TERM GOAL #1   Title  PT core and LE strength  to  increase to allow pt to be able to come from sit to stand with one UE assist    Time  3    Period  Weeks    Status  Achieved    Target Date  04/22/20      PT SHORT TERM GOAL #2   Title  PT to be able to ambulate 120 ft with rolling walker in a 3 minute period of time to show progression of being a household ambulator.    Time  3    Period  Weeks    Status  Achieved        PT Long Term Goals - 04/22/20 0955      PT LONG TERM GOAL #1   Title  Pt core and LE strength to increase to allow pt to be able to come sit to stand without UE assist    Time  6    Period  Weeks    Status  Achieved      PT LONG TERM GOAL #2   Title  PT to be able to stand for 10 minutes with pain no greater than a 3/10 to be able to complete self grooming activities.    Time  6    Period  Weeks    Status  Achieved      PT LONG TERM GOAL #3   Title  PT to be able to ambulate 226 ft with rolling walker in a 3' period time to be able to walk safely in her home.    Time  6    Period  Weeks    Status  Achieved      PT LONG TERM GOAL #4   Title  PT to be able to single leg stance for 10 seconds to reduce risk of falling    Time  6    Period  Weeks    Status  On-going      PT LONG TERM GOAL #5   Title  Pt core and balance to be increased to allow pt to be able to vacuum by herself    Time  3    Period  Weeks    Status  New    Target Date  05/13/20            Plan - 05/04/20 1354    Clinical Impression Statement  Session focus on balance training and hip strengthening.  Pt required min A for LOB during NBOS or dynamic surfaces.  Verbal cueing for  posture and to increase stride length during gait activiites.  No reports of pain, was limited by fatigue.    Personal Factors and Comorbidities  Age;Fitness;Time since onset of injury/illness/exacerbation    Examination-Activity Limitations  Stand;Stairs;Squat;Sit;Carry;Transfers;Locomotion Level    Examination-Participation Restrictions  Cleaning;Meal Prep;Other    Stability/Clinical Decision Making  Evolving/Moderate complexity    Clinical Decision Making  Moderate    Rehab Potential  Good    PT Frequency  2x / week    PT Duration  6 weeks    PT Treatment/Interventions  DME Instruction;Gait training;Stair training;Functional mobility training;Therapeutic activities;Therapeutic exercise;Balance training;Patient/family education;Manual techniques    PT Next Visit Plan  Work on balance,  check SI JT, manual to decrease hip pain as needed    PT Home Exercise Plan  given at evaluation:  LAQ, ankle pumps in sitting, Sit to stand; 4/29:  bridge, sidelying abductiona and prone SLR; 5/13:  heel raises; functional squats, bed mobility and sit to stand       Patient will benefit from skilled therapeutic intervention in order to improve the following deficits and impairments:  Abnormal gait, Decreased activity tolerance, Decreased balance, Decreased mobility, Decreased range of motion, Difficulty walking, Decreased strength, Pain  Visit Diagnosis: Other abnormalities of gait and mobility  Muscle weakness (generalized)     Problem List Patient Active Problem List   Diagnosis Date Noted  . Anemia 04/14/2020  . Pessary maintenance 04/14/2020  . GERD (gastroesophageal reflux disease) 01/14/2020  . Osteoarthritis of  right shoulder 01/14/2020  . Ear pain, bilateral 12/17/2019  . Osteoporosis 09/16/2019  . Controlled type 2 diabetes mellitus without complication, without long-term current use of insulin ([place]) 09/16/2019  . Hyperlipidemia 09/16/2019  . Problem with vaginal pessary ([place]) 09/03/2018   . Fitting and adjustment of pessary 03/10/2015  . Uterovaginal prolapse, complete 02/18/2015  . Complete uterovaginal prolapse 02/01/2015  . Female stress incontinence 06/10/2014  . Urge incontinence 06/10/2014   Ihor Austin, LPTA/CLT; CBIS [phone]  Aldona Lento 05/04/2020, 1:58 PM  [place], [place], [address] Phone: [phone]   Fax:  [phone]  Name: LITZY CASALETTO MRN: [id number] Date of Birth: 02-Aug-1933

## 2020-05-04 NOTE — Progress Notes (Signed)
PATIENT ID: Kimberly Best, female     DOB: [birth year], 86 y.o.     MRN: [id number]   [facility] Visit  05/04/20     PATIENT NAME: Kimberly Best     MRN [id number]     DOB: 05/22/1933  CC & HPI:  [name] is a 86 y.o. female presenting today for pessary reinsertion. At her last visit we removed her pessary and instructed her to take it home and soak it in vinegar. She did as instructed, and brought it back today.  She was having significant irritation and discomfort due to yeast infection vaginitis.  That has improved  She denies pelvis discomfort.   The pessary was replaced without complication.   ROS:  ROS   Pertinent History Reviewed:  Reviewed: Significant for diabetes Medical         Past Medical History:  Diagnosis Date  . Allergy   . Anxiety   . Arthritis   . Cancer [facility])    right breast cancer  . Diabetes mellitus without complication ([place])   . GERD (gastroesophageal reflux disease)   . Hypertension   . Pessary maintenance 03/10/2015                              Surgical Hx:    Past Surgical History:  Procedure Laterality Date  . BREAST SURGERY Right    lumpectomy   . CHOLECYSTECTOMY    . EYE SURGERY     Medications: Reviewed & Updated - see associated section                       Current Outpatient Medications:  .  aspirin EC 81 MG tablet, Take 81 mg by mouth daily., Disp: , Rfl:  .  cholecalciferol (VITAMIN D) 1000 UNITS tablet, Take 1,000 Units by mouth daily., Disp: , Rfl:  .  Ferrous Sulfate 134 MG TABS, Take 1 tablet (134 mg total) by mouth daily., Disp: 30 tablet, Rfl: 2 .  lisinopril (ZESTRIL) 2.5 MG tablet, Take 2.5 mg by mouth daily., Disp: , Rfl:  .  metFORMIN (GLUCOPHAGE) 500 MG tablet, Take one with largest meal, Disp: 90 tablet, Rfl: 1 .  pantoprazole (PROTONIX) 40 MG tablet, Take 1 tablet (40 mg total) by mouth daily before breakfast., Disp: 30 tablet, Rfl: 5   Social History: Reviewed -  reports that she has never smoked. She  has never used smokeless tobacco.  Objective Findings:  Vitals: Blood pressure (!) 142/79, pulse 89.  PHYSICAL EXAMINATION General appearance - alert, well appearing, and in no distress, oriented to person, place, and time and normal appearing weight Mental status - alert, oriented to person, place, and time, normal mood, behavior, speech, dress, motor activity, and thought processes, affect appropriate to mood Chest - not examined Heart - not examined Abdomen - not examined Breasts - not examined Skin - normal coloration and turgor, no rashes, no suspicious skin lesions noted   Assessment & Plan:   A:  1. Pessary reinsertion, [place] pessary with drain  P:  1. Return as needed   By signing my name below, I, [employer], attest that this documentation has been prepared under the direction and in the presence of [name], MD. Electronically Signed: [place]. 05/04/20. 3:46 PM.  I personally performed the services described in this documentation, which was SCRIBED in my presence. The recorded  information has been reviewed and considered accurate. It has been edited as necessary during review. [name], MD

## 2020-05-05 ENCOUNTER — Ambulatory Visit (INDEPENDENT_AMBULATORY_CARE_PROVIDER_SITE_OTHER): Payer: Medicare Other | Admitting: Nurse Practitioner

## 2020-05-05 ENCOUNTER — Encounter (INDEPENDENT_AMBULATORY_CARE_PROVIDER_SITE_OTHER): Payer: Self-pay | Admitting: Nurse Practitioner

## 2020-05-05 VITALS — BP 125/75 | HR 75 | Temp 97.2°F | Ht <= 58 in | Wt 166.2 lb

## 2020-05-05 DIAGNOSIS — E119 Type 2 diabetes mellitus without complications: Secondary | ICD-10-CM

## 2020-05-05 DIAGNOSIS — D509 Iron deficiency anemia, unspecified: Secondary | ICD-10-CM

## 2020-05-05 MED ORDER — LISINOPRIL 2.5 MG PO TABS: 2.5000 mg | ORAL_TABLET | Freq: Every day | ORAL | 0 refills | Status: DC

## 2020-05-05 NOTE — Progress Notes (Addendum)
Subjective:  Patient ID: Kimberly Best, female    DOB: 30-Apr-1933  Age: 86 y.o. MRN: [id number]  CC:  Chief Complaint  Patient presents with  . Establish Care  . Medication Refill      HPI  This patient arrives today to establish care.  She comes [place] today because her previous provider is leaving the area.  She is 86 years of age and she has a medical history significant for GERD, type 2 diabetes, osteoporosis, iron deficiency anemia, and hyperlipidemia.  She has no acute concerns today, but does need a refill on her lisinopril.   Past Medical History:  Diagnosis Date  . Allergy   . Anxiety   . Arthritis   . Cancer [facility])    right breast cancer  . Diabetes mellitus without complication ([place])   . GERD (gastroesophageal reflux disease)   . Hypertension   . Pessary maintenance 03/10/2015      Family History  Problem Relation Age of Onset  . Cancer Father   . Stroke Mother   . Hypertension Mother   . Diabetes Sister   . Hypertension Sister   . Cancer Brother   . Hypertension Brother   . Bipolar disorder Daughter   . Hypertension Maternal Grandmother   . Tuberculosis Paternal Grandmother   . Hypertension Paternal Grandmother   . Alcohol abuse Paternal Grandfather   . Hypertension Paternal Grandfather   . Colon cancer Neg Hx     Social History   Social History Narrative  . Not on file   Social History   Tobacco Use  . Smoking status: Never Smoker  . Smokeless tobacco: Never Used  Substance Use Topics  . Alcohol use: No     Current Meds  Medication Sig  . aspirin EC 81 MG tablet Take 81 mg by mouth daily.  . cholecalciferol (VITAMIN D) 1000 UNITS tablet Take 1,000 Units by mouth daily.  . Ferrous Sulfate 134 MG TABS Take 1 tablet (134 mg total) by mouth daily.  Marland Kitchen lisinopril (ZESTRIL) 2.5 MG tablet Take 1 tablet (2.5 mg total) by mouth daily.  . metFORMIN (GLUCOPHAGE) 500 MG tablet Take one with largest meal  . miconazole (MONISTAT 7 SIMPLY CURE)  2 % vaginal cream Place 1 Applicatorful vaginally at bedtime.  . pantoprazole (PROTONIX) 40 MG tablet Take 1 tablet (40 mg total) by mouth daily before breakfast.  . [DISCONTINUED] lisinopril (ZESTRIL) 2.5 MG tablet Take 2.5 mg by mouth daily.   Current Facility-Administered Medications for the 05/05/20 encounter (Office Visit) with [name], NP  Medication  . miconazole (MONISTAT 7) 2 % vaginal cream 1 Applicatorful    ROS:  Review of Systems  Constitutional: Negative for fever and weight loss.  Eyes: Negative for blurred vision and double vision.  Respiratory: Positive for cough. Negative for shortness of breath and wheezing.   Cardiovascular: Negative for chest pain and palpitations.  Neurological: Negative for dizziness and headaches.     Objective:   Today's Vitals: BP 125/75 (BP Location: Left Arm, Patient Position: Sitting, Cuff Size: Normal)   Pulse 75   Temp (!) 97.2 F (36.2 C) (Temporal)   Ht 4\' 10"  (1.473 m)   Wt 166 lb 3.2 oz (75.4 kg)   SpO2 98%   BMI 34.74 kg/m  Vitals with BMI 05/05/2020 05/04/2020 04/20/2020  Height 4\' 10"  - 4\' 10"   Weight 166 lbs 3 oz - 164 lbs 6 oz  BMI A999333 - [id number]  Systolic 0000000  A999333 A999333  Diastolic 75 79 76  Pulse 75 89 86     Physical Exam Vitals reviewed.  Constitutional:      General: She is not in acute distress.    Appearance: Normal appearance.  HENT:     Head: Normocephalic and atraumatic.  Neck:     Vascular: No carotid bruit.  Cardiovascular:     Rate and Rhythm: Normal rate and regular rhythm.     Pulses: Normal pulses.     Heart sounds: Normal heart sounds.  Pulmonary:     Effort: Pulmonary effort is normal.     Breath sounds: Normal breath sounds.  Skin:    General: Skin is warm and dry.  Neurological:     General: No focal deficit present.     Mental Status: She is alert and oriented to person, place, and time.  Psychiatric:        Mood and Affect: Mood normal.        Behavior: Behavior normal.         Judgment: Judgment normal.          Assessment and Plan   1. Controlled type 2 diabetes mellitus without complication, without long-term current use of insulin ([place])   2. Iron deficiency anemia, unspecified iron deficiency anemia type      Plan: 1.  I am going to refill her lisinopril for her today.  I did get a flag in the electronic medical record that she has an allergy to lisinopril.  I did discuss this with her, and she denies any nagging dry cough, or lip/tongue/throat swelling since taking lisinopril.  I did tell her that if this were to occur she should notify this office.  In the review of systems she did mention a cough, but tells me it is fairly infrequent, and feels that it is elicited by having to wear a mask due to the COVID-19 pandemic.  She will continue on her other chronic medications as prescribed.  2. She mentioned she is out of her iron supplement.  I will recheck CBC, iron, TIBC, and ferritin level today.  I encouraged her to follow-up with her gastroenterologist for colonoscopy which she tells me is coming up in a few weeks.  She tells me she understands.  May need to have her restart her iron supplement once results come back.   Tests ordered Orders Placed This Encounter  Procedures  . CBC  . Iron and TIBC  . Ferritin      Meds ordered this encounter  Medications  . lisinopril (ZESTRIL) 2.5 MG tablet    Sig: Take 1 tablet (2.5 mg total) by mouth daily.    Dispense:  90 tablet    Refill:  0    Order Specific Question:   Supervising Provider    Answer:   Doree Albee [id number]    Patient to follow-up in 1 month  [place], NP

## 2020-05-06 ENCOUNTER — Ambulatory Visit (HOSPITAL_COMMUNITY): Payer: Medicare Other

## 2020-05-06 ENCOUNTER — Encounter (INDEPENDENT_AMBULATORY_CARE_PROVIDER_SITE_OTHER): Payer: Self-pay

## 2020-05-06 ENCOUNTER — Other Ambulatory Visit (INDEPENDENT_AMBULATORY_CARE_PROVIDER_SITE_OTHER): Payer: Self-pay | Admitting: Nurse Practitioner

## 2020-05-06 ENCOUNTER — Encounter (HOSPITAL_COMMUNITY): Payer: Self-pay

## 2020-05-06 ENCOUNTER — Other Ambulatory Visit: Payer: Self-pay

## 2020-05-06 DIAGNOSIS — R2689 Other abnormalities of gait and mobility: Secondary | ICD-10-CM

## 2020-05-06 DIAGNOSIS — M25612 Stiffness of left shoulder, not elsewhere classified: Secondary | ICD-10-CM | POA: Diagnosis not present

## 2020-05-06 DIAGNOSIS — M25512 Pain in left shoulder: Secondary | ICD-10-CM | POA: Diagnosis not present

## 2020-05-06 DIAGNOSIS — R29898 Other symptoms and signs involving the musculoskeletal system: Secondary | ICD-10-CM | POA: Diagnosis not present

## 2020-05-06 DIAGNOSIS — M25511 Pain in right shoulder: Secondary | ICD-10-CM | POA: Diagnosis not present

## 2020-05-06 DIAGNOSIS — M6281 Muscle weakness (generalized): Secondary | ICD-10-CM | POA: Diagnosis not present

## 2020-05-06 DIAGNOSIS — G8929 Other chronic pain: Secondary | ICD-10-CM | POA: Diagnosis not present

## 2020-05-06 DIAGNOSIS — M25611 Stiffness of right shoulder, not elsewhere classified: Secondary | ICD-10-CM | POA: Diagnosis not present

## 2020-05-06 LAB — IRON, TOTAL/TOTAL IRON BINDING CAP
%SAT: 16 % (calc) (ref 16–45)
Iron: 45 ug/dL (ref 45–160)
TIBC: 288 mcg/dL (calc) (ref 250–450)

## 2020-05-06 LAB — CBC
HCT: 29.1 % — ABNORMAL LOW (ref 35.0–45.0)
Hemoglobin: 9.4 g/dL — ABNORMAL LOW (ref 11.7–15.5)
MCH: 28.6 pg (ref 27.0–33.0)
MCHC: 32.3 g/dL (ref 32.0–36.0)
MCV: 88.4 fL (ref 80.0–100.0)
MPV: 9.4 fL (ref 7.5–12.5)
Platelets: 278 10*3/uL (ref 140–400)
RBC: 3.29 10*6/uL — ABNORMAL LOW (ref 3.80–5.10)
RDW: 13.8 % (ref 11.0–15.0)
WBC: 4.6 10*3/uL (ref 3.8–10.8)

## 2020-05-06 LAB — FERRITIN: Ferritin: 38 ng/mL (ref 16–288)

## 2020-05-06 MED ORDER — FERROUS SULFATE 134 MG PO TABS: 1.0000 | ORAL_TABLET | Freq: Every day | ORAL | 2 refills | Status: DC

## 2020-05-06 NOTE — Therapy (Signed)
[place], [place], [address] Phone: [phone]   Fax:  [phone]  Physical Therapy Treatment  Patient Details  Name: Kimberly Best MRN: [id number] Date of Birth: 11/29/1933 Referring Provider (PT): Benny Lennert   Encounter Date: 05/06/2020  PT End of Session - 05/06/20 0842    Visit Number  11    Number of Visits  20    Date for PT Re-Evaluation  06/06/20    Authorization Type  [employer] care    Progress Note Due on Visit  19    PT Start Time  0827    PT Stop Time  0915    PT Time Calculation (min)  48 min    Equipment Utilized During Treatment  Gait belt    Activity Tolerance  Patient tolerated treatment well    Behavior During Therapy  [facility] for tasks assessed/performed       Past Medical History:  Diagnosis Date  . Allergy   . Anxiety   . Arthritis   . Cancer [facility])    right breast cancer  . Diabetes mellitus without complication ([place])   . GERD (gastroesophageal reflux disease)   . Hypertension   . Pessary maintenance 03/10/2015    Past Surgical History:  Procedure Laterality Date  . BREAST SURGERY Right    lumpectomy   . CHOLECYSTECTOMY    . EYE SURGERY      There were no vitals filed for this visit.  Subjective Assessment - 05/06/20 0824    Subjective  Pt stated she continues to have pain while walking for long periods of time.    Pertinent History  OA, DM,    Patient Stated Goals  avoid a hip surgery    Currently in Pain?  Yes    Pain Score  2     Pain Location  Hip    Pain Orientation  Left    Pain Descriptors / Indicators  Aching;Sore    Pain Type  Chronic pain    Pain Radiating Towards  NA    Pain Onset  More than a month ago    Pain Frequency  Intermittent    Aggravating Factors   sleeping    Pain Relieving Factors  none stated    Effect of Pain on Daily Activities  no effect                        [facility] Adult PT Treatment/Exercise - 05/06/20 0001       Lumbar Exercises: Standing   Functional Squats  10 reps    Other Standing Lumbar Exercises  side step out pushing with leg to return x 10 B; standing hip abd/ext 10x each; sidestep and retro gait 2RT    Other Standing Lumbar Exercises  Tandem stance dynamic surface 2x 30"; vector stance 3x 5";  toe tapping 6in step height      Lumbar Exercises: Seated   Sit to Stand  10 reps    Sit to Stand Limitations  no HHA; eccentric control      Manual Therapy   Manual Therapy  Other (comment)    Manual therapy comments  manual therapy completed seperately from all other interventions this date    Soft tissue mobilization  IASTM using tennis ball to LT lateral/ posterior hip musculature to address pain and restriciton, patient in RT SL    Other Manual Therapy  Checked SI alignment, no MET required  PT Short Term Goals - 04/22/20 0955      PT SHORT TERM GOAL #1   Title  PT core and LE strength  to  increase to allow pt to be able to come from sit to stand with one UE assist    Time  3    Period  Weeks    Status  Achieved    Target Date  04/22/20      PT SHORT TERM GOAL #2   Title  PT to be able to ambulate 120 ft with rolling walker in a 3 minute period of time to show progression of being a household ambulator.    Time  3    Period  Weeks    Status  Achieved        PT Long Term Goals - 04/22/20 0955      PT LONG TERM GOAL #1   Title  Pt core and LE strength to increase to allow pt to be able to come sit to stand without UE assist    Time  6    Period  Weeks    Status  Achieved      PT LONG TERM GOAL #2   Title  PT to be able to stand for 10 minutes with pain no greater than a 3/10 to be able to complete self grooming activities.    Time  6    Period  Weeks    Status  Achieved      PT LONG TERM GOAL #3   Title  PT to be able to ambulate 226 ft with rolling walker in a 3' period time to be able to walk safely in her home.    Time  6    Period  Weeks     Status  Achieved      PT LONG TERM GOAL #4   Title  PT to be able to single leg stance for 10 seconds to reduce risk of falling    Time  6    Period  Weeks    Status  On-going      PT LONG TERM GOAL #5   Title  Pt core and balance to be increased to allow pt to be able to vacuum by herself    Time  3    Period  Weeks    Status  New    Target Date  05/13/20            Plan - 05/06/20 1638    Clinical Impression Statement  Checked SI, within alignment and no need for MET.  Continued balance training and functional strengthening with min A required for safety.  Resumed squats for gluteal strengthening with multimodal cueing to improve mechanics and reduce lumbar flexion.  EOS with manual Lt hip/gluteal region for pain control.    Personal Factors and Comorbidities  Age;Fitness;Time since onset of injury/illness/exacerbation    Examination-Activity Limitations  Stand;Stairs;Squat;Sit;Carry;Transfers;Locomotion Level    Examination-Participation Restrictions  Cleaning;Meal Prep;Other    Stability/Clinical Decision Making  Evolving/Moderate complexity    Clinical Decision Making  Moderate    Rehab Potential  Good    PT Frequency  2x / week    PT Duration  6 weeks    PT Treatment/Interventions  DME Instruction;Gait training;Stair training;Functional mobility training;Therapeutic activities;Therapeutic exercise;Balance training;Patient/family education;Manual techniques    PT Next Visit Plan  Work on balance,  check SI JT, manual to decrease hip pain as needed    PT Home Exercise Plan  given at evaluation:  LAQ, ankle pumps in sitting, Sit to stand; 4/29:  bridge, sidelying abductiona and prone SLR; 5/13:  heel raises; functional squats, bed mobility and sit to stand       Patient will benefit from skilled therapeutic intervention in order to improve the following deficits and impairments:  Abnormal gait, Decreased activity tolerance, Decreased balance, Decreased mobility, Decreased  range of motion, Difficulty walking, Decreased strength, Pain  Visit Diagnosis: Other abnormalities of gait and mobility  Muscle weakness (generalized)     Problem List Patient Active Problem List   Diagnosis Date Noted  . Anemia 04/14/2020  . Pessary maintenance 04/14/2020  . GERD (gastroesophageal reflux disease) 01/14/2020  . Osteoarthritis of right shoulder 01/14/2020  . Ear pain, bilateral 12/17/2019  . Osteoporosis 09/16/2019  . Controlled type 2 diabetes mellitus without complication, without long-term current use of insulin ([place]) 09/16/2019  . Hyperlipidemia 09/16/2019  . Problem with vaginal pessary ([place]) 09/03/2018  . Fitting and adjustment of pessary 03/10/2015  . Uterovaginal prolapse, complete 02/18/2015  . Complete uterovaginal prolapse 02/01/2015  . Female stress incontinence 06/10/2014  . Urge incontinence 06/10/2014   Ihor Austin, LPTA/CLT; CBIS [phone]  Aldona Lento 05/06/2020, 4:40 PM  [place], [place], [address] Phone: [phone]   Fax:  [phone]  Name: Kimberly Best MRN: [id number] Date of Birth: 1933/09/19

## 2020-05-11 ENCOUNTER — Other Ambulatory Visit: Payer: Self-pay

## 2020-05-11 ENCOUNTER — Ambulatory Visit (HOSPITAL_COMMUNITY): Payer: Medicare Other | Attending: Family Medicine | Admitting: Physical Therapy

## 2020-05-11 ENCOUNTER — Encounter (HOSPITAL_COMMUNITY): Payer: Self-pay | Admitting: Physical Therapy

## 2020-05-11 DIAGNOSIS — R29898 Other symptoms and signs involving the musculoskeletal system: Secondary | ICD-10-CM | POA: Diagnosis not present

## 2020-05-11 DIAGNOSIS — A048 Other specified bacterial intestinal infections: Secondary | ICD-10-CM

## 2020-05-11 DIAGNOSIS — M25512 Pain in left shoulder: Secondary | ICD-10-CM | POA: Insufficient documentation

## 2020-05-11 DIAGNOSIS — M6281 Muscle weakness (generalized): Secondary | ICD-10-CM | POA: Diagnosis not present

## 2020-05-11 DIAGNOSIS — R2689 Other abnormalities of gait and mobility: Secondary | ICD-10-CM | POA: Diagnosis not present

## 2020-05-11 DIAGNOSIS — G8929 Other chronic pain: Secondary | ICD-10-CM | POA: Insufficient documentation

## 2020-05-11 HISTORY — DX: Other specified bacterial intestinal infections: A04.8

## 2020-05-11 NOTE — Therapy (Signed)
[place], [place], [address] Phone: [phone]   Fax:  [phone]  Physical Therapy Treatment  Patient Details  Name: Kimberly Best MRN: [id number] Date of Birth: 07-29-1933 Referring Provider (PT): Benny Lennert   Encounter Date: 05/11/2020  PT End of Session - 05/11/20 0859    Visit Number  13    Number of Visits  20    Date for PT Re-Evaluation  06/06/20    Authorization Type  [employer]    Progress Note Due on Visit  19    PT Start Time  0830    PT Stop Time  0910    PT Time Calculation (min)  40 min    Equipment Utilized During Treatment  Gait belt    Activity Tolerance  Patient tolerated treatment well    Behavior During Therapy  [facility] for tasks assessed/performed       Past Medical History:  Diagnosis Date  . Allergy   . Anxiety   . Arthritis   . Cancer [facility])    right breast cancer  . Diabetes mellitus without complication ([place])   . GERD (gastroesophageal reflux disease)   . Hypertension   . Pessary maintenance 03/10/2015    Past Surgical History:  Procedure Laterality Date  . BREAST SURGERY Right    lumpectomy   . CHOLECYSTECTOMY    . EYE SURGERY      There were no vitals filed for this visit.  Subjective Assessment - 05/11/20 0831    Subjective  PT staes she just wants to get her housework done.    Pertinent History  OA, DM,    Limitations  Standing;Walking;House hold activities;Lifting    How long can you sit comfortably?  No problem    How long can you stand comfortably?  She now has increased pain in her Lt hip almost immediately she has to put the wt on her right leg.    How long can you walk comfortably?  3 minutes with a rolling walker    Patient Stated Goals  avoid a hip surgery    Currently in Pain?  Yes    Pain Score  2     Pain Location  Hip    Pain Orientation  Left    Pain Descriptors / Indicators  Aching    Pain Radiating Towards  leg    Pain Onset  More than  a month ago    Pain Frequency  Intermittent    Aggravating Factors   wt bearing    Pain Relieving Factors  sitting    Effect of Pain on Daily Activities  limits                        [facility] Adult PT Treatment/Exercise - 05/11/20 0001      Exercises   Exercises  Lumbar;Knee/Hip      Lumbar Exercises: Stretches   Active Hamstring Stretch  Right;Left;3 reps;30 seconds    Single Knee to Chest Stretch  Right;Left;3 reps;20 seconds    Piriformis Stretch  Left;2 reps    Piriformis Stretch Limitations  supine      Lumbar Exercises: Standing   Functional Squats  10 reps    Other Standing Lumbar Exercises  tandem gt. retro gft, side step x 2 RT     Other Standing Lumbar Exercises  tandem stance with head turns and UE flexion B, wall arch x 10; y lift  off x 10      Lumbar Exercises: Seated   Sit to Stand  15 reps      Lumbar Exercises: Supine   Bridge  10 reps    Bridge Limitations  2 x     Other Supine Lumbar Exercises  hip ab/adduction x 10 / 2 times       Manual Therapy   Manual Therapy  Muscle Energy Technique    Manual therapy comments  manual therapy completed seperately from all other interventions this date    Muscle Energy Technique  to correct posterior rotattion of Lt SI jt                PT Short Term Goals - 04/22/20 0955      PT SHORT TERM GOAL #1   Title  PT core and LE strength  to  increase to allow pt to be able to come from sit to stand with one UE assist    Time  3    Period  Weeks    Status  Achieved    Target Date  04/22/20      PT SHORT TERM GOAL #2   Title  PT to be able to ambulate 120 ft with rolling walker in a 3 minute period of time to show progression of being a household ambulator.    Time  3    Period  Weeks    Status  Achieved        PT Long Term Goals - 04/22/20 0955      PT LONG TERM GOAL #1   Title  Pt core and LE strength to increase to allow pt to be able to come sit to stand without UE assist    Time  6     Period  Weeks    Status  Achieved      PT LONG TERM GOAL #2   Title  PT to be able to stand for 10 minutes with pain no greater than a 3/10 to be able to complete self grooming activities.    Time  6    Period  Weeks    Status  Achieved      PT LONG TERM GOAL #3   Title  PT to be able to ambulate 226 ft with rolling walker in a 3' period time to be able to walk safely in her home.    Time  6    Period  Weeks    Status  Achieved      PT LONG TERM GOAL #4   Title  PT to be able to single leg stance for 10 seconds to reduce risk of falling    Time  6    Period  Weeks    Status  On-going      PT LONG TERM GOAL #5   Title  Pt core and balance to be increased to allow pt to be able to vacuum by herself    Time  3    Period  Weeks    Status  New    Target Date  05/13/20            Plan - 05/11/20 0916    Clinical Impression Statement  Noted posterior rotation of Lt SI joint.  Pt completed muscle energy techniques for correction with positive resule.  PT worked on both balance and gluteal strengthening with noted decreased balance.    Personal Factors and Comorbidities  Age;Fitness;Time since onset of injury/illness/exacerbation  Examination-Activity Limitations  Stand;Stairs;Squat;Sit;Carry;Transfers;Locomotion Level    Examination-Participation Restrictions  Cleaning;Meal Prep;Other    Stability/Clinical Decision Making  Evolving/Moderate complexity    Rehab Potential  Good    PT Frequency  2x / week    PT Treatment/Interventions  DME Instruction;Gait training;Stair training;Functional mobility training;Therapeutic activities;Therapeutic exercise;Balance training;Patient/family education;Manual techniques    PT Next Visit Plan  Check SI continue to complete manual to address Lt hip pain and to exercises to address balance.    PT Home Exercise Plan  given at evaluation:  LAQ, ankle pumps in sitting, Sit to stand; 4/29:  bridge, sidelying abductiona and prone SLR; 5/13:   heel raises; functional squats, bed mobility and sit to stand       Patient will benefit from skilled therapeutic intervention in order to improve the following deficits and impairments:  Abnormal gait, Decreased activity tolerance, Decreased balance, Decreased mobility, Decreased range of motion, Difficulty walking, Decreased strength, Pain  Visit Diagnosis: Other abnormalities of gait and mobility  Muscle weakness (generalized)     Problem List Patient Active Problem List   Diagnosis Date Noted  . Anemia 04/14/2020  . Pessary maintenance 04/14/2020  . GERD (gastroesophageal reflux disease) 01/14/2020  . Osteoarthritis of right shoulder 01/14/2020  . Ear pain, bilateral 12/17/2019  . Osteoporosis 09/16/2019  . Controlled type 2 diabetes mellitus without complication, without long-term current use of insulin ([place]) 09/16/2019  . Hyperlipidemia 09/16/2019  . Problem with vaginal pessary ([place]) 09/03/2018  . Fitting and adjustment of pessary 03/10/2015  . Uterovaginal prolapse, complete 02/18/2015  . Complete uterovaginal prolapse 02/01/2015  . Female stress incontinence 06/10/2014  . Urge incontinence 06/10/2014    Rayetta Humphrey, PT CLT [phone] 05/11/2020, 9:20 AM  [place] [address] [place], [place], [address] Phone: [phone]   Fax:  [phone]  Name: Kimberly Best MRN: [id number] Date of Birth: 16-Feb-1933

## 2020-05-12 ENCOUNTER — Ambulatory Visit: Payer: Medicare Other | Admitting: Family Medicine

## 2020-05-13 ENCOUNTER — Other Ambulatory Visit: Payer: Self-pay

## 2020-05-13 ENCOUNTER — Ambulatory Visit: Payer: Medicare Other | Admitting: Family Medicine

## 2020-05-13 ENCOUNTER — Encounter (HOSPITAL_COMMUNITY): Payer: Self-pay | Admitting: Physical Therapy

## 2020-05-13 ENCOUNTER — Ambulatory Visit (HOSPITAL_COMMUNITY): Payer: Medicare Other | Admitting: Physical Therapy

## 2020-05-13 DIAGNOSIS — R29898 Other symptoms and signs involving the musculoskeletal system: Secondary | ICD-10-CM | POA: Diagnosis not present

## 2020-05-13 DIAGNOSIS — M6281 Muscle weakness (generalized): Secondary | ICD-10-CM

## 2020-05-13 DIAGNOSIS — R2689 Other abnormalities of gait and mobility: Secondary | ICD-10-CM | POA: Diagnosis not present

## 2020-05-13 DIAGNOSIS — M25512 Pain in left shoulder: Secondary | ICD-10-CM | POA: Diagnosis not present

## 2020-05-13 DIAGNOSIS — G8929 Other chronic pain: Secondary | ICD-10-CM | POA: Diagnosis not present

## 2020-05-13 NOTE — Therapy (Signed)
[place], [place], [address] Phone: [phone]   Fax:  [phone]  Physical Therapy Treatment  Patient Details  Name: Kimberly Best MRN: [id number] Date of Birth: [birth year] Referring Provider (PT): Benny Lennert   Encounter Date: 05/13/2020  PT End of Session - 05/13/20 1021    Visit Number  14    Number of Visits  20    Date for PT Re-Evaluation  06/06/20    Authorization Type  [employer]    Progress Note Due on Visit  19    PT Start Time  1000    PT Stop Time  1045    PT Time Calculation (min)  45 min    Equipment Utilized During Treatment  Gait belt    Activity Tolerance  Patient tolerated treatment well    Behavior During Therapy  [facility] for tasks assessed/performed       Past Medical History:  Diagnosis Date  . Allergy   . Anxiety   . Arthritis   . Cancer [facility])    right breast cancer  . Diabetes mellitus without complication ([place])   . GERD (gastroesophageal reflux disease)   . Hypertension   . Pessary maintenance 03/10/2015    Past Surgical History:  Procedure Laterality Date  . BREAST SURGERY Right    lumpectomy   . CHOLECYSTECTOMY    . EYE SURGERY      There were no vitals filed for this visit.  Subjective Assessment - 05/13/20 0958    Subjective  PT states that her hips are doing better. Pt feels that she continues to improve but still needs to rest during housework.    Pertinent History  OA, DM,    Limitations  Standing;Walking;House hold activities;Lifting    How long can you sit comfortably?  No problem    How long can you stand comfortably?  She now has increased pain in her Lt hip almost immediately she has to put the wt on her right leg.    How long can you walk comfortably?  3 minutes with a rolling walker    Patient Stated Goals  avoid a hip surgery    Currently in Pain?  Yes    Pain Score  2     Pain Location  Calf    Pain Orientation  Right;Left    Pain Descriptors  / Indicators  Aching    Pain Type  Chronic pain    Pain Onset  More than a month ago                        [facility] Adult PT Treatment/Exercise - 05/13/20 0001      Exercises   Exercises  Lumbar;Knee/Hip      Lumbar Exercises: Stretches   Single Knee to Chest Stretch  Right;Left;3 reps;20 seconds    Piriformis Stretch  Right;Left;3 reps;30 seconds      Lumbar Exercises: Standing   Heel Raises  10 reps    Heel Raises Limitations  wall arch x 10    Functional Squats  10 reps    Other Standing Lumbar Exercises  tandem gt. retro gft, side step  with green tband x 2 RT     Other Standing Lumbar Exercises  tandem stance with dowel 2# flexion x 10       Lumbar Exercises: Seated   Sit to Stand  15 reps      Lumbar Exercises:  Supine   Bridge  10 reps;Other (comment)    Bridge Limitations  holding for 15 seconds     Other Supine Lumbar Exercises  hip ab/adduction x 10 / 2 times                PT Short Term Goals - 04/22/20 0955      PT SHORT TERM GOAL #1   Title  PT core and LE strength  to  increase to allow pt to be able to come from sit to stand with one UE assist    Time  3    Period  Weeks    Status  Achieved    Target Date  04/22/20      PT SHORT TERM GOAL #2   Title  PT to be able to ambulate 120 ft with rolling walker in a 3 minute period of time to show progression of being a household ambulator.    Time  3    Period  Weeks    Status  Achieved        PT Long Term Goals - 04/22/20 0955      PT LONG TERM GOAL #1   Title  Pt core and LE strength to increase to allow pt to be able to come sit to stand without UE assist    Time  6    Period  Weeks    Status  Achieved      PT LONG TERM GOAL #2   Title  PT to be able to stand for 10 minutes with pain no greater than a 3/10 to be able to complete self grooming activities.    Time  6    Period  Weeks    Status  Achieved      PT LONG TERM GOAL #3   Title  PT to be able to ambulate 226 ft  with rolling walker in a 3' period time to be able to walk safely in her home.    Time  6    Period  Weeks    Status  Achieved      PT LONG TERM GOAL #4   Title  PT to be able to single leg stance for 10 seconds to reduce risk of falling    Time  6    Period  Weeks    Status  On-going      PT LONG TERM GOAL #5   Title  Pt core and balance to be increased to allow pt to be able to vacuum by herself    Time  3    Period  Weeks    Status  New    Target Date  05/13/20            Plan - 05/13/20 1022    Clinical Impression Statement  SI with normal alighnment.  Pt treatment focused on both balance and strengthening.  Pt  will continue to benefit from high balance activites    Personal Factors and Comorbidities  Age;Fitness;Time since onset of injury/illness/exacerbation    Examination-Activity Limitations  Stand;Stairs;Squat;Sit;Carry;Transfers;Locomotion Level    Examination-Participation Restrictions  Cleaning;Meal Prep;Other    Stability/Clinical Decision Making  Evolving/Moderate complexity    Rehab Potential  Good    PT Frequency  2x / week    PT Treatment/Interventions  DME Instruction;Gait training;Stair training;Functional mobility training;Therapeutic activities;Therapeutic exercise;Balance training;Patient/family education;Manual techniques    PT Next Visit Plan  attempt foam tandem stance for higher balance, stepping over 4" steps  PT Home Exercise Plan  given at evaluation:  LAQ, ankle pumps in sitting, Sit to stand; 4/29:  bridge, sidelying abductiona and prone SLR; 5/13:  heel raises; functional squats, bed mobility and sit to stand       Patient will benefit from skilled therapeutic intervention in order to improve the following deficits and impairments:  Abnormal gait, Decreased activity tolerance, Decreased balance, Decreased mobility, Decreased range of motion, Difficulty walking, Decreased strength, Pain  Visit Diagnosis: Other abnormalities of gait and  mobility  Muscle weakness (generalized)     Problem List Patient Active Problem List   Diagnosis Date Noted  . Anemia 04/14/2020  . Pessary maintenance 04/14/2020  . GERD (gastroesophageal reflux disease) 01/14/2020  . Osteoarthritis of right shoulder 01/14/2020  . Ear pain, bilateral 12/17/2019  . Osteoporosis 09/16/2019  . Controlled type 2 diabetes mellitus without complication, without long-term current use of insulin ([place]) 09/16/2019  . Hyperlipidemia 09/16/2019  . Problem with vaginal pessary ([place]) 09/03/2018  . Fitting and adjustment of pessary 03/10/2015  . Uterovaginal prolapse, complete 02/18/2015  . Complete uterovaginal prolapse 02/01/2015  . Female stress incontinence 06/10/2014  . Urge incontinence 06/10/2014    Rayetta Humphrey, PT CLT [phone] 05/13/2020, 10:46 AM  [place] [address] [place], [place], [address] Phone: [phone]   Fax:  [phone]  Name: Kimberly Best MRN: [id number] Date of Birth: 1933-11-27

## 2020-05-18 ENCOUNTER — Encounter (HOSPITAL_COMMUNITY): Payer: Self-pay

## 2020-05-18 ENCOUNTER — Ambulatory Visit (HOSPITAL_COMMUNITY): Payer: Medicare Other

## 2020-05-18 ENCOUNTER — Other Ambulatory Visit: Payer: Self-pay

## 2020-05-18 DIAGNOSIS — M6281 Muscle weakness (generalized): Secondary | ICD-10-CM | POA: Diagnosis not present

## 2020-05-18 DIAGNOSIS — R2689 Other abnormalities of gait and mobility: Secondary | ICD-10-CM

## 2020-05-18 DIAGNOSIS — R29898 Other symptoms and signs involving the musculoskeletal system: Secondary | ICD-10-CM | POA: Diagnosis not present

## 2020-05-18 DIAGNOSIS — M25512 Pain in left shoulder: Secondary | ICD-10-CM | POA: Diagnosis not present

## 2020-05-18 DIAGNOSIS — G8929 Other chronic pain: Secondary | ICD-10-CM | POA: Diagnosis not present

## 2020-05-18 NOTE — Therapy (Signed)
[place], [place], [address] Phone: [phone]   Fax:  [phone]  Physical Therapy Treatment  Patient Details  Name: Kimberly Best MRN: [id number] Date of Birth: 10/02/1933 Referring Provider (PT): Benny Lennert   Encounter Date: 05/18/2020  PT End of Session - 05/18/20 0827    Visit Number  15    Number of Visits  20    Date for PT Re-Evaluation  06/06/20    Authorization Type  [employer]    Progress Note Due on Visit  19    PT Start Time  0825    PT Stop Time  0912    PT Time Calculation (min)  47 min    Equipment Utilized During Treatment  Gait belt    Activity Tolerance  Patient tolerated treatment well    Behavior During Therapy  [facility] for tasks assessed/performed       Past Medical History:  Diagnosis Date  . Allergy   . Anxiety   . Arthritis   . Cancer [facility])    right breast cancer  . Diabetes mellitus without complication ([place])   . GERD (gastroesophageal reflux disease)   . Hypertension   . Pessary maintenance 03/10/2015    Past Surgical History:  Procedure Laterality Date  . BREAST SURGERY Right    lumpectomy   . CHOLECYSTECTOMY    . EYE SURGERY      There were no vitals filed for this visit.  Subjective Assessment - 05/18/20 0825    Subjective  Pt stated she is feeling good today, has been completing exercises at home.    Patient Stated Goals  avoid a hip surgery    Currently in Pain?  Yes    Pain Score  3     Pain Location  Hip    Pain Orientation  Left    Pain Descriptors / Indicators  Aching    Pain Type  Chronic pain    Pain Onset  More than a month ago    Pain Frequency  Intermittent    Aggravating Factors   weight bearing    Pain Relieving Factors  sitting    Effect of Pain on Daily Activities  limits                        [facility] Adult PT Treatment/Exercise - 05/18/20 0001      Lumbar Exercises: Standing   Heel Raises  10 reps    Heel Raises  Limitations  wall arch x 10    Functional Squats  10 reps    Other Standing Lumbar Exercises  tandem gt. retro gft, side step  with green tband x 2 RT; tandem stance of foam intermittent HHA 3x 30"    Other Standing Lumbar Exercises  6 in hurdles intermittent HHA; step up 4in step BLE      Lumbar Exercises: Seated   Sit to Stand  15 reps    Sit to Stand Limitations  no HHA; eccentric control               PT Short Term Goals - 04/22/20 0955      PT SHORT TERM GOAL #1   Title  PT core and LE strength  to  increase to allow pt to be able to come from sit to stand with one UE assist    Time  3    Period  Weeks  Status  Achieved    Target Date  04/22/20      PT SHORT TERM GOAL #2   Title  PT to be able to ambulate 120 ft with rolling walker in a 3 minute period of time to show progression of being a household ambulator.    Time  3    Period  Weeks    Status  Achieved        PT Long Term Goals - 04/22/20 0955      PT LONG TERM GOAL #1   Title  Pt core and LE strength to increase to allow pt to be able to come sit to stand without UE assist    Time  6    Period  Weeks    Status  Achieved      PT LONG TERM GOAL #2   Title  PT to be able to stand for 10 minutes with pain no greater than a 3/10 to be able to complete self grooming activities.    Time  6    Period  Weeks    Status  Achieved      PT LONG TERM GOAL #3   Title  PT to be able to ambulate 226 ft with rolling walker in a 3' period time to be able to walk safely in her home.    Time  6    Period  Weeks    Status  Achieved      PT LONG TERM GOAL #4   Title  PT to be able to single leg stance for 10 seconds to reduce risk of falling    Time  6    Period  Weeks    Status  On-going      PT LONG TERM GOAL #5   Title  Pt core and balance to be increased to allow pt to be able to vacuum by herself    Time  3    Period  Weeks    Status  New    Target Date  05/13/20            Plan - 05/18/20 0916     Clinical Impression Statement  SI within alignment, no MET required this session.  Session focus with balance and functional strengthening with cueing to improve mechanics.  HHA required on dynamic surface.  Pt limited by fatigue wiht activities, no reports of increased pain at EOS.    Personal Factors and Comorbidities  Age;Fitness;Time since onset of injury/illness/exacerbation    Examination-Activity Limitations  Stand;Stairs;Squat;Sit;Carry;Transfers;Locomotion Level    Examination-Participation Restrictions  Cleaning;Meal Prep;Other    Stability/Clinical Decision Making  Evolving/Moderate complexity    Clinical Decision Making  Moderate    Rehab Potential  Good    PT Frequency  2x / week    PT Duration  6 weeks    PT Treatment/Interventions  DME Instruction;Gait training;Stair training;Functional mobility training;Therapeutic activities;Therapeutic exercise;Balance training;Patient/family education;Manual techniques    PT Next Visit Plan  continue higher balance training with foam, reduce HHA with hurdles    PT Home Exercise Plan  given at evaluation:  LAQ, ankle pumps in sitting, Sit to stand; 4/29:  bridge, sidelying abductiona and prone SLR; 5/13:  heel raises; functional squats, bed mobility and sit to stand       Patient will benefit from skilled therapeutic intervention in order to improve the following deficits and impairments:  Abnormal gait, Decreased activity tolerance, Decreased balance, Decreased mobility, Decreased range of motion, Difficulty walking, Decreased  strength, Pain  Visit Diagnosis: Other abnormalities of gait and mobility  Muscle weakness (generalized)     Problem List Patient Active Problem List   Diagnosis Date Noted  . Anemia 04/14/2020  . Pessary maintenance 04/14/2020  . GERD (gastroesophageal reflux disease) 01/14/2020  . Osteoarthritis of right shoulder 01/14/2020  . Ear pain, bilateral 12/17/2019  . Osteoporosis 09/16/2019  . Controlled type  2 diabetes mellitus without complication, without long-term current use of insulin ([place]) 09/16/2019  . Hyperlipidemia 09/16/2019  . Problem with vaginal pessary ([place]) 09/03/2018  . Fitting and adjustment of pessary 03/10/2015  . Uterovaginal prolapse, complete 02/18/2015  . Complete uterovaginal prolapse 02/01/2015  . Female stress incontinence 06/10/2014  . Urge incontinence 06/10/2014   Ihor Austin, LPTA/CLT; CBIS [phone]  Aldona Lento 05/18/2020, 11:53 AM  [place], [place], [address] Phone: [phone]   Fax:  [phone]  Name: ROWENE SUTO MRN: [id number] Date of Birth: Feb 08, 1933

## 2020-05-20 ENCOUNTER — Ambulatory Visit (HOSPITAL_COMMUNITY): Payer: Medicare Other | Admitting: Physical Therapy

## 2020-05-20 ENCOUNTER — Other Ambulatory Visit: Payer: Self-pay

## 2020-05-20 DIAGNOSIS — R2689 Other abnormalities of gait and mobility: Secondary | ICD-10-CM

## 2020-05-20 DIAGNOSIS — R29898 Other symptoms and signs involving the musculoskeletal system: Secondary | ICD-10-CM | POA: Diagnosis not present

## 2020-05-20 DIAGNOSIS — M6281 Muscle weakness (generalized): Secondary | ICD-10-CM | POA: Diagnosis not present

## 2020-05-20 DIAGNOSIS — G8929 Other chronic pain: Secondary | ICD-10-CM | POA: Diagnosis not present

## 2020-05-20 DIAGNOSIS — M25512 Pain in left shoulder: Secondary | ICD-10-CM | POA: Diagnosis not present

## 2020-05-20 NOTE — Therapy (Signed)
[place], [place], [address] Phone: [phone]   Fax:  [phone]  Physical Therapy Treatment  Patient Details  Name: Kimberly Best MRN: [id number] Date of Birth: 1933/01/26 Referring Provider (PT): Benny Lennert   Encounter Date: 05/20/2020   PT End of Session - 05/20/20 1115    Visit Number 16    Number of Visits 20    Date for PT Re-Evaluation 06/06/20    Authorization Type [employer]    Progress Note Due on Visit 19    PT Start Time 1115    PT Stop Time 1200    PT Time Calculation (min) 45 min    Equipment Utilized During Treatment Gait belt    Activity Tolerance Patient tolerated treatment well    Behavior During Therapy [facility] for tasks assessed/performed           Past Medical History:  Diagnosis Date  . Allergy   . Anxiety   . Arthritis   . Cancer [facility])    right breast cancer  . Diabetes mellitus without complication ([place])   . GERD (gastroesophageal reflux disease)   . Hypertension   . Pessary maintenance 03/10/2015    Past Surgical History:  Procedure Laterality Date  . BREAST SURGERY Right    lumpectomy   . CHOLECYSTECTOMY    . EYE SURGERY      There were no vitals filed for this visit.   Subjective Assessment - 05/20/20 1116    Subjective PT has no complaints today. Her back is bothering her but that is nothing new.    Pertinent History OA, DM,    Limitations Standing;Walking;House hold activities;Lifting    How long can you sit comfortably? No problem    How long can you stand comfortably? She now has increased pain in her Lt hip almost immediately she has to put the wt on her right leg.    How long can you walk comfortably? 3 minutes with a rolling walker    Patient Stated Goals avoid a hip surgery    Currently in Pain? Yes    Pain Score 5     Pain Location Back    Pain Orientation Lower    Pain Descriptors / Indicators Aching    Pain Type Chronic pain    Pain Onset  More than a month ago                             [facility] Adult PT Treatment/Exercise - 05/20/20 0001      Ambulation/Gait   Ambulation Distance (Feet) 226 Feet    Assistive device None      Lumbar Exercises: Stretches   Single Knee to Chest Stretch Right;Left;3 reps;20 seconds      Lumbar Exercises: Standing   Heel Raises Limitations wall arch x 10      Lumbar Exercises: Supine   Bridge 10 reps;Other (comment)    Other Supine Lumbar Exercises hip ab/adduction x 10 / 2 times       Lumbar Exercises: Sidelying   Hip Abduction Both;10 reps      Lumbar Exercises: Prone   Straight Leg Raise 10 reps               Balance Exercises - 05/20/20 0001      Balance Exercises: Standing   Tandem Gait Forward;Foam/compliant surface;2 reps    Partial Tandem Stance Eyes open;Other reps (comment)  10 rep taking 2# dowel into flexion, then 10 x pro/retracito   Sidestepping Foam/compliant support;1 rep    Marching Solid surface;Static;15 reps    Sit to Stand --   15              PT Short Term Goals - 04/22/20 0955      PT SHORT TERM GOAL #1   Title PT core and LE strength  to  increase to allow pt to be able to come from sit to stand with one UE assist    Time 3    Period Weeks    Status Achieved    Target Date 04/22/20      PT SHORT TERM GOAL #2   Title PT to be able to ambulate 120 ft with rolling walker in a 3 minute period of time to show progression of being a household ambulator.    Time 3    Period Weeks    Status Achieved             PT Long Term Goals - 04/22/20 0955      PT LONG TERM GOAL #1   Title Pt core and LE strength to increase to allow pt to be able to come sit to stand without UE assist    Time 6    Period Weeks    Status Achieved      PT LONG TERM GOAL #2   Title PT to be able to stand for 10 minutes with pain no greater than a 3/10 to be able to complete self grooming activities.    Time 6    Period Weeks    Status  Achieved      PT LONG TERM GOAL #3   Title PT to be able to ambulate 226 ft with rolling walker in a 3' period time to be able to walk safely in her home.    Time 6    Period Weeks    Status Achieved      PT LONG TERM GOAL #4   Title PT to be able to single leg stance for 10 seconds to reduce risk of falling    Time 6    Period Weeks    Status On-going      PT LONG TERM GOAL #5   Title Pt core and balance to be increased to allow pt to be able to vacuum by herself    Time 3    Period Weeks    Status New    Target Date 05/13/20                 Plan - 05/20/20 1204    Clinical Impression Statement PT treatment focused on improving balance and gluteal strength.  PT able to complete all activites for balance using hand hold assist for safety.  Pt continues to have decreased glut maximus strength although improving, medius has improved significantly.    PT Next Visit Plan contine to work on balance and gluteal strength           Patient will benefit from skilled therapeutic intervention in order to improve the following deficits and impairments:     Visit Diagnosis: Other abnormalities of gait and mobility  Muscle weakness (generalized)     Problem List Patient Active Problem List   Diagnosis Date Noted  . Anemia 04/14/2020  . Pessary maintenance 04/14/2020  . GERD (gastroesophageal reflux disease) 01/14/2020  . Osteoarthritis of right shoulder 01/14/2020  . Ear pain,  bilateral 12/17/2019  . Osteoporosis 09/16/2019  . Controlled type 2 diabetes mellitus without complication, without long-term current use of insulin ([place]) 09/16/2019  . Hyperlipidemia 09/16/2019  . Problem with vaginal pessary ([place]) 09/03/2018  . Fitting and adjustment of pessary 03/10/2015  . Uterovaginal prolapse, complete 02/18/2015  . Complete uterovaginal prolapse 02/01/2015  . Female stress incontinence 06/10/2014  . Urge incontinence 06/10/2014    Rayetta Humphrey, PT  CLT [phone] 05/20/2020, 12:07 PM  [facility] [address] [place], [place], [address] Phone: [phone]   Fax:  [phone]  Name: Kimberly Best MRN: [id number] Date of Birth: Feb 24, 1933

## 2020-05-24 ENCOUNTER — Other Ambulatory Visit (HOSPITAL_COMMUNITY)
Admission: RE | Admit: 2020-05-24 | Discharge: 2020-05-24 | Disposition: A | Discharge status: Home / Self Care | Payer: Medicare Other | Source: Ambulatory Visit | Attending: Internal Medicine | Admitting: Internal Medicine

## 2020-05-24 ENCOUNTER — Other Ambulatory Visit: Payer: Self-pay

## 2020-05-24 DIAGNOSIS — Z01812 Encounter for preprocedural laboratory examination: Secondary | ICD-10-CM | POA: Diagnosis not present

## 2020-05-24 DIAGNOSIS — Z20822 Contact with and (suspected) exposure to covid-19: Secondary | ICD-10-CM | POA: Diagnosis not present

## 2020-05-24 LAB — SARS CORONAVIRUS 2 (TAT 6-24 HRS): SARS Coronavirus 2: NEGATIVE

## 2020-05-25 ENCOUNTER — Encounter (HOSPITAL_COMMUNITY): Payer: Medicare Other | Admitting: Physical Therapy

## 2020-05-26 ENCOUNTER — Ambulatory Visit (HOSPITAL_COMMUNITY)
Admission: RE | Admit: 2020-05-26 | Discharge: 2020-05-26 | Disposition: A | Discharge status: Home / Self Care | Payer: Medicare Other | Attending: Internal Medicine | Admitting: Internal Medicine

## 2020-05-26 ENCOUNTER — Encounter: Payer: Self-pay | Admitting: Internal Medicine

## 2020-05-26 ENCOUNTER — Other Ambulatory Visit: Payer: Self-pay

## 2020-05-26 ENCOUNTER — Encounter (HOSPITAL_COMMUNITY)
Admission: RE | Disposition: A | Discharge status: Home / Self Care | Payer: Self-pay | Source: Home / Self Care | Attending: Internal Medicine

## 2020-05-26 DIAGNOSIS — K219 Gastro-esophageal reflux disease without esophagitis: Secondary | ICD-10-CM | POA: Insufficient documentation

## 2020-05-26 DIAGNOSIS — Z7982 Long term (current) use of aspirin: Secondary | ICD-10-CM | POA: Diagnosis not present

## 2020-05-26 DIAGNOSIS — Z853 Personal history of malignant neoplasm of breast: Secondary | ICD-10-CM | POA: Diagnosis not present

## 2020-05-26 DIAGNOSIS — K5730 Diverticulosis of large intestine without perforation or abscess without bleeding: Secondary | ICD-10-CM | POA: Insufficient documentation

## 2020-05-26 DIAGNOSIS — I10 Essential (primary) hypertension: Secondary | ICD-10-CM | POA: Insufficient documentation

## 2020-05-26 DIAGNOSIS — Z79899 Other long term (current) drug therapy: Secondary | ICD-10-CM | POA: Diagnosis not present

## 2020-05-26 DIAGNOSIS — K3189 Other diseases of stomach and duodenum: Secondary | ICD-10-CM

## 2020-05-26 DIAGNOSIS — E119 Type 2 diabetes mellitus without complications: Secondary | ICD-10-CM | POA: Insufficient documentation

## 2020-05-26 DIAGNOSIS — D509 Iron deficiency anemia, unspecified: Secondary | ICD-10-CM | POA: Diagnosis not present

## 2020-05-26 DIAGNOSIS — B9681 Helicobacter pylori [H. pylori] as the cause of diseases classified elsewhere: Secondary | ICD-10-CM | POA: Diagnosis not present

## 2020-05-26 DIAGNOSIS — Z7984 Long term (current) use of oral hypoglycemic drugs: Secondary | ICD-10-CM | POA: Diagnosis not present

## 2020-05-26 DIAGNOSIS — K2970 Gastritis, unspecified, without bleeding: Secondary | ICD-10-CM | POA: Diagnosis not present

## 2020-05-26 DIAGNOSIS — R1310 Dysphagia, unspecified: Secondary | ICD-10-CM | POA: Insufficient documentation

## 2020-05-26 HISTORY — PX: COLONOSCOPY: SHX5424

## 2020-05-26 HISTORY — PX: BIOPSY: SHX5522

## 2020-05-26 HISTORY — PX: ESOPHAGOGASTRODUODENOSCOPY: SHX5428

## 2020-05-26 LAB — GLUCOSE, CAPILLARY: Glucose-Capillary: 107 mg/dL — ABNORMAL HIGH (ref 70–99)

## 2020-05-26 SURGERY — COLONOSCOPY
Anesthesia: Moderate Sedation

## 2020-05-26 MED ORDER — MEPERIDINE HCL 50 MG/ML IJ SOLN
INTRAMUSCULAR | Status: AC
  Filled 2020-05-26: qty 1

## 2020-05-26 MED ORDER — LIDOCAINE VISCOUS HCL 2 % MT SOLN
OROMUCOSAL | Status: AC
  Filled 2020-05-26: qty 15

## 2020-05-26 MED ORDER — LIDOCAINE VISCOUS HCL 2 % MT SOLN
OROMUCOSAL | Status: DC | PRN
  Administered 2020-05-26: 5 mL via OROMUCOSAL

## 2020-05-26 MED ORDER — MEPERIDINE HCL 100 MG/ML IJ SOLN
INTRAMUSCULAR | Status: DC | PRN
  Administered 2020-05-26: 25 mg

## 2020-05-26 MED ORDER — MIDAZOLAM HCL 5 MG/5ML IJ SOLN
INTRAMUSCULAR | Status: DC | PRN
  Administered 2020-05-26: 1 mg via INTRAVENOUS
  Administered 2020-05-26 (×2): 0.5 mg via INTRAVENOUS
  Administered 2020-05-26: 1 mg via INTRAVENOUS

## 2020-05-26 MED ORDER — ONDANSETRON HCL 4 MG/2ML IJ SOLN
INTRAMUSCULAR | Status: DC | PRN
  Administered 2020-05-26: 4 mg via INTRAVENOUS

## 2020-05-26 MED ORDER — SODIUM CHLORIDE 0.9 % IV SOLN: INTRAVENOUS | Status: DC

## 2020-05-26 MED ORDER — ONDANSETRON HCL 4 MG/2ML IJ SOLN
INTRAMUSCULAR | Status: AC
  Filled 2020-05-26: qty 2

## 2020-05-26 MED ORDER — MIDAZOLAM HCL 5 MG/5ML IJ SOLN
INTRAMUSCULAR | Status: AC
  Filled 2020-05-26: qty 10

## 2020-05-26 NOTE — H&P (Signed)
@LOGO @   Primary Care Physician:  [name], NP Primary Gastroenterologist:  Dr. Gala Romney  Pre-Procedure History & Physical: HPI:  Kimberly Best is a 86 y.o. female here for EGD to evaluate GERD esophageal dysphagia and iron deficiency anemia.  Also here for her first ever colonoscopy.  Past Medical History:  Diagnosis Date  . Allergy   . Anxiety   . Arthritis   . Cancer [facility])    right breast cancer  . Diabetes mellitus without complication ([place])   . GERD (gastroesophageal reflux disease)   . Hypertension   . Pessary maintenance 03/10/2015    Past Surgical History:  Procedure Laterality Date  . BREAST SURGERY Right    lumpectomy   . CHOLECYSTECTOMY    . EYE SURGERY      Prior to Admission medications   Medication Sig Start Date End Date Taking? Authorizing Provider  acetaminophen (TYLENOL) 650 MG CR tablet Take 650 mg by mouth 2 (two) times daily as needed for pain.   Yes [provider]  aspirin EC 81 MG tablet Take 243 mg by mouth daily.    Yes [provider]  cholecalciferol (VITAMIN D) 1000 UNITS tablet Take 1,000 Units by mouth daily.   Yes [provider]  fluticasone (FLONASE) 50 MCG/ACT nasal spray Place 2 sprays into both nostrils daily as needed for allergies or rhinitis.   Yes [provider]  lisinopril (ZESTRIL) 2.5 MG tablet Take 1 tablet (2.5 mg total) by mouth daily. 05/05/20  Yes [name], NP  metFORMIN (GLUCOPHAGE) 500 MG tablet Take one with largest meal Patient taking differently: Take 500 mg by mouth daily with supper.  01/14/20  Yes Corum, Rex Kras, MD  miconazole (MONISTAT 7 SIMPLY CURE) 2 % vaginal cream Place 1 Applicatorful vaginally at bedtime. 05/04/20  Yes [name], MD  Multiple Vitamins-Minerals (IMMUNE SUPPORT PO) Take 1 tablet by mouth daily.   Yes [provider]  pantoprazole (PROTONIX) 40 MG tablet Take 1 tablet (40 mg total) by mouth daily before breakfast. 04/14/20  Yes Harper, Kristen S,  PA-C  SLOW FE 142 (45 Fe) MG TBCR Take 142 mg by mouth daily.  05/06/20  Yes [provider]  Ferrous Sulfate 134 MG TABS Take 1 tablet (134 mg total) by mouth daily. Patient not taking: Reported on 05/17/2020 05/06/20   [name], NP    Allergies as of 04/20/2020 - Review Complete 04/20/2020  Allergen Reaction Noted  . Ace inhibitors  09/16/2019  . Penicillins Swelling 11/18/2014    Family History  Problem Relation Age of Onset  . Cancer Father   . Stroke Mother   . Hypertension Mother   . Diabetes Sister   . Hypertension Sister   . Cancer Brother   . Hypertension Brother   . Bipolar disorder Daughter   . Hypertension Maternal Grandmother   . Tuberculosis Paternal Grandmother   . Hypertension Paternal Grandmother   . Alcohol abuse Paternal Grandfather   . Hypertension Paternal Grandfather   . Colon cancer Neg Hx     Social History   Socioeconomic History  . Marital status: Married    Spouse name: Not on file  . Number of children: Not on file  . Years of education: Not on file  . Highest education level: Not on file  Occupational History  . Not on file  Tobacco Use  . Smoking status: Never Smoker  . Smokeless tobacco: Never Used  Vaping Use  .  Vaping Use: Never used  Substance and Sexual Activity  . Alcohol use: No  . Drug use: No  . Sexual activity: Not Currently    Birth control/protection: Post-menopausal  Other Topics Concern  . Not on file  Social History Narrative  . Not on file   Social Determinants of Health   Financial Resource Strain:   . Difficulty of Paying Living Expenses:   Food Insecurity:   . Worried About Charity fundraiser in the Last Year:   . Arboriculturist in the Last Year:   Transportation Needs:   . Film/video editor (Medical):   Marland Kitchen Lack of Transportation (Non-Medical):   Physical Activity:   . Days of Exercise per Week:   . Minutes of Exercise per Session:   Stress:   . Feeling of Stress :   Social  Connections:   . Frequency of Communication with Friends and Family:   . Frequency of Social Gatherings with Friends and Family:   . Attends Religious Services:   . Active Member of Clubs or Organizations:   . Attends Archivist Meetings:   Marland Kitchen Marital Status:   Intimate Partner Violence:   . Fear of Current or Ex-Partner:   . Emotionally Abused:   Marland Kitchen Physically Abused:   . Sexually Abused:     Review of Systems: See HPI, otherwise negative ROS  Physical Exam: BP 133/78   Pulse 88   Temp 98.9 F (37.2 C) (Oral)   Resp 18   SpO2 100%  General:   Alert,  Well-developed, well-nourished, pleasant and cooperative in NAD Neck:  Supple; no masses or thyromegaly. No significant cervical adenopathy. Lungs:  Clear throughout to auscultation.   No wheezes, crackles, or rhonchi. No acute distress. Heart:  Regular rate and rhythm; no murmurs, clicks, rubs,  or gallops. Abdomen: Non-distended, normal bowel sounds.  Soft and nontender without appreciable mass or hepatosplenomegaly.  Pulses:  Normal pulses noted. Extremities:  Without clubbing or edema.  Impression/Plan: 86 year old lady here for further evaluation of GERD, iron deficiency anemia, esophageal dysphagia via EGD and colonoscopy. The risks, benefits, limitations, imponderables and alternatives regarding both EGD and colonoscopy have been reviewed with the patient. Questions have been answered. All parties agreeable.  Potential for esophageal dilation is been specifically reviewed.  Questions answered.  She is agreeable.    Notice: This dictation was prepared with Dragon dictation along with smaller phrase technology. Any transcriptional errors that result from this process are unintentional and may not be corrected upon review.

## 2020-05-26 NOTE — Op Note (Addendum)
[facility] Patient Name: Kimberly Best Procedure Date: 05/26/2020 8:19 AM MRN: [id number] Date of Birth: October 22, 1933 Attending MD: Norvel Richards , MD CSN: [id number] Age: 86 Admit Type: Outpatient Procedure:                Upper GI endoscopy Indications:              Iron deficiency anemia, Dysphagia Providers:                Norvel Richards, MD, Tammy Vaught, RN, [name]                            Page, Nelma Rothman, Technician Referring MD:              Medicines:                Midazolam 2 mg IV, Meperidine 25 mg IV, Ondansetron                            4 mg IV Complications:            No immediate complications. Estimated Blood Loss:     Estimated blood loss was minimal. Procedure:                Pre-Anesthesia Assessment:                           - Prior to the procedure, a History and Physical                            was performed, and patient medications and                            allergies were reviewed. The patient's tolerance of                            previous anesthesia was also reviewed. The risks                            and benefits of the procedure and the sedation                            options and risks were discussed with the patient.                            All questions were answered, and informed consent                            was obtained. ASA Grade Assessment: II - A patient                            with mild systemic disease. After reviewing the                            risks and benefits, the patient was deemed in  satisfactory condition to undergo the procedure.                           After obtaining informed consent, the endoscope was                            passed under direct vision. Throughout the                            procedure, the patient's blood pressure, pulse, and                            oxygen saturations were monitored continuously. The                             [id number]-H190 ([id number]) scope was introduced through the                            mouth, and advanced to the second part of duodenum.                            The upper GI endoscopy was accomplished without                            difficulty. The patient tolerated the procedure                            well. Scope In: 8:40:47 AM Scope Out: 8:47:21 AM Total Procedure Duration: 0 hours 6 minutes 34 seconds  Findings:      The examined esophagus was normal.      Diffuse mild mucosal changes were found in the stomach. Patchy       submucosal petechiae throughout most of the gastric mucosa. No ulcer or       infiltrating process seen.      The duodenal bulb and second portion of the duodenum were normal.The       scope was withdrawn. Dilation was performed with a Maloney dilator with       mild resistance at 65 Fr. The dilation site was examined following       endoscope reinsertion and showed no change. Estimated blood loss was       minimal. Finally, the abnormal gastric mucosa was biopsied with a cold       forceps for histology. Estimated blood loss was minimal. Impression:               - Normal esophagus. Dilated.                           - Mucosal changes in the stomach of uncertain                            significance. Biopsied.                           - Normal duodenal bulb and second portion of the  duodenum. Moderate Sedation:      Moderate (conscious) sedation was administered by the endoscopy nurse       and supervised by the endoscopist. The following parameters were       monitored: oxygen saturation, heart rate, blood pressure, and response       to care. Total physician intraservice time was 10 minutes. Recommendation:           - Patient has a contact number available for                            emergencies. The signs and symptoms of potential                            delayed complications were discussed with the                             patient. Return to normal activities tomorrow.                            Written discharge instructions were provided to the                            patient.                           - Advance diet as tolerated.                           - Continue present medications. Continue Protonix                            40 mg daily. Follow-up on pathology. See                            colonoscopy report. Procedure Code(s):        --- Professional ---                           [phone], Esophagogastroduodenoscopy, flexible,                            transoral; with biopsy, single or multiple                           43450, Dilation of esophagus, by unguided sound or                            bougie, single or multiple passes                           G0500, Moderate sedation services provided by the                            same physician or other qualified health care  professional performing a gastrointestinal                            endoscopic service that sedation supports,                            requiring the presence of an independent trained                            observer to assist in the monitoring of the                            patient's level of consciousness and physiological                            status; initial 15 minutes of intra-service time;                            patient age 37 years or older (additional time may                            be reported with [phone], as appropriate) Diagnosis Code(s):        --- Professional ---                           K31.89, Other diseases of stomach and duodenum                           D50.9, Iron deficiency anemia, unspecified                           R13.10, Dysphagia, unspecified CPT copyright 2019 American Medical Association. All rights reserved. The codes documented in this report are preliminary and upon coder review may  be revised to meet current compliance  requirements. [name]. Jazlynn Nemetz, MD Norvel Richards, MD 05/26/2020 8:52:25 AM This report has been signed electronically. Number of Addenda: 0

## 2020-05-26 NOTE — Op Note (Signed)
[facility] Patient Name: Kimberly Best Procedure Date: 05/26/2020 8:21 AM MRN: [id number] Date of Birth: 1933/12/10 Attending MD: Norvel Richards , MD CSN: [id number] Age: 86 Admit Type: Outpatient Procedure:                Colonoscopy Indications:              Iron deficiency anemia Providers:                Norvel Richards, MD, Rosina Lowenstein, RN, [name]                            Page, Nelma Rothman, Technician Referring MD:              Medicines:                Midazolam 3 mg IV, Meperidine 25 mg IV Complications:            No immediate complications. Estimated Blood Loss:     Estimated blood loss: none. Procedure:                Pre-Anesthesia Assessment:                           - Prior to the procedure, a History and Physical                            was performed, and patient medications and                            allergies were reviewed. The patient's tolerance of                            previous anesthesia was also reviewed. The risks                            and benefits of the procedure and the sedation                            options and risks were discussed with the patient.                            All questions were answered, and informed consent                            was obtained. ASA Grade Assessment: II - A patient                            with mild systemic disease. After reviewing the                            risks and benefits, the patient was deemed in                            satisfactory condition to undergo the procedure.  After obtaining informed consent, the colonoscope                            was passed under direct vision. Throughout the                            procedure, the patient's blood pressure, pulse, and                            oxygen saturations were monitored continuously. The                            CF-HQ190L ([id number]) scope was introduced through                             the anus and advanced to the the cecum, identified                            by appendiceal orifice and ileocecal valve. The                            colonoscopy was performed without difficulty. The                            patient tolerated the procedure well. The quality                            of the bowel preparation was adequate. Scope In: 8:53:35 AM Scope Out: 9:15:04 AM Scope Withdrawal Time: 0 hours 6 minutes 18 seconds  Total Procedure Duration: 0 hours 21 minutes 29 seconds  Findings:      The perianal and digital rectal examinations were normal.      A few medium-mouthed diverticula were found in the sigmoid colon. Entire       colonic mucosa was friable. Tiny areas of mucosal bleeding with trivial       scope contact.      The exam was otherwise without abnormality on direct and retroflexion       views. Extrinsic mass-effect in the rectum presumably due to pessary.       Unable to retroflex. Rectal mucosa seen on face very well, however. Impression:               - Diverticulosis in the sigmoid colon.                           - The examination was otherwise normal on direct                            and retroflexion views. Friable colonic mucosa                            -likely the result of ASA/NSAID insult                           - No specimens collected. Moderate Sedation:  Moderate (conscious) sedation was administered by the endoscopy nurse       and supervised by the endoscopist. The following parameters were       monitored: oxygen saturation, heart rate, blood pressure, respiratory       rate, EKG, adequacy of pulmonary ventilation, and response to care.       Total physician intraservice time was 21 minutes. Recommendation:           - Patient has a contact number available for                            emergencies. The signs and symptoms of potential                            delayed complications were discussed with the                             patient. Return to normal activities tomorrow.                            Written discharge instructions were provided to the                            patient.                           - Resume previous diet.                           - Continue present medications.                           - No repeat colonoscopy due to age.                           - Return to GI clinic in 6 weeks. Minimize the use                            of ASA/NSAID products See EGD report Procedure Code(s):        --- Professional ---                           [phone], Colonoscopy, flexible; diagnostic, including                            collection of specimen(s) by brushing or washing,                            when performed (separate procedure)                           G0500, Moderate sedation services provided by the                            same physician or other qualified health care  professional performing a gastrointestinal                            endoscopic service that sedation supports,                            requiring the presence of an independent trained                            observer to assist in the monitoring of the                            patient's level of consciousness and physiological                            status; initial 15 minutes of intra-service time;                            patient age 18 years or older (additional time may                            be reported with [phone], as appropriate) Diagnosis Code(s):        --- Professional ---                           D50.9, Iron deficiency anemia, unspecified                           K57.30, Diverticulosis of large intestine without                            perforation or abscess without bleeding CPT copyright 2019 American Medical Association. All rights reserved. The codes documented in this report are preliminary and upon coder review may  be revised to meet current  compliance requirements. [name]. [name], MD Norvel Richards, MD 05/26/2020 9:35:15 AM This report has been signed electronically. Number of Addenda: 0

## 2020-05-26 NOTE — Discharge Instructions (Signed)
EGD Discharge instructions Please read the instructions outlined below and refer to this sheet in the next few weeks. These discharge instructions provide you with general information on caring for yourself after you leave the hospital. Your doctor may also give you specific instructions. While your treatment has been planned according to the most current medical practices available, unavoidable complications occasionally occur. If you have any problems or questions after discharge, please call your doctor. ACTIVITY  You may resume your regular activity but move at a slower pace for the next 24 hours.   Take frequent rest periods for the next 24 hours.   Walking will help expel (get rid of) the air and reduce the bloated feeling in your abdomen.   No driving for 24 hours (because of the anesthesia (medicine) used during the test).   You may shower.   Do not sign any important legal documents or operate any machinery for 24 hours (because of the anesthesia used during the test).  NUTRITION  Drink plenty of fluids.   You may resume your normal diet.   Begin with a light meal and progress to your normal diet.   Avoid alcoholic beverages for 24 hours or as instructed by your caregiver.  MEDICATIONS  You may resume your normal medications unless your caregiver tells you otherwise.  WHAT YOU CAN EXPECT TODAY  You may experience abdominal discomfort such as a feeling of fullness or "gas" pains.  FOLLOW-UP  Your doctor will discuss the results of your test with you.  SEEK IMMEDIATE MEDICAL ATTENTION IF ANY OF THE FOLLOWING OCCUR:  Excessive nausea (feeling sick to your stomach) and/or vomiting.   Severe abdominal pain and distention (swelling).   Trouble swallowing.   Temperature over 101 F (37.8 C).   Rectal bleeding or vomiting of blood.     Colonoscopy Discharge Instructions  Read the instructions outlined below and refer to this sheet in the next few weeks. These  discharge instructions provide you with general information on caring for yourself after you leave the hospital. Your doctor may also give you specific instructions. While your treatment has been planned according to the most current medical practices available, unavoidable complications occasionally occur. If you have any problems or questions after discharge, call Dr. Gala Romney at [phone]. ACTIVITY  You may resume your regular activity, but move at a slower pace for the next 24 hours.   Take frequent rest periods for the next 24 hours.   Walking will help get rid of the air and reduce the bloated feeling in your belly (abdomen).   No driving for 24 hours (because of the medicine (anesthesia) used during the test).    Do not sign any important legal documents or operate any machinery for 24 hours (because of the anesthesia used during the test).  NUTRITION  Drink plenty of fluids.   You may resume your normal diet as instructed by your doctor.   Begin with a light meal and progress to your normal diet. Heavy or fried foods are harder to digest and may make you feel sick to your stomach (nauseated).   Avoid alcoholic beverages for 24 hours or as instructed.  MEDICATIONS  You may resume your normal medications unless your doctor tells you otherwise.  WHAT YOU CAN EXPECT TODAY  Some feelings of bloating in the abdomen.   Passage of more gas than usual.   Spotting of blood in your stool or on the toilet paper.  IF YOU HAD POLYPS REMOVED DURING  THE COLONOSCOPY:  No aspirin products for 7 days or as instructed.   No alcohol for 7 days or as instructed.   Eat a soft diet for the next 24 hours.  FINDING OUT THE RESULTS OF YOUR TEST Not all test results are available during your visit. If your test results are not back during the visit, make an appointment with your caregiver to find out the results. Do not assume everything is normal if you have not heard from your caregiver or the  medical facility. It is important for you to follow up on all of your test results.  SEEK IMMEDIATE MEDICAL ATTENTION IF:  You have more than a spotting of blood in your stool.   Your belly is swollen (abdominal distention).   You are nauseated or vomiting.   You have a temperature over 101.   You have abdominal pain or discomfort that is severe or gets worse throughout the day.    Your esophagus was stretched today see can swallow better.  Your stomach was a little inflamed.  Biopsies taken  Colon looked good for your age.  Your entire GI tract is a little irritated probably from taking aspirin and Advil.  Probably losing his small amount of blood from the effects of Advil and aspirin irritating your intestines.  Would minimize the use of these medications.  I do not recommend a future colonoscopy  Office visit with [place] in 6 weeks  Further recommendations to follow pending review of pathology report  At patient request, I called Shanna Cisco at [phone] -

## 2020-05-27 ENCOUNTER — Other Ambulatory Visit: Payer: Self-pay

## 2020-05-27 ENCOUNTER — Telehealth (HOSPITAL_COMMUNITY): Payer: Self-pay | Admitting: Physical Therapy

## 2020-05-27 ENCOUNTER — Ambulatory Visit (HOSPITAL_COMMUNITY): Payer: Medicare Other | Admitting: Physical Therapy

## 2020-05-27 LAB — SURGICAL PATHOLOGY

## 2020-05-27 NOTE — Telephone Encounter (Signed)
Called pt re no show. Pt states that she cancelled that appointment last week as she is having a procedure completed which required her to be tested for COVID and then stay at home prior to the procedure.  Rayetta Humphrey, Jay CLT [phone]

## 2020-05-28 ENCOUNTER — Encounter (HOSPITAL_COMMUNITY): Payer: Self-pay | Admitting: Internal Medicine

## 2020-05-28 ENCOUNTER — Encounter: Payer: Self-pay | Admitting: Internal Medicine

## 2020-05-31 ENCOUNTER — Ambulatory Visit (HOSPITAL_COMMUNITY): Payer: Medicare Other | Admitting: Physical Therapy

## 2020-05-31 ENCOUNTER — Telehealth: Payer: Self-pay

## 2020-05-31 ENCOUNTER — Other Ambulatory Visit: Payer: Self-pay

## 2020-05-31 DIAGNOSIS — M6281 Muscle weakness (generalized): Secondary | ICD-10-CM | POA: Diagnosis not present

## 2020-05-31 DIAGNOSIS — R29898 Other symptoms and signs involving the musculoskeletal system: Secondary | ICD-10-CM | POA: Diagnosis not present

## 2020-05-31 DIAGNOSIS — M25512 Pain in left shoulder: Secondary | ICD-10-CM | POA: Diagnosis not present

## 2020-05-31 DIAGNOSIS — G8929 Other chronic pain: Secondary | ICD-10-CM | POA: Diagnosis not present

## 2020-05-31 DIAGNOSIS — R2689 Other abnormalities of gait and mobility: Secondary | ICD-10-CM | POA: Diagnosis not present

## 2020-05-31 NOTE — Telephone Encounter (Signed)
Tried calling pt to discuss RX needed. VM is full. Will call pt back.

## 2020-05-31 NOTE — Telephone Encounter (Signed)
OV made °

## 2020-05-31 NOTE — Therapy (Addendum)
[place], [place], [address] Phone: [phone]   Fax:  [phone]  Physical Therapy Treatment  Patient Details  Name: Kimberly Best MRN: [id number] Date of Birth: Jul 13, 1933 Referring Provider (PT): Benny Lennert   Encounter Date: 05/31/2020   PT End of Session - 05/31/20 1207    Visit Number 17    Number of Visits 20    Date for PT Re-Evaluation 06/06/20    Authorization Type [employer]    Progress Note Due on Visit 19    PT Start Time 1005    PT Stop Time 1045    PT Time Calculation (min) 40 min    Equipment Utilized During Treatment Gait belt    Activity Tolerance Patient tolerated treatment well    Behavior During Therapy [facility] for tasks assessed/performed           Past Medical History:  Diagnosis Date  . Allergy   . Anxiety   . Arthritis   . Cancer [facility])    right breast cancer  . Diabetes mellitus without complication ([place])   . GERD (gastroesophageal reflux disease)   . Hypertension   . Pessary maintenance 03/10/2015    Past Surgical History:  Procedure Laterality Date  . BIOPSY  05/26/2020   Procedure: BIOPSY;  Surgeon: Daneil Dolin, MD;  [facility];  Service: Endoscopy;;  . BREAST SURGERY Right    lumpectomy   . CHOLECYSTECTOMY    . COLONOSCOPY N/A 05/26/2020   Procedure: COLONOSCOPY;  Surgeon: Daneil Dolin, MD;  [facility];  Service: Endoscopy;  Laterality: N/A;  8:30am  . ESOPHAGOGASTRODUODENOSCOPY N/A 05/26/2020   Procedure: ESOPHAGOGASTRODUODENOSCOPY (EGD);  Surgeon: Daneil Dolin, MD;  [facility];  Service: Endoscopy;  Laterality: N/A;  . EYE SURGERY      There were no vitals filed for this visit.   Subjective Assessment - 05/31/20 1019    Subjective Pt states her back hurts all the time.  The Lt hip still hurts as well.    Currently in Pain? Yes    Pain Score 8                                    Balance Exercises - 05/31/20 0001      Balance Exercises: Standing   SLS with Vectors 5 reps;Limitations   with 3" holds each way and 1 HHA   Tandem Gait Forward;Foam/compliant surface;2 reps    Partial Tandem Stance Eyes open;Other reps (comment)   2# dowel UE flexion 10 reps each, protraction/retracion 10X   Sidestepping Foam/compliant support;2 reps    Marching Solid surface;Static;15 reps               PT Short Term Goals - 04/22/20 0955      PT SHORT TERM GOAL #1   Title PT core and LE strength  to  increase to allow pt to be able to come from sit to stand with one UE assist    Time 3    Period Weeks    Status Achieved    Target Date 04/22/20      PT SHORT TERM GOAL #2   Title PT to be able to ambulate 120 ft with rolling walker in a 3 minute period of time to show progression of being a household ambulator.    Time 3  Period Weeks    Status Achieved             PT Long Term Goals - 04/22/20 0955      PT LONG TERM GOAL #1   Title Pt core and LE strength to increase to allow pt to be able to come sit to stand without UE assist    Time 6    Period Weeks    Status Achieved      PT LONG TERM GOAL #2   Title PT to be able to stand for 10 minutes with pain no greater than a 3/10 to be able to complete self grooming activities.    Time 6    Period Weeks    Status Achieved      PT LONG TERM GOAL #3   Title PT to be able to ambulate 226 ft with rolling walker in a 3' period time to be able to walk safely in her home.    Time 6    Period Weeks    Status Achieved      PT LONG TERM GOAL #4   Title PT to be able to single leg stance for 10 seconds to reduce risk of falling    Time 6    Period Weeks    Status On-going      PT LONG TERM GOAL #5   Title Pt core and balance to be increased to allow pt to be able to vacuum by herself    Time 3    Period Weeks    Status New    Target Date 05/13/20                 Plan - 05/31/20 1156    Clinical  Impression Statement Focused most of session on balance activities.  Pt able to complete with cues for posturing and hold times.  Pt takes extended time with side steps on balance beam as she takes small steps and "hops" her Rt LE over secondary to decreased WB time through [place].  Cues to correct this with several LOB going forward.  Added stance with cues to hold each direction and remain upright.  Pt only required 2 short seated rest breaks during session today.    Personal Factors and Comorbidities Age;Fitness;Time since onset of injury/illness/exacerbation    Examination-Activity Limitations Stand;Stairs;Squat;Sit;Carry;Transfers;Locomotion Level    Examination-Participation Restrictions Cleaning;Meal Prep;Other    Stability/Clinical Decision Making Evolving/Moderate complexity    Rehab Potential Good    PT Frequency 2x / week    PT Duration 6 weeks    PT Treatment/Interventions DME Instruction;Gait training;Stair training;Functional mobility training;Therapeutic activities;Therapeutic exercise;Balance training;Patient/family education;Manual techniques    PT Next Visit Plan continue higher balance training with foam, reduce HHA with challenges    PT Home Exercise Plan given at evaluation:  LAQ, ankle pumps in sitting, Sit to stand; 4/29:  bridge, sidelying abductiona and prone SLR; 5/13:  heel raises; functional squats, bed mobility and sit to stand           Patient will benefit from skilled therapeutic intervention in order to improve the following deficits and impairments:  Abnormal gait, Decreased activity tolerance, Decreased balance, Decreased mobility, Decreased range of motion, Difficulty walking, Decreased strength, Pain  Visit Diagnosis: Muscle weakness (generalized)  Other abnormalities of gait and mobility     Problem List Patient Active Problem List   Diagnosis Date Noted  . Anemia 04/14/2020  . Pessary maintenance 04/14/2020  . GERD (gastroesophageal reflux disease)  01/14/2020  . Osteoarthritis of right shoulder 01/14/2020  . Ear pain, bilateral 12/17/2019  . Osteoporosis 09/16/2019  . Controlled type 2 diabetes mellitus without complication, without long-term current use of insulin ([place]) 09/16/2019  . Hyperlipidemia 09/16/2019  . Problem with vaginal pessary ([place]) 09/03/2018  . Fitting and adjustment of pessary 03/10/2015  . Uterovaginal prolapse, complete 02/18/2015  . Complete uterovaginal prolapse 02/01/2015  . Female stress incontinence 06/10/2014  . Urge incontinence 06/10/2014   Teena Irani, PTA/CLT [phone]  Teena Irani 05/31/2020, 12:08 PM  [place], [place], [address] Phone: [phone]   Fax:  [phone]  Name: Kimberly Best MRN: [id number] Date of Birth: 01-Apr-1933

## 2020-05-31 NOTE — Telephone Encounter (Signed)
Per RMR- Needs treatment for H. pylori. Pylera 3 capsules p.o. 4 times daily x14 days. Protonix 40 mg twice daily during Pylera treatment.   OV with [facility] in 3 months if not already scheduled

## 2020-06-01 ENCOUNTER — Other Ambulatory Visit: Payer: Self-pay

## 2020-06-01 MED ORDER — PANTOPRAZOLE SODIUM 40 MG PO TBEC
40.0000 mg | DELAYED_RELEASE_TABLET | Freq: Two times a day (BID) | ORAL | 0 refills | Status: DC
Start: 2020-06-01 — End: 2020-07-14

## 2020-06-01 MED ORDER — PYLERA 140-125-125 MG PO CAPS
3.0000 | ORAL_CAPSULE | Freq: Three times a day (TID) | ORAL | 0 refills | Status: DC
Start: 2020-06-01 — End: 2020-06-04

## 2020-06-01 NOTE — Telephone Encounter (Signed)
She will need to fill out patient assistance forms for pylera.

## 2020-06-01 NOTE — Telephone Encounter (Signed)
I like Julie's recommendations the best

## 2020-06-01 NOTE — Telephone Encounter (Signed)
Spoke with pt. Letter was mailed. Pt was notified of results and medications needed to treat H Pylori. Pt is aware that she should taking Pantoprazole 40 mg bid while taking pylera.

## 2020-06-01 NOTE — Telephone Encounter (Signed)
Received a call from [facility]. Pylera isn't covered under pts insurance. Tetracycline 125mg  isn't available in the and Tetracycline 250 mg can be ordered. Metronidazole 150 mg can't be ordered and Metronidazole 250 mg can be ordered. They are asking for instructions/direction on medications to treat pt. Pt isn't able to take Pylera due to her PCN allergy. Please advise.

## 2020-06-01 NOTE — Telephone Encounter (Signed)
Noted. Spoke with pt. Pt is aware that she will received pt assistance forms in the mail to help cover her medication. Called pts pharmacy and they are aware.

## 2020-06-02 ENCOUNTER — Other Ambulatory Visit: Payer: Self-pay

## 2020-06-02 ENCOUNTER — Ambulatory Visit (HOSPITAL_COMMUNITY): Payer: Medicare Other | Admitting: Physical Therapy

## 2020-06-02 DIAGNOSIS — R29898 Other symptoms and signs involving the musculoskeletal system: Secondary | ICD-10-CM | POA: Diagnosis not present

## 2020-06-02 DIAGNOSIS — M25512 Pain in left shoulder: Secondary | ICD-10-CM | POA: Diagnosis not present

## 2020-06-02 DIAGNOSIS — G8929 Other chronic pain: Secondary | ICD-10-CM | POA: Diagnosis not present

## 2020-06-02 DIAGNOSIS — R2689 Other abnormalities of gait and mobility: Secondary | ICD-10-CM

## 2020-06-02 DIAGNOSIS — M6281 Muscle weakness (generalized): Secondary | ICD-10-CM

## 2020-06-02 NOTE — Therapy (Signed)
[place], [place], [address] Phone: [phone]   Fax:  [phone]  Physical Therapy Treatment  Patient Details  Name: Kimberly Best MRN: [id number] Date of Birth: Nov 08, 1933 Referring Provider (PT): Benny Lennert   Encounter Date: 06/02/2020   PT End of Session - 06/02/20 1157    Visit Number 18    Number of Visits 20    Date for PT Re-Evaluation 06/06/20    Authorization Type [employer]    Progress Note Due on Visit 19    PT Start Time 1045    PT Stop Time 1130    PT Time Calculation (min) 45 min    Equipment Utilized During Treatment Gait belt    Activity Tolerance Patient tolerated treatment well    Behavior During Therapy [facility] for tasks assessed/performed           Past Medical History:  Diagnosis Date  . Allergy   . Anxiety   . Arthritis   . Cancer [facility])    right breast cancer  . Diabetes mellitus without complication ([place])   . GERD (gastroesophageal reflux disease)   . Hypertension   . Pessary maintenance 03/10/2015    Past Surgical History:  Procedure Laterality Date  . BIOPSY  05/26/2020   Procedure: BIOPSY;  Surgeon: Daneil Dolin, MD;  [facility];  Service: Endoscopy;;  . BREAST SURGERY Right    lumpectomy   . CHOLECYSTECTOMY    . COLONOSCOPY N/A 05/26/2020   Procedure: COLONOSCOPY;  Surgeon: Daneil Dolin, MD;  [facility];  Service: Endoscopy;  Laterality: N/A;  8:30am  . ESOPHAGOGASTRODUODENOSCOPY N/A 05/26/2020   Procedure: ESOPHAGOGASTRODUODENOSCOPY (EGD);  Surgeon: Daneil Dolin, MD;  [facility];  Service: Endoscopy;  Laterality: N/A;  . EYE SURGERY      There were no vitals filed for this visit.   Subjective Assessment - 06/02/20 1054    Subjective pt states she almost cancelled today due to stomach issues.  Reports no back pain today.    Currently in Pain? No/denies                                   Balance Exercises - 06/02/20 0001      Balance Exercises: Standing   Tandem Stance Foam/compliant surface;Limitations    Tandem Stance Time 1#UE flwxiom 15 reps, UE pro/retract 10 reps each    SLS with Vectors 5 reps;Limitations    Step Ups 4 inch;UE support 1;Lateral;Forward;Limitations    Step Ups Limitations 10 reps each with 1 HHA    Sidestepping Foam/compliant support;2 reps    Marching Solid surface;Static;20 reps    Sit to Stand Standard surface;Without upper extremity support;Limitations    Sit to Stand Limitations 15 reps               PT Short Term Goals - 04/22/20 0955      PT SHORT TERM GOAL #1   Title PT core and LE strength  to  increase to allow pt to be able to come from sit to stand with one UE assist    Time 3    Period Weeks    Status Achieved    Target Date 04/22/20      PT SHORT TERM GOAL #2   Title PT to be able to ambulate 120 ft with rolling walker in a  3 minute period of time to show progression of being a household ambulator.    Time 3    Period Weeks    Status Achieved             PT Long Term Goals - 04/22/20 0955      PT LONG TERM GOAL #1   Title Pt core and LE strength to increase to allow pt to be able to come sit to stand without UE assist    Time 6    Period Weeks    Status Achieved      PT LONG TERM GOAL #2   Title PT to be able to stand for 10 minutes with pain no greater than a 3/10 to be able to complete self grooming activities.    Time 6    Period Weeks    Status Achieved      PT LONG TERM GOAL #3   Title PT to be able to ambulate 226 ft with rolling walker in a 3' period time to be able to walk safely in her home.    Time 6    Period Weeks    Status Achieved      PT LONG TERM GOAL #4   Title PT to be able to single leg stance for 10 seconds to reduce risk of falling    Time 6    Period Weeks    Status On-going      PT LONG TERM GOAL #5   Title Pt core and balance to be increased to allow pt to be able to vacuum  by herself    Time 3    Period Weeks    Status New    Target Date 05/13/20                 Plan - 06/02/20 1158    Clinical Impression Statement Continued focus on LE strengthening and stability.  Added forward and lateral step ups with 1 UE with good control noted.  No LOB today with tandem UE flexion/retractions, however continues to require cues for sequencing of protraction/retraction with dowel.   Pt required 2 seated rest breaks during session today and no c/o stomach pain worsening during session.    Personal Factors and Comorbidities Age;Fitness;Time since onset of injury/illness/exacerbation    Examination-Activity Limitations Stand;Stairs;Squat;Sit;Carry;Transfers;Locomotion Level    Examination-Participation Restrictions Cleaning;Meal Prep;Other    Stability/Clinical Decision Making Evolving/Moderate complexity    Rehab Potential Good    PT Frequency 2x / week    PT Duration 6 weeks    PT Treatment/Interventions DME Instruction;Gait training;Stair training;Functional mobility training;Therapeutic activities;Therapeutic exercise;Balance training;Patient/family education;Manual techniques    PT Next Visit Plan continue higher balance training with foam, reduce HHA with challenges    PT Home Exercise Plan given at evaluation:  LAQ, ankle pumps in sitting, Sit to stand; 4/29:  bridge, sidelying abductiona and prone SLR; 5/13:  heel raises; functional squats, bed mobility and sit to stand           Patient will benefit from skilled therapeutic intervention in order to improve the following deficits and impairments:  Abnormal gait, Decreased activity tolerance, Decreased balance, Decreased mobility, Decreased range of motion, Difficulty walking, Decreased strength, Pain  Visit Diagnosis: Muscle weakness (generalized)  Other abnormalities of gait and mobility  Chronic left shoulder pain  Other symptoms and signs involving the musculoskeletal system     Problem  List Patient Active Problem List   Diagnosis Date Noted  . Anemia  04/14/2020  . Pessary maintenance 04/14/2020  . GERD (gastroesophageal reflux disease) 01/14/2020  . Osteoarthritis of right shoulder 01/14/2020  . Ear pain, bilateral 12/17/2019  . Osteoporosis 09/16/2019  . Controlled type 2 diabetes mellitus without complication, without long-term current use of insulin ([place]) 09/16/2019  . Hyperlipidemia 09/16/2019  . Problem with vaginal pessary ([place]) 09/03/2018  . Fitting and adjustment of pessary 03/10/2015  . Uterovaginal prolapse, complete 02/18/2015  . Complete uterovaginal prolapse 02/01/2015  . Female stress incontinence 06/10/2014  . Urge incontinence 06/10/2014   Teena Irani, PTA/CLT [phone]  Teena Irani 06/02/2020, 11:59 AM  [place], [place], [address] Phone: [phone]   Fax:  [phone]  Name: Kimberly Best MRN: [id number] Date of Birth: Mar 12, 1933

## 2020-06-04 ENCOUNTER — Encounter (INDEPENDENT_AMBULATORY_CARE_PROVIDER_SITE_OTHER): Payer: Self-pay | Admitting: Nurse Practitioner

## 2020-06-04 ENCOUNTER — Other Ambulatory Visit: Payer: Self-pay

## 2020-06-04 ENCOUNTER — Telehealth (INDEPENDENT_AMBULATORY_CARE_PROVIDER_SITE_OTHER): Payer: Medicare Other | Admitting: Nurse Practitioner

## 2020-06-04 ENCOUNTER — Telehealth (INDEPENDENT_AMBULATORY_CARE_PROVIDER_SITE_OTHER): Payer: Self-pay | Admitting: Nurse Practitioner

## 2020-06-04 VITALS — Ht <= 58 in | Wt 166.0 lb

## 2020-06-04 DIAGNOSIS — Z13820 Encounter for screening for osteoporosis: Secondary | ICD-10-CM | POA: Diagnosis not present

## 2020-06-04 DIAGNOSIS — Z1159 Encounter for screening for other viral diseases: Secondary | ICD-10-CM

## 2020-06-04 DIAGNOSIS — Z0000 Encounter for general adult medical examination without abnormal findings: Secondary | ICD-10-CM | POA: Diagnosis not present

## 2020-06-04 NOTE — Progress Notes (Signed)
Due to national recommendations of social distancing related to the [place] pandemic, an audio/visual tele-health visit was felt to be the most appropriate encounter type for this patient today. I connected with  Kimberly Best on 06/04/20 utilizing audio-only technology and verified that I am speaking with the correct person using two identifiers. The patient was located at their home, and I was located at home during the encounter. I discussed the limitations of evaluation and management by telemedicine. The patient expressed understanding and agreed to proceed.  She did not have access to video technology so the visit was completed via telephone only.    Subjective:   Kimberly Best is a 86 y.o. female who presents for Medicare Annual (Subsequent) preventive examination.  Review of Systems     Cardiac Risk Factors include: advanced age (>57men, >14 women);diabetes mellitus;hypertension;obesity (BMI >30kg/m2)     Objective:    Today's Vitals   06/04/20 0802 06/04/20 0803  Weight: 166 lb (75.3 kg)   Height: 4\' 10"  (1.473 m)   PainSc:  5    Body mass index is 34.69 kg/m.  Advanced Directives 06/04/2020 05/26/2020 04/01/2020 04/01/2020 09/03/2018 08/30/2018 10/15/2015  Does Patient Have a Medical Advance Directive? No No No No No No No  Would patient like information on creating a medical advance directive? No - Patient declined No - Patient declined No - Patient declined No - Patient declined No - Patient declined No - Patient declined No - patient declined information    Current Medications (verified) Outpatient Encounter Medications as of 06/04/2020  Medication Sig  . acetaminophen (TYLENOL) 650 MG CR tablet Take 650 mg by mouth 2 (two) times daily as needed for pain.  Marland Kitchen aspirin EC 81 MG tablet Take 243 mg by mouth daily.   . cholecalciferol (VITAMIN D) 1000 UNITS tablet Take 1,000 Units by mouth daily.  . fluticasone (FLONASE) 50 MCG/ACT nasal spray Place 2 sprays into both nostrils  daily as needed for allergies or rhinitis.  Marland Kitchen lisinopril (ZESTRIL) 2.5 MG tablet Take 1 tablet (2.5 mg total) by mouth daily.  . metFORMIN (GLUCOPHAGE) 500 MG tablet Take one with largest meal (Patient taking differently: Take 500 mg by mouth daily with supper. )  . Multiple Vitamins-Minerals (IMMUNE SUPPORT PO) Take 1 tablet by mouth daily.  . pantoprazole (PROTONIX) 40 MG tablet Take 1 tablet (40 mg total) by mouth 2 (two) times daily for 14 days.  Marland Kitchen SLOW FE 142 (45 Fe) MG TBCR Take 142 mg by mouth daily.   . pantoprazole (PROTONIX) 40 MG tablet Take 1 tablet (40 mg total) by mouth daily before breakfast. (Patient not taking: Reported on 06/04/2020)  . [DISCONTINUED] bismuth-metronidazole-tetracycline (PYLERA) 140-125-125 MG capsule Take 3 capsules by mouth 4 (four) times daily -  before meals and at bedtime. (Patient not taking: Reported on 06/04/2020)  . [DISCONTINUED] miconazole (MONISTAT 7 SIMPLY CURE) 2 % vaginal cream Place 1 Applicatorful vaginally at bedtime.  . [DISCONTINUED] miconazole (MONISTAT 7) 2 % vaginal cream 1 Applicatorful    No facility-administered encounter medications on file as of 06/04/2020.    Allergies (verified) Penicillins   History: Past Medical History:  Diagnosis Date  . Allergy   . Anxiety   . Arthritis   . Cancer [facility])    right breast cancer  . Diabetes mellitus without complication ([place])   . GERD (gastroesophageal reflux disease)   . Hypertension   . Pessary maintenance 03/10/2015   Past Surgical History:  Procedure Laterality Date  .  BIOPSY  05/26/2020   Procedure: BIOPSY;  Surgeon: Daneil Dolin, MD;  [facility];  Service: Endoscopy;;  . BREAST SURGERY Right    lumpectomy   . CHOLECYSTECTOMY    . COLONOSCOPY N/A 05/26/2020   Procedure: COLONOSCOPY;  Surgeon: Daneil Dolin, MD;  Location[facility];  Service: Endoscopy;  Laterality: N/A;  8:30am  . ESOPHAGOGASTRODUODENOSCOPY N/A 05/26/2020   Procedure:  ESOPHAGOGASTRODUODENOSCOPY (EGD);  Surgeon: Daneil Dolin, MD;  Location: [facility];  Service: Endoscopy;  Laterality: N/A;  . EYE SURGERY     Family History  Problem Relation Age of Onset  . Cancer Father   . Stroke Mother   . Hypertension Mother   . Diabetes Sister   . Hypertension Sister   . Cancer Brother   . Hypertension Brother   . Bipolar disorder Daughter   . Hypertension Maternal Grandmother   . Tuberculosis Paternal Grandmother   . Hypertension Paternal Grandmother   . Alcohol abuse Paternal Grandfather   . Hypertension Paternal Grandfather   . Colon cancer Neg Hx    Social History   Socioeconomic History  . Marital status: Married    Spouse name: Not on file  . Number of children: Not on file  . Years of education: Not on file  . Highest education level: Not on file  Occupational History  . Not on file  Tobacco Use  . Smoking status: Never Smoker  . Smokeless tobacco: Never Used  Vaping Use  . Vaping Use: Never used  Substance and Sexual Activity  . Alcohol use: No  . Drug use: No  . Sexual activity: Not Currently    Birth control/protection: Post-menopausal  Other Topics Concern  . Not on file  Social History Narrative  . Not on file   Social Determinants of Health   Financial Resource Strain:   . Difficulty of Paying Living Expenses:   Food Insecurity:   . Worried About Charity fundraiser in the Last Year:   . Arboriculturist in the Last Year:   Transportation Needs:   . Film/video editor (Medical):   Marland Kitchen Lack of Transportation (Non-Medical):   Physical Activity:   . Days of Exercise per Week:   . Minutes of Exercise per Session:   Stress:   . Feeling of Stress :   Social Connections:   . Frequency of Communication with Friends and Family:   . Frequency of Social Gatherings with Friends and Family:   . Attends Religious Services:   . Active Member of Clubs or Organizations:   . Attends Archivist Meetings:   Marland Kitchen Marital  Status:     Tobacco Counseling Counseling given: Not Answered   Clinical Intake:  Pre-visit preparation completed: Yes  Pain : 0-10 Pain Score: 5  Pain Type: Chronic pain Pain Location: Hip Pain Orientation: Left Pain Descriptors / Indicators: Aching Pain Onset: More than a month ago Pain Frequency: Intermittent     BMI - recorded: 34.69 Nutritional Status: BMI > 30  Obese Nutritional Risks: None Diabetes: Yes CBG done?: Yes CBG resulted in Enter/ Edit results?: No (113 on home device) Did pt. bring in CBG monitor from home?: No  How often do you need to have someone help you when you read instructions, pamphlets, or other written materials from your doctor or pharmacy?: 4 - Often What is the last grade level you completed in school?: 12th grade  Diabetic? Yes  Interpreter Needed?: No  Information entered by :: Kimberly Ruths, NP-C   Activities of Daily Living In your present state of health, do you have any difficulty performing the following activities: 06/04/2020 06/04/2020  Hearing? Y N  Comment at times -  Vision? N -  Difficulty concentrating or making decisions? Y -  Comment at times -  Walking or climbing stairs? Y -  Dressing or bathing? Y -  Comment at times -  Doing errands, shopping? Y -  Comment at times -  [employer] and eating ? N -  Using the Toilet? N -  In the past six months, have you accidently leaked urine? Y -  Do you have problems with loss of bowel control? N -  Managing your Medications? N -  Managing your Finances? N -  Housekeeping or managing your Housekeeping? Y -  Some recent data might be hidden    Patient Care Team: [name], NP as PCP - General (Nurse Practitioner)  Indicate any recent Medical Services you may have received from other than [facility] providers in the past year (date may be approximate).     Assessment:   This is a routine wellness examination for Giamarie.  Hearing/Vision screen No exam data  present  Dietary issues and exercise activities discussed: Current Exercise Habits: The patient does not participate in regular exercise at present, Exercise limited by: orthopedic condition(s)  Goals    . Prevent falls      Depression Screen PHQ 2/9 Scores 06/04/2020 05/05/2020 03/22/2020 12/17/2019 10/15/2015 06/28/2015 11/18/2014  PHQ - 2 Score 0 0 0 0 0 0 1  Exception Documentation - Medical reason - - - - -    Fall Risk Fall Risk  06/04/2020 05/05/2020 03/22/2020 12/17/2019 09/16/2019  Falls in the past year? 1 0 0 0 1  Number falls in past yr: 0 0 0 0 1  Injury with Fall? 0 0 0 0 0  Risk for fall due to : History of fall(s) No Fall Risks - - Impaired balance/gait;History of fall(s)  Follow up Falls evaluation completed;Education provided;Falls prevention discussed Falls evaluation completed Falls evaluation completed - -    Any stairs in or around the home? Yes  If so, are there any without handrails? No  Home free of loose throw rugs in walkways, pet beds, electrical cords, etc? No  Adequate lighting in your home to reduce risk of falls? No   ASSISTIVE DEVICES UTILIZED TO PREVENT FALLS:  Life alert? No  Use of a cane, walker or w/c? Yes  Grab bars in the bathroom? Yes  Shower chair or bench in shower? Yes  Elevated toilet seat or a handicapped toilet? Yes   TIMED UP AND GO:  Was the test performed? No .  Length of time to ambulate 10 feet: N/A   Cognitive Function:     6CIT Screen 06/04/2020  What Year? 0 points  What month? 0 points  What time? 0 points  Count back from 20 2 points  Months in reverse 0 points  Repeat phrase 8 points  Total Score 10    Immunizations Immunization History  Administered Date(s) Administered  . Influenza-Unspecified 09/27/2016, 09/30/2018, 08/21/2019  . Pneumococcal Conjugate-13 08/18/2014  . Pneumococcal Polysaccharide-23 08/24/2015    TDAP status: Due, Education has been provided regarding the importance of this vaccine. Advised  may receive this vaccine at local pharmacy or Health Dept. Aware to provide a copy of the vaccination record if obtained from local pharmacy or Health Dept. Verbalized  acceptance and understanding. Flu Vaccine status: Up to date Pneumococcal vaccine status: Up to date Covid-19 vaccine status: Information provided on how to obtain vaccines.   Qualifies for Shingles Vaccine? Yes   Zostavax completed No   Shingrix Completed?: No.    Education has been provided regarding the importance of this vaccine. Patient has been advised to call insurance company to determine out of pocket expense if they have not yet received this vaccine. Advised may also receive vaccine at local pharmacy or Health Dept. Verbalized acceptance and understanding.  Screening Tests Health Maintenance  Topic Date Due  . COVID-19 Vaccine (1) Never done  . TETANUS/TDAP  Never done  . DEXA SCAN  Never done  . OPHTHALMOLOGY EXAM  06/08/2020  . INFLUENZA VACCINE  07/11/2020  . FOOT EXAM  09/15/2020  . HEMOGLOBIN A1C  09/21/2020  . PNA vac Low Risk Adult  Completed    Health Maintenance  Health Maintenance Due  Topic Date Due  . COVID-19 Vaccine (1) Never done  . TETANUS/TDAP  Never done  . DEXA SCAN  Never done    Colorectal cancer screening: No longer required.  Mammogram status: No longer required.  Bone Density status: Ordered 06/04/2020. Pt provided with contact info and advised to call to schedule appt.  Lung Cancer Screening: (Low Dose CT Chest recommended if Age 63-80 years, 30 pack-year currently smoking OR have quit w/in 15years.) does not qualify.   Lung Cancer Screening Referral: n/a  Additional Screening:  Hepatitis C Screening: does qualify; ordered .06/04/20   Vision Screening: Recommended annual ophthalmology exams for early detection of glaucoma and other disorders of the eye. Is the patient up to date with their annual eye exam?  Yes  Who is the provider or what is the name of the office in  which the patient attends annual eye exams? Dr. Schuyler Amor If pt is not established with a provider, would they like to be referred to a provider to establish care? No .   Dental Screening: Recommended annual dental exams for proper oral hygiene  Community Resource Referral / Chronic Care Management: CRR required this visit?  No   CCM required this visit?  No      Plan:   She did express wanting to have a home health aide assist her with her ADLs.  She will be back in my office in approximately 2 weeks for a face-to-face exam, at which time I may order home health services.  I did encourage her to get tetanus, shingles, and COVID-19 vaccines she will consider this.  She would like to have bone density scan ordered so I will order this today.  We did briefly discuss advance care planning and I encouraged her to let me know if she would like to discuss this in further detail at her next office visit.  She will consider this.  I have personally reviewed and noted the following in the patient's chart:   . Medical and social history . Use of alcohol, tobacco or illicit drugs  . Current medications and supplements . Functional ability and status . Nutritional status . Physical activity . Advanced directives . List of other physicians . Hospitalizations, surgeries, and ER visits in previous 12 months . Vitals . Screenings to include cognitive, depression, and falls . Referrals and appointments  In addition, I have reviewed and discussed with patient certain preventive protocols, quality metrics, and best practice recommendations. A written personalized care plan for preventive services as well as general preventive  health recommendations were provided to patient.    She will follow-up as scheduled in approximately 2 weeks for her annual physical exam.  I did spend 33 minutes on the telephone with this patient today for her visit. [name], NP   06/04/2020

## 2020-06-04 NOTE — Telephone Encounter (Signed)
Please print the AVS from patient's appointment on 06/04/20 and mail to her address. Thank you!

## 2020-06-04 NOTE — Patient Instructions (Signed)
  Kimberly Best , Thank you for taking time to come for your Medicare Wellness Visit. I appreciate your ongoing commitment to your health goals. Please review the following plan we discussed and let me know if I can assist you in the future.   These are the goals we discussed: Goals    . Prevent falls       This is a list of the screening recommended for you and due dates:  Health Maintenance  Topic Date Due  . COVID-19 Vaccine (1) Never done  . Tetanus Vaccine  Never done  . DEXA scan (bone density measurement)  Never done  . Eye exam for diabetics  06/08/2020  . Flu Shot  07/11/2020  . Complete foot exam   09/15/2020  . Hemoglobin A1C  09/21/2020  . Pneumonia vaccines  Completed

## 2020-06-04 NOTE — Addendum Note (Signed)
Addended by: [name] on: 06/04/2020 08:55 AM   Modules accepted: Orders

## 2020-06-07 NOTE — Telephone Encounter (Signed)
Done

## 2020-06-08 DIAGNOSIS — Z961 Presence of intraocular lens: Secondary | ICD-10-CM | POA: Diagnosis not present

## 2020-06-08 DIAGNOSIS — H0102A Squamous blepharitis right eye, upper and lower eyelids: Secondary | ICD-10-CM | POA: Diagnosis not present

## 2020-06-08 DIAGNOSIS — H0102B Squamous blepharitis left eye, upper and lower eyelids: Secondary | ICD-10-CM | POA: Diagnosis not present

## 2020-06-08 DIAGNOSIS — E119 Type 2 diabetes mellitus without complications: Secondary | ICD-10-CM | POA: Diagnosis not present

## 2020-06-08 DIAGNOSIS — H1045 Other chronic allergic conjunctivitis: Secondary | ICD-10-CM | POA: Diagnosis not present

## 2020-06-16 ENCOUNTER — Encounter (INDEPENDENT_AMBULATORY_CARE_PROVIDER_SITE_OTHER): Payer: Self-pay | Admitting: Nurse Practitioner

## 2020-06-16 ENCOUNTER — Other Ambulatory Visit: Payer: Self-pay

## 2020-06-16 ENCOUNTER — Ambulatory Visit (INDEPENDENT_AMBULATORY_CARE_PROVIDER_SITE_OTHER): Payer: Medicare Other | Admitting: Nurse Practitioner

## 2020-06-16 ENCOUNTER — Ambulatory Visit: Payer: Medicare Other | Admitting: Family Medicine

## 2020-06-16 VITALS — BP 120/85 | HR 87 | Temp 95.9°F | Ht <= 58 in | Wt 167.0 lb

## 2020-06-16 DIAGNOSIS — D509 Iron deficiency anemia, unspecified: Secondary | ICD-10-CM

## 2020-06-16 DIAGNOSIS — R531 Weakness: Secondary | ICD-10-CM | POA: Diagnosis not present

## 2020-06-16 DIAGNOSIS — Z0001 Encounter for general adult medical examination with abnormal findings: Secondary | ICD-10-CM

## 2020-06-16 DIAGNOSIS — Z23 Encounter for immunization: Secondary | ICD-10-CM | POA: Diagnosis not present

## 2020-06-16 LAB — CBC WITH DIFFERENTIAL/PLATELET
Absolute Monocytes: 460 cells/uL (ref 200–950)
Basophils Absolute: 9 cells/uL (ref 0–200)
Basophils Relative: 0.2 %
Eosinophils Absolute: 143 cells/uL (ref 15–500)
Eosinophils Relative: 3.1 %
HCT: 31.7 % — ABNORMAL LOW (ref 35.0–45.0)
Hemoglobin: 10 g/dL — ABNORMAL LOW (ref 11.7–15.5)
Lymphs Abs: 1546 cells/uL (ref 850–3900)
MCH: 27.4 pg (ref 27.0–33.0)
MCHC: 31.5 g/dL — ABNORMAL LOW (ref 32.0–36.0)
MCV: 86.8 fL (ref 80.0–100.0)
MPV: 9.8 fL (ref 7.5–12.5)
Monocytes Relative: 10 %
Neutro Abs: 2443 cells/uL (ref 1500–7800)
Neutrophils Relative %: 53.1 %
Platelets: 292 10*3/uL (ref 140–400)
RBC: 3.65 10*6/uL — ABNORMAL LOW (ref 3.80–5.10)
RDW: 14.5 % (ref 11.0–15.0)
Total Lymphocyte: 33.6 %
WBC: 4.6 10*3/uL (ref 3.8–10.8)

## 2020-06-16 LAB — IRON,TIBC AND FERRITIN PANEL
%SAT: 19 % (calc) (ref 16–45)
Ferritin: 29 ng/mL (ref 16–288)
Iron: 56 ug/dL (ref 45–160)
TIBC: 297 mcg/dL (calc) (ref 250–450)

## 2020-06-16 NOTE — Progress Notes (Signed)
Subjective:  Patient ID: Kimberly Best, female    DOB: 02-May-1933  Age: 86 y.o. MRN: [id number]  CC:  Chief Complaint  Patient presents with  . Annual Exam      HPI  This patient arrives today for annual physical exam.  She has an 86 year old female with past medical history significant for allergies, anxiety, arthritis, right breast cancer, diabetes, GERD, hypertension, iron deficiency anemia, and was recently diagnosed with H. pylori infection.  She is being evaluated and managed by her gastroenterologist for this.  She had a telephone annual Medicare wellness visit a couple weeks ago.  At that time she had expressed to me that she feels she needs assistance with her ADLs, and part of her visit today we have agreed to her face-to-face to determine her possible needs for at home health services.  She does live alone.  She tells me that she has a difficult time bathing and dressing on her own.  She is able to feed herself.  She is able to ambulate independently with her cane.  She would like to have a home health aide assist her with bathing and dressing for a couple of hours every day if possible.  As for health maintenance she is due for tetanus, shingles, and COVID-19 vaccinations.  She would like to get the tetanus shot administered today if possible.  She would be due for sexual transmitted infection screening but she would prefer not to undergo this today.  She also is due for osteoporosis screening via DEXA scan.  I did order this in the past, however for some reason this has not been scheduled.  She underwent depression screening and fall screening during the annual wellness visit.  Per chart review I do see that she is also been found to be anemic in the recent past.  She is on an iron supplement for this.  She has been evaluated by gastroenterology in the recent past and she did undergo colonoscopy as well as esophagogastroduodenoscopy.  From what I can tell only abnormality that  was noted she is positive for H. pylori infection, and her GI doctor believes this could be playing a role in her anemia.  She has no acute complaints today.   Past Medical History:  Diagnosis Date  . Allergy   . Anxiety   . Arthritis   . Cancer [facility])    right breast cancer  . Diabetes mellitus without complication ([place])   . GERD (gastroesophageal reflux disease)   . Hypertension   . Pessary maintenance 03/10/2015      Family History  Problem Relation Age of Onset  . Cancer Father   . Stroke Mother   . Hypertension Mother   . Diabetes Sister   . Hypertension Sister   . Cancer Brother   . Hypertension Brother   . Bipolar disorder Daughter   . Hypertension Maternal Grandmother   . Tuberculosis Paternal Grandmother   . Hypertension Paternal Grandmother   . Alcohol abuse Paternal Grandfather   . Hypertension Paternal Grandfather   . Colon cancer Neg Hx     Social History   Social History Narrative  . Not on file   Social History   Tobacco Use  . Smoking status: Never Smoker  . Smokeless tobacco: Never Used  Substance Use Topics  . Alcohol use: No     Current Meds  Medication Sig  . acetaminophen (TYLENOL) 650 MG CR tablet Take 650 mg by mouth 2 (two)  times daily as needed for pain.  Marland Kitchen aspirin EC 81 MG tablet Take 243 mg by mouth daily.   . cholecalciferol (VITAMIN D) 1000 UNITS tablet Take 1,000 Units by mouth daily.  . fluticasone (FLONASE) 50 MCG/ACT nasal spray Place 2 sprays into both nostrils daily as needed for allergies or rhinitis.  Marland Kitchen lisinopril (ZESTRIL) 2.5 MG tablet Take 1 tablet (2.5 mg total) by mouth daily.  . metFORMIN (GLUCOPHAGE) 500 MG tablet Take one with largest meal (Patient taking differently: Take 500 mg by mouth daily with supper. )  . Multiple Vitamins-Minerals (IMMUNE SUPPORT PO) Take 1 tablet by mouth daily.  . pantoprazole (PROTONIX) 40 MG tablet Take 1 tablet (40 mg total) by mouth daily before breakfast.  . SLOW FE 142 (45 Fe) MG  TBCR Take 142 mg by mouth daily.     ROS:  Review of Systems  Constitutional: Negative.   Respiratory: Negative.   Cardiovascular: Negative.   Gastrointestinal: Negative.   Musculoskeletal: Positive for joint pain (left hip).  Neurological: Negative.   Psychiatric/Behavioral: Negative.      Objective:   Today's Vitals: BP 120/85 (BP Location: Left Arm, Patient Position: Sitting, Cuff Size: Normal)   Pulse 87   Temp (!) 95.9 F (35.5 C) (Temporal)   Ht 4\' 10"  (1.473 m)   Wt 167 lb (75.8 kg)   SpO2 95%   BMI 34.90 kg/m  Vitals with BMI 06/16/2020 06/04/2020 05/26/2020  Height 4\' 10"  4\' 10"  -  Weight 167 lbs 166 lbs -  BMI 96.78 93.8 -  Systolic 101 (No Data) 751  Diastolic 85 (No Data) 61  Pulse 87 - 70     Physical Exam Vitals reviewed.  Constitutional:      Appearance: Normal appearance.  HENT:     Head: Normocephalic and atraumatic.     Right Ear: Tympanic membrane, ear canal and external ear normal.     Left Ear: Tympanic membrane, ear canal and external ear normal.  Eyes:     General:        Right eye: No discharge.        Left eye: No discharge.     Extraocular Movements: Extraocular movements intact.     Conjunctiva/sclera: Conjunctivae normal.     Pupils: Pupils are equal, round, and reactive to light.  Neck:     Vascular: No carotid bruit.  Cardiovascular:     Rate and Rhythm: Normal rate and regular rhythm.     Pulses: Normal pulses.     Heart sounds: Normal heart sounds. No murmur heard.   Pulmonary:     Effort: Pulmonary effort is normal.     Breath sounds: Normal breath sounds.  Chest:     Breasts: Breasts are asymmetrical (right breast; s/p hx of surgery due to hx of right breast cancer).        Right: Normal.        Left: Normal.  Abdominal:     General: Abdomen is flat. Bowel sounds are normal. There is no distension.     Palpations: Abdomen is soft. There is no mass.     Tenderness: There is no abdominal tenderness.  Musculoskeletal:          General: No tenderness.     Cervical back: Neck supple. No muscular tenderness.     Right lower leg: Edema present.     Left lower leg: Edema present.  Lymphadenopathy:     Cervical: No cervical adenopathy.     Upper  Body:     Right upper body: No supraclavicular adenopathy.     Left upper body: No supraclavicular adenopathy.  Skin:    General: Skin is warm and dry.  Neurological:     General: No focal deficit present.     Mental Status: She is alert and oriented to person, place, and time.     Motor: Weakness (biletaral lower extremities) present. No tremor.     Coordination: Coordination is intact.     Gait: Gait is intact. Gait (slow gait; steady with use of cane) normal.  Psychiatric:        Mood and Affect: Mood normal.        Behavior: Behavior normal.        Judgment: Judgment normal.          Assessment and Plan   1. Encounter for general adult medical examination with abnormal findings   2. Need for tetanus booster   3. Iron deficiency anemia, unspecified iron deficiency anemia type   4. Weakness      Plan: 1.,  4.  I do think patient would benefit from some home health physical therapy as well as some assistance with her ADLs specifically bathing and dressing.  Order this today.  I will reorder ask my staff to look into why the DEXA scan has not been scheduled and see if this can be scheduled for the patient.  She was encouraged to consider getting her COVID-19 vaccine and shingles vaccine when she is able to.  2.  Tetanus shot administered today.  She was counseled on signs and symptoms of anaphylaxis reaction and to call [phone] if these were to occur.  Vaccine information sheet provided to patient today.  3.  I will recollect blood work today to monitor CBC and iron levels.   Tests ordered Orders Placed This Encounter  Procedures  . Tdap vaccine greater than or equal to 7yo IM  . CBC with Differential/Platelets  . Iron and TIBC  . Ferritin       No orders of the defined types were placed in this encounter.   Patient to follow-up in 6-8 weeks, or sooner as needed.  In addition to performing an annual physical exam today I did perform an office visit to address her concerns.  [name], NP

## 2020-06-16 NOTE — Telephone Encounter (Signed)
Received letter from [facility]. Letter states number of house hold was missing. Pt put zero. I spoke with pt and she lives alone. I have documented 1 and have faxed papers back. Waiting on an approval or denial of [place].

## 2020-06-17 ENCOUNTER — Other Ambulatory Visit (INDEPENDENT_AMBULATORY_CARE_PROVIDER_SITE_OTHER): Payer: Self-pay | Admitting: Nurse Practitioner

## 2020-06-17 DIAGNOSIS — D509 Iron deficiency anemia, unspecified: Secondary | ICD-10-CM

## 2020-06-21 ENCOUNTER — Other Ambulatory Visit (INDEPENDENT_AMBULATORY_CARE_PROVIDER_SITE_OTHER): Payer: Medicare Other

## 2020-06-25 ENCOUNTER — Other Ambulatory Visit (HOSPITAL_COMMUNITY): Payer: Medicare Other

## 2020-06-28 ENCOUNTER — Other Ambulatory Visit: Payer: Self-pay

## 2020-06-28 ENCOUNTER — Other Ambulatory Visit (INDEPENDENT_AMBULATORY_CARE_PROVIDER_SITE_OTHER): Payer: Medicare Other

## 2020-06-28 ENCOUNTER — Encounter (INDEPENDENT_AMBULATORY_CARE_PROVIDER_SITE_OTHER): Payer: Self-pay | Admitting: Nurse Practitioner

## 2020-06-28 ENCOUNTER — Ambulatory Visit (INDEPENDENT_AMBULATORY_CARE_PROVIDER_SITE_OTHER): Payer: Medicare Other | Admitting: Nurse Practitioner

## 2020-06-28 ENCOUNTER — Telehealth: Payer: Self-pay | Admitting: Emergency Medicine

## 2020-06-28 VITALS — BP 160/89 | HR 82 | Temp 97.3°F | Ht <= 58 in | Wt 168.4 lb

## 2020-06-28 DIAGNOSIS — R030 Elevated blood-pressure reading, without diagnosis of hypertension: Secondary | ICD-10-CM | POA: Diagnosis not present

## 2020-06-28 DIAGNOSIS — R21 Rash and other nonspecific skin eruption: Secondary | ICD-10-CM | POA: Diagnosis not present

## 2020-06-28 DIAGNOSIS — D509 Iron deficiency anemia, unspecified: Secondary | ICD-10-CM | POA: Diagnosis not present

## 2020-06-28 MED ORDER — CLOTRIMAZOLE-BETAMETHASONE 1-0.05 % EX CREA: 1.0000 "application " | TOPICAL_CREAM | Freq: Two times a day (BID) | CUTANEOUS | 0 refills | Status: DC

## 2020-06-28 NOTE — Progress Notes (Signed)
Subjective:  Patient ID: Kimberly Best, female    DOB: 23-Dec-1932  Age: 86 y.o. MRN: [id number]  CC:  Chief Complaint  Patient presents with  . Rash    across the chest      HPI  This patient arrives today for an acute visit for the above.  Tells me approximately 3 days ago a rash erupted on her chest and is quite itchy.  She tells me that she does wear her cell phone around her neck which does touch her chest she is also made some changes to her soap recently.  She does try to itch it with her fingertips as opposed to using her nails.  Nothing seems to really make the rash better or worse.  She also mentions that she has a "boil" in her groin.  She tells me it has started draining and seems to be feeling much better.  She denies any pain and feels that the area is healing well on its own.   Past Medical History:  Diagnosis Date  . Allergy   . Anxiety   . Arthritis   . Cancer [facility])    right breast cancer  . Diabetes mellitus without complication ([place])   . GERD (gastroesophageal reflux disease)   . Hypertension   . Pessary maintenance 03/10/2015      Family History  Problem Relation Age of Onset  . Cancer Father   . Stroke Mother   . Hypertension Mother   . Diabetes Sister   . Hypertension Sister   . Cancer Brother   . Hypertension Brother   . Bipolar disorder Daughter   . Hypertension Maternal Grandmother   . Tuberculosis Paternal Grandmother   . Hypertension Paternal Grandmother   . Alcohol abuse Paternal Grandfather   . Hypertension Paternal Grandfather   . Colon cancer Neg Hx     Social History   Social History Narrative  . Not on file   Social History   Tobacco Use  . Smoking status: Never Smoker  . Smokeless tobacco: Never Used  Substance Use Topics  . Alcohol use: No     Current Meds  Medication Sig  . ACCU-CHEK AVIVA PLUS test strip USE AS DIRECTED TO TEST TWICE DAILY.  Marland Kitchen Accu-Chek Softclix Lancets lancets USE AS DIRECTED TO TEST  TWICE DAILY.  Marland Kitchen acetaminophen (TYLENOL) 650 MG CR tablet Take 650 mg by mouth 2 (two) times daily as needed for pain.  Marland Kitchen aspirin EC 81 MG tablet Take 243 mg by mouth daily.   . cholecalciferol (VITAMIN D) 1000 UNITS tablet Take 1,000 Units by mouth daily.  . fluticasone (FLONASE) 50 MCG/ACT nasal spray Place 2 sprays into both nostrils daily as needed for allergies or rhinitis.  Marland Kitchen lisinopril (ZESTRIL) 2.5 MG tablet Take 1 tablet (2.5 mg total) by mouth daily.  . metFORMIN (GLUCOPHAGE) 500 MG tablet Take one with largest meal (Patient taking differently: Take 500 mg by mouth daily with supper. )  . Multiple Vitamins-Minerals (IMMUNE SUPPORT PO) Take 1 tablet by mouth daily.  . pantoprazole (PROTONIX) 40 MG tablet Take 1 tablet (40 mg total) by mouth daily before breakfast.  . SLOW FE 142 (45 Fe) MG TBCR Take 142 mg by mouth daily.     ROS:  Review of Systems  Skin: Positive for itching and rash.     Objective:   Today's Vitals: BP (!) 160/89 (BP Location: Left Arm, Patient Position: Sitting, Cuff Size: Normal)   Pulse 82  Temp (!) 97.3 F (36.3 C) (Temporal)   Ht 4\' 10"  (1.473 m)   Wt 168 lb 6.4 oz (76.4 kg)   SpO2 94%   BMI 35.20 kg/m  Vitals with BMI 06/28/2020 06/16/2020 06/04/2020  Height 4\' 10"  4\' 10"  4\' 10"   Weight 168 lbs 6 oz 167 lbs 166 lbs  BMI 35.21 16.10 96.0  Systolic 454 098 (No Data)  Diastolic 89 85 (No Data)  Pulse 82 87 -     Physical Exam Skin:              Assessment and Plan   1. Rash   2. Elevated BP without diagnosis of hypertension      Plan: 1.  I will prescribe her a cream that she can use twice a day to treat her rash.  I have told her that if the symptoms do not improve or they worsen over the next 2 weeks she should let us know by giving [place] a call in being reevaluated in the office.  She tells me she understands.  2.  Blood pressure is elevated today, however it has been much better in previous visits.  She does tell me that she  "cheated" in a some food that she thinks could be causing her blood pressure to rise last night.  We will not make changes to any blood pressure medications, however may need to consider this in the future if blood pressure remains elevated.  Tests ordered No orders of the defined types were placed in this encounter.     Meds ordered this encounter  Medications  . clotrimazole-betamethasone (LOTRISONE) cream    Sig: Apply 1 application topically 2 (two) times daily.    Dispense:  30 g    Refill:  0    Order Specific Question:   Supervising Provider    Answer:   Doree Albee [[id number]]    Patient to follow-up as scheduled next month.  [name], NP

## 2020-06-28 NOTE — Telephone Encounter (Signed)
Pt called and stated she received an approval letter on 06/21/20 for her financial assistance. notified pt that our office has not received any documentation showing she has been approved. Once we received something we will give her a call. Pt voiced understanding and thanked me for the call

## 2020-06-29 ENCOUNTER — Telehealth: Payer: Self-pay | Admitting: Internal Medicine

## 2020-06-29 ENCOUNTER — Ambulatory Visit: Payer: Medicare Other | Admitting: Family Medicine

## 2020-06-29 LAB — COMPLETE METABOLIC PANEL WITH GFR
AG Ratio: 1.5 (calc) (ref 1.0–2.5)
ALT: 5 U/L — ABNORMAL LOW (ref 6–29)
AST: 15 U/L (ref 10–35)
Albumin: 4.1 g/dL (ref 3.6–5.1)
Alkaline phosphatase (APISO): 51 U/L (ref 37–153)
BUN: 15 mg/dL (ref 7–25)
CO2: 31 mmol/L (ref 20–32)
Calcium: 9.4 mg/dL (ref 8.6–10.4)
Chloride: 103 mmol/L (ref 98–110)
Creat: 0.86 mg/dL (ref 0.60–0.88)
GFR, Est African American: 71 mL/min/{1.73_m2} (ref >=60)
GFR, Est Non African American: 62 mL/min/{1.73_m2} (ref >=60)
Globulin: 2.8 g/dL (calc) (ref 1.9–3.7)
Glucose, Bld: 92 mg/dL (ref 65–99)
Potassium: 4.7 mmol/L (ref 3.5–5.3)
Sodium: 141 mmol/L (ref 135–146)
Total Bilirubin: 0.4 mg/dL (ref 0.2–1.2)
Total Protein: 6.9 g/dL (ref 6.1–8.1)

## 2020-06-29 LAB — CBC WITH DIFFERENTIAL/PLATELET
Absolute Monocytes: 513 cells/uL (ref 200–950)
Basophils Absolute: 9 cells/uL (ref 0–200)
Basophils Relative: 0.2 %
Eosinophils Absolute: 180 cells/uL (ref 15–500)
Eosinophils Relative: 4 %
HCT: 30.8 % — ABNORMAL LOW (ref 35.0–45.0)
Hemoglobin: 9.9 g/dL — ABNORMAL LOW (ref 11.7–15.5)
Lymphs Abs: 1836 cells/uL (ref 850–3900)
MCH: 28.1 pg (ref 27.0–33.0)
MCHC: 32.1 g/dL (ref 32.0–36.0)
MCV: 87.5 fL (ref 80.0–100.0)
MPV: 9.7 fL (ref 7.5–12.5)
Monocytes Relative: 11.4 %
Neutro Abs: 1962 cells/uL (ref 1500–7800)
Neutrophils Relative %: 43.6 %
Platelets: 288 10*3/uL (ref 140–400)
RBC: 3.52 10*6/uL — ABNORMAL LOW (ref 3.80–5.10)
RDW: 14.5 % (ref 11.0–15.0)
Total Lymphocyte: 40.8 %
WBC: 4.5 10*3/uL (ref 3.8–10.8)

## 2020-06-29 LAB — FERRITIN: Ferritin: 25 ng/mL (ref 16–288)

## 2020-06-29 LAB — IRON, TOTAL/TOTAL IRON BINDING CAP: Iron: 58 ug/dL (ref 45–160)

## 2020-06-29 NOTE — Telephone Encounter (Signed)
Please call patient back regarding her PA. [phone]

## 2020-06-30 NOTE — Telephone Encounter (Signed)
Called pt and notified her we have not received an approval letter on financial assistance. It may take a few weeks for the approval. Once we receive it we will let her know

## 2020-07-14 ENCOUNTER — Other Ambulatory Visit: Payer: Self-pay

## 2020-07-14 ENCOUNTER — Encounter: Payer: Self-pay | Admitting: Gastroenterology

## 2020-07-14 ENCOUNTER — Ambulatory Visit (INDEPENDENT_AMBULATORY_CARE_PROVIDER_SITE_OTHER): Payer: Medicare Other | Admitting: Gastroenterology

## 2020-07-14 ENCOUNTER — Telehealth: Payer: Self-pay

## 2020-07-14 VITALS — BP 136/77 | HR 90 | Temp 97.6°F | Ht <= 58 in | Wt 168.2 lb

## 2020-07-14 DIAGNOSIS — A048 Other specified bacterial intestinal infections: Secondary | ICD-10-CM

## 2020-07-14 DIAGNOSIS — Z791 Long term (current) use of non-steroidal anti-inflammatories (NSAID): Secondary | ICD-10-CM

## 2020-07-14 DIAGNOSIS — D509 Iron deficiency anemia, unspecified: Secondary | ICD-10-CM | POA: Diagnosis not present

## 2020-07-14 DIAGNOSIS — K219 Gastro-esophageal reflux disease without esophagitis: Secondary | ICD-10-CM | POA: Diagnosis not present

## 2020-07-14 MED ORDER — PANTOPRAZOLE SODIUM 40 MG PO TBEC: 40.0000 mg | DELAYED_RELEASE_TABLET | Freq: Every day | ORAL | 5 refills | Status: DC

## 2020-07-14 NOTE — Telephone Encounter (Signed)
Aliene Altes, PA, this is a follow up of pt assistance for pylera. Spoke with [facility] rep. Dr.Rourk wants pt to take medication for 14 days. [facility] only gives Pylera for 10 days. Is a 10 day supply going to be ok?

## 2020-07-14 NOTE — Progress Notes (Signed)
Referring Provider: [name], NP Primary Care Physician:  [name], NP Primary GI Physician: Dr. Gala Romney  Chief Complaint  Patient presents with  . Anemia    f/u.     HPI:   Kimberly Best is a 86 y.o. female presenting today for follow-up s/p TCS and EGD.  Patient was last seen in our office 04/14/2020 at time of initial consult for iron deficiency anemia.  Labs in April 2021 with hemoglobin 9.9, MCHC low at 31.4, iron 36 (L), saturation 12% (L), ferritin 28.  B12 within normal limits. Kidney function within normal limits.  TSH within normal limits.  Denied BRBPR.  She did report pink discoloration on toilet tissue when urinating.  Reported pessary that need to be changed and stated these were normal symptoms prior to getting pessary change.  Started iron about 1 month ago.  Stools were dark, but not black.  No prior TCS or EGD.  Taking 3 baby aspirin daily and 2 Aleve a day for hip pain.  GERD symptoms a couple times a week.  PCP recently started her on a OTC Pepcid which she is taking 1-2 times a week.  Denies dysphagia, nausea, vomiting, or abdominal pain.  Plans for TCS/EGD.  Due to chronic NSAID use and anemia, advised to stop Pepcid and start Protonix 40 mg daily.  Counseled on GERD diet/lifestyle.  Advised to limit Advil and avoid all NSAIDs as much as possible.  Procedure 05/26/2020: TCS: Diverticulosis in sigmoid colon, friable colonic mucosa likely secondary to ASA/NSAID insult, exam was otherwise normal.  No specimens collected.  No recommendations to repeat colonoscopy.  Advised to limit NSAIDs. EGD: Normal esophagus s/p dilation, mucosal changes in the stomach biopsied, normal examined duodenum.  Pathology revealed H. pylori. . Patient was initially prescribed Pylera with Protonix twice daily.  There was trouble with getting Pylera covered.  She was to complete patient assistance forms.   Most recent labs on file 06/28/2020: Hemoglobin 9.9 MCV, MCH, MCHC within normal  limits.  Ferritin 25, iron 58.  Today:  Has not received antibiotics for H. Pylori. States the medication is supposed to be here. Spoke with nursing staff. Medication was denied due to the form being filled out incorrectly and nursing staff is working to get this corrected and resubmitted.   No blood in the stool or black stools. Taking iron daily. No constipation or diarrhea. BMs daily. No regular abdominal pain. Out of Protonix. No significant GERD symptoms. No dysphagia. This resolved after the dilation. Weight is stable. No nausea or vomiting.   No Advil. Only taking aspirin as prescribed and tylenol if needed.   Past Medical History:  Diagnosis Date  . Allergy   . Anxiety   . Arthritis   . Cancer [facility])    right breast cancer  . Diabetes mellitus without complication ([place])   . GERD (gastroesophageal reflux disease)   . H. pylori infection 05/2020  . Hypertension   . Pessary maintenance 03/10/2015    Past Surgical History:  Procedure Laterality Date  . BIOPSY  05/26/2020   Procedure: BIOPSY;  Surgeon: Daneil Dolin, MD;  Location: [facility] SU[facility];  Service: Endoscopy;;  . BREAST SURGERY Right    lumpectomy   . CHOLECYSTECTOMY    . COLONOSCOPY N/A 05/26/2020   Procedure: COLONOSCOPY;  Surgeon: Daneil Dolin, MD; Diverticulosis in sigmoid colon, friable colonic mucosa likely secondary to ASA/NSAID insult, exam was otherwise normal.    . ESOPHAGOGASTRODUODENOSCOPY N/A 05/26/2020  Procedure: ESOPHAGOGASTRODUODENOSCOPY (EGD);  Surgeon: Daneil Dolin, MD;  Normal esophagus s/p dilation, mucosal changes in the stomach biopsied, normal examined duodenum.  Pathology revealed H. pylori.  . EYE SURGERY      Current Outpatient Medications  Medication Sig Dispense Refill  . ACCU-CHEK AVIVA PLUS test strip USE AS DIRECTED TO TEST TWICE DAILY. 100 strip 0  . Accu-Chek Softclix Lancets lancets USE AS DIRECTED TO TEST TWICE DAILY. 100 each 0  . acetaminophen (TYLENOL) 650 MG CR tablet  Take 650 mg by mouth 2 (two) times daily as needed for pain.    Marland Kitchen aspirin EC 81 MG tablet Take 243 mg by mouth daily.     . cholecalciferol (VITAMIN D) 1000 UNITS tablet Take 1,000 Units by mouth daily.    . clotrimazole-betamethasone (LOTRISONE) cream Apply 1 application topically 2 (two) times daily. 30 g 0  . fluticasone (FLONASE) 50 MCG/ACT nasal spray Place 2 sprays into both nostrils daily as needed for allergies or rhinitis.    Marland Kitchen lisinopril (ZESTRIL) 2.5 MG tablet Take 1 tablet (2.5 mg total) by mouth daily. 90 tablet 0  . metFORMIN (GLUCOPHAGE) 500 MG tablet Take one with largest meal (Patient taking differently: Take 500 mg by mouth daily with supper. ) 90 tablet 1  . Multiple Vitamins-Minerals (IMMUNE SUPPORT PO) Take 1 tablet by mouth daily.    . pantoprazole (PROTONIX) 40 MG tablet Take 1 tablet (40 mg total) by mouth daily before breakfast. 30 tablet 5  . SLOW FE 142 (45 Fe) MG TBCR Take 142 mg by mouth daily.      No current facility-administered medications for this visit.    Allergies as of 07/14/2020 - Review Complete 06/28/2020  Allergen Reaction Noted  . Penicillins Swelling 11/18/2014    Family History  Problem Relation Age of Onset  . Cancer Father   . Stroke Mother   . Hypertension Mother   . Diabetes Sister   . Hypertension Sister   . Cancer Brother   . Hypertension Brother   . Bipolar disorder Daughter   . Hypertension Maternal Grandmother   . Tuberculosis Paternal Grandmother   . Hypertension Paternal Grandmother   . Alcohol abuse Paternal Grandfather   . Hypertension Paternal Grandfather   . Colon cancer Neg Hx     Social History   Socioeconomic History  . Marital status: Married    Spouse name: Not on file  . Number of children: Not on file  . Years of education: Not on file  . Highest education level: Not on file  Occupational History  . Not on file  Tobacco Use  . Smoking status: Never Smoker  . Smokeless tobacco: Never Used  Vaping Use    . Vaping Use: Never used  Substance and Sexual Activity  . Alcohol use: No  . Drug use: No  . Sexual activity: Not Currently    Birth control/protection: Post-menopausal  Other Topics Concern  . Not on file  Social History Narrative  . Not on file   Social Determinants of Health   Financial Resource Strain:   . Difficulty of Paying Living Expenses:   Food Insecurity:   . Worried About Charity fundraiser in the Last Year:   . Arboriculturist in the Last Year:   Transportation Needs:   . Film/video editor (Medical):   Marland Kitchen Lack of Transportation (Non-Medical):   Physical Activity:   . Days of Exercise per Week:   . Minutes of Exercise  per Session:   Stress:   . Feeling of Stress :   Social Connections:   . Frequency of Communication with Friends and Family:   . Frequency of Social Gatherings with Friends and Family:   . Attends Religious Services:   . Active Member of Clubs or Organizations:   . Attends Archivist Meetings:   Marland Kitchen Marital Status:     Review of Systems: Gen: Denies fever, chills, cold or flulike symptoms, lightheadedness, dizziness, presyncope, syncope. CV: Denies chest pain or heart palpitations Resp: Denies dyspnea or cough GI: See HPI Heme: See HPI  Physical Exam: BP 136/77   Pulse 90   Temp 97.6 F (36.4 C)   Ht 4\' 10"  (1.473 m)   Wt 168 lb 3.2 oz (76.3 kg)   BMI 35.15 kg/m  General:   Alert and oriented. No distress noted. Pleasant and cooperative.  Head:  Normocephalic and atraumatic. Eyes:  Conjuctiva clear without scleral icterus. Heart:  S1, S2 present without murmurs appreciated. Lungs:  Clear to auscultation bilaterally. No wheezes, rales, or rhonchi. No distress.  Abdomen:  +BS, soft, non-tender and non-distended. No rebound or guarding. No HSM or masses noted. Msk:  Symmetrical without gross deformities. Normal posture. Extremities:  Without edema. Neurologic:  Alert and  oriented x4 Psych:  Normal mood and affect.

## 2020-07-14 NOTE — Patient Instructions (Addendum)
We have not gotten approval for your medications for H. pylori.  We will continue to work on this.  We should hopefully have the medication within the next 1-2 weeks.  Please call our office if you have not heard anything from [place].  Continue taking iron daily.  I am sending in a prescription for Protonix 40 mg to [facility].  Please resume taking Protonix 40 mg daily 30 minutes before breakfast.  Continue to avoid all NSAIDs other than the aspirin you are taking.  This includes Advil, Aleve, Goody powders, naproxen, and anything that says "NSAID" on the package.  We will plan to see you back in 3 months.  Do not hesitate to call if you have questions or concerns prior.  Aliene Altes, PA-C [facility]

## 2020-07-14 NOTE — Telephone Encounter (Signed)
She really needs the medication for 14 days.

## 2020-07-14 NOTE — Telephone Encounter (Signed)
Noted. I tried speaking with a  pharmacist at [facility] and was told there wasn't one to speak with. I asked if there was anything else I could do to help pt be approved for 14 days and they said pt can only be approved for 10 days. I checked GoodRx and it would still cost pt over $300.00 for the remaining 4 days.

## 2020-07-14 NOTE — Telephone Encounter (Signed)
What if we send in Rx for each individual medication?

## 2020-07-15 ENCOUNTER — Encounter: Payer: Self-pay | Admitting: Gastroenterology

## 2020-07-15 NOTE — Assessment & Plan Note (Addendum)
Long-term NSAID use with ASA  243 mg daily.  Also with iron deficiency anemia possibly secondary to H.pylori infection that has not yet been treated (as per above) and mild intermittent GERD symptoms.   Plan: Continue Protonix 40 mg daily 30 minutes before breakfast.  New prescription sent to pharmacy. Nursing staff is working on getting Pylera approved for H. pylori treatment.  Once medication is approved, patient will increase Protonix to twice daily while taking Pylera. Advised to limit all other NSAIDs other than prescribed ASA. Follow-up in 3 months.

## 2020-07-15 NOTE — Assessment & Plan Note (Addendum)
86 year old with anemia with hemoglobin ranging in the 9-10 range since September 2019.  She is found to be iron deficient in April 2021.  B12 within normal limits.  Kidney function within normal.  Colonoscopy 05/26/2020 with diverticulosis, friable colonic mucosa likely secondary to ASA/NSAID insult, otherwise normal exam.  EGD with normal esophagus s/p dilation, mucosal changes in the stomach s/p biopsied revealing H. Pylori, otherwise normal exam.  Unfortunately, patient has not yet received medications for H. pylori due to cost and we are currently working on this.  She denies BRBPR, melena, unintentional weight loss, or any other significant upper or lower GI symptoms.  Most recent labs on file 06/28/2020 with hemoglobin stable at 9.9, MCV, MCH, MCHC within normal limits, ferritin 25, iron 58.  Overall, suspect chronic infection with H. pylori is likely contributing to iron deficiency anemia.  We will hold off on any further evaluation for now until we have treated H pylori unless she were to develop overt GI bleeding or decline in hemoglobin.  Plan: Nursing staff are currently working on getting Pylera approved for H. pylori treatment. Continue oral iron daily. Resume Protonix 40 mg daily 30 minutes before breakfast due to chronic NSAID use (ASA 243 mg daily) and mild intermittent GERD. Limit all other NSAIDs. Monitor for overt GI bleeding. Follow-up in 3 months.

## 2020-07-15 NOTE — Telephone Encounter (Signed)
Noted. Faxed paper work to pt assistance company. Will call pt assistance company this afternoon to see if medication has been approved to send to pt. Will update pt once I've spoken with the pt assistance company.

## 2020-07-15 NOTE — Assessment & Plan Note (Addendum)
H. pylori diagnosed at the time of EGD in June 2021.  Unfortunately, patient has not received treatment for H. pylori as we are currently trying to get approval for Pylera.  Nursing staff is currently working on this and we will help get approval within the next 1 to 2 weeks to start treatment.  She is currently on Protonix 40 mg daily.  While she is undergoing treatment for H. pylori with Pylera, Protonix will be increased to twice daily.

## 2020-07-15 NOTE — Progress Notes (Signed)
CC'ED TO PCP 

## 2020-07-15 NOTE — Assessment & Plan Note (Signed)
Mild intermittent GERD symptoms which do not necessarily require PPI therapy.  However, patient has iron deficiency anemia, recent H. pylori diagnosis, and chronic NSAID use and is therefore recommended to be on PPI daily. She ran out of Protonix 40 mg. I will send in a prescription today.  Notably, symptoms of dysphagia have also resolved since EGD with dilation in June 2021.  Plan: Resume Protonix 40 mg daily 30 minutes before breakfast. Avoid all other NSAIDs other than aspirin as prescribed.

## 2020-07-15 NOTE — Telephone Encounter (Signed)
OK. Up-to-Date recommends 10-14 day treatment for H. pylori. When looking at [facility] itself, it is typically just a 10 day prescription. As [name] has never been treated for H. Pylori in the past, I feel moving forward with the 10 day prescription will be appropriate.   She will need to increase Protonix to 40 mg BID for the 10 days she is on Pylera then resume taking Protonix 40 mg daily thereafter.

## 2020-07-15 NOTE — Telephone Encounter (Signed)
The only way to know the cost is if the medicines are prescribed. Pt's pharmacy can then run pts insurance and give a [place].

## 2020-07-16 NOTE — Telephone Encounter (Signed)
[facility] today 07/16/20 and spoke with Charmaine. Pt is approved for medication as before but they said they were waiting on updated RX. I let them know that RX was faxed yesterday 07/15/20 to [phone]. Per Charmaine, RX probably hasn't been uploaded yet. I was given an urgent fax number as well to use [phone]. Faxed RX to that number as well. Waiting on a reply from [facility].

## 2020-07-20 NOTE — Telephone Encounter (Signed)
Noted  

## 2020-07-20 NOTE — Telephone Encounter (Signed)
Called [employer] [facility] and asked about the status of pts medication. I was asked the instructions for the medication and was told they would ship it out to pt. Pt is aware and if she doesn't receive her medication, she is to call back.

## 2020-07-20 NOTE — Telephone Encounter (Signed)
Yes, pt is aware.

## 2020-07-20 NOTE — Telephone Encounter (Signed)
Noted. She is aware to increase Protonix to BID while taking Pylera, correct?

## 2020-07-25 NOTE — Telephone Encounter (Signed)
Can we follow-up with patient this week to ensure she has received Pylera?

## 2020-07-26 NOTE — Telephone Encounter (Signed)
Spoke with pt. She hasn't received her medication as of yet. Spoke with [employer]. The person I spoke with last submitted the request for Pylera to be sent out to pt but the RX wasn't attached to that request. The RX has been attached today and is being reviewed. I was told it will take 24 hours and then the medication can be processed.

## 2020-07-26 NOTE — Telephone Encounter (Signed)
Noted  

## 2020-07-29 ENCOUNTER — Telehealth: Payer: Self-pay | Admitting: Internal Medicine

## 2020-07-29 NOTE — Telephone Encounter (Signed)
Noted. Spoke with pt, pt is aware to increase Pantoprazole to twice daily when she starts medication. Approval letter will be scanned in pts chart.

## 2020-07-29 NOTE — Telephone Encounter (Signed)
PATIENT CALLED AND SAID THAT HER MEDICATION WAS APPROVED AND WILL BE SENT TO HER HOUSE TOMORROW

## 2020-07-29 NOTE — Telephone Encounter (Signed)
Noted. Routing to Aliene Altes, [place]

## 2020-07-29 NOTE — Telephone Encounter (Signed)
Great! Please remind her to increase Protonix to twice daily when she starts the medication for H. Pylori.

## 2020-08-04 ENCOUNTER — Other Ambulatory Visit (INDEPENDENT_AMBULATORY_CARE_PROVIDER_SITE_OTHER): Payer: Self-pay | Admitting: Nurse Practitioner

## 2020-08-04 DIAGNOSIS — E119 Type 2 diabetes mellitus without complications: Secondary | ICD-10-CM

## 2020-08-05 ENCOUNTER — Other Ambulatory Visit: Payer: Self-pay

## 2020-08-05 ENCOUNTER — Encounter (INDEPENDENT_AMBULATORY_CARE_PROVIDER_SITE_OTHER): Payer: Self-pay | Admitting: Internal Medicine

## 2020-08-05 ENCOUNTER — Ambulatory Visit (INDEPENDENT_AMBULATORY_CARE_PROVIDER_SITE_OTHER): Payer: Medicare Other | Admitting: Internal Medicine

## 2020-08-05 VITALS — BP 126/64 | Temp 97.3°F | Resp 18 | Ht <= 58 in | Wt 168.0 lb

## 2020-08-05 DIAGNOSIS — K219 Gastro-esophageal reflux disease without esophagitis: Secondary | ICD-10-CM | POA: Diagnosis not present

## 2020-08-05 DIAGNOSIS — D509 Iron deficiency anemia, unspecified: Secondary | ICD-10-CM

## 2020-08-05 DIAGNOSIS — E119 Type 2 diabetes mellitus without complications: Secondary | ICD-10-CM

## 2020-08-05 MED ORDER — PANTOPRAZOLE SODIUM 40 MG PO TBEC: 40.0000 mg | DELAYED_RELEASE_TABLET | Freq: Every day | ORAL | 1 refills | Status: DC

## 2020-08-05 NOTE — Progress Notes (Signed)
Metrics: Intervention Frequency ACO  Documented Smoking Status Yearly  Screened one or more times in 24 months  Cessation Counseling or  Active cessation medication Past 24 months  Past 24 months   Guideline developer: UpToDate (See UpToDate for funding source) Date Released: 2014       Wellness Office Visit  Subjective:  Patient ID: Kimberly Best, female    DOB: 1933/09/18  Age: 86 y.o. MRN: [id number]  CC: This lady comes in for follow-up of iron deficiency anemia, obesity, diabetes, hypertension. HPI  She has been given treatment for H. pylori but has not started it and apparently is going to start it today.  There was some issue with the cost of the prescription she was given. She continues on lisinopril for hypertension. She continues on Protonix also for gastroesophageal reflux disease. She continues on Metformin for diabetes. Past Medical History:  Diagnosis Date  . Allergy   . Anxiety   . Arthritis   . Cancer [facility])    right breast cancer  . Diabetes mellitus without complication ([place])   . GERD (gastroesophageal reflux disease)   . H. pylori infection 05/2020  . Hypertension   . Pessary maintenance 03/10/2015   Past Surgical History:  Procedure Laterality Date  . BIOPSY  05/26/2020   Procedure: BIOPSY;  Surgeon: Daneil Dolin, MD;  [facility];  Service: Endoscopy;;  . BREAST SURGERY Right    lumpectomy   . CHOLECYSTECTOMY    . COLONOSCOPY N/A 05/26/2020   Procedure: COLONOSCOPY;  Surgeon: Daneil Dolin, MD; Diverticulosis in sigmoid colon, friable colonic mucosa likely secondary to ASA/NSAID insult, exam was otherwise normal.    . ESOPHAGOGASTRODUODENOSCOPY N/A 05/26/2020   Procedure: ESOPHAGOGASTRODUODENOSCOPY (EGD);  Surgeon: Daneil Dolin, MD;  Normal esophagus s/p dilation, mucosal changes in the stomach biopsied, normal examined duodenum.  Pathology revealed H. pylori.  . EYE SURGERY       Family History  Problem Relation Age of Onset  .  Cancer Father   . Stroke Mother   . Hypertension Mother   . Diabetes Sister   . Hypertension Sister   . Cancer Brother   . Hypertension Brother   . Bipolar disorder Daughter   . Hypertension Maternal Grandmother   . Tuberculosis Paternal Grandmother   . Hypertension Paternal Grandmother   . Alcohol abuse Paternal Grandfather   . Hypertension Paternal Grandfather   . Colon cancer Neg Hx     Social History   Social History Narrative   Widow since 2015,married for 31 years.Lives alone.Retired.   Social History   Tobacco Use  . Smoking status: Never Smoker  . Smokeless tobacco: Never Used  Substance Use Topics  . Alcohol use: No    Current Meds  Medication Sig  . ACCU-CHEK AVIVA PLUS test strip USE AS DIRECTED TO TEST TWICE DAILY.  Marland Kitchen Accu-Chek Softclix Lancets lancets USE AS DIRECTED TO TEST TWICE DAILY.  Marland Kitchen acetaminophen (TYLENOL) 650 MG CR tablet Take 650 mg by mouth 2 (two) times daily as needed for pain.  Marland Kitchen aspirin EC 81 MG tablet Take 243 mg by mouth daily.   Marland Kitchen bismuth-metronidazole-tetracycline (PYLERA) 140-125-125 MG capsule Take 3 capsules by mouth in the morning, at noon, in the evening, and at bedtime. Take 1 capsule at breakfasts, lunch, dinner & 1capsule 30 min's before bedtime.  . cholecalciferol (VITAMIN D) 1000 UNITS tablet Take 1,000 Units by mouth daily.  . clotrimazole-betamethasone (LOTRISONE) cream Apply 1 application topically 2 (two) times daily.  Marland Kitchen  fluticasone (FLONASE) 50 MCG/ACT nasal spray Place 2 sprays into both nostrils daily as needed for allergies or rhinitis.  Marland Kitchen lisinopril (ZESTRIL) 2.5 MG tablet TAKE ONE TABLET (2.5MG  TOTAL) BY MOUTH DAILY  . metFORMIN (GLUCOPHAGE) 500 MG tablet Take one with largest meal (Patient taking differently: Take 500 mg by mouth daily with supper. )  . Multiple Vitamins-Minerals (IMMUNE SUPPORT PO) Take 1 tablet by mouth daily.  . pantoprazole (PROTONIX) 40 MG tablet Take 1 tablet (40 mg total) by mouth daily before  breakfast.  . SLOW FE 142 (45 Fe) MG TBCR Take 142 mg by mouth daily.   . [DISCONTINUED] pantoprazole (PROTONIX) 40 MG tablet Take 1 tablet (40 mg total) by mouth daily before breakfast.      Depression screen [facility] 2/9 06/04/2020 05/05/2020 03/22/2020 12/17/2019 10/15/2015  Decreased Interest 0 0 0 0 0  Down, Depressed, Hopeless 0 0 0 0 0  PHQ - 2 Score 0 0 0 0 0     Objective:   Today's Vitals: BP 126/64 (BP Location: Right Arm, Patient Position: Sitting, Cuff Size: Normal)   Temp (!) 97.3 F (36.3 C) (Temporal)   Resp 18   Ht 4\' 10"  (1.473 m)   Wt 168 lb (76.2 kg)   SpO2 98%   BMI 35.11 kg/m  Vitals with BMI 08/05/2020 07/14/2020 06/28/2020  Height 4\' 10"  4\' 10"  4\' 10"   Weight 168 lbs 168 lbs 3 oz 168 lbs 6 oz  BMI 35.12 86.76 72.09  Systolic 470 962 836  Diastolic 64 77 89  Pulse - 90 82     Physical Exam   She remains obese and has not lost any weight.  Blood pressure is well controlled.  She is alert and orientated without any focal neurological signs.    Assessment   1. Iron deficiency anemia, unspecified iron deficiency anemia type   2. Controlled type 2 diabetes mellitus without complication, without long-term current use of insulin ([place])   3. Gastroesophageal reflux disease, unspecified whether esophagitis present       Tests ordered No orders of the defined types were placed in this encounter.    Plan: 1. She will complete the course for H. pylori.  We will check a hemoglobin after this. 2. She will continue with Metformin for the time being. 3. She will continue with Protonix for gastroesophageal reflux disease. 4. I will see her in the about a month's time and we will do blood work then since she will are finished the course of treatment for H. pylori. 5. Today also discussed with her COVID-19 vaccination and I have urged her to get vaccinated and not doing so would be going [place].   Meds ordered this encounter  Medications  .  pantoprazole (PROTONIX) 40 MG tablet    Sig: Take 1 tablet (40 mg total) by mouth daily before breakfast.    Dispense:  90 tablet    Refill:  1    Brittaney Beaulieu Luther Parody, MD

## 2020-08-12 ENCOUNTER — Other Ambulatory Visit (HOSPITAL_COMMUNITY): Payer: Self-pay | Admitting: Internal Medicine

## 2020-08-12 DIAGNOSIS — Z1231 Encounter for screening mammogram for malignant neoplasm of breast: Secondary | ICD-10-CM

## 2020-08-20 ENCOUNTER — Encounter (HOSPITAL_COMMUNITY): Payer: Self-pay | Admitting: Physical Therapy

## 2020-08-20 NOTE — Therapy (Signed)
[place], [place], [address] Phone: [phone]   Fax:  [phone]  Patient Details  Name: Kimberly Best MRN: [id number] Date of Birth: Mar 16, 1933 Referring Provider:  No ref. provider found  Encounter Date: 08/20/2020   PHYSICAL THERAPY DISCHARGE SUMMARY  Visits from Start of Care: 17  Current functional level related to goals / functional outcomes: unknown   Remaining deficits: Back pain   Education / Equipment: HEP  Plan: Patient agrees to discharge.  Patient goals were not met. Patient is being discharged due to not returning since the last visit.  ?????     Rayetta Humphrey, PT CLT [phone] 08/20/2020, 10:48 AM  [place], [place], [address] Phone: [phone]   Fax:  [phone]

## 2020-09-17 ENCOUNTER — Other Ambulatory Visit: Payer: Self-pay

## 2020-09-17 ENCOUNTER — Ambulatory Visit (HOSPITAL_COMMUNITY)
Admission: RE | Admit: 2020-09-17 | Discharge: 2020-09-17 | Disposition: A | Discharge status: Home / Self Care | Payer: Medicare Other | Source: Ambulatory Visit | Attending: Internal Medicine | Admitting: Internal Medicine

## 2020-09-17 ENCOUNTER — Ambulatory Visit (HOSPITAL_COMMUNITY)
Admission: RE | Admit: 2020-09-17 | Discharge: 2020-09-17 | Disposition: A | Discharge status: Home / Self Care | Payer: Medicare Other | Source: Ambulatory Visit | Attending: Nurse Practitioner | Admitting: Nurse Practitioner

## 2020-09-17 DIAGNOSIS — Z780 Asymptomatic menopausal state: Secondary | ICD-10-CM | POA: Diagnosis not present

## 2020-09-17 DIAGNOSIS — Z1231 Encounter for screening mammogram for malignant neoplasm of breast: Secondary | ICD-10-CM | POA: Diagnosis not present

## 2020-09-17 DIAGNOSIS — M8580 Other specified disorders of bone density and structure, unspecified site: Secondary | ICD-10-CM | POA: Diagnosis not present

## 2020-09-17 DIAGNOSIS — R29890 Loss of height: Secondary | ICD-10-CM | POA: Diagnosis not present

## 2020-09-17 DIAGNOSIS — Z853 Personal history of malignant neoplasm of breast: Secondary | ICD-10-CM | POA: Insufficient documentation

## 2020-09-17 DIAGNOSIS — M8589 Other specified disorders of bone density and structure, multiple sites: Secondary | ICD-10-CM | POA: Diagnosis not present

## 2020-09-17 DIAGNOSIS — Z13820 Encounter for screening for osteoporosis: Secondary | ICD-10-CM | POA: Diagnosis not present

## 2020-09-20 ENCOUNTER — Encounter (INDEPENDENT_AMBULATORY_CARE_PROVIDER_SITE_OTHER): Payer: Self-pay | Admitting: Nurse Practitioner

## 2020-09-20 ENCOUNTER — Other Ambulatory Visit (INDEPENDENT_AMBULATORY_CARE_PROVIDER_SITE_OTHER): Payer: Self-pay | Admitting: Internal Medicine

## 2020-09-21 ENCOUNTER — Encounter (INDEPENDENT_AMBULATORY_CARE_PROVIDER_SITE_OTHER): Payer: Self-pay | Admitting: Internal Medicine

## 2020-09-21 ENCOUNTER — Ambulatory Visit (INDEPENDENT_AMBULATORY_CARE_PROVIDER_SITE_OTHER): Payer: Medicare Other | Admitting: Internal Medicine

## 2020-09-21 ENCOUNTER — Other Ambulatory Visit: Payer: Self-pay

## 2020-09-21 VITALS — BP 133/70 | HR 70 | Temp 97.6°F | Resp 18 | Ht <= 58 in | Wt 171.0 lb

## 2020-09-21 DIAGNOSIS — K219 Gastro-esophageal reflux disease without esophagitis: Secondary | ICD-10-CM | POA: Diagnosis not present

## 2020-09-21 DIAGNOSIS — E119 Type 2 diabetes mellitus without complications: Secondary | ICD-10-CM | POA: Diagnosis not present

## 2020-09-21 DIAGNOSIS — M85859 Other specified disorders of bone density and structure, unspecified thigh: Secondary | ICD-10-CM | POA: Diagnosis not present

## 2020-09-21 DIAGNOSIS — D509 Iron deficiency anemia, unspecified: Secondary | ICD-10-CM | POA: Diagnosis not present

## 2020-09-21 NOTE — Progress Notes (Signed)
Metrics: Intervention Frequency ACO  Documented Smoking Status Yearly  Screened one or more times in 24 months  Cessation Counseling or  Active cessation medication Past 24 months  Past 24 months   Guideline developer: UpToDate (See UpToDate for funding source) Date Released: 2014       Wellness Office Visit  Subjective:  Patient ID: Kimberly Best, female    DOB: 10-27-1933  Age: 86 y.o. MRN: [id number]  CC: This lady comes in for follow-up of iron deficiency anemia, diabetes, gastroesophageal reflux disease. HPI  She was treated for H. pylori infection and seems to feel better after this. She continues on Metformin for diabetes. She continues on lisinopril for her hypertension. She denies any chest pain, dyspnea, palpitations or limb weakness. Past Medical History:  Diagnosis Date  . Allergy   . Anxiety   . Arthritis   . Cancer [facility])    right breast cancer  . Diabetes mellitus without complication ([place])   . GERD (gastroesophageal reflux disease)   . H. pylori infection 05/2020  . Hypertension   . Pessary maintenance 03/10/2015   Past Surgical History:  Procedure Laterality Date  . BIOPSY  05/26/2020   Procedure: BIOPSY;  Surgeon: Daneil Dolin, MD;  [facility];  Service: Endoscopy;;  . BREAST SURGERY Right    lumpectomy   . CHOLECYSTECTOMY    . COLONOSCOPY N/A 05/26/2020   Procedure: COLONOSCOPY;  Surgeon: Daneil Dolin, MD; Diverticulosis in sigmoid colon, friable colonic mucosa likely secondary to ASA/NSAID insult, exam was otherwise normal.    . ESOPHAGOGASTRODUODENOSCOPY N/A 05/26/2020   Procedure: ESOPHAGOGASTRODUODENOSCOPY (EGD);  Surgeon: Daneil Dolin, MD;  Normal esophagus s/p dilation, mucosal changes in the stomach biopsied, normal examined duodenum.  Pathology revealed H. pylori.  . EYE SURGERY       Family History  Problem Relation Age of Onset  . Cancer Father   . Stroke Mother   . Hypertension Mother   . Diabetes Sister   .  Hypertension Sister   . Cancer Brother   . Hypertension Brother   . Bipolar disorder Daughter   . Hypertension Maternal Grandmother   . Tuberculosis Paternal Grandmother   . Hypertension Paternal Grandmother   . Alcohol abuse Paternal Grandfather   . Hypertension Paternal Grandfather   . Colon cancer Neg Hx     Social History   Social History Narrative   Widow since 2015,married for 79 years.Lives alone.Retired.   Social History   Tobacco Use  . Smoking status: Never Smoker  . Smokeless tobacco: Never Used  Substance Use Topics  . Alcohol use: No    Current Meds  Medication Sig  . ACCU-CHEK AVIVA PLUS test strip USE AS DIRECTED TO TEST TWICE DAILY.  Marland Kitchen Accu-Chek Softclix Lancets lancets USE AS DIRECTED TO TEST TWICE DAILY.  Marland Kitchen acetaminophen (TYLENOL) 650 MG CR tablet Take 650 mg by mouth 2 (two) times daily as needed for pain.  Marland Kitchen aspirin EC 81 MG tablet Take 243 mg by mouth daily.   Marland Kitchen bismuth-metronidazole-tetracycline (PYLERA) 140-125-125 MG capsule Take 3 capsules by mouth in the morning, at noon, in the evening, and at bedtime. Take 1 capsule at breakfasts, lunch, dinner & 1capsule 30 min's before bedtime.  . cholecalciferol (VITAMIN D) 1000 UNITS tablet Take 1,000 Units by mouth daily.  . clotrimazole-betamethasone (LOTRISONE) cream Apply 1 application topically 2 (two) times daily.  . fluticasone (FLONASE) 50 MCG/ACT nasal spray Place 2 sprays into both nostrils daily as needed for  allergies or rhinitis.  Marland Kitchen lisinopril (ZESTRIL) 2.5 MG tablet TAKE ONE TABLET (2.5MG  TOTAL) BY MOUTH DAILY  . metFORMIN (GLUCOPHAGE) 500 MG tablet Take one with largest meal (Patient taking differently: Take 500 mg by mouth daily with supper. )  . Multiple Vitamins-Minerals (IMMUNE SUPPORT PO) Take 1 tablet by mouth daily.  . pantoprazole (PROTONIX) 40 MG tablet Take 1 tablet (40 mg total) by mouth daily before breakfast.  . SLOW FE 142 (45 Fe) MG TBCR Take 142 mg by mouth daily.        Depression screen [facility] 2/9 06/04/2020 05/05/2020 03/22/2020 12/17/2019 10/15/2015  Decreased Interest 0 0 0 0 0  Down, Depressed, Hopeless 0 0 0 0 0  PHQ - 2 Score 0 0 0 0 0     Objective:   Today's Vitals: BP 133/70 (BP Location: Left Arm, Patient Position: Sitting, Cuff Size: Normal)   Pulse 70   Temp 97.6 F (36.4 C) (Temporal)   Resp 18   Ht 4\' 10"  (1.473 m)   Wt 171 lb (77.6 kg)   SpO2 96%   BMI 35.74 kg/m  Vitals with BMI 09/21/2020 08/05/2020 07/14/2020  Height 4\' 10"  4\' 10"  4\' 10"   Weight 171 lbs 168 lbs 168 lbs 3 oz  BMI 35.75 16.10 96.04  Systolic 540 981 191  Diastolic 70 64 77  Pulse 70 - 90     Physical Exam  She looks systemically well.  Weight is stable.  Blood pressure is excellent for age.  No new physical findings.     Assessment   1. Iron deficiency anemia, unspecified iron deficiency anemia type   2. Controlled type 2 diabetes mellitus without complication, without long-term current use of insulin ([place])   3. Gastroesophageal reflux disease, unspecified whether esophagitis present   4. Osteopenia of neck of femur, unspecified laterality       Tests ordered Orders Placed This Encounter  Procedures  . CBC  . COMPLETE METABOLIC PANEL WITH GFR  . Hemoglobin A1c  . Iron,Total/Total Iron Binding Cap     Plan: 1. We will check a CBC and iron studies to see how her anemia and iron stores are now as far as her anemia is concerned. 2. She will continue with Metformin and we will check an A1c for diabetic control.  I would not be very strict with her control at her age. 3. She will continue with PPI for gastroesophageal reflux disease. 4. I did discuss her bone density scan with her which shows osteopenia.  I recommended walking as a good exercise to help her bones. 5. High-dose influenza vaccination was given today.  She said she would like to get COVID-19 vaccination and she plans on doing so. 6. Follow-up with Kimberly Best in about 3 months.   No  orders of the defined types were placed in this encounter.   Doree Albee, MD

## 2020-09-22 LAB — IRON, TOTAL/TOTAL IRON BINDING CAP
%SAT: 14 % (calc) — ABNORMAL LOW (ref 16–45)
Iron: 46 ug/dL (ref 45–160)
TIBC: 323 mcg/dL (calc) (ref 250–450)

## 2020-09-22 LAB — CBC
HCT: 31.4 % — ABNORMAL LOW (ref 35.0–45.0)
Hemoglobin: 10.1 g/dL — ABNORMAL LOW (ref 11.7–15.5)
MCH: 28.2 pg (ref 27.0–33.0)
MCHC: 32.2 g/dL (ref 32.0–36.0)
MCV: 87.7 fL (ref 80.0–100.0)
MPV: 9.7 fL (ref 7.5–12.5)
Platelets: 292 10*3/uL (ref 140–400)
RBC: 3.58 10*6/uL — ABNORMAL LOW (ref 3.80–5.10)
RDW: 13.8 % (ref 11.0–15.0)
WBC: 5.7 10*3/uL (ref 3.8–10.8)

## 2020-09-22 LAB — COMPLETE METABOLIC PANEL WITH GFR
AG Ratio: 1.5 (calc) (ref 1.0–2.5)
ALT: 5 U/L — ABNORMAL LOW (ref 6–29)
AST: 13 U/L (ref 10–35)
Albumin: 4.3 g/dL (ref 3.6–5.1)
Alkaline phosphatase (APISO): 58 U/L (ref 37–153)
BUN/Creatinine Ratio: 17 (calc) (ref 6–22)
BUN: 16 mg/dL (ref 7–25)
CO2: 27 mmol/L (ref 20–32)
Calcium: 9.7 mg/dL (ref 8.6–10.4)
Chloride: 104 mmol/L (ref 98–110)
Creat: 0.94 mg/dL — ABNORMAL HIGH (ref 0.60–0.88)
GFR, Est African American: 64 mL/min/{1.73_m2} (ref >=60)
GFR, Est Non African American: 55 mL/min/{1.73_m2} — ABNORMAL LOW (ref >=60)
Globulin: 2.8 g/dL (calc) (ref 1.9–3.7)
Glucose, Bld: 70 mg/dL (ref 65–99)
Potassium: 4.9 mmol/L (ref 3.5–5.3)
Sodium: 141 mmol/L (ref 135–146)
Total Bilirubin: 0.3 mg/dL (ref 0.2–1.2)
Total Protein: 7.1 g/dL (ref 6.1–8.1)

## 2020-09-22 LAB — HEMOGLOBIN A1C
Hgb A1c MFr Bld: 6.4 % of total Hgb — ABNORMAL HIGH (ref <5.7)
Mean Plasma Glucose: 137 (calc)
eAG (mmol/L): 7.6 (calc)

## 2020-09-23 NOTE — Progress Notes (Signed)
Please call this patient and let her know that her anemia is stable, slightly improving.  Continue to take iron tablets.  Follow-up as scheduled.

## 2020-09-27 NOTE — Progress Notes (Signed)
Called patient and given lab results via phone call.

## 2020-10-13 NOTE — Progress Notes (Signed)
Referring Provider: [name], NP Primary Care Physician:  [name], NP Primary GI Physician: Dr. Gala Romney  Chief Complaint  Patient presents with  . Anemia    having some dizziness    HPI:   Kimberly Best is a 86 y.o. female presenting today with a history of IDA with hemoglobin in the 9-10 range since September 2019 and found to be iron deficient in April 2021, GERD, and dysphagia.  EGD 05/26/2020 with normal esophagus s/p dilation, mucosal changes in stomach biopsied and revealed H. pylori, normal examined duodenum.  Colonoscopy with diverticulosis in sigmoid colon, friable colonic mucosa likely secondary to ASA/NSAID insult, otherwise normal exam.  No recommendations to repeat colonoscopy.  She is presenting today for follow-up of IDA s/p treatment of H. Pylori.  Last seen 07/14/2020.  She had not received antibiotics yet for H. pylori.  Denied BRBPR or melena.  She was taking iron daily.  She is out of Protonix.  She had no other significant GI symptoms.  Reported dysphagia had resolved after dilation.  She had stopped Advil but continue aspirin as prescribed.  Nursing staff was to work on getting Pylera approved for H. pylori treatment.  She was to continue Protonix daily aside from twice daily while taking Pylera, continue to avoid all NSAIDs other than ASA, continue iron daily, follow-up in 3 months.  Per telephone note 07/29/2020, patient stated medication was approved and would be sent to her house the following day.  She was advised to call if she did not receive her medication.  Most recent labs 09/21/2020.  Hemoglobin 10.1, up from 9.9 on 06/28/2020.  Iron 46, saturation 14% (L).  No ferritin obtained.  Today:  Completed antibiotics around the first week of September.  She did increase Protonix to twice daily dosing while taking Pylera but is now reduced it back to once daily.  She has not significant GERD symptoms. No blood in the stool or black stools. No hematuria, vaginal  bleeding, or nose bleeds.  No abdominal pain, nausea, vomiting, or dysphagia.  Taking OTC iron daily but cannot remember the dose. Continue taking 243 mg ASA daily.  No other NSAIDs.  BMs daily. No constipation or diarrhea.  Past Medical History:  Diagnosis Date  . Allergy   . Anxiety   . Arthritis   . Cancer [facility])    right breast cancer  . Diabetes mellitus without complication ([place])   . GERD (gastroesophageal reflux disease)   . H. pylori infection 05/2020  . Hypertension   . Pessary maintenance 03/10/2015    Past Surgical History:  Procedure Laterality Date  . BIOPSY  05/26/2020   Procedure: BIOPSY;  Surgeon: Daneil Dolin, MD;  [facility];  Service: Endoscopy;;  . BREAST SURGERY Right    lumpectomy   . CHOLECYSTECTOMY    . COLONOSCOPY N/A 05/26/2020   Procedure: COLONOSCOPY;  Surgeon: Daneil Dolin, MD; Diverticulosis in sigmoid colon, friable colonic mucosa likely secondary to ASA/NSAID insult, exam was otherwise normal.    . ESOPHAGOGASTRODUODENOSCOPY N/A 05/26/2020   Procedure: ESOPHAGOGASTRODUODENOSCOPY (EGD);  Surgeon: Daneil Dolin, MD;  Normal esophagus s/p dilation, mucosal changes in the stomach biopsied, normal examined duodenum.  Pathology revealed H. pylori.  . EYE SURGERY      Current Outpatient Medications  Medication Sig Dispense Refill  . ACCU-CHEK AVIVA PLUS test strip USE AS DIRECTED TO TEST TWICE DAILY. 100 strip 0  . Accu-Chek Softclix Lancets lancets USE AS DIRECTED  TO TEST TWICE DAILY. 100 each 0  . acetaminophen (TYLENOL) 650 MG CR tablet Take 650 mg by mouth 2 (two) times daily as needed for pain.    Marland Kitchen aspirin EC 81 MG tablet Take 243 mg by mouth daily.     . cholecalciferol (VITAMIN D) 1000 UNITS tablet Take 1,000 Units by mouth daily.    . clotrimazole-betamethasone (LOTRISONE) cream Apply 1 application topically 2 (two) times daily. 30 g 0  . fluticasone (FLONASE) 50 MCG/ACT nasal spray Place 2 sprays into both nostrils daily as  needed for allergies or rhinitis.    Marland Kitchen lisinopril (ZESTRIL) 2.5 MG tablet TAKE ONE TABLET (2.5MG  TOTAL) BY MOUTH DAILY 90 tablet 0  . metFORMIN (GLUCOPHAGE) 500 MG tablet Take one with largest meal (Patient taking differently: Take 500 mg by mouth daily with supper. ) 90 tablet 1  . Multiple Vitamins-Minerals (IMMUNE SUPPORT PO) Take 1 tablet by mouth daily.    . pantoprazole (PROTONIX) 40 MG tablet Take 1 tablet (40 mg total) by mouth daily before breakfast. 90 tablet 1  . ferrous sulfate 325 (65 FE) MG EC tablet Take 1 tablet (325 mg total) by mouth daily with breakfast. 30 tablet 3   No current facility-administered medications for this visit.    Allergies as of 10/14/2020 - Review Complete 10/14/2020  Allergen Reaction Noted  . Penicillins Swelling 11/18/2014    Family History  Problem Relation Age of Onset  . Cancer Father   . Stroke Mother   . Hypertension Mother   . Diabetes Sister   . Hypertension Sister   . Cancer Brother   . Hypertension Brother   . Bipolar disorder Daughter   . Hypertension Maternal Grandmother   . Tuberculosis Paternal Grandmother   . Hypertension Paternal Grandmother   . Alcohol abuse Paternal Grandfather   . Hypertension Paternal Grandfather   . Colon cancer Neg Hx     Social History   Socioeconomic History  . Marital status: Married    Spouse name: Not on file  . Number of children: Not on file  . Years of education: Not on file  . Highest education level: Not on file  Occupational History  . Not on file  Tobacco Use  . Smoking status: Never Smoker  . Smokeless tobacco: Never Used  Vaping Use  . Vaping Use: Never used  Substance and Sexual Activity  . Alcohol use: No  . Drug use: No  . Sexual activity: Not Currently    Birth control/protection: Post-menopausal  Other Topics Concern  . Not on file  Social History Narrative   Widow since 2015,married for 52 years.Lives alone.Retired.   Social Determinants of Health   Financial  Resource Strain:   . Difficulty of Paying Living Expenses: Not on file  Food Insecurity:   . Worried About Charity fundraiser in the Last Year: Not on file  . Ran Out of Food in the Last Year: Not on file  Transportation Needs:   . Lack of Transportation (Medical): Not on file  . Lack of Transportation (Non-Medical): Not on file  Physical Activity:   . Days of Exercise per Week: Not on file  . Minutes of Exercise per Session: Not on file  Stress:   . Feeling of Stress : Not on file  Social Connections:   . Frequency of Communication with Friends and Family: Not on file  . Frequency of Social Gatherings with Friends and Family: Not on file  . Attends Religious Services:  Not on file  . Active Member of Clubs or Organizations: Not on file  . Attends Archivist Meetings: Not on file  . Marital Status: Not on file    Review of Systems: Gen: Denies fever, chills, cold or flulike symptoms, presyncope, syncope. CV: Denies chest pain or heart palpitations. Resp: Denies dyspnea.  Admits to intermittent cough. GI: See HPI Heme: See HPI  Physical Exam: BP (!) 154/66   Pulse 80   Temp (!) 96.6 F (35.9 C) (Temporal)   Ht 4\' 10"  (1.473 m)   Wt 172 lb 3.2 oz (78.1 kg)   BMI 35.99 kg/m  General:   Alert and oriented. No distress noted. Pleasant and cooperative.  Head:  Normocephalic and atraumatic. Eyes:  Conjuctiva clear without scleral icterus. Heart:  S1, S2 present without murmurs appreciated. Lungs:  Clear to auscultation bilaterally. No wheezes, rales, or rhonchi. No distress.  Abdomen:  +BS, soft, non-tender and non-distended. No rebound or guarding. No HSM or masses noted.  Msk:  Symmetrical without gross deformities. Normal posture. Extremities:  Without edema. Neurologic:  Alert and  oriented x4 Psych:  Normal mood and affect.

## 2020-10-14 ENCOUNTER — Ambulatory Visit: Payer: Medicare Other | Admitting: Gastroenterology

## 2020-10-14 ENCOUNTER — Encounter: Payer: Self-pay | Admitting: Gastroenterology

## 2020-10-14 ENCOUNTER — Other Ambulatory Visit: Payer: Self-pay

## 2020-10-14 VITALS — BP 154/66 | HR 80 | Temp 96.6°F | Ht <= 58 in | Wt 172.2 lb

## 2020-10-14 DIAGNOSIS — A048 Other specified bacterial intestinal infections: Secondary | ICD-10-CM | POA: Diagnosis not present

## 2020-10-14 DIAGNOSIS — K219 Gastro-esophageal reflux disease without esophagitis: Secondary | ICD-10-CM

## 2020-10-14 DIAGNOSIS — D509 Iron deficiency anemia, unspecified: Secondary | ICD-10-CM

## 2020-10-14 DIAGNOSIS — Z791 Long term (current) use of non-steroidal anti-inflammatories (NSAID): Secondary | ICD-10-CM | POA: Diagnosis not present

## 2020-10-14 MED ORDER — FERROUS SULFATE 325 (65 FE) MG PO TBEC: 325.0000 mg | DELAYED_RELEASE_TABLET | Freq: Every day | ORAL | 3 refills | Status: DC

## 2020-10-14 NOTE — Assessment & Plan Note (Addendum)
Very mild intermittent GERD symptoms that do not necessary require PPI therapy.  However, due to iron deficiency anemia and chronic NSAID use, I have recommended for patient to continue Protonix 40 mg daily.

## 2020-10-14 NOTE — Assessment & Plan Note (Addendum)
Long-term NSAID use with ASA 243 mg daily. Also with mild intermittent GERD and component of iron deficiency possibly secondary to H. Pylori which is addressed above. With chronic NSAID use and IDA, I have advised patient to remain on Protonix 40 mg daily.

## 2020-10-14 NOTE — Patient Instructions (Addendum)
Please stop Protonix and do not take any other over-the-counter acid suppression medications or Pepto-Bismol for the next 14 days.  Please have H. pylori breath test completed on 11/19.  After yonths.  Please monitor for any bright red blood per rectum or black stools and let us know if this occurs.u complete H. pylori breath test, you may resume Protonix daily 30 minutes before breakfast.  Please continue taking iron daily.  I have sent a prescription to your pharmacy.  We will plan to see you back in 3 mo  It was great seeing you today!  Aliene Altes, PA-C [facility]

## 2020-10-14 NOTE — Assessment & Plan Note (Addendum)
H. pylori detected on EGD in June 2021.  She is now s/p treatment with Pylera which was completed around the first week of September. We will have her hold Protonix x14 days and complete H. Pylori breath test on 11/19.  She will resume Protonix daily after H. pylori breath test.

## 2020-10-14 NOTE — Assessment & Plan Note (Addendum)
86 y.o. female with anemia with hemoglobin ranging in 9-10 range since September 2019, found to have component of IDA in April 2021. B12 wnl. Kidney function wnl.  Colonoscopy 05/26/2020 with diverticulosis, friable colonic mucosa likely secondary to ASA/NSAID insult, otherwise normal exam.  EGD with normal esophagus s/p dilation, mucosal changes in the stomach s/p biopsy revealing H. pylori, otherwise normal exam.  She completed Pylera around the first week of September 2021.  Most recent labs 09/21/2020 with hemoglobin 10.1, up from 9.9 on 06/28/2020.  Iron saturation slightly low at 14%, which is fairly stable.  She is taking OTC iron daily (not sure what dose).  Denies BRBPR, melena, or any other obvious blood loss.   Suspect chronic H. pylori may have been the etiology of her IDA.  As H. Pylori was just treated about 2 months ago, we will check for eradication and hold off on any further evaluation of IDA to see if her hemoglobin and iron will recover.  If her hemoglobin and iron do not continue to improve, will need to consider further evaluation with Givens capsule. Considering she is taking 243 mg ASA daily, she could also have intermittent oozing from small bowel AVMs.   Plan:  Hold Protonix x14 days then complete H. Pylori breath test (10/29/20).  Resume Protonix 40 mg daily after completing H. pylori breath test. Continue to avoid all NSAIDs other than prescribed ASA. Continue oral iron daily.  I have sent a prescription for ferrous sulfate 325 mg daily to her pharmacy. Advise she monitor for bright red blood per rectum or black stools and let us know if this occurs. Follow-up in 3 months.  We will repeat labs at that time to determine if further testing is needed.

## 2020-10-18 ENCOUNTER — Ambulatory Visit: Payer: Medicare Other | Admitting: Obstetrics & Gynecology

## 2020-10-18 ENCOUNTER — Encounter: Payer: Self-pay | Admitting: Obstetrics & Gynecology

## 2020-10-18 ENCOUNTER — Other Ambulatory Visit: Payer: Self-pay

## 2020-10-18 VITALS — BP 142/75 | HR 77 | Ht 59.0 in | Wt 171.0 lb

## 2020-10-18 DIAGNOSIS — Z4689 Encounter for fitting and adjustment of other specified devices: Secondary | ICD-10-CM | POA: Diagnosis not present

## 2020-10-18 DIAGNOSIS — N813 Complete uterovaginal prolapse: Secondary | ICD-10-CM

## 2020-10-18 NOTE — Progress Notes (Signed)
Chief Complaint  Patient presents with  . Pessary Check    Blood pressure (!) 142/75, pulse 77, height 4\' 11"  (1.499 m), weight 171 lb (77.6 kg).  Kimberly Best presents today for routine follow up related to her pessary.   She uses a Gelhorn pessary  She has quite a bit of mucosal pressure changes  I am refitting her today with a Milex ring with support #5  It is placed today without discomfort and I will see her for faster follow up evaluation since we are changing the style  She reports moderate vaginal discharge or vaginal bleeding.  Exam reveals , no discharge and little vaginal bleeding.  The pessary is removed, cleaned and replaced with a Milex ring with support #5  [name] will be sen back in 1 months for continued follow up.  Florian Buff, MD  10/18/2020 9:19 AM

## 2020-10-25 ENCOUNTER — Telehealth (INDEPENDENT_AMBULATORY_CARE_PROVIDER_SITE_OTHER): Payer: Medicare Other | Admitting: Internal Medicine

## 2020-10-25 ENCOUNTER — Encounter (INDEPENDENT_AMBULATORY_CARE_PROVIDER_SITE_OTHER): Payer: Self-pay | Admitting: Internal Medicine

## 2020-10-25 VITALS — Ht 59.0 in | Wt 171.0 lb

## 2020-10-25 DIAGNOSIS — R42 Dizziness and giddiness: Secondary | ICD-10-CM | POA: Diagnosis not present

## 2020-10-25 MED ORDER — MECLIZINE HCL 12.5 MG PO TABS: 12.5000 mg | ORAL_TABLET | Freq: Three times a day (TID) | ORAL | 0 refills | Status: DC | PRN

## 2020-10-25 NOTE — Progress Notes (Signed)
Metrics: Intervention Frequency ACO  Documented Smoking Status Yearly  Screened one or more times in 24 months  Cessation Counseling or  Active cessation medication Past 24 months  Past 24 months   Guideline developer: UpToDate (See UpToDate for funding source) Date Released: 2014       Wellness Office Visit  Subjective:  Patient ID: Kimberly Best, female    DOB: June 29, 1933  Age: 86 y.o. MRN: [id number]  CC: This is an audio telemedicine visit with the permission of the patient who is at home and I am in my office.  I used 2 identifiers to identify the patient. Dizziness. HPI  She has been complaining of vertigo for the last 2 to 3 days.  It had sudden onset.  There was no history of head injury.  She denies any respiratory symptoms.  She has had this before in the past and it appears that meclizine did seem to help her.  The dizziness/vertigo can happen even when she is lying flat.  She has had no loss of consciousness with the vertigo or chest pain or palpitations. Past Medical History:  Diagnosis Date  . Allergy   . Anxiety   . Arthritis   . Cancer [facility])    right breast cancer  . Diabetes mellitus without complication ([place])   . GERD (gastroesophageal reflux disease)   . H. pylori infection 05/2020  . Hypertension   . Pessary maintenance 03/10/2015   Past Surgical History:  Procedure Laterality Date  . BIOPSY  05/26/2020   Procedure: BIOPSY;  Surgeon: Daneil Dolin, MD;  [facility];  Service: Endoscopy;;  . BREAST SURGERY Right    lumpectomy   . CHOLECYSTECTOMY    . COLONOSCOPY N/A 05/26/2020   Procedure: COLONOSCOPY;  Surgeon: Daneil Dolin, MD; Diverticulosis in sigmoid colon, friable colonic mucosa likely secondary to ASA/NSAID insult, exam was otherwise normal.    . ESOPHAGOGASTRODUODENOSCOPY N/A 05/26/2020   Procedure: ESOPHAGOGASTRODUODENOSCOPY (EGD);  Surgeon: Daneil Dolin, MD;  Normal esophagus s/p dilation, mucosal changes in the stomach biopsied,  normal examined duodenum.  Pathology revealed H. pylori.  . EYE SURGERY       Family History  Problem Relation Age of Onset  . Cancer Father   . Stroke Mother   . Hypertension Mother   . Diabetes Sister   . Hypertension Sister   . Cancer Brother   . Hypertension Brother   . Bipolar disorder Daughter   . Hypertension Maternal Grandmother   . Tuberculosis Paternal Grandmother   . Hypertension Paternal Grandmother   . Alcohol abuse Paternal Grandfather   . Hypertension Paternal Grandfather   . Colon cancer Neg Hx     Social History   Social History Narrative   Widow since 2015,married for 6 years.Lives alone.Retired.   Social History   Tobacco Use  . Smoking status: Never Smoker  . Smokeless tobacco: Never Used  Substance Use Topics  . Alcohol use: No    Current Meds  Medication Sig  . ACCU-CHEK AVIVA PLUS test strip USE AS DIRECTED TO TEST TWICE DAILY.  Marland Kitchen Accu-Chek Softclix Lancets lancets USE AS DIRECTED TO TEST TWICE DAILY.  Marland Kitchen acetaminophen (TYLENOL) 650 MG CR tablet Take 650 mg by mouth 2 (two) times daily as needed for pain.  Marland Kitchen aspirin EC 81 MG tablet Take 243 mg by mouth daily.   . cholecalciferol (VITAMIN D) 1000 UNITS tablet Take 1,000 Units by mouth daily.  . clotrimazole-betamethasone (LOTRISONE) cream Apply 1 application  topically 2 (two) times daily.  . ferrous sulfate 325 (65 FE) MG EC tablet Take 1 tablet (325 mg total) by mouth daily with breakfast.  . fluticasone (FLONASE) 50 MCG/ACT nasal spray Place 2 sprays into both nostrils daily as needed for allergies or rhinitis.  Marland Kitchen lisinopril (ZESTRIL) 2.5 MG tablet TAKE ONE TABLET (2.5MG  TOTAL) BY MOUTH DAILY  . metFORMIN (GLUCOPHAGE) 500 MG tablet Take one with largest meal (Patient taking differently: Take 500 mg by mouth daily with supper. )  . Multiple Vitamins-Minerals (IMMUNE SUPPORT PO) Take 1 tablet by mouth daily.  . pantoprazole (PROTONIX) 40 MG tablet Take 1 tablet (40 mg total) by mouth daily  before breakfast.      Depression screen [facility] 2/9 06/04/2020 05/05/2020 03/22/2020 12/17/2019 10/15/2015  Decreased Interest 0 0 0 0 0  Down, Depressed, Hopeless 0 0 0 0 0  PHQ - 2 Score 0 0 0 0 0     Objective:   Today's Vitals: Ht 4\' 11"  (1.499 m)   Wt 171 lb (77.6 kg)   BMI 34.54 kg/m  Vitals with BMI 10/25/2020 10/18/2020 10/14/2020  Height 4\' 11"  4\' 11"  4\' 10"   Weight 171 lbs 171 lbs 172 lbs 3 oz  BMI 54.65 68.12 36  Systolic (No Data) 751 700  Diastolic (No Data) 75 66  Pulse (No Data) 77 80     Physical Exam  She appears to be alert and orientated on the phone.     Assessment   1. Vertigo       Tests ordered No orders of the defined types were placed in this encounter.    Plan: 1. I will treat her empirically with meclizine to see if it will help her vertigo.  If she does not improve in about a week's time, I have instructed her to call us back and at that time we will probably need to bring her into the office to examine her. 2. This phone call lasted 5 minutes   Meds ordered this encounter  Medications  . meclizine (ANTIVERT) 12.5 MG tablet    Sig: Take 1 tablet (12.5 mg total) by mouth 3 (three) times daily as needed for dizziness.    Dispense:  30 tablet    Refill:  0    Sun Kihn Luther Parody, MD

## 2020-11-02 LAB — H. PYLORI BREATH TEST

## 2020-11-08 ENCOUNTER — Other Ambulatory Visit (INDEPENDENT_AMBULATORY_CARE_PROVIDER_SITE_OTHER): Payer: Self-pay | Admitting: Internal Medicine

## 2020-11-08 ENCOUNTER — Other Ambulatory Visit: Payer: Self-pay

## 2020-11-08 DIAGNOSIS — E119 Type 2 diabetes mellitus without complications: Secondary | ICD-10-CM

## 2020-11-08 DIAGNOSIS — Z8619 Personal history of other infectious and parasitic diseases: Secondary | ICD-10-CM

## 2020-11-18 ENCOUNTER — Ambulatory Visit: Payer: Medicare Other | Admitting: Obstetrics & Gynecology

## 2020-11-18 ENCOUNTER — Encounter: Payer: Self-pay | Admitting: Obstetrics & Gynecology

## 2020-11-18 ENCOUNTER — Other Ambulatory Visit: Payer: Self-pay

## 2020-11-18 VITALS — BP 133/70 | HR 89 | Ht <= 58 in | Wt 174.5 lb

## 2020-11-18 DIAGNOSIS — Z4689 Encounter for fitting and adjustment of other specified devices: Secondary | ICD-10-CM | POA: Diagnosis not present

## 2020-11-18 DIAGNOSIS — N813 Complete uterovaginal prolapse: Secondary | ICD-10-CM

## 2020-11-18 NOTE — Progress Notes (Signed)
Chief Complaint  Patient presents with  . Pessary Check    Blood pressure 133/70, pulse 89, height 4\' 10"  (1.473 m), weight 174 lb 8 oz (79.2 kg).  Kimberly Best presents today for routine follow up related to her pessary.   She uses a Milex ring with support #5 She reports no vaginal discharge and no vaginal bleeding   Likert scale(1 not bothersome -5 very bothersome)  :  1  Exam reveals no undue vaginal mucosal pressure of breakdown, no discharge and no vaginal bleeding.  Vaginal Epithelial Abnormality Classification System: 0 0    No abnormalities 1    Epithelial erythema 2    Granulation tissue 3    Epithelial break or erosion, 1 cm or less 4    Epithelial break or erosion, 1 cm or greater   The pessary is removed, cleaned and replaced without difficulty.      ICD-10-CM   1. Pessary maintenance, refit 10/18/20 with Milex ring with support #5  Z46.89   2. Uterovaginal prolapse, complete  N81.3      [name] will be sen back in 3 months for continued follow up.  Florian Buff, MD  11/18/2020 9:50 AM

## 2020-12-14 ENCOUNTER — Other Ambulatory Visit (INDEPENDENT_AMBULATORY_CARE_PROVIDER_SITE_OTHER): Payer: Self-pay | Admitting: Internal Medicine

## 2020-12-21 DIAGNOSIS — Z8619 Personal history of other infectious and parasitic diseases: Secondary | ICD-10-CM | POA: Diagnosis not present

## 2020-12-22 LAB — HELICOBACTER PYLORI  SPECIAL ANTIGEN
MICRO NUMBER:: 11406985
SPECIMEN QUALITY: ADEQUATE

## 2020-12-29 ENCOUNTER — Ambulatory Visit (INDEPENDENT_AMBULATORY_CARE_PROVIDER_SITE_OTHER): Payer: Medicare Other | Admitting: Nurse Practitioner

## 2021-01-06 ENCOUNTER — Other Ambulatory Visit (INDEPENDENT_AMBULATORY_CARE_PROVIDER_SITE_OTHER): Payer: Self-pay | Admitting: Internal Medicine

## 2021-01-15 NOTE — Progress Notes (Signed)
Referring Provider: [name], NP Primary Care Physician:  [name], NP Primary GI Physician: Dr. Gala Romney  Chief Complaint  Patient presents with  . Anemia    Twitch sensation in rib cage    HPI:   Kimberly Best is a 87 y.o. female with history of IDA with hemoglobin in the 9-10 range since September 2019 and found to be iron deficient in April 2021, GERD, dysphagia s/p empiric dilation in June 2021, H. pylori in June 2021 s/p treatment with Pylera.  EGD and colonoscopy were both performed in June 2021 for IDA.  EGD with normal examined esophagus, mild mucosal changes in the stomach biopsied revealing H. pylori, normal duodenum.  Colonoscopy with diverticulosis in the sigmoid colon, friable colonic mucosa likely secondary to ASA/NSAID insult, otherwise normal exam.  No recommendations to repeat.  She is presenting today for follow-up.  Last seen in our office 10/14/2020.  Most recent labs at that point from October 2021 with hemoglobin 10.1, up from 9.9 in July 2021.  Iron 46, saturation 14% (L).  She had completed antibiotics for H. pylori and was taking Protonix once daily with no significant GERD symptoms.  No overt GI bleeding or other obvious blood loss.  No other significant upper or lower GI symptoms.  She was taking OTC iron daily.  Continued taking 243 mg aspirin daily but no other NSAIDs.  Suspected chronic H. pylori may have been the etiology of IDA.  Would hold off on any further GI work-up and monitor for now. Plan to stop Protonix x14 days then complete H. pylori breath test.  Resume Protonix after breath test.  Continue iron daily and follow-up in 3 months.  H. pylori breath test was insufficient. Completed H. pylori stool antigen 12/21/2020 and was negative.  Advised to resume Protonix daily.  Today: No brbpr or melena. Little vaginal bleeding/spotting since recent pessary change.  Stools can be dark at times due to iron. Had been taking iron daily, but ran out  yesterday. Taking Protonix 40 mg daily. No GERD symptom or dysphagia. No abdominal pain, nausea, or vomiting.   Has some tenderness along her right rib cage. This started prior to June of 2021. [place] in her bathroom in March 2021.  States she fell on her right side on the floor.  Notices symptoms when pressing on the area or twisting/bending.  Not affected by meals or bowel movements.   When taking iron, she has occasional constipation which she controls with diet. No diarrhea.   Past Medical History:  Diagnosis Date  . Allergy   . Anxiety   . Arthritis   . Cancer [facility])    right breast cancer  . Diabetes mellitus without complication ([place])   . GERD (gastroesophageal reflux disease)   . H. pylori infection 05/2020   S/p treatment with Pylera.  H. pylori stool antigen negative on 12/21/2020.  Marland Kitchen Hypertension   . Pessary maintenance 03/10/2015    Past Surgical History:  Procedure Laterality Date  . BIOPSY  05/26/2020   Procedure: BIOPSY;  Surgeon: Daneil Dolin, MD;  Location[facility];  Service: Endoscopy;;  . BREAST SURGERY Right    lumpectomy   . CHOLECYSTECTOMY    . COLONOSCOPY N/A 05/26/2020   Procedure: COLONOSCOPY;  Surgeon: Daneil Dolin, MD; Diverticulosis in sigmoid colon, friable colonic mucosa likely secondary to ASA/NSAID insult, exam was otherwise normal.    . ESOPHAGOGASTRODUODENOSCOPY N/A 05/26/2020   Procedure: ESOPHAGOGASTRODUODENOSCOPY (EGD);  Surgeon:  Rourk, [name], MD;  Normal esophagus s/p dilation, mucosal changes in the stomach biopsied, normal examined duodenum.  Pathology revealed H. pylori.  . EYE SURGERY      Current Outpatient Medications  Medication Sig Dispense Refill  . ACCU-CHEK AVIVA PLUS test strip USE AS DIRECTED TO TEST TWICE DAILY. 100 strip 0  . Accu-Chek Softclix Lancets lancets USE AS DIRECTED TO TEST TWICE DAILY. 100 each 0  . acetaminophen (TYLENOL) 650 MG CR tablet Take 650 mg by mouth 2 (two) times daily as needed for pain.    Marland Kitchen  aspirin EC 81 MG tablet Take 243 mg by mouth daily.     . cholecalciferol (VITAMIN D) 1000 UNITS tablet Take 1,000 Units by mouth daily.    . clotrimazole-betamethasone (LOTRISONE) cream Apply 1 application topically 2 (two) times daily. 30 g 0  . fluticasone (FLONASE) 50 MCG/ACT nasal spray Place 2 sprays into both nostrils daily as needed for allergies or rhinitis.    Marland Kitchen lisinopril (ZESTRIL) 2.5 MG tablet TAKE ONE TABLET (2.5MG  TOTAL) BY MOUTH DAILY 90 tablet 0  . meclizine (ANTIVERT) 25 MG tablet TAKE TWO TABLETS BY MOUTH FOUR TIMES A DAY AS NEEDED 30 tablet 3  . metFORMIN (GLUCOPHAGE) 500 MG tablet Take one with largest meal (Patient taking differently: Take 500 mg by mouth daily with supper.) 90 tablet 1  . Multiple Vitamins-Minerals (IMMUNE SUPPORT PO) Take 1 tablet by mouth daily.    . pantoprazole (PROTONIX) 40 MG tablet Take 1 tablet (40 mg total) by mouth daily before breakfast. 90 tablet 1  . SLOW FE 142 (45 Fe) MG TBCR Take 1 tablet by mouth every morning. (Patient not taking: Reported on 01/17/2021)     No current facility-administered medications for this visit.    Allergies as of 01/17/2021 - Review Complete 01/17/2021  Allergen Reaction Noted  . Penicillins Swelling 11/18/2014    Family History  Problem Relation Age of Onset  . Cancer Father   . Stroke Mother   . Hypertension Mother   . Diabetes Sister   . Hypertension Sister   . Cancer Brother   . Hypertension Brother   . Bipolar disorder Daughter   . Hypertension Maternal Grandmother   . Tuberculosis Paternal Grandmother   . Hypertension Paternal Grandmother   . Alcohol abuse Paternal Grandfather   . Hypertension Paternal Grandfather   . Colon cancer Neg Hx     Social History   Socioeconomic History  . Marital status: Married    Spouse name: Not on file  . Number of children: Not on file  . Years of education: Not on file  . Highest education level: Not on file  Occupational History  . Not on file   Tobacco Use  . Smoking status: Never Smoker  . Smokeless tobacco: Never Used  Vaping Use  . Vaping Use: Never used  Substance and Sexual Activity  . Alcohol use: No  . Drug use: No  . Sexual activity: Not Currently    Birth control/protection: Post-menopausal  Other Topics Concern  . Not on file  Social History Narrative   Widow since 2015,married for 25 years.Lives alone.Retired.   Social Determinants of Health   Financial Resource Strain: Not on file  Food Insecurity: Not on file  Transportation Needs: Not on file  Physical Activity: Not on file  Stress: Not on file  Social Connections: Not on file    Review of Systems: Gen: Denies fever, chills, cold or flulike symptoms.  She does  have intermittent dizziness and has recently been prescribed meclizine.  PCP suspects possible vertigo. CV: Denies chest pain or palpitations. Resp: Denies dyspnea or cough. GI: See HPI Heme: See HPI  Physical Exam: BP (!) 171/83   Pulse 88   Temp (!) 96.6 F (35.9 C) (Temporal)   Ht 4\' 10"  (1.473 m)   Wt 177 lb 6.4 oz (80.5 kg)   BMI 37.08 kg/m  General:   Alert and oriented. No distress noted. Pleasant and cooperative.  Head:  Normocephalic and atraumatic. Eyes:  Conjuctiva clear without scleral icterus. Heart:  S1, S2 present without murmurs appreciated. Lungs:  Clear to auscultation bilaterally. No wheezes, rales, or rhonchi. No distress.  Abdomen:  +BS, soft, non-tender and non-distended. No rebound or guarding. No HSM or masses noted.   Msk:  Symmetrical without gross deformities. Patient has mild tenderness to palpation along the right rib cage in the mid and anterior axillary line below breast. Extremities:  Without edema. Neurologic:  Alert and  oriented x4 Psych: Normal mood and affect.

## 2021-01-17 ENCOUNTER — Other Ambulatory Visit: Payer: Self-pay

## 2021-01-17 ENCOUNTER — Ambulatory Visit: Payer: Medicare Other | Admitting: Gastroenterology

## 2021-01-17 ENCOUNTER — Encounter: Payer: Self-pay | Admitting: Gastroenterology

## 2021-01-17 VITALS — BP 171/83 | HR 88 | Temp 96.6°F | Ht <= 58 in | Wt 177.4 lb

## 2021-01-17 DIAGNOSIS — R0781 Pleurodynia: Secondary | ICD-10-CM

## 2021-01-17 DIAGNOSIS — R0789 Other chest pain: Secondary | ICD-10-CM | POA: Insufficient documentation

## 2021-01-17 DIAGNOSIS — Z791 Long term (current) use of non-steroidal anti-inflammatories (NSAID): Secondary | ICD-10-CM

## 2021-01-17 DIAGNOSIS — D509 Iron deficiency anemia, unspecified: Secondary | ICD-10-CM | POA: Diagnosis not present

## 2021-01-17 NOTE — Assessment & Plan Note (Signed)
Pain along right rib cage for more than 8 months with history of fall prior to onset.  She notes tenderness when pressing on the area or twisting/bending.  No association with meals or bowel movements. Exam with mild TTP along right rib cage in the mid to anterior axillary line below breast.  No RUQ abdominal TTP.   Suspect MSK etiology.  Cannot rule out chronic underlying rib fracture.  Patient has follow-up with PCP on 2/17.  Advise she discuss symptoms with primary care at that time.

## 2021-01-17 NOTE — Assessment & Plan Note (Addendum)
87 year old female with anemia with hemoglobin ranging in 9-10 range in September 2019, found to have component of IDA in April 2021.  B12 WNL.  Kidney function WNL.  Colonoscopy 05/26/2020 with diverticulosis, friable colonic mucosa likely secondary to ASA/NSAID insult, otherwise normal.  EGD with normal esophagus s/p dilation, mucosal changes in the stomach s/p biopsy revealing H. pylori, otherwise normal exam.  She completed Pylera first week of September 2021.  H. pylori stool antigen 12/21/2020 negative.    Last labs in October 2021 with hemoglobin 10.1, iron 46, saturation 14% (L). She is maintained on Protonix daily in the setting of chronic ASA use.  Had also been taking oral iron daily until yesterday when she ran out.  No overt GI bleeding.  She does tell me she is had a little vaginal bleeding/spotting since recent pessary change.   Plan: 1.  Update CBC and iron panel. 2.  Continue Protonix 40 mg daily. 3.  Further recommendations to follow lab results.  Will refill oral iron at that time if needed.  Suspect chronic H. Pylori may have been etiology of IDA. We will repeat CBC and iron panel to see if hemoglobin and iron have recovered.  If hemoglobin and iron panel have not continued to improve, would need to consider further evaluation with [place] capsule.  With ASA 243 mg daily, she could have intermittent oozing from small bowel AVMs.

## 2021-01-17 NOTE — Assessment & Plan Note (Signed)
Long-term NSAID use with ASA 243 mg daily.  Also with history of mild intermittent GERD and component of iron deficiency anemia possibly secondary to H. pylori as discussed above.  With chronic NSAID use and IDA, I have advised she remain on Protonix 40 mg daily for now.

## 2021-01-17 NOTE — Patient Instructions (Signed)
Please have labs completed at [facility] taking Protonix 40 mg daily 30 minutes before breakfast.  We will call you with your lab results and have further recommendations at that time.  If needed, I will send in a new prescription for iron once we have your results.   Please discuss your right-sided rib pain with your primary care at your follow-up visit on 2/17.    It was good to see you today!  Aliene Altes, PA-C [facility]

## 2021-01-20 ENCOUNTER — Other Ambulatory Visit: Payer: Self-pay | Admitting: Gastroenterology

## 2021-01-20 ENCOUNTER — Encounter: Payer: Self-pay | Admitting: *Deleted

## 2021-01-20 DIAGNOSIS — D509 Iron deficiency anemia, unspecified: Secondary | ICD-10-CM

## 2021-01-20 LAB — CBC WITH DIFFERENTIAL/PLATELET
Absolute Monocytes: 624 cells/uL (ref 200–950)
Basophils Absolute: 10 cells/uL (ref 0–200)
Basophils Relative: 0.2 %
Eosinophils Absolute: 120 cells/uL (ref 15–500)
Eosinophils Relative: 2.3 %
HCT: 31.7 % — ABNORMAL LOW (ref 35.0–45.0)
Hemoglobin: 10.4 g/dL — ABNORMAL LOW (ref 11.7–15.5)
Lymphs Abs: 2382 cells/uL (ref 850–3900)
MCH: 28.5 pg (ref 27.0–33.0)
MCHC: 32.8 g/dL (ref 32.0–36.0)
MCV: 86.8 fL (ref 80.0–100.0)
MPV: 9.9 fL (ref 7.5–12.5)
Monocytes Relative: 12 %
Neutro Abs: 2064 cells/uL (ref 1500–7800)
Neutrophils Relative %: 39.7 %
Platelets: 299 10*3/uL (ref 140–400)
RBC: 3.65 10*6/uL — ABNORMAL LOW (ref 3.80–5.10)
RDW: 14.1 % (ref 11.0–15.0)
Total Lymphocyte: 45.8 %
WBC: 5.2 10*3/uL (ref 3.8–10.8)

## 2021-01-20 LAB — IRON,TIBC AND FERRITIN PANEL
%SAT: 22 % (calc) (ref 16–45)
Ferritin: 40 ng/mL (ref 16–288)
Iron: 69 ug/dL (ref 45–160)
TIBC: 308 mcg/dL (calc) (ref 250–450)

## 2021-01-20 MED ORDER — SLOW FE 142 (45 FE) MG PO TBCR: 2.0000 | EXTENDED_RELEASE_TABLET | Freq: Every morning | ORAL | 3 refills | Status: DC

## 2021-01-27 ENCOUNTER — Other Ambulatory Visit: Payer: Self-pay

## 2021-01-27 ENCOUNTER — Ambulatory Visit (INDEPENDENT_AMBULATORY_CARE_PROVIDER_SITE_OTHER): Payer: Medicare Other | Admitting: Nurse Practitioner

## 2021-01-27 ENCOUNTER — Encounter (INDEPENDENT_AMBULATORY_CARE_PROVIDER_SITE_OTHER): Payer: Self-pay | Admitting: Nurse Practitioner

## 2021-01-27 VITALS — BP 143/82 | HR 84 | Temp 97.0°F | Ht <= 58 in | Wt 176.8 lb

## 2021-01-27 DIAGNOSIS — I10 Essential (primary) hypertension: Secondary | ICD-10-CM | POA: Diagnosis not present

## 2021-01-27 DIAGNOSIS — R0789 Other chest pain: Secondary | ICD-10-CM

## 2021-01-27 DIAGNOSIS — E119 Type 2 diabetes mellitus without complications: Secondary | ICD-10-CM | POA: Diagnosis not present

## 2021-01-27 DIAGNOSIS — D509 Iron deficiency anemia, unspecified: Secondary | ICD-10-CM | POA: Diagnosis not present

## 2021-01-27 NOTE — Progress Notes (Signed)
Subjective:  Patient ID: Kimberly Best, female    DOB: Jun 20, 1933  Age: 87 y.o. MRN: [id number]  CC:  Chief Complaint  Patient presents with  . Diabetes  . Other    Chest wall pain  . Hypertension  . Anemia      HPI  This patient arrives today for the above.  Diabetes: She continues on metformin 500 mg daily.  Last A1c was 6.4.  Hypertension: She continues on lisinopril 2.5 mg daily.  She reports intermittent dizzy or "woozy" sensations that are transient and will resolve spontaneously.  She does not have an at home blood pressure cuff but plans on ordering one from her insurance.  Right-sided chest wall pain: She mentions she has some right-sided chest wall pain and tenderness that has been ongoing for a few months.  It appears that she fell approximately 8 months ago.  It seems to mostly bother her when she is laying down on her right side trying to sleep.  The pain is reported as mild in intensity.  Anemia: She is following gastroenterology for treatment of her anemia.  She does continue on iron supplement.  Most recent CBC showed hemoglobin of 10.4 with low-normal iron and ferritin levels.  She tells me she is planning to undergo the capsule study for further evaluation in the near future.  Past Medical History:  Diagnosis Date  . Allergy   . Anxiety   . Arthritis   . Cancer [facility])    right breast cancer  . Diabetes mellitus without complication ([place])   . GERD (gastroesophageal reflux disease)   . H. pylori infection 05/2020   S/p treatment with Pylera.  H. pylori stool antigen negative on 12/21/2020.  Marland Kitchen Hypertension   . Pessary maintenance 03/10/2015      Family History  Problem Relation Age of Onset  . Cancer Father   . Stroke Mother   . Hypertension Mother   . Diabetes Sister   . Hypertension Sister   . Cancer Brother   . Hypertension Brother   . Bipolar disorder Daughter   . Hypertension Maternal Grandmother   . Tuberculosis Paternal Grandmother   .  Hypertension Paternal Grandmother   . Alcohol abuse Paternal Grandfather   . Hypertension Paternal Grandfather   . Colon cancer Neg Hx     Social History   Social History Narrative   Widow since 2015,married for 62 years.Lives alone.Retired.   Social History   Tobacco Use  . Smoking status: Never Smoker  . Smokeless tobacco: Never Used  Substance Use Topics  . Alcohol use: No     Current Meds  Medication Sig  . acetaminophen (TYLENOL) 650 MG CR tablet Take 650 mg by mouth 2 (two) times daily as needed for pain.  Marland Kitchen aspirin EC 81 MG tablet Take 243 mg by mouth daily.   . cholecalciferol (VITAMIN D) 1000 UNITS tablet Take 1,000 Units by mouth daily.  . clotrimazole-betamethasone (LOTRISONE) cream Apply 1 application topically 2 (two) times daily.  . fluticasone (FLONASE) 50 MCG/ACT nasal spray Place 2 sprays into both nostrils daily as needed for allergies or rhinitis.  Marland Kitchen lisinopril (ZESTRIL) 2.5 MG tablet TAKE ONE TABLET (2.5MG TOTAL) BY MOUTH DAILY  . meclizine (ANTIVERT) 25 MG tablet TAKE TWO TABLETS BY MOUTH FOUR TIMES A DAY AS NEEDED  . metFORMIN (GLUCOPHAGE) 500 MG tablet Take one with largest meal (Patient taking differently: Take 500 mg by mouth daily with supper.)  . Multiple Vitamins-Minerals (IMMUNE  SUPPORT PO) Take 1 tablet by mouth daily.  . pantoprazole (PROTONIX) 40 MG tablet Take 1 tablet (40 mg total) by mouth daily before breakfast.  . SLOW FE 142 (45 Fe) MG TBCR Take 2 tablets by mouth every morning.    ROS:  Review of Systems  Constitutional: Negative for chills, fever and weight loss.  Eyes: Negative for blurred vision.  Respiratory: Negative for shortness of breath.   Cardiovascular: Positive for chest pain (chest wall).  Gastrointestinal: Negative for abdominal pain, blood in stool, melena, nausea and vomiting.  Neurological: Positive for dizziness (intermittent). Negative for headaches.     Objective:   Today's Vitals: BP (!) 143/82   Pulse 84    Temp (!) 97 F (36.1 C)   Ht _0  (1.473 m)   Wt 176 lb 12.8 oz (80.2 kg)   SpO2 99%   BMI 36.95 kg/m  Vitals with BMI 01/27/2021 01/17/2021 11/18/2020  Height _1  _2  _3   Weight 176 lbs 13 oz 177 lbs 6 oz 174 lbs 8 oz  BMI 36.96 12.45 80.99  Systolic 833 825 053  Diastolic 82 83 70  Pulse 84 88 89     Physical Exam Vitals reviewed.  Constitutional:      General: She is not in acute distress.    Appearance: Normal appearance.  HENT:     Head: Normocephalic and atraumatic.  Neck:     Vascular: No carotid bruit.  Cardiovascular:     Rate and Rhythm: Normal rate and regular rhythm.     Pulses: Normal pulses.     Heart sounds: Normal heart sounds.  Pulmonary:     Effort: Pulmonary effort is normal.     Breath sounds: Normal breath sounds.  Chest:     Chest wall: Tenderness present. No mass or swelling.    Skin:    General: Skin is warm and dry.  Neurological:     General: No focal deficit present.     Mental Status: She is alert and oriented to person, place, and time.  Psychiatric:        Mood and Affect: Mood normal.        Behavior: Behavior normal.        Judgment: Judgment normal.          Assessment and Plan   1. Chest wall pain   2. Controlled type 2 diabetes mellitus without complication, without long-term current use of insulin ([place])   3. Hypertension, unspecified type   4. Iron deficiency anemia, unspecified iron deficiency anemia type      Plan: 1.  We will send for x-ray of ribs for further evaluation.  She tells me she think she will be able to get to the hospital on Monday to have x-rays completed.  In the meantime I have encouraged her to use pillows as needed for support and bracing with position changes.  Further recommendations may be made based upon chest x-ray results. 2.  We will check A1c and urine for microalbuminuria today. 3.  Blood pressure mildly elevated, but I am concerned about these intermittent dizzy spells and the  fact that she has fallen within the last year.  Do not want to increase risk of falls by further reducing blood pressure.  I encouraged her to get an at home blood pressure cuff and will have her return to clinic in about 6 weeks for close monitoring of her blood pressure.  If blood pressure remains elevated at next  office visit we will need to increase her lisinopril. 4.  She will continue to follow-up with gastroenterology as scheduled.   Tests ordered Orders Placed This Encounter  Procedures  . DG Ribs Bilateral  . Hemoglobin A1c  . CMP with eGFR([facility])      No orders of the defined types were placed in this encounter.   Patient to follow-up in 6 weeks or sooner as needed.  [name], NP

## 2021-01-28 LAB — COMPLETE METABOLIC PANEL WITH GFR
AG Ratio: 1.6 (calc) (ref 1.0–2.5)
ALT: 10 U/L (ref 6–29)
AST: 17 U/L (ref 10–35)
Albumin: 4.2 g/dL (ref 3.6–5.1)
Alkaline phosphatase (APISO): 55 U/L (ref 37–153)
BUN: 21 mg/dL (ref 7–25)
CO2: 28 mmol/L (ref 20–32)
Calcium: 9.3 mg/dL (ref 8.6–10.4)
Chloride: 105 mmol/L (ref 98–110)
Creat: 0.84 mg/dL (ref 0.60–0.88)
GFR, Est African American: 73 mL/min/{1.73_m2} (ref >=60)
GFR, Est Non African American: 63 mL/min/{1.73_m2} (ref >=60)
Globulin: 2.7 g/dL (calc) (ref 1.9–3.7)
Glucose, Bld: 107 mg/dL — ABNORMAL HIGH (ref 65–99)
Potassium: 5 mmol/L (ref 3.5–5.3)
Sodium: 140 mmol/L (ref 135–146)
Total Bilirubin: 0.4 mg/dL (ref 0.2–1.2)
Total Protein: 6.9 g/dL (ref 6.1–8.1)

## 2021-01-28 LAB — HEMOGLOBIN A1C
Hgb A1c MFr Bld: 6.5 % of total Hgb — ABNORMAL HIGH (ref <5.7)
Mean Plasma Glucose: 140 mg/dL
eAG (mmol/L): 7.7 mmol/L

## 2021-01-28 LAB — MICROALBUMIN / CREATININE URINE RATIO
Creatinine, Urine: 109 mg/dL (ref 20–275)
Microalb Creat Ratio: 43 mcg/mg creat — ABNORMAL HIGH (ref <30)
Microalb, Ur: 4.7 mg/dL

## 2021-01-31 ENCOUNTER — Encounter (INDEPENDENT_AMBULATORY_CARE_PROVIDER_SITE_OTHER): Payer: Self-pay | Admitting: Nurse Practitioner

## 2021-01-31 ENCOUNTER — Telehealth (INDEPENDENT_AMBULATORY_CARE_PROVIDER_SITE_OTHER): Payer: Self-pay | Admitting: Nurse Practitioner

## 2021-01-31 ENCOUNTER — Other Ambulatory Visit: Payer: Self-pay

## 2021-01-31 ENCOUNTER — Ambulatory Visit (HOSPITAL_COMMUNITY)
Admission: RE | Admit: 2021-01-31 | Discharge: 2021-01-31 | Disposition: A | Discharge status: Home / Self Care | Payer: Medicare Other | Source: Ambulatory Visit | Attending: Nurse Practitioner | Admitting: Nurse Practitioner

## 2021-01-31 DIAGNOSIS — R0789 Other chest pain: Secondary | ICD-10-CM | POA: Diagnosis not present

## 2021-01-31 NOTE — Telephone Encounter (Signed)
Please print letter dated 01/31/21 authored by myself and mail to the patient. Thank you.

## 2021-01-31 NOTE — Telephone Encounter (Signed)
Done

## 2021-02-07 ENCOUNTER — Other Ambulatory Visit (INDEPENDENT_AMBULATORY_CARE_PROVIDER_SITE_OTHER): Payer: Self-pay | Admitting: Nurse Practitioner

## 2021-02-07 DIAGNOSIS — E119 Type 2 diabetes mellitus without complications: Secondary | ICD-10-CM

## 2021-02-16 ENCOUNTER — Ambulatory Visit (HOSPITAL_COMMUNITY)
Admission: RE | Admit: 2021-02-16 | Discharge: 2021-02-16 | Disposition: A | Discharge status: Home / Self Care | Payer: Medicare Other | Attending: Internal Medicine | Admitting: Internal Medicine

## 2021-02-16 ENCOUNTER — Encounter (HOSPITAL_COMMUNITY)
Admission: RE | Disposition: A | Discharge status: Home / Self Care | Payer: Self-pay | Source: Home / Self Care | Attending: Internal Medicine

## 2021-02-16 DIAGNOSIS — D500 Iron deficiency anemia secondary to blood loss (chronic): Secondary | ICD-10-CM

## 2021-02-16 DIAGNOSIS — K5730 Diverticulosis of large intestine without perforation or abscess without bleeding: Secondary | ICD-10-CM | POA: Diagnosis not present

## 2021-02-16 DIAGNOSIS — Z7982 Long term (current) use of aspirin: Secondary | ICD-10-CM | POA: Insufficient documentation

## 2021-02-16 DIAGNOSIS — D509 Iron deficiency anemia, unspecified: Secondary | ICD-10-CM | POA: Insufficient documentation

## 2021-02-16 DIAGNOSIS — K259 Gastric ulcer, unspecified as acute or chronic, without hemorrhage or perforation: Secondary | ICD-10-CM | POA: Diagnosis not present

## 2021-02-16 HISTORY — PX: GIVENS CAPSULE STUDY: SHX5432

## 2021-02-16 SURGERY — IMAGING PROCEDURE, GI TRACT, INTRALUMINAL, VIA CAPSULE

## 2021-02-17 ENCOUNTER — Ambulatory Visit: Payer: Medicare Other | Admitting: Obstetrics & Gynecology

## 2021-02-17 ENCOUNTER — Telehealth: Payer: Self-pay | Admitting: Gastroenterology

## 2021-02-17 ENCOUNTER — Encounter: Payer: Self-pay | Admitting: Obstetrics & Gynecology

## 2021-02-17 ENCOUNTER — Other Ambulatory Visit: Payer: Self-pay

## 2021-02-17 VITALS — BP 148/69 | HR 77 | Wt 171.0 lb

## 2021-02-17 DIAGNOSIS — N813 Complete uterovaginal prolapse: Secondary | ICD-10-CM

## 2021-02-17 DIAGNOSIS — Z4689 Encounter for fitting and adjustment of other specified devices: Secondary | ICD-10-CM | POA: Diagnosis not present

## 2021-02-17 DIAGNOSIS — K259 Gastric ulcer, unspecified as acute or chronic, without hemorrhage or perforation: Secondary | ICD-10-CM

## 2021-02-17 NOTE — Op Note (Signed)
   Small Bowel [place] Capsule Study Procedure date:  02/16/2021  Referring Provider: Aliene Altes, PA-C PCP:  Dr. [name], NP  Indication for procedure: Persistent iron deficiency anemia in the setting of chronic aspirin use, rule out small bowel source. EGD and colonoscopy were both performed in June 2021 for IDA.  EGD with normal examined esophagus, mild mucosal changes in the stomach biopsied revealing H. pylori, normal duodenum.  Colonoscopy with diverticulosis in the sigmoid colon, friable colonic mucosa likely secondary to ASA/NSAID insult, otherwise normal exam.  Treated for H pylori September 2021, confirmed eradication via H. pylori stool antigen January 2022.  Patient data:  Wt: 80.5 kg Ht: 4 feet 10 inches  Findings: Patient had some difficulty swallowing capsule.  First gastric image at 5 minutes 58 seconds.  Exam was complete, capsule reaching colon.  She had a gastric erosion noted at 1 hour 17 minutes and 7 seconds.  Several duodenal bulbar erosions noted, for example 1 hour 55 minutes 9 seconds, 1 hour 55 minutes 16 seconds, 1 hour 55 minutes 18 seconds, 1 hour 57 minutes 57 seconds.  Lymphangiectasia noted at 1 hour 58 minutes 2 seconds. Small bowel ulcers without active bleeding noted at 2 hours 2 minutes 48 seconds and 2 hours 5 minutes 6 seconds, benign appearing.  First Gastric image: 1 hour 17 minutes 7 seconds First Duodenal image: 1 hour 58 minutes 4 seconds First Ileo-Cecal Valve image: 4 hours 39 minutes 30 seconds First Cecal image: 4 hours 43 minutes 33 seconds Gastric Passage time: 1 hour 52 minutes  Small Bowel Passage time: 2 hours 45 minutes   Summary & Recommendations:  Study with evidence of gastric and duodenal bulbar erosions, and 2 nonbleeding small bowel ulcers with benign appearance.  Findings likely secondary to ongoing aspirin use.    1. Limit NSAIDs (ibuprofen, Aleve, etc.) as much as possible.  Use lowest dose aspirin needed, can reevaluate  with PCP regarding need for ongoing use. 2. Continue pantoprazole 40 mg daily. 3. Continue oral iron. 4. Will follow clinically including periodic H&H checks. 5. Patient should monitor for black or bloody stools, worsening fatigue, etc.  Pertinent images reviewed with Dr. Gala Romney.  Laureen Ochs. Bernarda Caffey [facility] [phone] 3/10/20224:19 PM

## 2021-02-17 NOTE — Progress Notes (Signed)
Chief Complaint  Patient presents with  . Pessary Check    Blood pressure (!) 148/69, pulse 77, weight 171 lb (77.6 kg).  Kimberly Best presents today for routine follow up related to her pessary.   She uses a Milex ring with support #5 She reports no vaginal discharge and little vaginal bleeding   Likert scale(1 not bothersome -5 very bothersome)  :  1  Exam reveals no undue vaginal mucosal pressure of breakdown, no discharge and little vaginal bleeding.  Vaginal Epithelial Abnormality Classification System:   0 0    No abnormalities 1    Epithelial erythema 2    Granulation tissue 3    Epithelial break or erosion, 1 cm or less 4    Epithelial break or erosion, 1 cm or greater  The pessary is removed, cleaned and replaced without difficulty.      ICD-10-CM   1. Pessary maintenance, refit 10/18/20 with Milex ring with support #5  Z46.89   2. Uterovaginal prolapse, complete  N81.3      Kimberly Best will be sen back in 4 months for continued follow up.  Florian Buff, MD  02/17/2021 10:33 AM

## 2021-02-17 NOTE — Telephone Encounter (Signed)
Lets plan to follow-up in the office in mid May with repeat CBC and iron panel 1 week prior to OV.

## 2021-02-17 NOTE — Telephone Encounter (Signed)
Please inform patient of the following.  I reviewed her capsule study today.  Findings and recommendations as outlined below.  I will defer to Aliene Altes, PA-C who has been following the patient with regards to timing of future H&H checks.   Study with evidence of gastric and duodenal bulbar erosions, and 2 nonbleeding small bowel ulcers with benign appearance.  Findings likely secondary to ongoing aspirin use.    1. Limit NSAIDs (ibuprofen, Aleve, etc.) as much as possible.  Use lowest dose aspirin needed, can reevaluate with PCP regarding need for ongoing use. 2. Continue pantoprazole 40 mg daily. 3. Continue oral iron. 4. Will follow clinically including periodic H&H checks. 5. Patient should monitor for black or bloody stools, worsening fatigue, etc.

## 2021-02-18 ENCOUNTER — Other Ambulatory Visit: Payer: Self-pay

## 2021-02-18 DIAGNOSIS — D509 Iron deficiency anemia, unspecified: Secondary | ICD-10-CM

## 2021-02-18 NOTE — Telephone Encounter (Signed)
Spoke with pt. Pt was notified of results. Pt was scheduled for 04/22/2021 @ 9:30 AM with Aliene Altes, PA. Lab orders placed.

## 2021-02-21 ENCOUNTER — Encounter (HOSPITAL_COMMUNITY): Payer: Self-pay | Admitting: Internal Medicine

## 2021-03-07 ENCOUNTER — Other Ambulatory Visit (INDEPENDENT_AMBULATORY_CARE_PROVIDER_SITE_OTHER): Payer: Self-pay | Admitting: Internal Medicine

## 2021-03-10 ENCOUNTER — Ambulatory Visit (INDEPENDENT_AMBULATORY_CARE_PROVIDER_SITE_OTHER): Payer: Medicare Other | Admitting: Nurse Practitioner

## 2021-03-10 ENCOUNTER — Other Ambulatory Visit: Payer: Self-pay

## 2021-03-10 VITALS — BP 136/80 | HR 86 | Temp 97.1°F | Ht <= 58 in | Wt 177.4 lb

## 2021-03-10 DIAGNOSIS — E119 Type 2 diabetes mellitus without complications: Secondary | ICD-10-CM | POA: Diagnosis not present

## 2021-03-10 DIAGNOSIS — D509 Iron deficiency anemia, unspecified: Secondary | ICD-10-CM

## 2021-03-10 DIAGNOSIS — I10 Essential (primary) hypertension: Secondary | ICD-10-CM

## 2021-03-10 DIAGNOSIS — R42 Dizziness and giddiness: Secondary | ICD-10-CM | POA: Diagnosis not present

## 2021-03-10 LAB — CBC
HCT: 31.5 % — ABNORMAL LOW (ref 35.0–45.0)
Hemoglobin: 10.2 g/dL — ABNORMAL LOW (ref 11.7–15.5)
MCH: 28.9 pg (ref 27.0–33.0)
MCHC: 32.4 g/dL (ref 32.0–36.0)
MCV: 89.2 fL (ref 80.0–100.0)
MPV: 9.9 fL (ref 7.5–12.5)
Platelets: 255 10*3/uL (ref 140–400)
RBC: 3.53 10*6/uL — ABNORMAL LOW (ref 3.80–5.10)
RDW: 13.8 % (ref 11.0–15.0)
WBC: 4.6 10*3/uL (ref 3.8–10.8)

## 2021-03-10 MED ORDER — SLOW FE 142 (45 FE) MG PO TBCR: 2.0000 | EXTENDED_RELEASE_TABLET | Freq: Every morning | ORAL | 1 refills | Status: DC

## 2021-03-10 NOTE — Progress Notes (Signed)
Subjective:  Patient ID: Kimberly Best, female    DOB: [birth year]  Age: 87 y.o. MRN: [id number]  CC:  Chief Complaint  Patient presents with  . Anemia  . Diabetes  . Hypertension  . Dizziness      HPI  This patient arrives today for the above.  Anemia: She has history of iron deficiency anemia.  She tells me she is already undergone colonoscopy, endoscopy and most recently capsule study for further evaluation.  Capsule study results did indicate some nonbleeding ulcers were noted and since this study has been completed the patient stopped her daily aspirin.  She is also avoiding NSAIDs currently.  Diabetes: Last A1c was 6.5.  She continues on Metformin daily.  She was positive for microalbuminuria last time her urine was checked and she is on ACE inhibitor.  Hypertension: She continues on lisinopril 2.5 mg daily.  She does not have an at home blood pressure cuff but plans on getting 1.  Dizziness: She has a fairly longstanding history of intermittent dizziness.  I do see back in 2020 she had a CT scan of the brain for evaluation of this which did not show any masses.  It did show age-related volume loss, and arteriovascular calcification.  She tells me that her dizziness comes and goes and seems to be triggered mostly when she is getting up out of bed.  She also notes that she is more prone to dizzy spells when she feels she is having sinus congestion.  With rest it will resolve on its own.  She takes Antivert as needed as well as Flonase nasal spray but tells me that her symptoms are not much improved with the use of these medicines.  Past Medical History:  Diagnosis Date  . Allergy   . Anxiety   . Arthritis   . Cancer [facility])    right breast cancer  . Diabetes mellitus without complication ([place])   . GERD (gastroesophageal reflux disease)   . H. pylori infection 05/2020   S/p treatment with Pylera.  H. pylori stool antigen negative on 12/21/2020.  Marland Kitchen Hypertension   . Pessary  maintenance 03/10/2015      Family History  Problem Relation Age of Onset  . Cancer Father   . Stroke Mother   . Hypertension Mother   . Diabetes Sister   . Hypertension Sister   . Cancer Brother   . Hypertension Brother   . Bipolar disorder Daughter   . Hypertension Maternal Grandmother   . Tuberculosis Paternal Grandmother   . Hypertension Paternal Grandmother   . Alcohol abuse Paternal Grandfather   . Hypertension Paternal Grandfather   . Colon cancer Neg Hx     Social History   Social History Narrative   Widow since 2015,married for 33 years.Lives alone.Retired.   Social History   Tobacco Use  . Smoking status: Never Smoker  . Smokeless tobacco: Never Used  Substance Use Topics  . Alcohol use: No     Current Meds  Medication Sig  . acetaminophen (TYLENOL) 650 MG CR tablet Take 650 mg by mouth 2 (two) times daily as needed for pain.  . cholecalciferol (VITAMIN D) 1000 UNITS tablet Take 1,000 Units by mouth daily.  . clotrimazole-betamethasone (LOTRISONE) cream Apply 1 application topically 2 (two) times daily.  . fexofenadine (ALLEGRA) 180 MG tablet Take 180 mg by mouth daily.  . fluticasone (FLONASE) 50 MCG/ACT nasal spray Place 2 sprays into both nostrils daily as needed for allergies  or rhinitis.  Marland Kitchen lisinopril (ZESTRIL) 2.5 MG tablet TAKE ONE TABLET (2.5MG  TOTAL) BY MOUTH DAILY  . meclizine (ANTIVERT) 25 MG tablet TAKE TWO TABLETS BY MOUTH FOUR TIMES A DAY AS NEEDED  . metFORMIN (GLUCOPHAGE) 500 MG tablet Take one with largest meal (Patient taking differently: Take 500 mg by mouth daily with supper.)  . Multiple Vitamins-Minerals (IMMUNE SUPPORT PO) Take 1 tablet by mouth daily.  . pantoprazole (PROTONIX) 40 MG tablet Take 1 tablet (40 mg total) by mouth daily before breakfast.  . [DISCONTINUED] aspirin EC 81 MG tablet Take 243 mg by mouth daily.   . [DISCONTINUED] SLOW FE 142 (45 Fe) MG TBCR Take 2 tablets by mouth every morning.    ROS:  Review of Systems   HENT: Positive for congestion.   Eyes: Negative for blurred vision.  Respiratory: Negative for shortness of breath.   Cardiovascular: Negative for chest pain.  Neurological: Positive for dizziness. Negative for headaches.     Objective:   Today's Vitals: BP 136/80 Comment: standing  Pulse 86   Temp (!) 97.1 F (36.2 C)   Ht 4\' 10"  (1.473 m)   Wt 177 lb 6.4 oz (80.5 kg)   SpO2 98%   BMI 37.08 kg/m  Vitals with BMI 03/10/2021 03/10/2021 03/10/2021  Height - - 4\' 10"   Weight - - 177 lbs 6 oz  BMI - - 62.94  Systolic 765 465 035  Diastolic 80 78 78  Pulse - - 86     Physical Exam Vitals reviewed.  Constitutional:      General: She is not in acute distress.    Appearance: Normal appearance.  HENT:     Head: Normocephalic and atraumatic.  Neck:     Vascular: No carotid bruit.  Cardiovascular:     Rate and Rhythm: Normal rate and regular rhythm.     Pulses: Normal pulses.     Heart sounds: Normal heart sounds.  Pulmonary:     Effort: Pulmonary effort is normal.     Breath sounds: Normal breath sounds.  Skin:    General: Skin is warm and dry.  Neurological:     General: No focal deficit present.     Mental Status: She is alert and oriented to person, place, and time.  Psychiatric:        Mood and Affect: Mood normal.        Behavior: Behavior normal.        Judgment: Judgment normal.          Assessment and Plan   1. Controlled type 2 diabetes mellitus without complication, without long-term current use of insulin ([place])   2. Iron deficiency anemia, unspecified iron deficiency anemia type   3. Hypertension, unspecified type   4. Dizziness      Plan: 1.  For now she will continue on her current treatment plan for diabetes. 2.  We will check CBC to monitor hemoglobin today.  She will follow up with gastroenterology as scheduled later in May. 3.  Blood pressure originally elevated but did improve upon resting, I feel she is at high risk for falls and do not  want to increase this risk by inadvertently leading to hypotension by increasing her blood pressure medicine.  We will continue to monitor closely and I did again encourage her to get an at home blood pressure cuff so she can monitor her blood pressure at home. 4.  Orthostatic vital signs were negative, she will try an as needed Allegra  to see if this helps with her symptoms.  I did warn her of risk for some sleepiness taking an over-the-counter allergy pill and that she needs to keep her phone on her person as much as possible in case she were to fall at home.  She is also encouraged to take her first dose of Allegra when she is at home does not have to go anywhere for a while to see how it makes her feel.  She expresses her understanding.   Tests ordered Orders Placed This Encounter  Procedures  . CBC      Meds ordered this encounter  Medications  . SLOW FE 142 (45 Fe) MG TBCR    Sig: Take 2 tablets by mouth every morning.    Dispense:  180 tablet    Refill:  1    Order Specific Question:   Supervising Provider    Answer:   Doree Albee [[id number]]    Patient to follow-up in 3 months or sooner as needed.  [name], NP

## 2021-03-10 NOTE — Patient Instructions (Signed)
Allegra - take one antiallergy pill daily as needed

## 2021-03-22 ENCOUNTER — Other Ambulatory Visit: Payer: Self-pay

## 2021-03-22 DIAGNOSIS — D509 Iron deficiency anemia, unspecified: Secondary | ICD-10-CM

## 2021-03-29 ENCOUNTER — Telehealth (INDEPENDENT_AMBULATORY_CARE_PROVIDER_SITE_OTHER): Payer: Self-pay

## 2021-03-29 DIAGNOSIS — D509 Iron deficiency anemia, unspecified: Secondary | ICD-10-CM

## 2021-03-29 MED ORDER — SLOW FE 142 (45 FE) MG PO TBCR: 2.0000 | EXTENDED_RELEASE_TABLET | Freq: Every morning | ORAL | 1 refills | Status: AC

## 2021-03-29 NOTE — Telephone Encounter (Signed)
Patient called and left a voice message stating that she needs the following prescription sent to [facility] because [place] has been out of this medication:  SLOW FE 142 (45 Fe) MG TBCR  Last sent to [facility] on 03/10/2021 but they have not had in stock.  Last OV 03/10/2021  Next OV 06/09/2021

## 2021-03-29 NOTE — Telephone Encounter (Signed)
Refill authorized and sent to [facility]

## 2021-04-14 ENCOUNTER — Other Ambulatory Visit (INDEPENDENT_AMBULATORY_CARE_PROVIDER_SITE_OTHER): Payer: Self-pay | Admitting: Internal Medicine

## 2021-04-14 DIAGNOSIS — K219 Gastro-esophageal reflux disease without esophagitis: Secondary | ICD-10-CM

## 2021-04-15 LAB — CBC WITH DIFFERENTIAL/PLATELET
Absolute Monocytes: 634 cells/uL (ref 200–950)
Basophils Absolute: 9 cells/uL (ref 0–200)
Basophils Relative: 0.2 %
Eosinophils Absolute: 62 cells/uL (ref 15–500)
Eosinophils Relative: 1.4 %
HCT: 30.6 % — ABNORMAL LOW (ref 35.0–45.0)
Hemoglobin: 10 g/dL — ABNORMAL LOW (ref 11.7–15.5)
Lymphs Abs: 1918 cells/uL (ref 850–3900)
MCH: 29.3 pg (ref 27.0–33.0)
MCHC: 32.7 g/dL (ref 32.0–36.0)
MCV: 89.7 fL (ref 80.0–100.0)
MPV: 9.7 fL (ref 7.5–12.5)
Monocytes Relative: 14.4 %
Neutro Abs: 1778 cells/uL (ref 1500–7800)
Neutrophils Relative %: 40.4 %
Platelets: 227 10*3/uL (ref 140–400)
RBC: 3.41 10*6/uL — ABNORMAL LOW (ref 3.80–5.10)
RDW: 13.6 % (ref 11.0–15.0)
Total Lymphocyte: 43.6 %
WBC: 4.4 10*3/uL (ref 3.8–10.8)

## 2021-04-15 LAB — IRON,TIBC AND FERRITIN PANEL
%SAT: 31 % (calc) (ref 16–45)
Ferritin: 57 ng/mL (ref 16–288)
Iron: 91 ug/dL (ref 45–160)
TIBC: 289 mcg/dL (calc) (ref 250–450)

## 2021-04-19 ENCOUNTER — Other Ambulatory Visit: Payer: Self-pay | Admitting: *Deleted

## 2021-04-19 DIAGNOSIS — D509 Iron deficiency anemia, unspecified: Secondary | ICD-10-CM

## 2021-04-22 ENCOUNTER — Ambulatory Visit: Payer: Medicare Other | Admitting: Gastroenterology

## 2021-05-10 ENCOUNTER — Other Ambulatory Visit (INDEPENDENT_AMBULATORY_CARE_PROVIDER_SITE_OTHER): Payer: Self-pay | Admitting: Internal Medicine

## 2021-05-10 DIAGNOSIS — E119 Type 2 diabetes mellitus without complications: Secondary | ICD-10-CM

## 2021-05-23 ENCOUNTER — Other Ambulatory Visit (INDEPENDENT_AMBULATORY_CARE_PROVIDER_SITE_OTHER): Payer: Self-pay | Admitting: Internal Medicine

## 2021-06-09 ENCOUNTER — Ambulatory Visit (INDEPENDENT_AMBULATORY_CARE_PROVIDER_SITE_OTHER): Payer: Medicare Other | Admitting: Nurse Practitioner

## 2021-06-09 ENCOUNTER — Encounter (INDEPENDENT_AMBULATORY_CARE_PROVIDER_SITE_OTHER): Payer: Self-pay | Admitting: Nurse Practitioner

## 2021-06-09 ENCOUNTER — Other Ambulatory Visit: Payer: Self-pay

## 2021-06-09 ENCOUNTER — Telehealth (INDEPENDENT_AMBULATORY_CARE_PROVIDER_SITE_OTHER): Payer: Self-pay | Admitting: Nurse Practitioner

## 2021-06-09 VITALS — BP 132/72 | HR 76 | Temp 97.3°F | Ht <= 58 in | Wt 177.2 lb

## 2021-06-09 DIAGNOSIS — M810 Age-related osteoporosis without current pathological fracture: Secondary | ICD-10-CM

## 2021-06-09 DIAGNOSIS — E785 Hyperlipidemia, unspecified: Secondary | ICD-10-CM

## 2021-06-09 DIAGNOSIS — R42 Dizziness and giddiness: Secondary | ICD-10-CM

## 2021-06-09 DIAGNOSIS — I10 Essential (primary) hypertension: Secondary | ICD-10-CM

## 2021-06-09 DIAGNOSIS — E119 Type 2 diabetes mellitus without complications: Secondary | ICD-10-CM

## 2021-06-09 NOTE — Progress Notes (Signed)
This encounter was created in error - please disregard.

## 2021-06-09 NOTE — Progress Notes (Signed)
Subjective:  Patient ID: Kimberly Best, female    DOB: 06-03-1933  Age: 87 y.o. MRN: [id number]  CC:  Chief Complaint  Patient presents with   Follow-up    Having vertigo and feeling woozy   Other    Dizziness, osteoporosis   Hyperlipidemia   Hypertension   Diabetes      HPI  This patient arrives today for the above.  Dizziness: She continues to have intermittent dizziness especially when she lays down.  She tells me the room seems like it spinning around her.  She denies any blurred vision, double vision, numbness or weakness to 1 side of the body.  At last office visit I recommended trying over-the-counter allergy pill such as Allegra but she tells me she never did start taking this.  She has taken meclizine in the past but does not seem to help her symptoms very much.  Osteoporosis: She continues on her vitamin D3 supplement.  Last serum vitamin D3 check has been over a year ago so she is due to have this checked today.  Hyperlipidemia: Last lipid panel was collected about 2 years ago.  LDL is 152 at that time.  She is due to have this checked today.  She remains off of any medication to control her cholesterol.  Hypertension: She is on low-dose lisinopril and is tolerating this well.  Diabetes: Last A1c was collected about 4 months ago and it was 6.5.  She continues on metformin.  She is due for foot exam today.  She is on ACE inhibitor, but is not on statin therapy.  Past Medical History:  Diagnosis Date   Allergy    Anxiety    Arthritis    Cancer ([place])    right breast cancer   Diabetes mellitus without complication ([place])    GERD (gastroesophageal reflux disease)    H. pylori infection 05/2020   S/p treatment with Pylera.  H. pylori stool antigen negative on 12/21/2020.   Hypertension    Pessary maintenance 03/10/2015      Family History  Problem Relation Age of Onset   Cancer Father    Stroke Mother    Hypertension Mother    Diabetes Sister     Hypertension Sister    Cancer Brother    Hypertension Brother    Bipolar disorder Daughter    Hypertension Maternal Grandmother    Tuberculosis Paternal Grandmother    Hypertension Paternal Grandmother    Alcohol abuse Paternal Grandfather    Hypertension Paternal Grandfather    Colon cancer Neg Hx     Social History   Social History Narrative   Widow since 2015,married for 14 years.Lives alone.Retired.   Social History   Tobacco Use   Smoking status: Never   Smokeless tobacco: Never  Substance Use Topics   Alcohol use: No     Current Meds  Medication Sig   ACCU-CHEK AVIVA PLUS test strip USE AS DIRECTED TO TEST TWICE DAILY.   Accu-Chek Softclix Lancets lancets USE AS DIRECTED TO TEST TWICE DAILY.   acetaminophen (TYLENOL) 650 MG CR tablet Take 650 mg by mouth 2 (two) times daily as needed for pain.   cholecalciferol (VITAMIN D) 1000 UNITS tablet Take 1,000 Units by mouth daily.   clotrimazole-betamethasone (LOTRISONE) cream Apply 1 application topically 2 (two) times daily.   fexofenadine (ALLEGRA) 180 MG tablet Take 180 mg by mouth daily.   fluticasone (FLONASE) 50 MCG/ACT nasal spray Place 2 sprays into both nostrils daily  as needed for allergies or rhinitis.   lisinopril (ZESTRIL) 2.5 MG tablet TAKE ONE TABLET (2.5MG TOTAL) BY MOUTH DAILY   meclizine (ANTIVERT) 25 MG tablet TAKE TWO TABLETS BY MOUTH FOUR TIMES A DAY AS NEEDED   metFORMIN (GLUCOPHAGE) 500 MG tablet Take one with largest meal (Patient taking differently: Take 500 mg by mouth daily with supper.)   Multiple Vitamins-Minerals (IMMUNE SUPPORT PO) Take 1 tablet by mouth daily.   pantoprazole (PROTONIX) 40 MG tablet TAKE ONE TABLET (40MG TOTAL) BY MOUTH DAILY BEFORE BREAKFAST   SLOW FE 142 (45 Fe) MG TBCR Take 2 tablets by mouth every morning.    ROS:  Review of Systems  Eyes:  Negative for blurred vision and double vision.  Respiratory:  Negative for shortness of breath.   Cardiovascular:  Negative for  chest pain.  Neurological:  Positive for dizziness. Negative for headaches.    Objective:   Today's Vitals: BP 132/72   Pulse 76   Temp (!) 97.3 F (36.3 C) (Temporal)   Ht _0  (1.473 m)   Wt 177 lb 3.2 oz (80.4 kg)   SpO2 91%   BMI 37.03 kg/m  Vitals with BMI 06/09/2021 03/10/2021 03/10/2021  Height _1  - -  Weight 177 lbs 3 oz - -  BMI 93.57 - -  Systolic 017 793 903  Diastolic 72 80 78  Pulse 76 - -     Physical Exam Vitals reviewed.  Constitutional:      General: She is not in acute distress.    Appearance: Normal appearance.  HENT:     Head: Normocephalic and atraumatic.  Neck:     Vascular: No carotid bruit.  Cardiovascular:     Rate and Rhythm: Normal rate and regular rhythm.     Pulses: Normal pulses.          Dorsalis pedis pulses are 2+ on the right side and 2+ on the left side.     Heart sounds: Normal heart sounds.  Pulmonary:     Effort: Pulmonary effort is normal.     Breath sounds: Normal breath sounds.  Musculoskeletal:     Right foot: No deformity.     Left foot: No deformity.  Feet:     Right foot:     Protective Sensation: 10 sites tested.  10 sites sensed.     Skin integrity: Skin integrity normal.     Toenail Condition: Right toenails are normal.     Left foot:     Protective Sensation: 10 sites tested.  10 sites sensed.     Skin integrity: Skin integrity normal.     Toenail Condition: Left toenails are normal.  Skin:    General: Skin is warm and dry.  Neurological:     General: No focal deficit present.     Mental Status: She is alert and oriented to person, place, and time.  Psychiatric:        Mood and Affect: Mood normal.        Behavior: Behavior normal.        Judgment: Judgment normal.         Assessment and Plan   1. Dizziness   2. Hypertension, unspecified type   3. Controlled type 2 diabetes mellitus without complication, without long-term current use of insulin ([place])   4. Hyperlipidemia, unspecified  hyperlipidemia type   5. Age-related osteoporosis without current pathological fracture      Plan: 1.  I again recommended she try over-the-counter  Allegra.  I will also refer her to ENT for further assistance with evaluation and management. 2.  She will continue on her lisinopril as prescribed. 3.  We will check A1c, and she will continue on her metformin for now.  Foot exam completed today. 4.  We will check lipid panel today. 5. She will continue on her vitamin D3 supplement and I will check vitamin D level and CMP today.   Tests ordered Orders Placed This Encounter  Procedures   Hemoglobin A1c   Lipid Panel   CMP with eGFR([facility])   Vitamin D, 25-hydroxy   Ambulatory referral to ENT      No orders of the defined types were placed in this encounter.   Patient to follow-up in 6 weeks or sooner as needed.  [name], NP

## 2021-06-09 NOTE — Patient Instructions (Signed)
Try over the counter Allegra (1 tablet by mouth daily) to see if this helps with the dizziness.

## 2021-06-09 NOTE — Telephone Encounter (Signed)
I received a message that this patient wants me to order her home health services through [facility].  Phone number [phone].  I tried to call the patient's home at [phone] to find out exactly what services she needs.  She did not answer the phone and I was unable to leave voicemail.  When you have time we try to call her back to see what home health services she is requesting and the reason why she is requesting it thank you.

## 2021-06-10 LAB — COMPLETE METABOLIC PANEL WITH GFR
AG Ratio: 1.5 (calc) (ref 1.0–2.5)
ALT: 7 U/L (ref 6–29)
AST: 17 U/L (ref 10–35)
Albumin: 4 g/dL (ref 3.6–5.1)
Alkaline phosphatase (APISO): 49 U/L (ref 37–153)
BUN: 14 mg/dL (ref 7–25)
CO2: 27 mmol/L (ref 20–32)
Calcium: 9.1 mg/dL (ref 8.6–10.4)
Chloride: 106 mmol/L (ref 98–110)
Creat: 0.78 mg/dL (ref 0.60–0.88)
GFR, Est African American: 80 mL/min/{1.73_m2} (ref >=60)
GFR, Est Non African American: 69 mL/min/{1.73_m2} (ref >=60)
Globulin: 2.7 g/dL (calc) (ref 1.9–3.7)
Glucose, Bld: 128 mg/dL — ABNORMAL HIGH (ref 65–99)
Potassium: 4.1 mmol/L (ref 3.5–5.3)
Sodium: 141 mmol/L (ref 135–146)
Total Bilirubin: 0.6 mg/dL (ref 0.2–1.2)
Total Protein: 6.7 g/dL (ref 6.1–8.1)

## 2021-06-10 LAB — VITAMIN D 25 HYDROXY (VIT D DEFICIENCY, FRACTURES): Vit D, 25-Hydroxy: 36 ng/mL (ref 30–100)

## 2021-06-10 LAB — HEMOGLOBIN A1C
Hgb A1c MFr Bld: 6.3 % of total Hgb — ABNORMAL HIGH (ref <5.7)
Mean Plasma Glucose: 134 mg/dL
eAG (mmol/L): 7.4 mmol/L

## 2021-06-10 LAB — LIPID PANEL
Cholesterol: 192 mg/dL (ref <200)
HDL: 48 mg/dL — ABNORMAL LOW (ref >=50)
LDL Cholesterol (Calc): 118 mg/dL (calc) — ABNORMAL HIGH
Non-HDL Cholesterol (Calc): 144 mg/dL (calc) — ABNORMAL HIGH (ref <130)
Total CHOL/HDL Ratio: 4 (calc) (ref <5.0)
Triglycerides: 151 mg/dL — ABNORMAL HIGH (ref <150)

## 2021-06-14 ENCOUNTER — Ambulatory Visit (INDEPENDENT_AMBULATORY_CARE_PROVIDER_SITE_OTHER): Payer: Medicare Other | Admitting: Nurse Practitioner

## 2021-06-15 ENCOUNTER — Encounter (INDEPENDENT_AMBULATORY_CARE_PROVIDER_SITE_OTHER): Payer: Self-pay | Admitting: Nurse Practitioner

## 2021-06-15 ENCOUNTER — Ambulatory Visit (INDEPENDENT_AMBULATORY_CARE_PROVIDER_SITE_OTHER): Payer: Medicare Other | Admitting: Nurse Practitioner

## 2021-06-15 ENCOUNTER — Other Ambulatory Visit: Payer: Self-pay

## 2021-06-15 VITALS — BP 138/70 | HR 90 | Temp 97.3°F | Ht <= 58 in | Wt 177.6 lb

## 2021-06-15 DIAGNOSIS — Z0001 Encounter for general adult medical examination with abnormal findings: Secondary | ICD-10-CM | POA: Diagnosis not present

## 2021-06-15 DIAGNOSIS — E785 Hyperlipidemia, unspecified: Secondary | ICD-10-CM | POA: Diagnosis not present

## 2021-06-15 DIAGNOSIS — E119 Type 2 diabetes mellitus without complications: Secondary | ICD-10-CM | POA: Diagnosis not present

## 2021-06-15 DIAGNOSIS — Z0000 Encounter for general adult medical examination without abnormal findings: Secondary | ICD-10-CM

## 2021-06-15 NOTE — Telephone Encounter (Signed)
Called patient and she stated that her washer and dryer broke and she is having to go to the laundry mat to wash and dry her clothes and she is having a hard time getting her clothes in and out of the car, she needs help making her bed, and she also needs someone to wash her back and feet really good as she is not able to reach these areas.

## 2021-06-15 NOTE — Patient Instructions (Signed)
  Kimberly Best , Thank you for taking time to come for your Medicare Wellness Visit. I appreciate your ongoing commitment to your health goals. Please review the following plan we discussed and let me know if I can assist you in the future.   These are the goals we discussed:  Goals      Prevent falls        This is a list of the screening recommended for you and due dates:  Health Maintenance  Topic Date Due   Eye exam for diabetics  06/08/2020   COVID-19 Vaccine (1) 07/01/2021*   Zoster (Shingles) Vaccine (1 of 2) 09/15/2021*   Flu Shot  07/11/2021   Hemoglobin A1C  12/09/2021   Complete foot exam   06/09/2022   Tetanus Vaccine  06/16/2030   DEXA scan (bone density measurement)  Completed   Pneumonia vaccines  Completed   HPV Vaccine  Aged Out  *Topic was postponed. The date shown is not the original due date.

## 2021-06-15 NOTE — Progress Notes (Signed)
Subjective:   Kimberly Best is a 87 y.o. female who presents for Medicare Annual (Subsequent) preventive examination.  Review of Systems     Cardiac Risk Factors include: advanced age (>87men, >67 women);diabetes mellitus;dyslipidemia;hypertension;obesity (BMI >30kg/m2)     Objective:    Today's Vitals   06/15/21 1000  BP: 138/70  Pulse: 90  Temp: (!) 97.3 F (36.3 C)  TempSrc: Temporal  SpO2: 96%  Weight: 177 lb 9.6 oz (80.6 kg)  Height: 4\' 6"  (1.372 m)   Body mass index is 42.82 kg/m.  Advanced Directives 06/15/2021 06/04/2020 05/26/2020 04/01/2020 04/01/2020 09/03/2018 08/30/2018  Does Patient Have a Medical Advance Directive? No No No No No No No  Would patient like information on creating a medical advance directive? Yes (MAU/Ambulatory/Procedural Areas - Information given) No - Patient declined No - Patient declined No - Patient declined No - Patient declined No - Patient declined No - Patient declined    Current Medications (verified) Outpatient Encounter Medications as of 06/15/2021  Medication Sig   ACCU-CHEK AVIVA PLUS test strip USE AS DIRECTED TO TEST TWICE DAILY.   Accu-Chek Softclix Lancets lancets USE AS DIRECTED TO TEST TWICE DAILY.   acetaminophen (TYLENOL) 650 MG CR tablet Take 650 mg by mouth 2 (two) times daily as needed for pain.   cholecalciferol (VITAMIN D) 1000 UNITS tablet Take 1,000 Units by mouth daily.   clotrimazole-betamethasone (LOTRISONE) cream Apply 1 application topically 2 (two) times daily.   fexofenadine (ALLEGRA) 180 MG tablet Take 180 mg by mouth daily.   fluticasone (FLONASE) 50 MCG/ACT nasal spray Place 2 sprays into both nostrils daily as needed for allergies or rhinitis.   lisinopril (ZESTRIL) 2.5 MG tablet TAKE ONE TABLET (2.5MG  TOTAL) BY MOUTH DAILY   meclizine (ANTIVERT) 25 MG tablet TAKE TWO TABLETS BY MOUTH FOUR TIMES A DAY AS NEEDED   metFORMIN (GLUCOPHAGE) 500 MG tablet Take one with largest meal (Patient taking differently: Take  500 mg by mouth daily with supper.)   Multiple Vitamins-Minerals (IMMUNE SUPPORT PO) Take 1 tablet by mouth daily.   pantoprazole (PROTONIX) 40 MG tablet TAKE ONE TABLET (40MG  TOTAL) BY MOUTH DAILY BEFORE BREAKFAST   SLOW FE 142 (45 Fe) MG TBCR Take 2 tablets by mouth every morning.   No facility-administered encounter medications on file as of 06/15/2021.    Allergies (verified) Penicillins   History: Past Medical History:  Diagnosis Date   Allergy    Anxiety    Arthritis    Cancer ([place])    right breast cancer   Diabetes mellitus without complication (HCC)    GERD (gastroesophageal reflux disease)    H. pylori infection 05/2020   S/p treatment with Pylera.  H. pylori stool antigen negative on 12/21/2020.   Hypertension    Pessary maintenance 03/10/2015   Past Surgical History:  Procedure Laterality Date   BIOPSY  05/26/2020   Procedure: BIOPSY;  Surgeon: Daneil Dolin, MD;  [facility];  Service: Endoscopy;;   BREAST SURGERY Right    lumpectomy    CHOLECYSTECTOMY     COLONOSCOPY N/A 05/26/2020   Procedure: COLONOSCOPY;  Surgeon: Daneil Dolin, MD; Diverticulosis in sigmoid colon, friable colonic mucosa likely secondary to ASA/NSAID insult, exam was otherwise normal.     ESOPHAGOGASTRODUODENOSCOPY N/A 05/26/2020   Procedure: ESOPHAGOGASTRODUODENOSCOPY (EGD);  Surgeon: Daneil Dolin, MD;  Normal esophagus s/p dilation, mucosal changes in the stomach biopsied, normal examined duodenum.  Pathology revealed H. pylori.   EYE SURGERY  GIVENS CAPSULE STUDY N/A 02/16/2021   Procedure: GIVENS CAPSULE STUDY;  Surgeon: Daneil Dolin, MD;  [facility];  Service: Endoscopy;  Laterality: N/A;  7:30am   Family History  Problem Relation Age of Onset   Cancer Father    Stroke Mother    Hypertension Mother    Diabetes Sister    Hypertension Sister    Cancer Brother    Hypertension Brother    Bipolar disorder Daughter    Hypertension Maternal Grandmother     Tuberculosis Paternal Grandmother    Hypertension Paternal Grandmother    Alcohol abuse Paternal Grandfather    Hypertension Paternal Grandfather    Colon cancer Neg Hx    Social History   Socioeconomic History   Marital status: Married    Spouse name: Not on file   Number of children: Not on file   Years of education: Not on file   Highest education level: Not on file  Occupational History   Not on file  Tobacco Use   Smoking status: Never   Smokeless tobacco: Never  Vaping Use   Vaping Use: Never used  Substance and Sexual Activity   Alcohol use: No   Drug use: No   Sexual activity: Not Currently    Birth control/protection: Post-menopausal  Other Topics Concern   Not on file  Social History Narrative   Widow since 2015,married for 56 years.Lives alone.Retired.   Social Determinants of Health   Financial Resource Strain: Not on file  Food Insecurity: Not on file  Transportation Needs: Not on file  Physical Activity: Not on file  Stress: Not on file  Social Connections: Not on file    Tobacco Counseling Counseling given: Yes   Clinical Intake:  Pre-visit preparation completed: Yes  Pain : No/denies pain     BMI - recorded: 42.82 Nutritional Status: BMI > 30  Obese Nutritional Risks: None Diabetes: Yes CBG done?: No Did pt. bring in CBG monitor from home?: No (110)  How often do you need to have someone help you when you read instructions, pamphlets, or other written materials from your doctor or pharmacy?: 1 - Never What is the last grade level you completed in school?: 12th grade  Diabetic?yes  Interpreter Needed?: No  Information entered by :: Jeralyn Ruths, NP-C   Activities of Daily Living In your present state of health, do you have any difficulty performing the following activities: 06/15/2021  Hearing? Y  Vision? Y  Difficulty concentrating or making decisions? Y  Walking or climbing stairs? Y  Dressing or bathing? N  Doing errands,  shopping? N  Preparing Food and eating ? N  Using the Toilet? N  In the past six months, have you accidently leaked urine? Y  Do you have problems with loss of bowel control? N  Managing your Medications? N  Managing your Finances? N  Housekeeping or managing your Housekeeping? N  Some recent data might be hidden    Patient Care Team: [name], NP as PCP - General (Nurse Practitioner) Daneil Dolin, MD as Consulting Physician (Gastroenterology)  Indicate any recent Medical Services you may have received from other than [facility] providers in the past year (date may be approximate).     Assessment:   This is a routine wellness examination for Juanitta.  Hearing/Vision screen No results found.  Dietary issues and exercise activities discussed: Current Exercise Habits: Home exercise routine, Type of exercise: walking;strength training/weights, Time (Minutes): 15, Frequency (Times/Week): 7, Weekly  Exercise (Minutes/Week): 105, Intensity: Mild, Exercise limited by: orthopedic condition(s)   Goals Addressed   None    Depression Screen PHQ 2/9 Scores 06/15/2021 06/09/2021 06/04/2020 05/05/2020 03/22/2020 12/17/2019 10/15/2015  PHQ - 2 Score 0 0 0 0 0 0 0  PHQ- 9 Score 0 0 - - - - -  Exception Documentation - - - Medical reason - - -    Fall Risk Fall Risk  06/15/2021 06/09/2021 11/18/2020 06/04/2020 05/05/2020  Falls in the past year? 0 0 0 1 0  Number falls in past yr: - - - 0 0  Injury with Fall? - - - 0 0  Risk for fall due to : - - - History of fall(s) No Fall Risks  Follow up - - - Falls evaluation completed;Education provided;Falls prevention discussed Falls evaluation completed    FALL RISK PREVENTION PERTAINING TO THE HOME:  Any stairs in or around the home? Yes  If so, are there any without handrails? No  Home free of loose throw rugs in walkways, pet beds, electrical cords, etc? Yes  Adequate lighting in your home to reduce risk of falls? Yes   ASSISTIVE DEVICES UTILIZED TO  PREVENT FALLS:  Life alert? No  Use of a cane, walker or w/c? Yes  Grab bars in the bathroom? Yes  Shower chair or bench in shower? Yes  Elevated toilet seat or a handicapped toilet? Yes   TIMED UP AND GO:  Was the test performed? No .  Length of time to ambulate 10 feet: 25 sec.   Gait slow and steady with assistive device  Cognitive Function:     6CIT Screen 06/15/2021 06/04/2020  What Year? 0 points 0 points  What month? 0 points 0 points  What time? 0 points 0 points  Count back from 20 2 points 2 points  Months in reverse 0 points 0 points  Repeat phrase 2 points 8 points  Total Score 4 10    Immunizations Immunization History  Administered Date(s) Administered   Influenza, High Dose Seasonal PF 09/21/2020   Influenza-Unspecified 09/27/2016, 09/30/2018, 08/21/2019   Pneumococcal Conjugate-13 08/18/2014   Pneumococcal Polysaccharide-23 08/24/2015   Tdap 06/16/2020    TDAP status: Up to date  Flu Vaccine status: Up to date  Pneumococcal vaccine status: Up to date  Covid-19 vaccine status: Declined, Education has been provided regarding the importance of this vaccine but patient still declined. Advised may receive this vaccine at local pharmacy or Health Dept.or vaccine clinic. Aware to provide a copy of the vaccination record if obtained from local pharmacy or Health Dept. Verbalized acceptance and understanding.  Qualifies for Shingles Vaccine? Yes   Zostavax completed No   Shingrix Completed?: No.    Education has been provided regarding the importance of this vaccine. Patient has been advised to call insurance company to determine out of pocket expense if they have not yet received this vaccine. Advised may also receive vaccine at local pharmacy or Health Dept. Verbalized acceptance and understanding.  Screening Tests Health Maintenance  Topic Date Due   COVID-19 Vaccine (1) Never done   Zoster Vaccines- Shingrix (1 of 2) Never done   OPHTHALMOLOGY EXAM   06/08/2020   INFLUENZA VACCINE  07/11/2021   HEMOGLOBIN A1C  12/09/2021   FOOT EXAM  06/09/2022   TETANUS/TDAP  06/16/2030   DEXA SCAN  Completed   PNA vac Low Risk Adult  Completed   HPV VACCINES  Aged Out    Health Maintenance  Health Maintenance  Due  Topic Date Due   COVID-19 Vaccine (1) Never done   Zoster Vaccines- Shingrix (1 of 2) Never done   OPHTHALMOLOGY EXAM  06/08/2020    Colorectal cancer screening: No longer required.   Mammogram status: Completed 09/2020. Repeat every year  Bone Density status: Completed 09/2020. Results reflect: Bone density results: OSTEOPENIA. Repeat every 2 years.  Lung Cancer Screening: (Low Dose CT Chest recommended if Age 58-80 years, 30 pack-year currently smoking OR have quit w/in 15years.) does not qualify.   Lung Cancer Screening Referral: N/A  Additional Screening:  Hepatitis C Screening: does qualify; Completed N/A per patient preference  Vision Screening: Recommended annual ophthalmology exams for early detection of glaucoma and other disorders of the eye. Is the patient up to date with their annual eye exam?  No  Who is the provider or what is the name of the office in which the patient attends annual eye exams? Dr. Katy Fitch -- patient had to reschedule her upcoming appointment for this August If pt is not established with a provider, would they like to be referred to a provider to establish care? No .   Dental Screening: Recommended annual dental exams for proper oral hygiene  Community Resource Referral / Chronic Care Management: CRR required this visit?  No   CCM required this visit?  No      Plan:  Patient is up-to-date with screenings recommended for her that she is willing to undergo at this time.  She is also unwilling to have COVID-19 vaccine this year as well as shingles vaccine administered.  She was encouraged let me know if changes her mind.  I have personally reviewed and noted the following in the patient's  chart:   Medical and social history Use of alcohol, tobacco or illicit drugs  Current medications and supplements including opioid prescriptions.  Functional ability and status Nutritional status Physical activity Advanced directives List of other physicians Hospitalizations, surgeries, and ER visits in previous 12 months Vitals Screenings to include cognitive, depression, and falls Referrals and appointments  In addition, I have reviewed and discussed with patient certain preventive protocols, quality metrics, and best practice recommendations. A written personalized care plan for preventive services as well as general preventive health recommendations were provided to patient.   She will follow-up as scheduled next month or sooner as needed.  [name], NP   06/15/2021

## 2021-06-15 NOTE — Telephone Encounter (Signed)
Okay, I will have to discuss with her in person at her next follow-up. I will leave a note to myself as a reminder.

## 2021-06-17 ENCOUNTER — Other Ambulatory Visit: Payer: Self-pay

## 2021-06-17 DIAGNOSIS — K257 Chronic gastric ulcer without hemorrhage or perforation: Secondary | ICD-10-CM

## 2021-06-17 DIAGNOSIS — K219 Gastro-esophageal reflux disease without esophagitis: Secondary | ICD-10-CM

## 2021-06-17 NOTE — Progress Notes (Signed)
error 

## 2021-06-20 ENCOUNTER — Ambulatory Visit: Payer: Medicare Other | Admitting: Obstetrics & Gynecology

## 2021-06-20 ENCOUNTER — Encounter: Payer: Self-pay | Admitting: Obstetrics & Gynecology

## 2021-06-20 ENCOUNTER — Other Ambulatory Visit: Payer: Self-pay

## 2021-06-20 VITALS — BP 135/74 | HR 77 | Ht <= 58 in | Wt 174.5 lb

## 2021-06-20 DIAGNOSIS — N813 Complete uterovaginal prolapse: Secondary | ICD-10-CM | POA: Diagnosis not present

## 2021-06-20 DIAGNOSIS — Z4689 Encounter for fitting and adjustment of other specified devices: Secondary | ICD-10-CM

## 2021-06-20 NOTE — Progress Notes (Signed)
Chief Complaint  Patient presents with   Pessary Check    Blood pressure 135/74, pulse 77, height 4\' 6"  (1.372 m), weight 174 lb 8 oz (79.2 kg).  Kimberly Best presents today for routine follow up related to her pessary.   She uses a Milex ring with support #5 She reports no vaginal discharge and no vaginal bleeding   Likert scale(1 not bothersome -5 very bothersome)  :  1  Exam reveals no undue vaginal mucosal pressure of breakdown, no discharge and no vaginal bleeding.  Vaginal Epithelial Abnormality Classification System:   0 0    No abnormalities 1    Epithelial erythema 2    Granulation tissue 3    Epithelial break or erosion, 1 cm or less 4    Epithelial break or erosion, 1 cm or greater  The pessary is removed, cleaned and replaced without difficulty.      ICD-10-CM   1. Pessary maintenance, refit 10/18/20 with Milex ring with support #5  Z46.89     2. Uterovaginal prolapse, complete  N81.3        Kimberly Best will be sen back in 4 months for continued follow up.  Florian Buff, MD  06/20/2021 9:26 AM

## 2021-06-27 DIAGNOSIS — K257 Chronic gastric ulcer without hemorrhage or perforation: Secondary | ICD-10-CM | POA: Diagnosis not present

## 2021-06-27 DIAGNOSIS — K219 Gastro-esophageal reflux disease without esophagitis: Secondary | ICD-10-CM | POA: Diagnosis not present

## 2021-06-27 LAB — CBC WITH DIFFERENTIAL/PLATELET
Absolute Monocytes: 846 cells/uL (ref 200–950)
Basophils Absolute: 12 cells/uL (ref 0–200)
Basophils Relative: 0.2 %
Eosinophils Absolute: 72 cells/uL (ref 15–500)
Eosinophils Relative: 1.2 %
HCT: 32.1 % — ABNORMAL LOW (ref 35.0–45.0)
Hemoglobin: 10.4 g/dL — ABNORMAL LOW (ref 11.7–15.5)
Lymphs Abs: 2280 cells/uL (ref 850–3900)
MCH: 28.7 pg (ref 27.0–33.0)
MCHC: 32.4 g/dL (ref 32.0–36.0)
MCV: 88.4 fL (ref 80.0–100.0)
MPV: 10.1 fL (ref 7.5–12.5)
Monocytes Relative: 14.1 %
Neutro Abs: 2790 cells/uL (ref 1500–7800)
Neutrophils Relative %: 46.5 %
Platelets: 287 10*3/uL (ref 140–400)
RBC: 3.63 10*6/uL — ABNORMAL LOW (ref 3.80–5.10)
RDW: 14.4 % (ref 11.0–15.0)
Total Lymphocyte: 38 %
WBC: 6 10*3/uL (ref 3.8–10.8)

## 2021-06-27 LAB — IRON,TIBC AND FERRITIN PANEL
%SAT: 17 % (calc) (ref 16–45)
Ferritin: 86 ng/mL (ref 16–288)
Iron: 46 ug/dL (ref 45–160)
TIBC: 275 mcg/dL (calc) (ref 250–450)

## 2021-06-28 ENCOUNTER — Encounter (HOSPITAL_COMMUNITY): Payer: Self-pay

## 2021-06-28 ENCOUNTER — Telehealth (INDEPENDENT_AMBULATORY_CARE_PROVIDER_SITE_OTHER): Payer: Self-pay

## 2021-06-28 ENCOUNTER — Emergency Department (HOSPITAL_COMMUNITY)
Admission: EM | Admit: 2021-06-28 | Discharge: 2021-06-29 | Disposition: A | Discharge status: Home / Self Care | Payer: Medicare Other | Attending: Emergency Medicine | Admitting: Emergency Medicine

## 2021-06-28 ENCOUNTER — Other Ambulatory Visit: Payer: Self-pay

## 2021-06-28 ENCOUNTER — Emergency Department (HOSPITAL_COMMUNITY): Payer: Medicare Other

## 2021-06-28 DIAGNOSIS — I499 Cardiac arrhythmia, unspecified: Secondary | ICD-10-CM | POA: Diagnosis not present

## 2021-06-28 DIAGNOSIS — I10 Essential (primary) hypertension: Secondary | ICD-10-CM | POA: Diagnosis not present

## 2021-06-28 DIAGNOSIS — R531 Weakness: Secondary | ICD-10-CM

## 2021-06-28 DIAGNOSIS — U071 COVID-19: Secondary | ICD-10-CM | POA: Insufficient documentation

## 2021-06-28 DIAGNOSIS — Z853 Personal history of malignant neoplasm of breast: Secondary | ICD-10-CM | POA: Insufficient documentation

## 2021-06-28 DIAGNOSIS — Z7984 Long term (current) use of oral hypoglycemic drugs: Secondary | ICD-10-CM | POA: Diagnosis not present

## 2021-06-28 DIAGNOSIS — Z79899 Other long term (current) drug therapy: Secondary | ICD-10-CM | POA: Diagnosis not present

## 2021-06-28 DIAGNOSIS — R42 Dizziness and giddiness: Secondary | ICD-10-CM

## 2021-06-28 DIAGNOSIS — R509 Fever, unspecified: Secondary | ICD-10-CM | POA: Diagnosis not present

## 2021-06-28 DIAGNOSIS — Z743 Need for continuous supervision: Secondary | ICD-10-CM | POA: Diagnosis not present

## 2021-06-28 DIAGNOSIS — E119 Type 2 diabetes mellitus without complications: Secondary | ICD-10-CM | POA: Insufficient documentation

## 2021-06-28 DIAGNOSIS — R000 Tachycardia, unspecified: Secondary | ICD-10-CM | POA: Diagnosis not present

## 2021-06-28 DIAGNOSIS — R404 Transient alteration of awareness: Secondary | ICD-10-CM | POA: Diagnosis not present

## 2021-06-28 LAB — COMPREHENSIVE METABOLIC PANEL
ALT: 13 U/L (ref 0–44)
AST: 24 U/L (ref 15–41)
Albumin: 4.4 g/dL (ref 3.5–5.0)
Alkaline Phosphatase: 55 U/L (ref 38–126)
Anion gap: 12 (ref 5–15)
BUN: 17 mg/dL (ref 8–23)
CO2: 24 mmol/L (ref 22–32)
Calcium: 9.3 mg/dL (ref 8.9–10.3)
Chloride: 98 mmol/L (ref 98–111)
Creatinine, Ser: 1.02 mg/dL — ABNORMAL HIGH (ref 0.44–1.00)
GFR, Estimated: 54 mL/min — ABNORMAL LOW (ref >60)
Glucose, Bld: 186 mg/dL — ABNORMAL HIGH (ref 70–99)
Potassium: 4.2 mmol/L (ref 3.5–5.1)
Sodium: 134 mmol/L — ABNORMAL LOW (ref 135–145)
Total Bilirubin: 0.9 mg/dL (ref 0.3–1.2)
Total Protein: 8.2 g/dL — ABNORMAL HIGH (ref 6.5–8.1)

## 2021-06-28 LAB — TROPONIN I (HIGH SENSITIVITY): Troponin I (High Sensitivity): 4 ng/L (ref <18)

## 2021-06-28 LAB — CBC WITH DIFFERENTIAL/PLATELET
Abs Immature Granulocytes: 0.07 10*3/uL (ref 0.00–0.07)
Basophils Absolute: 0 10*3/uL (ref 0.0–0.1)
Basophils Relative: 0 %
Eosinophils Absolute: 0 10*3/uL (ref 0.0–0.5)
Eosinophils Relative: 0 %
HCT: 32.3 % — ABNORMAL LOW (ref 36.0–46.0)
Hemoglobin: 10.5 g/dL — ABNORMAL LOW (ref 12.0–15.0)
Immature Granulocytes: 1 %
Lymphocytes Relative: 10 %
Lymphs Abs: 0.8 10*3/uL (ref 0.7–4.0)
MCH: 29.4 pg (ref 26.0–34.0)
MCHC: 32.5 g/dL (ref 30.0–36.0)
MCV: 90.5 fL (ref 80.0–100.0)
Monocytes Absolute: 1 10*3/uL (ref 0.1–1.0)
Monocytes Relative: 12 %
Neutro Abs: 6.6 10*3/uL (ref 1.7–7.7)
Neutrophils Relative %: 77 %
Platelets: 273 10*3/uL (ref 150–400)
RBC: 3.57 MIL/uL — ABNORMAL LOW (ref 3.87–5.11)
RDW: 14.9 % (ref 11.5–15.5)
WBC: 8.5 10*3/uL (ref 4.0–10.5)
nRBC: 0 % (ref 0.0–0.2)

## 2021-06-28 MED ORDER — SODIUM CHLORIDE 0.9 % IV BOLUS
500.0000 mL | Freq: Once | INTRAVENOUS | Status: AC
  Administered 2021-06-28: 500 mL via INTRAVENOUS

## 2021-06-28 MED ORDER — MECLIZINE HCL 25 MG PO TABS: 25.0000 mg | ORAL_TABLET | Freq: Three times a day (TID) | ORAL | 1 refills | Status: DC | PRN

## 2021-06-28 NOTE — ED Provider Notes (Signed)
[facility] Provider Note   CSN: [id number] Arrival date & time: 06/28/21  2117     History Chief Complaint  Patient presents with   Weakness    Kimberly Best is a 87 y.o. female.  Patient is an 87 year old female with past medical history of diabetes, GERD, hypertension, breast cancer.  Patient presenting today for evaluation of weakness.  She woke up this morning feeling well, then later this morning developed weakness in her legs and felt off balance.  She denies any fevers or chills.  She denies any cough.  She denies any nausea, vomiting, or diarrhea.  She denies any urinary symptoms.  She denies any ill contacts.  The history is provided by the patient.  Weakness Severity:  Moderate Onset quality:  Sudden Duration:  12 hours Timing:  Constant Progression:  Unchanged Chronicity:  New Relieved by:  Nothing Worsened by:  Nothing Ineffective treatments:  None tried Associated symptoms: no dysuria       Past Medical History:  Diagnosis Date   Allergy    Anxiety    Arthritis    Cancer ([place])    right breast cancer   Diabetes mellitus without complication (HCC)    GERD (gastroesophageal reflux disease)    H. pylori infection 05/2020   S/p treatment with Pylera.  H. pylori stool antigen negative on 12/21/2020.   Hypertension    Pessary maintenance 03/10/2015    Patient Active Problem List   Diagnosis Date Noted   Gastric erosion    Rib pain on right side 01/17/2021   H. pylori infection 07/14/2020   NSAID long-term use 07/14/2020   Anemia 04/14/2020   Pessary maintenance 04/14/2020   GERD (gastroesophageal reflux disease) 01/14/2020   Osteoarthritis of right shoulder 01/14/2020   Ear pain, bilateral 12/17/2019   Osteoporosis 09/16/2019   Controlled type 2 diabetes mellitus without complication, without long-term current use of insulin ([place]) 09/16/2019   Hyperlipidemia 09/16/2019   Problem with vaginal pessary ([place]) 09/03/2018   Fitting  and adjustment of pessary 03/10/2015   Uterovaginal prolapse, complete 02/18/2015   Complete uterovaginal prolapse 02/01/2015   Female stress incontinence 06/10/2014   Urge incontinence 06/10/2014    Past Surgical History:  Procedure Laterality Date   BIOPSY  05/26/2020   Procedure: BIOPSY;  Surgeon: Daneil Dolin, MD;  [facility];  Service: Endoscopy;;   BREAST SURGERY Right    lumpectomy    CHOLECYSTECTOMY     COLONOSCOPY N/A 05/26/2020   Procedure: COLONOSCOPY;  Surgeon: Daneil Dolin, MD; Diverticulosis in sigmoid colon, friable colonic mucosa likely secondary to ASA/NSAID insult, exam was otherwise normal.     ESOPHAGOGASTRODUODENOSCOPY N/A 05/26/2020   Procedure: ESOPHAGOGASTRODUODENOSCOPY (EGD);  Surgeon: Daneil Dolin, MD;  Normal esophagus s/p dilation, mucosal changes in the stomach biopsied, normal examined duodenum.  Pathology revealed H. pylori.   EYE SURGERY     GIVENS CAPSULE STUDY N/A 02/16/2021   Procedure: GIVENS CAPSULE STUDY;  Surgeon: Daneil Dolin, MD;  [facility];  Service: Endoscopy;  Laterality: N/A;  7:30am     OB History     Gravida  2   Para  2   Term  2   Preterm      AB      Living  2      SAB      IAB      Ectopic      Multiple      Live Births  2           Family History  Problem Relation Age of Onset   Cancer Father    Stroke Mother    Hypertension Mother    Diabetes Sister    Hypertension Sister    Cancer Brother    Hypertension Brother    Bipolar disorder Daughter    Hypertension Maternal Grandmother    Tuberculosis Paternal Grandmother    Hypertension Paternal Grandmother    Alcohol abuse Paternal Grandfather    Hypertension Paternal Grandfather    Colon cancer Neg Hx     Social History   Tobacco Use   Smoking status: Never   Smokeless tobacco: Never  Vaping Use   Vaping Use: Never used  Substance Use Topics   Alcohol use: No   Drug use: No    Home Medications Prior to  Admission medications   Medication Sig Start Date End Date Taking? Authorizing Provider  ACCU-CHEK AVIVA PLUS test strip USE AS DIRECTED TO TEST TWICE DAILY. 05/23/21   Doree Albee, MD  Accu-Chek Softclix Lancets lancets USE AS DIRECTED TO TEST TWICE DAILY. 03/07/21   Doree Albee, MD  acetaminophen (TYLENOL) 650 MG CR tablet Take 650 mg by mouth 2 (two) times daily as needed for pain.    [provider]  cholecalciferol (VITAMIN D) 1000 UNITS tablet Take 1,000 Units by mouth daily.    [provider]  clotrimazole-betamethasone (LOTRISONE) cream Apply 1 application topically 2 (two) times daily. 06/28/20   [name], NP  fexofenadine (ALLEGRA) 180 MG tablet Take 180 mg by mouth daily.    [provider]  fluticasone (FLONASE) 50 MCG/ACT nasal spray Place 2 sprays into both nostrils daily as needed for allergies or rhinitis.    [provider]  lisinopril (ZESTRIL) 2.5 MG tablet TAKE ONE TABLET (2.5MG  TOTAL) BY MOUTH DAILY 05/10/21   Doree Albee, MD  meclizine (ANTIVERT) 25 MG tablet Take 1 tablet (25 mg total) by mouth 3 (three) times daily as needed for dizziness. 06/28/21   [name], NP  metFORMIN (GLUCOPHAGE) 500 MG tablet Take one with largest meal Patient taking differently: Take 500 mg by mouth daily with supper. 01/14/20   Corum, Rex Kras, MD  Multiple Vitamins-Minerals (IMMUNE SUPPORT PO) Take 1 tablet by mouth daily.    [provider]  pantoprazole (PROTONIX) 40 MG tablet TAKE ONE TABLET (40MG  TOTAL) BY MOUTH DAILY BEFORE BREAKFAST 04/14/21   Gosrani, Nimish C, MD  SLOW FE 142 (45 Fe) MG TBCR Take 2 tablets by mouth every morning. 03/29/21   [name], NP    Allergies    Penicillins  Review of Systems   Review of Systems  Genitourinary:  Negative for dysuria.  Neurological:  Positive for weakness.  All other systems reviewed and are negative.  Physical Exam Updated Vital Signs BP (!) 113/49   Pulse (!) 104   Temp  100.2 F (37.9 C)   Resp 11   Ht 4\' 8"  (1.422 m)   Wt 78.5 kg   SpO2 100%   BMI 38.79 kg/m   Physical Exam Vitals and nursing note reviewed.  Constitutional:      General: She is not in acute distress.    Appearance: She is well-developed. She is not diaphoretic.  HENT:     Head: Normocephalic and atraumatic.     Mouth/Throat:     Mouth: Mucous membranes are moist.     Pharynx: No oropharyngeal exudate  or posterior oropharyngeal erythema.  Cardiovascular:     Rate and Rhythm: Normal rate and regular rhythm.     Heart sounds: No murmur heard.   No friction rub. No gallop.  Pulmonary:     Effort: Pulmonary effort is normal. No respiratory distress.     Breath sounds: Normal breath sounds. No wheezing.  Abdominal:     General: Bowel sounds are normal. There is no distension.     Palpations: Abdomen is soft.     Tenderness: There is no abdominal tenderness.  Musculoskeletal:        General: Normal range of motion.     Cervical back: Normal range of motion and neck supple.  Skin:    General: Skin is warm and dry.  Neurological:     General: No focal deficit present.     Mental Status: She is alert and oriented to person, place, and time.    ED Results / Procedures / Treatments   Labs (all labs ordered are listed, but only abnormal results are displayed) Labs Reviewed  CBC WITH DIFFERENTIAL/PLATELET - Abnormal; Notable for the following components:      Result Value   RBC 3.57 (*)    Hemoglobin 10.5 (*)    HCT 32.3 (*)    All other components within normal limits  RESP PANEL BY RT-PCR (FLU A&B, COVID) [id number]  COMPREHENSIVE METABOLIC PANEL  URINALYSIS, ROUTINE W REFLEX MICROSCOPIC  TROPONIN I (HIGH SENSITIVITY)    EKG EKG Interpretation  Date/Time:  Tuesday June 28 2021 21:25:40 EDT Ventricular Rate:  109 PR Interval:  173 QRS Duration: 89 QT Interval:  323 QTC Calculation: 435 R Axis:   -26 Text Interpretation: Sinus tachycardia Borderline left axis  deviation Abnormal R-wave progression, early transition Confirmed by Veryl Speak [id number]) on 06/28/2021 11:13:30 PM  Radiology No results found.  Procedures Procedures   Medications Ordered in ED Medications - No data to display  ED Course  I have reviewed the triage vital signs and the nursing notes.  Pertinent labs & imaging results that were available during my care of the patient were reviewed by me and considered in my medical decision making (see chart for details).    MDM Rules/Calculators/A&P  Patient presenting with weakness that began this morning.  She arrives here with temp of 100.2.  Laboratory studies are all unremarkable as is EKG and chest x-ray.  COVID-19 test positive.  Patient having no hypoxia and stable vital signs.  She was offered Paxlovid but declines.  I will provide her with a prescription she can keep on hand if she changes her mind.  To return as needed for any problems.  Final Clinical Impression(s) / ED Diagnoses Final diagnoses:  None    Rx / DC Orders ED Discharge Orders     None        Veryl Speak, MD 06/29/21 [phone]

## 2021-06-28 NOTE — Telephone Encounter (Signed)
Called patient and gave her the message. Patient does not have Meclizine and asked if you could send to [facility] for her. Patient denies any symptoms other than dizziness.

## 2021-06-28 NOTE — Telephone Encounter (Signed)
Refill of meclizine sent to [facility]. I see she has been scheduled for next week thank you for your help.

## 2021-06-28 NOTE — Telephone Encounter (Signed)
Patient called and stated that she is having severe dizziness and wants to know if you can send something in for her? She thinks that she may have vertigo?

## 2021-06-28 NOTE — Telephone Encounter (Signed)
I think she needs further evaluation. I would like for her to be scheduled for first available appointment with either me or Dr. Darnell Level. I recommend she follow-up with ENT as she was previously referred to them. Has she heard from them regarding getting an appointment scheduled yet? While waiting to be seen she should take her meclizine as needed and I can prescribe more for her if she needs me to. If she has any additional neurologic symptoms (speech slurring, difficulty swallowing, weakness, numbness, facial droop, etc) between now and he next appointment then she needs to go to the ER.

## 2021-06-28 NOTE — Addendum Note (Signed)
Addended by: [name] on: 06/28/2021 06:08 PM   Modules accepted: Orders

## 2021-06-28 NOTE — ED Triage Notes (Signed)
BIB [facility] with c/o dizziness and weakness in legs. Recently dx with vertigo.

## 2021-06-29 ENCOUNTER — Other Ambulatory Visit (INDEPENDENT_AMBULATORY_CARE_PROVIDER_SITE_OTHER): Payer: Self-pay | Admitting: Nurse Practitioner

## 2021-06-29 DIAGNOSIS — R42 Dizziness and giddiness: Secondary | ICD-10-CM

## 2021-06-29 LAB — RESP PANEL BY RT-PCR (FLU A&B, COVID) ARPGX2
Influenza A by PCR: NEGATIVE
Influenza B by PCR: NEGATIVE
SARS Coronavirus 2 by RT PCR: POSITIVE — AB

## 2021-06-29 MED ORDER — PAXLOVID 10 X 150 MG & 10 X 100MG PO TBPK: 2.0000 | ORAL_TABLET | Freq: Two times a day (BID) | ORAL | 0 refills | Status: AC

## 2021-06-29 NOTE — ED Notes (Signed)
Date and time results received: 06/29/21 0040 (use smartphrase ".now" to insert current time)  Test: covid Critical Value: positive  Name of Provider Notified: dr Stark Jock  Orders Received? Or Actions Taken?: Actions Taken: no orders received

## 2021-06-29 NOTE — ED Notes (Signed)
Daughter updated at this time.   

## 2021-06-29 NOTE — Discharge Instructions (Signed)
Take Tylenol 650 mg rotated with ibuprofen 400 mg every 4 hours as needed for fever.  Fill the prescription for Paxlovid you were given this evening if you so desire.  Isolate at home for the next 5 days.  Return to the emergency department if you develop severe chest pain, difficulty breathing, or other new and concerning symptoms.

## 2021-07-05 ENCOUNTER — Telehealth (INDEPENDENT_AMBULATORY_CARE_PROVIDER_SITE_OTHER): Payer: Self-pay

## 2021-07-05 ENCOUNTER — Ambulatory Visit (INDEPENDENT_AMBULATORY_CARE_PROVIDER_SITE_OTHER): Payer: Medicare Other | Admitting: Internal Medicine

## 2021-07-05 NOTE — Telephone Encounter (Signed)
Transition Care Management Follow-up Telephone Call Date of discharge and from where: 06/29/21 @ 2:01 AM. How have you been since you were released from the hospital? [place]. Any questions or concerns? No  Items Reviewed: Did the pt receive and understand the discharge instructions provided? No  Medications obtained and verified? No  Other? No  Any new allergies since your discharge? No  Dietary orders reviewed? No Do you have support at home? No   Home Care and Equipment/Supplies: Were home health services ordered? No , BUT WOULD LIKE TO HAVE SOME HELP.  If so, what is the name of the agency? NONE , WHO ACCEPTS INSURANCES. Has the agency set up a time to come to the patient's home? NO Were any new equipment or medical supplies ordered?  No What is the name of the medical supply agency? NONE  Were you able to get the supplies/equipment? no Do you have any questions related to the use of the equipment or supplies? No  Functional Questionnaire: (I = Independent and D = Dependent) ADLs: D  Bathing/Dressing- D  Meal Prep- D  Eating- D  Maintaining continence- D  Transferring/Ambulation- D  Managing Meds- D  Follow up appointments reviewed:  PCP Hospital f/u appt confirmed? Yes  Scheduled to see Lowry Bowl on 07/21/21 @ 8:30AM. [facility] f/u appt confirmed? No  Scheduled to see  on  @ . Are transportation arrangements needed? Yes  If their condition worsens, is the pt aware to call PCP or go to the Emergency Dept.? Yes Was the patient provided with contact information for the PCP's office or ED? Yes Was to pt encouraged to call back with questions or concerns? Yes

## 2021-07-06 ENCOUNTER — Other Ambulatory Visit (INDEPENDENT_AMBULATORY_CARE_PROVIDER_SITE_OTHER): Payer: Self-pay | Admitting: Nurse Practitioner

## 2021-07-06 DIAGNOSIS — R531 Weakness: Secondary | ICD-10-CM

## 2021-07-06 NOTE — Progress Notes (Signed)
Ordered referral to [facility], they may deny it because I have not done a face-to-face visit since her discharge from the ER.

## 2021-07-12 ENCOUNTER — Telehealth (INDEPENDENT_AMBULATORY_CARE_PROVIDER_SITE_OTHER): Payer: Self-pay

## 2021-07-12 ENCOUNTER — Other Ambulatory Visit (INDEPENDENT_AMBULATORY_CARE_PROVIDER_SITE_OTHER): Payer: Self-pay | Admitting: Internal Medicine

## 2021-07-12 NOTE — Telephone Encounter (Signed)
Patient called and left a detailed voice message that she needs a refill of the following medication sent to [facility]:  Accu-Chek Softclix Lancets lancets  Last filled 03/07/2021  Last OV 06/09/2021

## 2021-07-18 ENCOUNTER — Other Ambulatory Visit: Payer: Self-pay

## 2021-07-18 ENCOUNTER — Emergency Department (HOSPITAL_COMMUNITY)
Admission: EM | Admit: 2021-07-18 | Discharge: 2021-07-18 | Disposition: A | Discharge status: Home / Self Care | Payer: Medicare Other | Attending: Emergency Medicine | Admitting: Emergency Medicine

## 2021-07-18 ENCOUNTER — Encounter (HOSPITAL_COMMUNITY): Payer: Self-pay | Admitting: *Deleted

## 2021-07-18 ENCOUNTER — Emergency Department (HOSPITAL_COMMUNITY): Payer: Medicare Other

## 2021-07-18 DIAGNOSIS — Z7984 Long term (current) use of oral hypoglycemic drugs: Secondary | ICD-10-CM | POA: Diagnosis not present

## 2021-07-18 DIAGNOSIS — R531 Weakness: Secondary | ICD-10-CM | POA: Insufficient documentation

## 2021-07-18 DIAGNOSIS — N390 Urinary tract infection, site not specified: Secondary | ICD-10-CM | POA: Diagnosis not present

## 2021-07-18 DIAGNOSIS — E119 Type 2 diabetes mellitus without complications: Secondary | ICD-10-CM | POA: Diagnosis not present

## 2021-07-18 DIAGNOSIS — Z79899 Other long term (current) drug therapy: Secondary | ICD-10-CM | POA: Insufficient documentation

## 2021-07-18 DIAGNOSIS — I10 Essential (primary) hypertension: Secondary | ICD-10-CM | POA: Insufficient documentation

## 2021-07-18 DIAGNOSIS — Z853 Personal history of malignant neoplasm of breast: Secondary | ICD-10-CM | POA: Insufficient documentation

## 2021-07-18 LAB — CBC WITH DIFFERENTIAL/PLATELET
Abs Immature Granulocytes: 0.04 10*3/uL (ref 0.00–0.07)
Basophils Absolute: 0 10*3/uL (ref 0.0–0.1)
Basophils Relative: 0 %
Eosinophils Absolute: 0 10*3/uL (ref 0.0–0.5)
Eosinophils Relative: 0 %
HCT: 30 % — ABNORMAL LOW (ref 36.0–46.0)
Hemoglobin: 9.8 g/dL — ABNORMAL LOW (ref 12.0–15.0)
Immature Granulocytes: 1 %
Lymphocytes Relative: 21 %
Lymphs Abs: 1.5 10*3/uL (ref 0.7–4.0)
MCH: 29.6 pg (ref 26.0–34.0)
MCHC: 32.7 g/dL (ref 30.0–36.0)
MCV: 90.6 fL (ref 80.0–100.0)
Monocytes Absolute: 1 10*3/uL (ref 0.1–1.0)
Monocytes Relative: 13 %
Neutro Abs: 4.9 10*3/uL (ref 1.7–7.7)
Neutrophils Relative %: 65 %
Platelets: 246 10*3/uL (ref 150–400)
RBC: 3.31 MIL/uL — ABNORMAL LOW (ref 3.87–5.11)
RDW: 14.8 % (ref 11.5–15.5)
WBC: 7.4 10*3/uL (ref 4.0–10.5)
nRBC: 0 % (ref 0.0–0.2)

## 2021-07-18 LAB — URINALYSIS, ROUTINE W REFLEX MICROSCOPIC
Bilirubin Urine: NEGATIVE
Glucose, UA: NEGATIVE mg/dL
Ketones, ur: NEGATIVE mg/dL
Nitrite: NEGATIVE
Protein, ur: 100 mg/dL — AB
Specific Gravity, Urine: 1.013 (ref 1.005–1.030)
WBC, UA: >50 WBC/hpf — ABNORMAL HIGH (ref 0–5)
pH: 5 (ref 5.0–8.0)

## 2021-07-18 LAB — BASIC METABOLIC PANEL
Anion gap: 9 (ref 5–15)
BUN: 22 mg/dL (ref 8–23)
CO2: 23 mmol/L (ref 22–32)
Calcium: 8.9 mg/dL (ref 8.9–10.3)
Chloride: 100 mmol/L (ref 98–111)
Creatinine, Ser: 0.94 mg/dL (ref 0.44–1.00)
GFR, Estimated: 59 mL/min — ABNORMAL LOW (ref >60)
Glucose, Bld: 98 mg/dL (ref 70–99)
Potassium: 4.8 mmol/L (ref 3.5–5.1)
Sodium: 132 mmol/L — ABNORMAL LOW (ref 135–145)

## 2021-07-18 MED ORDER — SULFAMETHOXAZOLE-TRIMETHOPRIM 800-160 MG PO TABS: 1.0000 | ORAL_TABLET | Freq: Two times a day (BID) | ORAL | 0 refills | Status: AC

## 2021-07-18 NOTE — ED Triage Notes (Signed)
Pt c/o bilateral leg weakness since Sunday. Denies numbness/tingling.

## 2021-07-18 NOTE — Discharge Instructions (Addendum)
Call your primary care doctor or specialist as discussed in the next 2-3 days.   Return immediately back to the ER if:  Your symptoms worsen within the next 12-24 hours. You develop new symptoms such as new fevers, persistent vomiting, new pain, shortness of breath, or new weakness or numbness, or if you have any other concerns.  

## 2021-07-18 NOTE — ED Provider Notes (Signed)
[facility] EMERGENCY DEPARTMENT Provider Note   CSN: [id number] Arrival date & time: 07/18/21  [id number]     History Chief Complaint  Patient presents with   Weakness    Kimberly Best is a 87 y.o. female.  Patient presents chief complaint of generalized weakness.  She states that her legs feel unsteady, this has been going on and off for the past week to 2 weeks.  She presented a few weeks ago here and was diagnosed with COVID at the time.  She states that her COVID symptoms have been improving.  She no longer has any body aches.  Denies any fevers or cough.  She states that she has been using a walker at home.      Past Medical History:  Diagnosis Date   Allergy    Anxiety    Arthritis    Cancer ([place])    right breast cancer   Diabetes mellitus without complication ([place])    GERD (gastroesophageal reflux disease)    H. pylori infection 05/2020   S/p treatment with Pylera.  H. pylori stool antigen negative on 12/21/2020.   Hypertension    Pessary maintenance 03/10/2015    Patient Active Problem List   Diagnosis Date Noted   Gastric erosion    Rib pain on right side 01/17/2021   H. pylori infection 07/14/2020   NSAID long-term use 07/14/2020   Anemia 04/14/2020   Pessary maintenance 04/14/2020   GERD (gastroesophageal reflux disease) 01/14/2020   Osteoarthritis of right shoulder 01/14/2020   Ear pain, bilateral 12/17/2019   Osteoporosis 09/16/2019   Controlled type 2 diabetes mellitus without complication, without long-term current use of insulin ([place]) 09/16/2019   Hyperlipidemia 09/16/2019   Problem with vaginal pessary ([place]) 09/03/2018   Fitting and adjustment of pessary 03/10/2015   Uterovaginal prolapse, complete 02/18/2015   Complete uterovaginal prolapse 02/01/2015   Female stress incontinence 06/10/2014   Urge incontinence 06/10/2014    Past Surgical History:  Procedure Laterality Date   BIOPSY  05/26/2020   Procedure: BIOPSY;  Surgeon: Daneil Dolin, MD;   Location[facility];  Service: Endoscopy;;   BREAST SURGERY Right    lumpectomy    CHOLECYSTECTOMY     COLONOSCOPY N/A 05/26/2020   Procedure: COLONOSCOPY;  Surgeon: Daneil Dolin, MD; Diverticulosis in sigmoid colon, friable colonic mucosa likely secondary to ASA/NSAID insult, exam was otherwise normal.     ESOPHAGOGASTRODUODENOSCOPY N/A 05/26/2020   Procedure: ESOPHAGOGASTRODUODENOSCOPY (EGD);  Surgeon: Daneil Dolin, MD;  Normal esophagus s/p dilation, mucosal changes in the stomach biopsied, normal examined duodenum.  Pathology revealed H. pylori.   EYE SURGERY     GIVENS CAPSULE STUDY N/A 02/16/2021   Procedure: GIVENS CAPSULE STUDY;  Surgeon: Daneil Dolin, MD;  Location: [facility];  Service: Endoscopy;  Laterality: N/A;  7:30am     OB History     Gravida  2   Para  2   Term  2   Preterm      AB      Living  2      SAB      IAB      Ectopic      Multiple      Live Births  2           Family History  Problem Relation Age of Onset   Cancer Father    Stroke Mother    Hypertension Mother    Diabetes Sister    Hypertension  Sister    Cancer Brother    Hypertension Brother    Bipolar disorder Daughter    Hypertension Maternal Grandmother    Tuberculosis Paternal Grandmother    Hypertension Paternal Grandmother    Alcohol abuse Paternal Grandfather    Hypertension Paternal Grandfather    Colon cancer Neg Hx     Social History   Tobacco Use   Smoking status: Never   Smokeless tobacco: Never  Vaping Use   Vaping Use: Never used  Substance Use Topics   Alcohol use: No   Drug use: No    Home Medications Prior to Admission medications   Medication Sig Start Date End Date Taking? Authorizing Provider  sulfamethoxazole-trimethoprim (BACTRIM DS) 800-160 MG tablet Take 1 tablet by mouth 2 (two) times daily for 7 days. 07/18/21 07/25/21 Yes Latrelle Fuston, Greggory Brandy, MD  ACCU-CHEK AVIVA PLUS test strip USE AS DIRECTED TO TEST TWICE DAILY. 05/23/21    Doree Albee, MD  Accu-Chek Softclix Lancets lancets USE AS DIRECTED TO TEST TWICE DAILY. 07/12/21   [name], NP  acetaminophen (TYLENOL) 650 MG CR tablet Take 650 mg by mouth 2 (two) times daily as needed for pain.    [provider]  cholecalciferol (VITAMIN D) 1000 UNITS tablet Take 1,000 Units by mouth daily.    [provider]  clotrimazole-betamethasone (LOTRISONE) cream Apply 1 application topically 2 (two) times daily. 06/28/20   [name], NP  fexofenadine (ALLEGRA) 180 MG tablet Take 180 mg by mouth daily.    [provider]  fluticasone (FLONASE) 50 MCG/ACT nasal spray Place 2 sprays into both nostrils daily as needed for allergies or rhinitis.    [provider]  lisinopril (ZESTRIL) 2.5 MG tablet TAKE ONE TABLET (2.'5MG'$  TOTAL) BY MOUTH DAILY 05/10/21   Doree Albee, MD  meclizine (ANTIVERT) 25 MG tablet Take 1 tablet (25 mg total) by mouth 3 (three) times daily as needed for dizziness. 06/28/21   [name], NP  metFORMIN (GLUCOPHAGE) 500 MG tablet Take one with largest meal Patient taking differently: Take 500 mg by mouth daily with supper. 01/14/20   Corum, Rex Kras, MD  Multiple Vitamins-Minerals (IMMUNE SUPPORT PO) Take 1 tablet by mouth daily.    [provider]  pantoprazole (PROTONIX) 40 MG tablet TAKE ONE TABLET ('40MG'$  TOTAL) BY MOUTH DAILY BEFORE BREAKFAST 04/14/21   Gosrani, Nimish C, MD  SLOW FE 142 (45 Fe) MG TBCR Take 2 tablets by mouth every morning. 03/29/21   [name], NP    Allergies    Penicillins  Review of Systems   Review of Systems  Constitutional:  Negative for fever.  HENT:  Negative for ear pain.   Eyes:  Negative for pain.  Respiratory:  Negative for cough.   Cardiovascular:  Negative for chest pain.  Gastrointestinal:  Negative for abdominal pain.  Genitourinary:  Negative for flank pain.  Musculoskeletal:  Negative for back pain.  Skin:  Negative for rash.  Neurological:  Negative for  headaches.   Physical Exam Updated Vital Signs BP (!) 117/58   Pulse 79   Temp 99.8 F (37.7 C) (Oral)   Resp 18   Ht '4\' 8"'$  (1.422 m)   Wt 79.8 kg   SpO2 100%   BMI 39.46 kg/m   Physical Exam Constitutional:      General: She is not in acute distress.    Appearance: Normal appearance.  HENT:     Head: Normocephalic.  Nose: Nose normal.  Eyes:     Extraocular Movements: Extraocular movements intact.  Cardiovascular:     Rate and Rhythm: Normal rate.  Pulmonary:     Effort: Pulmonary effort is normal.  Musculoskeletal:        General: Normal range of motion.     Cervical back: Normal range of motion.  Neurological:     General: No focal deficit present.     Mental Status: She is alert and oriented to person, place, and time. Mental status is at baseline.     Cranial Nerves: No cranial nerve deficit.     Motor: No weakness.     Comments: Patient is neuro intact.  5/5 strength all extremities.  Cranial nerves II to XII intact.    ED Results / Procedures / Treatments   Labs (all labs ordered are listed, but only abnormal results are displayed) Labs Reviewed  BASIC METABOLIC PANEL - Abnormal; Notable for the following components:      Result Value   Sodium 132 (*)    GFR, Estimated 59 (*)    All other components within normal limits  URINALYSIS, ROUTINE W REFLEX MICROSCOPIC - Abnormal; Notable for the following components:   APPearance CLOUDY (*)    Hgb urine dipstick MODERATE (*)    Protein, ur 100 (*)    Leukocytes,Ua LARGE (*)    WBC, UA >50 (*)    Bacteria, UA MANY (*)    Non Squamous Epithelial 0-5 (*)    All other components within normal limits  CBC WITH DIFFERENTIAL/PLATELET - Abnormal; Notable for the following components:   RBC 3.31 (*)    Hemoglobin 9.8 (*)    HCT 30.0 (*)    All other components within normal limits    EKG None  Radiology DG Chest 2 View  Result Date: 07/18/2021 CLINICAL DATA:  Weakness EXAM: CHEST - 2 VIEW COMPARISON:   06/28/2021 FINDINGS: Low lung volumes. Persistent mild elevation of right hemidiaphragm. Right basilar atelectasis/scarring. No pleural effusion. No pneumothorax. Cardiomediastinal contours are within normal limits. Clips overlie the right chest wall. IMPRESSION: Right basilar atelectasis/scarring. Electronically Signed   By: Macy Mis M.D.   On: 07/18/2021 10:03    Procedures Procedures   Medications Ordered in ED Medications - No data to display  ED Course  I have reviewed the triage vital signs and the nursing notes.  Pertinent labs & imaging results that were available during my care of the patient were reviewed by me and considered in my medical decision making (see chart for details).    MDM Rules/Calculators/A&P                           Labs unremarkable.  White count normal chemistry normal.  Urinalysis positive for UTI.  Will give abx prescription.  advised outpatient follow-up with her doctor within 3 to 4 days.  Recommend immediate return for worsening symptoms fevers or any additional concerns.  Final Clinical Impression(s) / ED Diagnoses Final diagnoses:  Lower urinary tract infectious disease  Weakness    Rx / DC Orders ED Discharge Orders          Ordered    sulfamethoxazole-trimethoprim (BACTRIM DS) 800-160 MG tablet  2 times daily        07/18/21 1352             Luna Fuse, MD 07/18/21 1352

## 2021-07-18 NOTE — ED Provider Notes (Signed)
Emergency Medicine Provider Triage Evaluation Note  DAIRY WISSER , a 87 y.o. female  was evaluated in triage.  Pt complains of bilateral lower extremity weakness x2 days.  She describes that her legs feel like "jelly" with ambulation.  She denies weakness in her other extremities.  She was diagnosed with COVID several weeks ago, but felt like she was doing well until 2 days ago.  She denies cough or shortness of breath, no fevers.  She does endorse that her appetite has been reduced over the past 2 days.  She also denies chest pain or abdominal pain, no nausea or vomiting.  She states she feels "trembly" inside.  Denies palpitations.  States her blood sugars have been under good control but she has not tested this this morning.  Review of Systems  Positive: Weakness and reduced appetite Negative: Fever, chest pain, palpitations, abdominal pain, nausea or vomiting, shortness of breath  Physical Exam  BP 115/73 (BP Location: Left Arm)   Pulse 96   Temp 99.8 F (37.7 C) (Oral)   Resp 20   Ht '4\' 8"'$  (1.422 m)   Wt 79.8 kg   SpO2 98%   BMI 39.46 kg/m  Gen:   Awake, no distress    Resp:  Normal effort   MSK:   Moves extremities without difficulty   Other:     Medical Decision Making  Medically screening exam initiated at 9:11 AM.  Appropriate orders placed.  SAAVI LAWTON was informed that the remainder of the evaluation will be completed by another provider, this initial triage assessment does not replace that evaluation, and the importance of remaining in the ED until their evaluation is complete.  Patient was medically screened, labs, EKG, chest x-ray ordered.   Evalee Jefferson, PA-C 07/18/21 [id number]    Luna Fuse, MD 07/18/21 1323

## 2021-07-20 ENCOUNTER — Telehealth (INDEPENDENT_AMBULATORY_CARE_PROVIDER_SITE_OTHER): Payer: Self-pay

## 2021-07-20 NOTE — Telephone Encounter (Signed)
.  Transition Care Management Unsuccessful Follow-up Telephone Call  Date of discharge and from where:  07/20/2021  Attempts:  1st Attempt  Reason for unsuccessful TCM follow-up call:  Unable to leave message  Called pt home phone, 2nd call was for the Dtr as emergency contact. I remember previously a month ago. Pt was staying with Dtr when she was sick recovering from [place]. Pt may not be back home on her own quite yet.

## 2021-07-21 ENCOUNTER — Ambulatory Visit (INDEPENDENT_AMBULATORY_CARE_PROVIDER_SITE_OTHER): Payer: Medicare Other | Admitting: Nurse Practitioner

## 2021-08-01 ENCOUNTER — Other Ambulatory Visit (INDEPENDENT_AMBULATORY_CARE_PROVIDER_SITE_OTHER): Payer: Self-pay

## 2021-08-01 ENCOUNTER — Encounter: Payer: Self-pay | Admitting: Gastroenterology

## 2021-08-01 DIAGNOSIS — E119 Type 2 diabetes mellitus without complications: Secondary | ICD-10-CM

## 2021-08-01 DIAGNOSIS — R21 Rash and other nonspecific skin eruption: Secondary | ICD-10-CM

## 2021-08-01 NOTE — Progress Notes (Signed)
Referring Provider: [name], NP Primary Care Physician:  [name], NP Primary GI Physician: Dr. Gala Romney  Chief Complaint  Patient presents with   Anemia   Gastroesophageal Reflux    Doing fine    HPI:   Kimberly Best is a 87 y.o. female presenting today for routine follow-up, last seen in our office February 2022.  She has history of IDA with hemoglobin in the 9-10 range since September 2019 and found to be iron deficient in April 2021, GERD, dysphagia s/p empiric dilation in June 2021, H. pylori in June 2021 s/p treatment with Pylera, documented eradication via stool antigen test January 2022.  EGD and colonoscopy were both performed in June 2021 for IDA.  EGD with normal examined esophagus, mild mucosal changes in the stomach biopsied revealing H. pylori, normal duodenum.  Colonoscopy with diverticulosis in the sigmoid colon, friable colonic mucosa likely secondary to ASA/NSAID insult, otherwise normal exam.  No recommendations to repeat.  Givens capsule completed in March 2022 with gastric and duodenal bulbar erosions, and 2 nonbleeding small bowel ulcers with benign appearance.  Findings likely secondary to ongoing aspirin use.  She was continued on PPI daily, iron daily, advised to discuss necessity of aspirin with PCP.  Ultimately, PCP discontinued aspirin.  Most recent hemoglobin on file 07/18/2021 down to 9.8 from 10.5, 55-monthprior.  She did have evidence of UTI at that time with RBCs and hemoglobin on UA. Last iron panel on file 06/28/2019 within normal limits.  Today:  States she is doing very well.  Denies BRBPR, melena, or other obvious bleeding such as vaginal bleeding, hematuria, or nosebleeds.  She continues to take 2 iron daily.  Denies abdominal pain, nausea, vomiting, unintentional weight loss.  Occasional constipation with iron, but nothing routine.  Usually, bowels move 1-2 times every day.  GERD well controlled on Protonix 40 mg daily.  Denies  dysphagia.  No NSAIDs. Only taking Tylenol.  Couple ounces of champagne once every couple of weeks or less.  Past Medical History:  Diagnosis Date   Allergy    Anxiety    Arthritis    [place]    right breast cancer   Diabetes mellitus without complication ([place]    GERD (gastroesophageal reflux disease)    H. pylori infection 05/2020   S/p treatment with Pylera.  H. pylori stool antigen negative on 12/21/2020.   Hypertension    IDA (iron deficiency anemia)    Hg in 9-10 range since September 2019.   Pessary maintenance 03/10/2015    Past Surgical History:  Procedure Laterality Date   BIOPSY  05/26/2020   Procedure: BIOPSY;  Surgeon: RDaneil Dolin MD;  [facility];  Service: Endoscopy;;   BREAST SURGERY Right    lumpectomy    CHOLECYSTECTOMY     COLONOSCOPY N/A 05/26/2020   Procedure: COLONOSCOPY;  Surgeon: RDaneil Dolin MD; Diverticulosis in sigmoid colon, friable colonic mucosa likely secondary to ASA/NSAID insult, exam was otherwise normal.     ESOPHAGOGASTRODUODENOSCOPY N/A 05/26/2020   Procedure: ESOPHAGOGASTRODUODENOSCOPY (EGD);  Surgeon: RDaneil Dolin MD;  Normal esophagus s/p dilation, mucosal changes in the stomach biopsied, normal examined duodenum.  Pathology revealed H. pylori.   EYE SURGERY     GIVENS CAPSULE STUDY N/A 02/16/2021   Procedure: GIVENS CAPSULE STUDY;  Surgeon: RDaneil Dolin MD;  gastric and duodenal bulbar erosions, 2 nonbleeding small bowel ulcers with benign appearance suspected to be secondary to chronic aspirin  use.    Current Outpatient Medications  Medication Sig Dispense Refill   ACCU-CHEK AVIVA PLUS test strip USE AS DIRECTED TO TEST TWICE DAILY. 100 strip 3   Accu-Chek Softclix Lancets lancets USE AS DIRECTED TO TEST TWICE DAILY. 100 each 0   acetaminophen (TYLENOL) 650 MG CR tablet Take 650 mg by mouth 2 (two) times daily as needed for pain.     cholecalciferol (VITAMIN D) 1000 UNITS tablet Take 1,000 Units by  mouth daily.     clotrimazole-betamethasone (LOTRISONE) cream For up to 14 days and then stop the medication. 45 g 0   fexofenadine (ALLEGRA) 180 MG tablet Take 180 mg by mouth daily.     fluticasone (FLONASE) 50 MCG/ACT nasal spray Place 2 sprays into both nostrils daily as needed for allergies or rhinitis.     lisinopril (ZESTRIL) 2.5 MG tablet TAKE ONE TABLET (2.'5MG'$  TOTAL) BY MOUTH DAILY 90 tablet 0   meclizine (ANTIVERT) 25 MG tablet Take 1 tablet (25 mg total) by mouth 3 (three) times daily as needed for dizziness. 30 tablet 1   metFORMIN (GLUCOPHAGE) 500 MG tablet Take one with largest meal (Patient taking differently: Take 500 mg by mouth daily with supper.) 90 tablet 1   Multiple Vitamins-Minerals (IMMUNE SUPPORT PO) Take 1 tablet by mouth daily.     pantoprazole (PROTONIX) 40 MG tablet TAKE ONE TABLET ('40MG'$  TOTAL) BY MOUTH DAILY BEFORE BREAKFAST 90 tablet 1   SLOW FE 142 (45 Fe) MG TBCR Take 2 tablets by mouth every morning. 180 tablet 1   No current facility-administered medications for this visit.    Allergies as of 08/03/2021 - Review Complete 08/03/2021  Allergen Reaction Noted   Penicillins Swelling 11/18/2014    Family History  Problem Relation Age of Onset   Cancer Father    Stroke Mother    Hypertension Mother    Diabetes Sister    Hypertension Sister    Cancer Brother    Hypertension Brother    Bipolar disorder Daughter    Hypertension Maternal Grandmother    Tuberculosis Paternal Grandmother    Hypertension Paternal Grandmother    Alcohol abuse Paternal Grandfather    Hypertension Paternal Grandfather    Colon cancer Neg Hx     Social History   Socioeconomic History   Marital status: Married    Spouse name: Not on file   Number of children: Not on file   Years of education: Not on file   Highest education level: Not on file  Occupational History   Not on file  Tobacco Use   Smoking status: Never   Smokeless tobacco: Never  Vaping Use   Vaping Use:  Never used  Substance and Sexual Activity   Alcohol use: Yes    Comment: Couple ounces of champagne once every couple of weeks or less.   Drug use: No   Sexual activity: Not Currently    Birth control/protection: Post-menopausal  Other Topics Concern   Not on file  Social History Narrative   Widow since 2015,married for 60 years.Lives alone.Retired.   Social Determinants of Health   Financial Resource Strain: Not on file  Food Insecurity: Not on file  Transportation Needs: Not on file  Physical Activity: Not on file  Stress: Not on file  Social Connections: Not on file    Review of Systems: Gen: Denies fever, chills, cold or flulike symptoms, presyncope, syncope. CV: Denies chest pain, palpitations Resp: Denies dyspnea or cough GI: See HPI GU: Denies hematuria,  dysuria, urinary urgency or frequency Heme: See HPI  Physical Exam: BP 125/67   Pulse 100   Temp (!) 97.5 F (36.4 C)   Ht '4\' 6"'$  (1.372 m)   Wt 169 lb 3.2 oz (76.7 kg)   BMI 40.80 kg/m  General:   Alert and oriented. No distress noted. Pleasant and cooperative.  Head:  Normocephalic and atraumatic. Eyes:  Conjuctiva clear without scleral icterus. Heart:  S1, S2 present without murmurs appreciated. Lungs:  Clear to auscultation bilaterally. No wheezes, rales, or rhonchi. No distress.  Abdomen:  +BS, soft, non-tender and non-distended. No rebound or guarding. No HSM or masses noted. Msk:  Symmetrical without gross deformities. Normal posture. Extremities:  Without edema. Neurologic:  Alert and  oriented x4 Psych:  Normal mood and affect.    Assessment:  87 year old female presenting today for follow-up of IDA and GERD.  IDA: Hemoglobin in 9-10 range since September 2019, found to have component of IDA in April 2021.  B12 WNL.  Kidney function WNL.  She has undergone complete GI evaluation including colonoscopy, EGD, and Givens capsule.  Colonoscopy June 2021 with diverticulosis, friable colonic mucosa likely  secondary to ASA/NSAID insult, otherwise normal.  EGD June 2021 with normal esophagus s/p dilation, mucosal changes in the stomach s/p biopsy revealing H. pylori, otherwise normal exam.  She completed Pylera first week of September 2021.  H. pylori stool antigen 12/21/2020 negative.  Givens capsule March 2022 with gastric and duodenal bulbar erosions, 2 nonbleeding small bowel ulcers with benign appearance suspected to be secondary to chronic aspirin use.  PCP has since discontinued aspirin and patient denies any NSAID use.  She continues on PPI daily and 2 Slow Fe daily.  Clinically, she is doing well and denies overt GI bleeding or any other obvious blood loss.  She did have a UTI in early August with evidence of hemoglobin/RBCs on urinalysis.  The symptoms have resolved currently.  Most recent hemoglobin 9.8 on 07/18/2021 which is fairly consistent with her baseline fluctuations.  We will continue to monitor hemoglobin closely with oral iron supplementation.  If further decline, would need to consider referral to hematology for IV iron.   GERD: Well-controlled on Protonix 40 mg daily.  No alarm symptoms.   Plan: 1.  Recheck CBC in 3 months. 2.  Continue Protonix 40 mg daily. 3.  Continue 2 Slow Fe daily. 4.  Patient is to monitor for bright red blood per rectum or pitch black tarry stools and let us know if this occurs. 5.  Follow-up in 6 months or sooner if needed.    Aliene Altes, PA-C [facility] 08/03/2021

## 2021-08-02 ENCOUNTER — Other Ambulatory Visit (INDEPENDENT_AMBULATORY_CARE_PROVIDER_SITE_OTHER): Payer: Self-pay | Admitting: Nurse Practitioner

## 2021-08-02 DIAGNOSIS — E119 Type 2 diabetes mellitus without complications: Secondary | ICD-10-CM

## 2021-08-03 ENCOUNTER — Ambulatory Visit: Payer: Medicare Other | Admitting: Gastroenterology

## 2021-08-03 ENCOUNTER — Encounter: Payer: Self-pay | Admitting: Gastroenterology

## 2021-08-03 ENCOUNTER — Other Ambulatory Visit: Payer: Self-pay | Admitting: Gastroenterology

## 2021-08-03 ENCOUNTER — Ambulatory Visit (INDEPENDENT_AMBULATORY_CARE_PROVIDER_SITE_OTHER): Payer: Medicare Other | Admitting: Nurse Practitioner

## 2021-08-03 ENCOUNTER — Other Ambulatory Visit: Payer: Self-pay

## 2021-08-03 ENCOUNTER — Encounter (INDEPENDENT_AMBULATORY_CARE_PROVIDER_SITE_OTHER): Payer: Self-pay

## 2021-08-03 VITALS — BP 125/67 | HR 100 | Temp 97.5°F | Ht <= 58 in | Wt 169.2 lb

## 2021-08-03 DIAGNOSIS — K219 Gastro-esophageal reflux disease without esophagitis: Secondary | ICD-10-CM | POA: Diagnosis not present

## 2021-08-03 DIAGNOSIS — D509 Iron deficiency anemia, unspecified: Secondary | ICD-10-CM

## 2021-08-03 MED ORDER — CLOTRIMAZOLE-BETAMETHASONE 1-0.05 % EX CREA: TOPICAL_CREAM | CUTANEOUS | 0 refills | Status: DC

## 2021-08-03 NOTE — Patient Instructions (Signed)
Continue Protonix 40 mg daily 30 minutes before breakfast.  Continue taking 2 iron daily.  Monitor for bright red blood in your stools or pitch black tarry stools and let us know if this occurs.  We will arrange for you to have blood work in 3 months.  We will plan to follow-up with you in 6 months.  Do not hesitate to call if you have questions or concerns prior to your next visit.  It was great to see you today!  Aliene Altes, PA-C [facility]

## 2021-08-10 ENCOUNTER — Encounter: Payer: Self-pay | Admitting: Gastroenterology

## 2021-09-07 ENCOUNTER — Other Ambulatory Visit (INDEPENDENT_AMBULATORY_CARE_PROVIDER_SITE_OTHER): Payer: Self-pay | Admitting: Nurse Practitioner

## 2021-09-14 ENCOUNTER — Other Ambulatory Visit (HOSPITAL_COMMUNITY): Payer: Self-pay | Admitting: Nurse Practitioner

## 2021-09-14 DIAGNOSIS — Z1231 Encounter for screening mammogram for malignant neoplasm of breast: Secondary | ICD-10-CM

## 2021-10-04 DIAGNOSIS — D509 Iron deficiency anemia, unspecified: Secondary | ICD-10-CM | POA: Diagnosis not present

## 2021-10-04 DIAGNOSIS — R42 Dizziness and giddiness: Secondary | ICD-10-CM | POA: Diagnosis not present

## 2021-10-04 DIAGNOSIS — J310 Chronic rhinitis: Secondary | ICD-10-CM | POA: Diagnosis not present

## 2021-10-04 DIAGNOSIS — H903 Sensorineural hearing loss, bilateral: Secondary | ICD-10-CM | POA: Diagnosis not present

## 2021-10-04 DIAGNOSIS — J343 Hypertrophy of nasal turbinates: Secondary | ICD-10-CM | POA: Diagnosis not present

## 2021-10-05 LAB — CBC WITH DIFFERENTIAL/PLATELET
Absolute Monocytes: 399 cells/uL (ref 200–950)
Basophils Absolute: 11 cells/uL (ref 0–200)
Basophils Relative: 0.3 %
Eosinophils Absolute: 49 cells/uL (ref 15–500)
Eosinophils Relative: 1.4 %
HCT: 32.5 % — ABNORMAL LOW (ref 35.0–45.0)
Hemoglobin: 10.9 g/dL — ABNORMAL LOW (ref 11.7–15.5)
Lymphs Abs: 1799 cells/uL (ref 850–3900)
MCH: 30 pg (ref 27.0–33.0)
MCHC: 33.5 g/dL (ref 32.0–36.0)
MCV: 89.5 fL (ref 80.0–100.0)
MPV: 9.6 fL (ref 7.5–12.5)
Monocytes Relative: 11.4 %
Neutro Abs: 1243 cells/uL — ABNORMAL LOW (ref 1500–7800)
Neutrophils Relative %: 35.5 %
Platelets: 251 10*3/uL (ref 140–400)
RBC: 3.63 10*6/uL — ABNORMAL LOW (ref 3.80–5.10)
RDW: 13.9 % (ref 11.0–15.0)
Total Lymphocyte: 51.4 %
WBC: 3.5 10*3/uL — ABNORMAL LOW (ref 3.8–10.8)

## 2021-10-05 LAB — IRON,TIBC AND FERRITIN PANEL
%SAT: 30 % (calc) (ref 16–45)
Ferritin: 85 ng/mL (ref 16–288)
Iron: 92 ug/dL (ref 45–160)
TIBC: 304 mcg/dL (calc) (ref 250–450)

## 2021-10-13 DIAGNOSIS — R42 Dizziness and giddiness: Secondary | ICD-10-CM | POA: Diagnosis not present

## 2021-10-18 DIAGNOSIS — J310 Chronic rhinitis: Secondary | ICD-10-CM | POA: Diagnosis not present

## 2021-10-18 DIAGNOSIS — H832X3 Labyrinthine dysfunction, bilateral: Secondary | ICD-10-CM | POA: Diagnosis not present

## 2021-10-18 DIAGNOSIS — H903 Sensorineural hearing loss, bilateral: Secondary | ICD-10-CM | POA: Diagnosis not present

## 2021-10-18 DIAGNOSIS — R42 Dizziness and giddiness: Secondary | ICD-10-CM | POA: Diagnosis not present

## 2021-10-20 DIAGNOSIS — Z961 Presence of intraocular lens: Secondary | ICD-10-CM | POA: Diagnosis not present

## 2021-10-20 DIAGNOSIS — H0102B Squamous blepharitis left eye, upper and lower eyelids: Secondary | ICD-10-CM | POA: Diagnosis not present

## 2021-10-20 DIAGNOSIS — E119 Type 2 diabetes mellitus without complications: Secondary | ICD-10-CM | POA: Diagnosis not present

## 2021-10-20 DIAGNOSIS — H0102A Squamous blepharitis right eye, upper and lower eyelids: Secondary | ICD-10-CM | POA: Diagnosis not present

## 2021-10-20 DIAGNOSIS — H1045 Other chronic allergic conjunctivitis: Secondary | ICD-10-CM | POA: Diagnosis not present

## 2021-10-20 LAB — HM DIABETES EYE EXAM

## 2021-10-21 ENCOUNTER — Ambulatory Visit (INDEPENDENT_AMBULATORY_CARE_PROVIDER_SITE_OTHER): Payer: Medicare Other | Admitting: Adult Health

## 2021-10-21 ENCOUNTER — Encounter: Payer: Self-pay | Admitting: Adult Health

## 2021-10-21 ENCOUNTER — Other Ambulatory Visit: Payer: Self-pay

## 2021-10-21 VITALS — BP 152/87 | HR 78 | Ht <= 58 in | Wt 171.5 lb

## 2021-10-21 DIAGNOSIS — Z4689 Encounter for fitting and adjustment of other specified devices: Secondary | ICD-10-CM | POA: Diagnosis not present

## 2021-10-21 DIAGNOSIS — N813 Complete uterovaginal prolapse: Secondary | ICD-10-CM | POA: Diagnosis not present

## 2021-10-21 NOTE — Progress Notes (Signed)
  Subjective:     Patient ID: Kimberly Best, female   DOB: 1933/04/27, 87 y.o.   MRN: [id number]  HPI Kimberly Best is an 87 year old black female,married, PM in for pessary maintenance. Has noticed a little discharge, no pain.   Review of Systems A little discharge,no pain Reviewed past medical,surgical, social and family history. Reviewed medications and allergies.     Objective:   Physical Exam BP (!) 152/87 (BP Location: Left Arm, Patient Position: Sitting, Cuff Size: Normal)   Pulse 78   Ht 4\' 8"  (1.422 m)   Wt 171 lb 8 oz (77.8 kg)   BMI 38.45 kg/m     Skin warm and dry.Pelvic: external genitalia is normal in appearance no lesions, vagina: #5 ring pessary with support removed and washed and dried, no lesions, scant tan discharge, no odor,+prolapse, pessary reinserted easily,urethra has no lesions or masses noted, cervix smooth, uterus: normal size, shape and contour, non tender, no masses felt, adnexa: no masses or tenderness noted. Bladder is non tender and no masses felt.  Fall risk is low,uses cane  [facility] - 10/21/21 0924       Pregnancy Intention Screening   Does the patient want to become pregnant in the next year? No    Does the patient's partner want to become pregnant in the next year? No    Would the patient like to discuss contraceptive options today? No      Contraception Wrap Up   Current Method No Method - Other Reason   postmenopausal   End Method No Method - Other Reason   postmenopausal   Contraception Counseling Provided No            Examination chaperoned by Levy Pupa LPN. Assessment:     1. Pessary maintenance, refit 10/18/20 with Milex ring with support #5  2. Uterovaginal prolapse, complete     Plan:     Follow up in 3 months for pessary maintenance

## 2021-10-21 NOTE — Progress Notes (Deleted)
  Subjective:     Patient ID: Kimberly Best, female   DOB: March 09, 1933, 87 y.o.   MRN: [id number]  HPI   Review of Systems     Objective:   Physical Exam     Assessment:     ***    Plan:     ***

## 2021-11-10 ENCOUNTER — Ambulatory Visit (HOSPITAL_COMMUNITY): Payer: Medicare Other | Attending: Otolaryngology | Admitting: Physical Therapy

## 2021-11-14 ENCOUNTER — Other Ambulatory Visit: Payer: Self-pay | Admitting: *Deleted

## 2021-11-14 MED ORDER — ACCU-CHEK SOFTCLIX LANCETS MISC: 0 refills | Status: DC

## 2021-11-15 ENCOUNTER — Ambulatory Visit: Payer: Medicare Other | Admitting: Nurse Practitioner

## 2021-11-21 ENCOUNTER — Ambulatory Visit (HOSPITAL_COMMUNITY)
Admission: RE | Admit: 2021-11-21 | Discharge: 2021-11-21 | Disposition: A | Discharge status: Home / Self Care | Payer: Medicare Other | Source: Ambulatory Visit | Attending: Nurse Practitioner | Admitting: Nurse Practitioner

## 2021-11-21 ENCOUNTER — Other Ambulatory Visit: Payer: Self-pay

## 2021-11-21 DIAGNOSIS — Z1231 Encounter for screening mammogram for malignant neoplasm of breast: Secondary | ICD-10-CM

## 2021-11-30 ENCOUNTER — Ambulatory Visit: Payer: Medicare Other | Admitting: Internal Medicine

## 2021-12-01 ENCOUNTER — Ambulatory Visit (INDEPENDENT_AMBULATORY_CARE_PROVIDER_SITE_OTHER): Payer: Medicare Other | Admitting: Family Medicine

## 2021-12-01 ENCOUNTER — Encounter: Payer: Self-pay | Admitting: Family Medicine

## 2021-12-01 ENCOUNTER — Other Ambulatory Visit: Payer: Self-pay

## 2021-12-01 VITALS — BP 138/82 | HR 84 | Resp 16 | Ht <= 58 in | Wt 173.0 lb

## 2021-12-01 DIAGNOSIS — E119 Type 2 diabetes mellitus without complications: Secondary | ICD-10-CM

## 2021-12-01 DIAGNOSIS — Z7185 Encounter for immunization safety counseling: Secondary | ICD-10-CM | POA: Diagnosis not present

## 2021-12-01 DIAGNOSIS — K219 Gastro-esophageal reflux disease without esophagitis: Secondary | ICD-10-CM | POA: Diagnosis not present

## 2021-12-01 DIAGNOSIS — R42 Dizziness and giddiness: Secondary | ICD-10-CM | POA: Diagnosis not present

## 2021-12-01 DIAGNOSIS — E785 Hyperlipidemia, unspecified: Secondary | ICD-10-CM

## 2021-12-01 DIAGNOSIS — N813 Complete uterovaginal prolapse: Secondary | ICD-10-CM

## 2021-12-01 MED ORDER — PANTOPRAZOLE SODIUM 40 MG PO TBEC: DELAYED_RELEASE_TABLET | ORAL | 0 refills | Status: DC

## 2021-12-01 MED ORDER — MECLIZINE HCL 25 MG PO TABS: 25.0000 mg | ORAL_TABLET | Freq: Three times a day (TID) | ORAL | 2 refills | Status: DC | PRN

## 2021-12-01 NOTE — Patient Instructions (Addendum)
F/u in 8 weeks, re evaluate blood pressure, call if you need me sooner  Lipid, cmp and eGFr , HBA1c and TSH today  Careful not to fall  Please re consider vaccines  Nurse please send for eye exam with Dr Katy Fitch    Nurse please refill meclizine and protonix x 3 months  Thanks for choosing [facility], we consider it a privelige to serve you.

## 2021-12-02 LAB — CMP14+EGFR
ALT: 14 IU/L (ref 0–32)
AST: 21 IU/L (ref 0–40)
Albumin/Globulin Ratio: 1.7 (ref 1.2–2.2)
Albumin: 4.5 g/dL (ref 3.6–4.6)
Alkaline Phosphatase: 59 IU/L (ref 44–121)
BUN/Creatinine Ratio: 25 (ref 12–28)
BUN: 19 mg/dL (ref 8–27)
Bilirubin Total: 0.3 mg/dL (ref 0.0–1.2)
CO2: 23 mmol/L (ref 20–29)
Calcium: 9.7 mg/dL (ref 8.7–10.3)
Chloride: 105 mmol/L (ref 96–106)
Creatinine, Ser: 0.75 mg/dL (ref 0.57–1.00)
Globulin, Total: 2.6 g/dL (ref 1.5–4.5)
Glucose: 92 mg/dL (ref 70–99)
Potassium: 5 mmol/L (ref 3.5–5.2)
Sodium: 142 mmol/L (ref 134–144)
Total Protein: 7.1 g/dL (ref 6.0–8.5)
eGFR: 77 mL/min/{1.73_m2} (ref >59)

## 2021-12-02 LAB — HEMOGLOBIN A1C
Est. average glucose Bld gHb Est-mCnc: 143 mg/dL
Hgb A1c MFr Bld: 6.6 % — ABNORMAL HIGH (ref 4.8–5.6)

## 2021-12-02 LAB — LIPID PANEL
Chol/HDL Ratio: 3.7 ratio (ref 0.0–4.4)
Cholesterol, Total: 238 mg/dL — ABNORMAL HIGH (ref 100–199)
HDL: 64 mg/dL (ref >39)
LDL Chol Calc (NIH): 159 mg/dL — ABNORMAL HIGH (ref 0–99)
Triglycerides: 85 mg/dL (ref 0–149)
VLDL Cholesterol Cal: 15 mg/dL (ref 5–40)

## 2021-12-02 LAB — TSH: TSH: 0.865 u[IU]/mL (ref 0.450–4.500)

## 2021-12-06 ENCOUNTER — Encounter: Payer: Self-pay | Admitting: Family Medicine

## 2021-12-06 DIAGNOSIS — Z7185 Encounter for immunization safety counseling: Secondary | ICD-10-CM | POA: Insufficient documentation

## 2021-12-06 MED ORDER — ROSUVASTATIN CALCIUM 5 MG PO TABS: 5.0000 mg | ORAL_TABLET | Freq: Every day | ORAL | 3 refills | Status: DC

## 2021-12-06 NOTE — Assessment & Plan Note (Signed)
Medication refilled

## 2021-12-06 NOTE — Assessment & Plan Note (Signed)
°  Patient re-educated about  the importance of commitment to a  minimum of 150 minutes of exercise per week as able.  The importance of healthy food choices with portion control discussed, as well as eating regularly and within a 12 hour window most days. The need to choose "clean , green" food 50 to 75% of the time is discussed, as well as to make water the primary drink and set a goal of 64 ounces water daily.    Weight /BMI 12/01/2021 10/21/2021 08/03/2021  WEIGHT 173 lb 0.6 oz 171 lb 8 oz 169 lb 3.2 oz  HEIGHT 4\' 8"  4\' 8"  4\' 6"   BMI 38.79 kg/m2 38.45 kg/m2 40.8 kg/m2

## 2021-12-06 NOTE — Assessment & Plan Note (Signed)
Kimberly Best is reminded of the importance of commitment to daily physical activity for 30 minutes or more, as able and the need to limit carbohydrate intake to 30 to 60 grams per meal to help with blood sugar control.   Kimberly Best is reminded of the importance of daily foot exam, annual eye examination, and good blood sugar, blood pressure and cholesterol control.  Diabetic Labs Latest Ref Rng & Units 12/01/2021 07/18/2021 06/28/2021 06/09/2021 01/27/2021  HbA1c 4.8 - 5.6 % 6.6(H) - - 6.3(H) 6.5(H)  Microalbumin mg/dL - - - - 4.7  Micro/Creat Ratio <30 mcg/mg creat - - - - 43(H)  Chol 100 - 199 mg/dL 238(H) - - 192 -  HDL >39 mg/dL 64 - - 48(L) -  Calc LDL 0 - 99 mg/dL 159(H) - - 118(H) -  Triglycerides 0 - 149 mg/dL 85 - - 151(H) -  Creatinine 0.57 - 1.00 mg/dL 0.75 0.94 1.02(H) 0.78 0.84   BP/Weight 12/01/2021 10/21/2021 08/03/2021 07/18/2021 06/29/2021 06/28/2021 0/96/4383  Systolic BP 818 403 754 360 677 - 034  Diastolic BP 82 87 67 60 61 - 74  Wt. (Lbs) 173.04 171.5 169.2 176 - 173 174.5  BMI 38.79 38.45 40.8 39.46 - 38.79 42.07   Foot/eye exam completion dates Latest Ref Rng & Units 09/16/2019 06/09/2019  Eye Exam No Retinopathy - No Retinopathy  Foot Form Completion - Done -

## 2021-12-06 NOTE — Assessment & Plan Note (Signed)
Managed by [facility]

## 2021-12-06 NOTE — Assessment & Plan Note (Signed)
Hyperlipidemia:Low fat diet discussed and encouraged.   Lipid Panel  Lab Results  Component Value Date   CHOL 238 (H) 12/01/2021   HDL 64 12/01/2021   LDLCALC 159 (H) 12/01/2021   TRIG 85 12/01/2021   CHOLHDL 3.7 12/01/2021     Needs to start crestor 5mg  daily and reduce fat intake

## 2021-12-06 NOTE — Assessment & Plan Note (Signed)
counseleld and encouraged to take vaccines indicated, she is refusing all  At this time but will consider

## 2021-12-06 NOTE — Progress Notes (Signed)
Kimberly Best     MRN: [id number]      DOB: October 12, 1933   HPI Kimberly Best is here to establish care, states needs mds refilled She is deficient in immunization and declines all offered and indicated Denies polyuria, polydipsia, blurred vision , or hypoglycemic episodes. Diabetes is managed by diet There are no specific complaints  Denies polyuria, polydipsia, blurred vision , or hypoglycemic episodes.  ROS Denies recent fever or chills. Denies sinus pressure, nasal congestion, ear pain or sore throat. Denies chest congestion, productive cough or wheezing. Denies chest pains, palpitations and leg swelling Denies abdominal pain, nausea, vomiting,diarrhea or constipation.   Denies dysuria, frequency, hesitancy or incontinence. Denies uncontrolled joint pain, swelling and does have limitation in mobility. Denies headaches, seizures, numbness, or tingling. Denies depression, anxiety or insomnia. Denies skin break down or rash.   PE  BP 138/82    Pulse 84    Resp 16    Ht 4\' 8"  (1.422 m)    Wt 173 lb 0.6 oz (78.5 kg)    SpO2 95%    BMI 38.79 kg/m   Patient alert and oriented and in no cardiopulmonary distress.  HEENT: No facial asymmetry, EOMI,     Neck decreased ROM .  Chest: Clear to auscultation bilaterally.  CVS: S1, S2 no murmurs, no S3.Regular rate.  ABD: Soft non tender.   Ext: No edema  MS: decreased  ROM spine, shoulders, hips and knees.  Skin: Intact, no ulcerations or rash noted.  Psych: Good eye contact, normal affect. Memory intact not anxious or depressed appearing.  CNS: CN 2-12 intact, power,  normal throughout.no focal deficits noted.   Assessment & Plan  Controlled type 2 diabetes mellitus without complication, without long-term current use of insulin ([place]) Kimberly Best is reminded of the importance of commitment to daily physical activity for 30 minutes or more, as able and the need to limit carbohydrate intake to 30 to 60 grams per meal to help with  blood sugar control.   Kimberly Best is reminded of the importance of daily foot exam, annual eye examination, and good blood sugar, blood pressure and cholesterol control.  Diabetic Labs Latest Ref Rng & Units 12/01/2021 07/18/2021 06/28/2021 06/09/2021 01/27/2021  HbA1c 4.8 - 5.6 % 6.6(H) - - 6.3(H) 6.5(H)  Microalbumin mg/dL - - - - 4.7  Micro/Creat Ratio <30 mcg/mg creat - - - - 43(H)  Chol 100 - 199 mg/dL 238(H) - - 192 -  HDL >39 mg/dL 64 - - 48(L) -  Calc LDL 0 - 99 mg/dL 159(H) - - 118(H) -  Triglycerides 0 - 149 mg/dL 85 - - 151(H) -  Creatinine 0.57 - 1.00 mg/dL 0.75 0.94 1.02(H) 0.78 0.84   BP/Weight 12/01/2021 10/21/2021 08/03/2021 07/18/2021 06/29/2021 06/28/2021 12/12/5850  Systolic BP 778 242 353 614 431 - 540  Diastolic BP 82 87 67 60 61 - 74  Wt. (Lbs) 173.04 171.5 169.2 176 - 173 174.5  BMI 38.79 38.45 40.8 39.46 - 38.79 42.07   Foot/eye exam completion dates Latest Ref Rng & Units 09/16/2019 06/09/2019  Eye Exam No Retinopathy - No Retinopathy  Foot Form Completion - Done -        Hyperlipidemia LDL goal <100 Hyperlipidemia:Low fat diet discussed and encouraged.   Lipid Panel  Lab Results  Component Value Date   CHOL 238 (H) 12/01/2021   HDL 64 12/01/2021   LDLCALC 159 (H) 12/01/2021   TRIG 85 12/01/2021   CHOLHDL 3.7 12/01/2021  Needs to start crestor 5mg  daily and reduce fat intake  Complete uterovaginal prolapse Managed by [facility]  GERD (gastroesophageal reflux disease) Medication refilled  Vaccine counseling counseleld and encouraged to take vaccines indicated, she is refusing all  At this time but will consider  Morbid obesity [facility])  Patient re-educated about  the importance of commitment to a  minimum of 150 minutes of exercise per week as able.  The importance of healthy food choices with portion control discussed, as well as eating regularly and within a 12 hour window most days. The need to choose "clean , green" food 50 to 75% of the time is  discussed, as well as to make water the primary drink and set a goal of 64 ounces water daily.    Weight /BMI 12/01/2021 10/21/2021 08/03/2021  WEIGHT 173 lb 0.6 oz 171 lb 8 oz 169 lb 3.2 oz  HEIGHT 4\' 8"  4\' 8"  4\' 6"   BMI 38.79 kg/m2 38.45 kg/m2 40.8 kg/m2

## 2021-12-12 ENCOUNTER — Telehealth: Payer: Self-pay

## 2021-12-12 ENCOUNTER — Other Ambulatory Visit: Payer: Self-pay | Admitting: Family Medicine

## 2021-12-12 NOTE — Telephone Encounter (Signed)
Pt saw Dr Moshe Cipro 12/01/21 and Dr Camillia Herter told her to call her back if she needs anything.  She said she needs BP med refilled.  She is scheduled to see Dr Lynnea Maizes 01/26/22.

## 2021-12-12 NOTE — Telephone Encounter (Signed)
Pt is requesting BP medicine

## 2021-12-12 NOTE — Telephone Encounter (Signed)
I don't see any bp med on her list. Please advise

## 2021-12-13 LAB — HM DIABETES EYE EXAM

## 2021-12-14 ENCOUNTER — Telehealth: Payer: Self-pay

## 2021-12-14 ENCOUNTER — Other Ambulatory Visit: Payer: Self-pay

## 2021-12-14 ENCOUNTER — Ambulatory Visit: Payer: Medicare Other

## 2021-12-14 ENCOUNTER — Other Ambulatory Visit: Payer: Self-pay | Admitting: Family Medicine

## 2021-12-14 VITALS — BP 148/80

## 2021-12-14 DIAGNOSIS — I152 Hypertension secondary to endocrine disorders: Secondary | ICD-10-CM

## 2021-12-14 DIAGNOSIS — E1159 Type 2 diabetes mellitus with other circulatory complications: Secondary | ICD-10-CM

## 2021-12-14 MED ORDER — AMLODIPINE BESYLATE 2.5 MG PO TABS: 2.5000 mg | ORAL_TABLET | Freq: Every day | ORAL | 1 refills | Status: DC

## 2021-12-14 NOTE — Telephone Encounter (Signed)
Will come in after lunch for nurse blood pressure check

## 2021-12-14 NOTE — Telephone Encounter (Signed)
States her past few readings have been 185/103, 184/108, and 168/81. Uses [place] pharmacy. States she feels really uneasy not being on blood pressure meds and she hasn't been on since Dr Darnell Level passed and has been keeping a check on her bp and it is always higher than it should be

## 2021-12-14 NOTE — Telephone Encounter (Signed)
error 

## 2021-12-14 NOTE — Telephone Encounter (Signed)
Documented bp in office 148/80 patient aware to pick up medication from pharmacy.

## 2021-12-20 ENCOUNTER — Other Ambulatory Visit: Payer: Self-pay

## 2021-12-20 ENCOUNTER — Ambulatory Visit (HOSPITAL_COMMUNITY): Payer: Medicare Other | Attending: Otolaryngology | Admitting: Physical Therapy

## 2021-12-20 ENCOUNTER — Encounter (HOSPITAL_COMMUNITY): Payer: Self-pay | Admitting: Physical Therapy

## 2021-12-20 DIAGNOSIS — R42 Dizziness and giddiness: Secondary | ICD-10-CM | POA: Insufficient documentation

## 2021-12-20 NOTE — Therapy (Signed)
[place], [place], [address] Phone: [phone]   Fax:  [phone]  Physical Therapy Evaluation  Patient Details  Name: ANEVAY Best MRN: [id number] Date of Birth: Aug 12, 1933 Referring Provider (PT): Leta Baptist   Encounter Date: 12/20/2021   PT End of Session - 12/20/21 1648     Visit Number 1    Number of Visits 4    Date for PT Re-Evaluation 01/19/22    Authorization Type \             Past Medical History:  Diagnosis Date   Allergy    Anxiety    Arthritis    Cancer ([place])    right breast cancer   Diabetes mellitus without complication ([place])    GERD (gastroesophageal reflux disease)    H. pylori infection 05/2020   S/p treatment with Pylera.  H. pylori stool antigen negative on 12/21/2020.   Hypertension    IDA (iron deficiency anemia)    Hg in 9-10 range since September 2019.   Pessary maintenance 03/10/2015    Past Surgical History:  Procedure Laterality Date   BIOPSY  05/26/2020   Procedure: BIOPSY;  Surgeon: Daneil Dolin, MD;  Location: [facility];  Service: Endoscopy;;   BREAST SURGERY Right    lumpectomy    CHOLECYSTECTOMY     COLONOSCOPY N/A 05/26/2020   Procedure: COLONOSCOPY;  Surgeon: Daneil Dolin, MD; Diverticulosis in sigmoid colon, friable colonic mucosa likely secondary to ASA/NSAID insult, exam was otherwise normal.     ESOPHAGOGASTRODUODENOSCOPY N/A 05/26/2020   Procedure: ESOPHAGOGASTRODUODENOSCOPY (EGD);  Surgeon: Daneil Dolin, MD;  Normal esophagus s/p dilation, mucosal changes in the stomach biopsied, normal examined duodenum.  Pathology revealed H. pylori.   EYE SURGERY     GIVENS CAPSULE STUDY N/A 02/16/2021   Procedure: GIVENS CAPSULE STUDY;  Surgeon: Daneil Dolin, MD;  gastric and duodenal bulbar erosions, 2 nonbleeding small bowel ulcers with benign appearance suspected to be secondary to chronic aspirin use.    There were no vitals filed for this  visit.        [facility] PT Assessment - 12/20/21 0001       Assessment   Medical Diagnosis Dizziness    Referring Provider (PT) Benjamine Mola, su    Onset Date/Surgical Date --   chronic   Next MD Visit not returning    Prior Therapy none      Precautions   Precautions None      Balance Screen   Has the patient fallen in the past 6 months No    Has the patient had a decrease in activity level because of a fear of falling?  Yes    Is the patient reluctant to leave their home because of a fear of falling?  No      Prior Function   Level of Independence Independent with household mobility with device      Cognition   Overall Cognitive Status Within Functional Limits for tasks assessed      Observation/Other Assessments   Focus on Therapeutic Outcomes (FOTO)  47; 63% affected      ROM / Strength   AROM / PROM / Strength AROM      AROM   AROM Assessment Site Cervical    Cervical Flexion wnl   incease  dizziness   Cervical Extension wnl   increases dizziness   Cervical - Right Rotation 68   no dizziness   Cervical -  Left Rotation 75   no dizziness                   Vestibular Assessment - 12/20/21 0001       Symptom Behavior   Subjective history of current problem Pt has been having dizziness since she was a teenager.  States she is dizzy everyday and it lasts for hours.    Type of Dizziness  Lightheadedness;"World moves"    Frequency of Dizziness everyday    Duration of Dizziness couple hours at a time    Symptom Nature Motion provoked;Intermittent    Aggravating Factors Looking up to the ceiling;Moving eyes    Relieving Factors Rest    Progression of Symptoms Better   Meclizine but this does not do as well as it did     Oculomotor Exam   Smooth Pursuits Intact    Saccades Overshoots      Positional Testing   Dix-Hallpike Dix-Hallpike Right;Dix-Hallpike Left      Dix-Hallpike Right   Dix-Hallpike Right Duration 10 seconds    Dix-Hallpike Right Symptoms  Upbeat, right rotatory nystagmus      Dix-Hallpike Left   Dix-Hallpike Left Symptoms No nystagmus      Positional Sensitivities   Right Hallpike Moderate dizziness    Up from Right Hallpike Lightheadedness    Up from Left Hallpike Lightheadedness                Objective measurements completed on examination: See above findings.        Vestibular Treatment/Exercise - 12/20/21 0001       Vestibular Treatment/Exercise   Vestibular Treatment Provided Canalith Repositioning;Habituation    Canalith Repositioning Epley Manuever Right    Habituation Exercises Seated Horizontal Head Turns;Seated Vertical Head Turns    Gaze Exercises Eye/Head Exercise Horizontal;Eye/Head Exercise Vertical       EPLEY MANUEVER RIGHT   Number of Reps  2    Overall Response Improved Symptoms      Seated Horizontal Head Turns   Number of Reps  5      Eye/Head Exercise Horizontal   Reps 5      Eye/Head Exercise Vertical   Reps 5                    PT Education - 12/20/21 1647     Education Details HEP   importance of habituation   Person(s) Educated Patient    Methods Explanation    Comprehension Verbalized understanding              PT Short Term Goals - 12/20/21 1703       PT SHORT TERM GOAL #1   Title Pt to be I in HEP to decrease her dizziness sx to no greater than 4x a week    Time 1    Period Weeks    Status New    Target Date 12/27/21      PT SHORT TERM GOAL #2   Title Dizziness sx to be 50% duration as prior to therapy    Time 1    Period Weeks    Status New               PT Long Term Goals - 12/20/21 1704       PT LONG TERM GOAL #1   Title PT to be I in advance balance habituation exercises to decrease frequency of dizziness to 2-3 x a week    Time 2  Period Weeks    Status New                    Plan - 12/20/21 1657     Clinical Impression Statement Kimberly Best is an 88 yo female who has been having issues with dizziness  since she was a teenager.  Usually Meclizine would help but she is at a point where the medication does not help.  She has been referred to skilled PT for evaluation and treatment.  She has noted increased sx with both cervical flextion and extension and was postiive for Rt [employer].  Kimberly Best will benefit from skilled PT to address these issues with both Eplys manuever and habituation exercises.    Personal Factors and Comorbidities Age;Time since onset of injury/illness/exacerbation;Fitness    Examination-Activity Limitations Bend;Lift    Examination-Participation Restrictions Cleaning    Stability/Clinical Decision Making Evolving/Moderate complexity    Clinical Decision Making Moderate    Rehab Potential Good    PT Frequency 2x / week    PT Duration 2 weeks    PT Treatment/Interventions Manual techniques;Therapeutic exercise;Patient/family education    PT Next Visit Plan assess how Epley manuever did to relieve sx; complete dixhalpike to see if Epleys is indicated; begin side lying habituation exercises    PT Home Exercise Plan eye gaze exercises for both horizontal and vertcal, cervcial head nods.             Patient will benefit from skilled therapeutic intervention in order to improve the following deficits and impairments:  Obesity, Dizziness  Visit Diagnosis: Dizziness and giddiness - Plan: PT plan of care cert/re-cert     Problem List Patient Active Problem List   Diagnosis Date Noted   Vaccine counseling 12/06/2021   Morbid obesity ([place]) 12/06/2021   Gastric erosion    Rib pain on right side 01/17/2021   H. pylori infection 07/14/2020   NSAID long-term use 07/14/2020   Anemia 04/14/2020   Pessary maintenance 04/14/2020   GERD (gastroesophageal reflux disease) 01/14/2020   Osteoarthritis of right shoulder 01/14/2020   Ear pain, bilateral 12/17/2019   Osteoporosis 09/16/2019   Controlled type 2 diabetes mellitus without complication, without long-term  current use of insulin ([place]) 09/16/2019   Hyperlipidemia LDL goal <100 09/16/2019   Problem with vaginal pessary ([place]) 09/03/2018   Fitting and adjustment of pessary 03/10/2015   Uterovaginal prolapse, complete 02/18/2015   Complete uterovaginal prolapse 02/01/2015   Female stress incontinence 06/10/2014   Urge incontinence 06/10/2014   Rayetta Humphrey, PT CLT [phone]  12/20/2021, 5:09 PM  [place], [place], [address] Phone: [phone]   Fax:  [phone]  Name: KASSI ESTEVE MRN: [id number] Date of Birth: 06-Sep-1933

## 2021-12-26 ENCOUNTER — Other Ambulatory Visit: Payer: Self-pay | Admitting: Internal Medicine

## 2021-12-28 ENCOUNTER — Other Ambulatory Visit: Payer: Self-pay

## 2021-12-28 ENCOUNTER — Ambulatory Visit (HOSPITAL_COMMUNITY): Payer: Medicare Other | Admitting: Physical Therapy

## 2021-12-28 ENCOUNTER — Encounter (HOSPITAL_COMMUNITY): Payer: Self-pay | Admitting: Physical Therapy

## 2021-12-28 DIAGNOSIS — R42 Dizziness and giddiness: Secondary | ICD-10-CM

## 2021-12-28 NOTE — Therapy (Signed)
[place], [place], [address] Phone: [phone]   Fax:  [phone]  Physical Therapy Treatment  Patient Details  Name: Kimberly Best MRN: [id number] Date of Birth: [birth year] Referring Provider (PT): Benjamine Mola, [place]   Encounter Date: 12/28/2021   PT End of Session - 12/28/21 1400     Visit Number 2    Number of Visits 4    Date for PT Re-Evaluation 01/19/22    PT Start Time 1315    PT Stop Time 1400    PT Time Calculation (min) 45 min    Activity Tolerance Patient tolerated treatment well             Past Medical History:  Diagnosis Date   Allergy    Anxiety    Arthritis    Cancer ([place])    right breast cancer   Diabetes mellitus without complication ([place])    GERD (gastroesophageal reflux disease)    H. pylori infection 05/2020   S/p treatment with Pylera.  H. pylori stool antigen negative on 12/21/2020.   Hypertension    IDA (iron deficiency anemia)    Hg in 9-10 range since September 2019.   Pessary maintenance 03/10/2015    Past Surgical History:  Procedure Laterality Date   BIOPSY  05/26/2020   Procedure: BIOPSY;  Surgeon: Daneil Dolin, MD;  [facility];  Service: Endoscopy;;   BREAST SURGERY Right    lumpectomy    CHOLECYSTECTOMY     COLONOSCOPY N/A 05/26/2020   Procedure: COLONOSCOPY;  Surgeon: Daneil Dolin, MD; Diverticulosis in sigmoid colon, friable colonic mucosa likely secondary to ASA/NSAID insult, exam was otherwise normal.     ESOPHAGOGASTRODUODENOSCOPY N/A 05/26/2020   Procedure: ESOPHAGOGASTRODUODENOSCOPY (EGD);  Surgeon: Daneil Dolin, MD;  Normal esophagus s/p dilation, mucosal changes in the stomach biopsied, normal examined duodenum.  Pathology revealed H. pylori.   EYE SURGERY     GIVENS CAPSULE STUDY N/A 02/16/2021   Procedure: GIVENS CAPSULE STUDY;  Surgeon: Daneil Dolin, MD;  gastric and duodenal bulbar erosions, 2 nonbleeding small bowel ulcers with  benign appearance suspected to be secondary to chronic aspirin use.    There were no vitals filed for this visit.            Vestibular Assessment - 12/28/21 0001       Symptom Behavior   Subjective history of current problem PT states that she has been completing her exercises and feels better.  She states that her dizziness is decreased about 30 percent.    Type of Dizziness  Lightheadedness;"World moves"    Frequency of Dizziness everyday    Duration of Dizziness couple hours at a time    Symptom Nature Motion provoked;Intermittent    Aggravating Factors Looking up to the ceiling;Moving eyes    Relieving Factors Rest    Progression of Symptoms Better   Meclizine but this does not do as well as it did     Positional Testing   Dix-Hallpike Dix-Hallpike Right;Dix-Hallpike Left      Dix-Hallpike Right   Dix-Hallpike Right Duration 10 seconds    Dix-Hallpike Right Symptoms Upbeat, right rotatory nystagmus      Dix-Hallpike Left   Dix-Hallpike Left Symptoms No nystagmus      Positional Sensitivities   Right Hallpike Lightheadedness    Head Turning x 5 No dizziness    Head Nodding x 5 No dizziness    Pivot Right in Standing No  dizziness    Pivot Left in Standing No dizziness    Rolling Right No dizziness    Rolling Left No dizziness                      [facility] Adult PT Treatment/Exercise - 12/28/21 0001       Exercises   Exercises Knee/Hip;Neck      Neck Exercises: Seated   Neck Retraction 10 reps    Neck Retraction Limitations scapular retraction x 10      Knee/Hip Exercises: Seated   Sit to Sand 5 reps      Manual Therapy   Manual Therapy Soft tissue mobilization    Manual therapy comments completed seperate from all aspects of treatment    Soft tissue mobilization to decrease tightness of mm             Vestibular Treatment/Exercise - 12/28/21 0001       Vestibular Treatment/Exercise   Canalith Repositioning Epley Manuever Right     Habituation Exercises 180 degree Turns;Standing Horizontal Head Turns       EPLEY MANUEVER RIGHT   Number of Reps  1    Overall Response Improved Symptoms                    PT Education - 12/28/21 1400     Education Details updated HEP    Person(s) Educated Patient    Methods Explanation    Comprehension Verbalized understanding              PT Short Term Goals - 12/28/21 1404       PT SHORT TERM GOAL #1   Title Pt to be I in HEP to decrease her dizziness sx to no greater than 4x a week    Time 1    Period Weeks    Status On-going    Target Date 12/27/21      PT SHORT TERM GOAL #2   Title Dizziness sx to be 50% duration as prior to therapy    Time 1    Period Weeks    Status On-going               PT Long Term Goals - 12/28/21 1404       PT LONG TERM GOAL #1   Title PT to be I in advance balance habituation exercises to decrease frequency of dizziness to 2-3 x a week    Time 2    Period Weeks    Status On-going    Target Date 01/03/22                   Plan - 12/28/21 1400     Clinical Impression Statement Pt states that she has been doing her exercises and has noted that she has not been near as dizzy.  Therapist worked on habituation as well as manual with noted tightness B.    Personal Factors and Comorbidities Age;Time since onset of injury/illness/exacerbation;Fitness    Examination-Activity Limitations Bend;Lift    Examination-Participation Restrictions Cleaning    Stability/Clinical Decision Making Evolving/Moderate complexity    Rehab Potential Good    PT Frequency 2x / week    PT Duration 2 weeks    PT Treatment/Interventions Manual techniques;Therapeutic exercise;Patient/family education    PT Next Visit Plan begin side lying habituation exercises continue with manual techniques.    PT Home Exercise Plan eye gaze exercises for both horizontal and vertcal, cervcial head nods.  1/18:  cervical and scapular retraction.              Patient will benefit from skilled therapeutic intervention in order to improve the following deficits and impairments:  Obesity, Dizziness  Visit Diagnosis: Dizziness and giddiness     Problem List Patient Active Problem List   Diagnosis Date Noted   Vaccine counseling 12/06/2021   Morbid obesity ([place]) 12/06/2021   Gastric erosion    Rib pain on right side 01/17/2021   H. pylori infection 07/14/2020   NSAID long-term use 07/14/2020   Anemia 04/14/2020   Pessary maintenance 04/14/2020   GERD (gastroesophageal reflux disease) 01/14/2020   Osteoarthritis of right shoulder 01/14/2020   Ear pain, bilateral 12/17/2019   Osteoporosis 09/16/2019   Controlled type 2 diabetes mellitus without complication, without long-term current use of insulin ([place]) 09/16/2019   Hyperlipidemia LDL goal <100 09/16/2019   Problem with vaginal pessary ([place]) 09/03/2018   Fitting and adjustment of pessary 03/10/2015   Uterovaginal prolapse, complete 02/18/2015   Complete uterovaginal prolapse 02/01/2015   Female stress incontinence 06/10/2014   Urge incontinence 06/10/2014  Rayetta Humphrey, PT CLT [phone]  12/28/2021, 2:05 PM  [place], [place], [address] Phone: [phone]   Fax:  [phone]  Name: Kimberly Best MRN: [id number] Date of Birth: 1933-12-02

## 2022-01-03 ENCOUNTER — Other Ambulatory Visit: Payer: Self-pay

## 2022-01-03 ENCOUNTER — Ambulatory Visit (HOSPITAL_COMMUNITY): Payer: Medicare Other | Admitting: Physical Therapy

## 2022-01-03 DIAGNOSIS — R42 Dizziness and giddiness: Secondary | ICD-10-CM

## 2022-01-03 NOTE — Therapy (Signed)
Kimberly Best, [place], [address] Phone: [phone]   Fax:  [phone]  Physical Therapy Treatment  Patient Details  Name: Kimberly Best MRN: [id number] Date of Birth: [birth year] Referring Provider (PT): Leta Baptist   Encounter Date: 01/03/2022   PT End of Session - 01/03/22 1055     Visit Number 3    Number of Visits 4    Date for PT Re-Evaluation 01/19/22    PT Start Time 1005    PT Stop Time 1105    PT Time Calculation (min) 60 min    Activity Tolerance Patient tolerated treatment well             Past Medical History:  Diagnosis Date   Allergy    Anxiety    Arthritis    Cancer ([place])    right breast cancer   Diabetes mellitus without complication ([place])    GERD (gastroesophageal reflux disease)    H. pylori infection 05/2020   S/p treatment with Pylera.  H. pylori stool antigen negative on 12/21/2020.   Hypertension    IDA (iron deficiency anemia)    Hg in 9-10 range since September 2019.   Pessary maintenance 03/10/2015    Past Surgical History:  Procedure Laterality Date   BIOPSY  05/26/2020   Procedure: BIOPSY;  Surgeon: Daneil Dolin, MD;  [facility];  Service: Endoscopy;;   BREAST SURGERY Right    lumpectomy    CHOLECYSTECTOMY     COLONOSCOPY N/A 05/26/2020   Procedure: COLONOSCOPY;  Surgeon: Daneil Dolin, MD; Diverticulosis in sigmoid colon, friable colonic mucosa likely secondary to ASA/NSAID insult, exam was otherwise normal.     ESOPHAGOGASTRODUODENOSCOPY N/A 05/26/2020   Procedure: ESOPHAGOGASTRODUODENOSCOPY (EGD);  Surgeon: Daneil Dolin, MD;  Normal esophagus s/p dilation, mucosal changes in the stomach biopsied, normal examined duodenum.  Pathology revealed H. pylori.   EYE SURGERY     GIVENS CAPSULE STUDY N/A 02/16/2021   Procedure: GIVENS CAPSULE STUDY;  Surgeon: Daneil Dolin, MD;  gastric and duodenal bulbar erosions, 2 nonbleeding small bowel ulcers with  benign appearance suspected to be secondary to chronic aspirin use.    There were no vitals filed for this visit.   Subjective Assessment - 01/03/22 1010     Subjective PT states that she started feeling dizzy this morning for the first time since she started therapy.   She has high blood pressure and asks to have her BP taken 150/75                     Vestibular Assessment - 01/03/22 0001       Positional Testing   Dix-Hallpike Dix-Hallpike Right;Dix-Hallpike Left    Sidelying Test Sidelying Left      Dix-Hallpike Right   Dix-Hallpike Right Duration 10    Dix-Hallpike Right Symptoms Upbeat, right rotatory nystagmus      Dix-Hallpike Left   Dix-Hallpike Left Duration 15    Dix-Hallpike Left Symptoms Upbeat, left rotatory nystagmus      Sidelying Left   Sidelying Left Duration 15    Sidelying Left Symptoms Upbeat Nystagmus      Positional Sensitivities   Rolling Left Vomiting      Orthostatics   BP sitting 170/85                                 PT  Short Term Goals - 12/28/21 1404       PT SHORT TERM GOAL #1   Title Pt to be I in HEP to decrease her dizziness sx to no greater than 4x a week    Time 1    Period Weeks    Status On-going    Target Date 12/27/21      PT SHORT TERM GOAL #2   Title Dizziness sx to be 50% duration as prior to therapy    Time 1    Period Weeks    Status On-going               PT Long Term Goals - 12/28/21 1404       PT LONG TERM GOAL #1   Title PT to be I in advance balance habituation exercises to decrease frequency of dizziness to 2-3 x a week    Time 2    Period Weeks    Status On-going    Target Date 01/03/22                   Plan - 01/03/22 1057     Clinical Impression Statement PT stated she had been very dizzy this morning.  Positive of all  testing but more so for Lt dixhalpike.  PT became nauseated and began vomitting.  Therapist waited until sx subsided and  attempted to perform Eply manuever,however, with any extension of the cervical spine pt became extremely dizzy and began vomiting/ dry /heaving.  Therapist attempted to complete modified vertebral artery test but pt once again became dizzy.  Attempted to call referring MD but office is closed.  Will call tomorrow to see if MD wants any other testing completed.    Personal Factors and Comorbidities Age;Time since onset of injury/illness/exacerbation;Fitness    Examination-Activity Limitations Bend;Lift    Examination-Participation Restrictions Cleaning    Stability/Clinical Decision Making Evolving/Moderate complexity    Rehab Potential Good    PT Frequency 2x / week    PT Duration 2 weeks    PT Treatment/Interventions Manual techniques;Therapeutic exercise;Patient/family education    PT Next Visit Plan Assess dix halpike, begin side lying habituation    PT Home Exercise Plan eye gaze exercises for both horizontal and vertcal, cervcial head nods. 1/18:  cervical and scapular retraction.             Patient will benefit from skilled therapeutic intervention in order to improve the following deficits and impairments:  Obesity, Dizziness  Visit Diagnosis: Dizziness and giddiness     Problem List Patient Active Problem List   Diagnosis Date Noted   Vaccine counseling 12/06/2021   Morbid obesity ([place]) 12/06/2021   Gastric erosion    Rib pain on right side 01/17/2021   H. pylori infection 07/14/2020   NSAID long-term use 07/14/2020   Anemia 04/14/2020   Pessary maintenance 04/14/2020   GERD (gastroesophageal reflux disease) 01/14/2020   Osteoarthritis of right shoulder 01/14/2020   Ear pain, bilateral 12/17/2019   Osteoporosis 09/16/2019   Controlled type 2 diabetes mellitus without complication, without long-term current use of insulin ([place]) 09/16/2019   Hyperlipidemia LDL goal <100 09/16/2019   Problem with vaginal pessary ([place]) 09/03/2018   Fitting and adjustment of pessary  03/10/2015   Uterovaginal prolapse, complete 02/18/2015   Complete uterovaginal prolapse 02/01/2015   Female stress incontinence 06/10/2014   Urge incontinence 06/10/2014   Rayetta Humphrey, PT CLT [phone]  01/03/2022, 5:09 PM  [place] [place]  [address] [place], [place], [address] Phone: [phone]   Fax:  [phone]  Name: Kimberly Best MRN: [id number] Date of Birth: [birth year]

## 2022-01-04 ENCOUNTER — Telehealth (HOSPITAL_COMMUNITY): Payer: Self-pay | Admitting: Physical Therapy

## 2022-01-04 ENCOUNTER — Other Ambulatory Visit: Payer: Self-pay | Admitting: Otolaryngology

## 2022-01-04 ENCOUNTER — Encounter (HOSPITAL_COMMUNITY): Payer: Self-pay | Admitting: Physical Therapy

## 2022-01-04 ENCOUNTER — Other Ambulatory Visit (HOSPITAL_COMMUNITY): Payer: Self-pay | Admitting: Otolaryngology

## 2022-01-04 ENCOUNTER — Encounter (HOSPITAL_COMMUNITY): Payer: Medicare Other | Admitting: Physical Therapy

## 2022-01-04 DIAGNOSIS — R42 Dizziness and giddiness: Secondary | ICD-10-CM

## 2022-01-04 NOTE — Telephone Encounter (Signed)
Called pt re missed appointment.  PT is still dizzy and hospital called re MRI of her brain for Feb 6th.  PT would like to wait for results prior to returning for treatment.  Rayetta Humphrey, [place] CLT [phone]

## 2022-01-04 NOTE — Therapy (Signed)
[place], [place], [address] Phone: [phone]   Fax:  [phone]  Patient Details  Name: Kimberly Best MRN: [id number] Date of Birth: 06-30-1933 Referring Provider:  No ref. provider found  Encounter Date: 01/04/2022   Called MD re Kimberly Best having severe dizziness with only 30 degrees cervical extension, this is before she is placed in any SB or rotation.  Dizziness is so severe that she is vomiting.  MD states he will call pt and order an MRI to rule our any other source causing the vertigo. Rayetta Humphrey, PT CLT [phone]  01/04/2022, 9:15 AM  [place], [place], [address] Phone: [phone]   Fax:  [phone]

## 2022-01-16 ENCOUNTER — Ambulatory Visit (HOSPITAL_COMMUNITY)
Admission: RE | Admit: 2022-01-16 | Discharge: 2022-01-16 | Disposition: A | Discharge status: Home / Self Care | Payer: Medicare Other | Source: Ambulatory Visit | Attending: Otolaryngology | Admitting: Otolaryngology

## 2022-01-16 ENCOUNTER — Other Ambulatory Visit: Payer: Self-pay

## 2022-01-16 DIAGNOSIS — R42 Dizziness and giddiness: Secondary | ICD-10-CM | POA: Diagnosis not present

## 2022-01-16 MED ORDER — GADOBUTROL 1 MMOL/ML IV SOLN
7.0000 mL | Freq: Once | INTRAVENOUS | Status: AC | PRN
  Administered 2022-01-16: 7 mL via INTRAVENOUS

## 2022-01-23 ENCOUNTER — Other Ambulatory Visit: Payer: Self-pay

## 2022-01-23 ENCOUNTER — Ambulatory Visit (INDEPENDENT_AMBULATORY_CARE_PROVIDER_SITE_OTHER): Payer: Medicare Other | Admitting: Obstetrics & Gynecology

## 2022-01-23 ENCOUNTER — Encounter: Payer: Self-pay | Admitting: Obstetrics & Gynecology

## 2022-01-23 VITALS — BP 142/84 | HR 92 | Ht <= 58 in

## 2022-01-23 DIAGNOSIS — N813 Complete uterovaginal prolapse: Secondary | ICD-10-CM | POA: Diagnosis not present

## 2022-01-23 DIAGNOSIS — Z4689 Encounter for fitting and adjustment of other specified devices: Secondary | ICD-10-CM

## 2022-01-23 NOTE — Progress Notes (Signed)
Chief Complaint  Patient presents with   Pessary Check    Blood pressure (!) 142/84, pulse 92, height 4\' 8"  (1.422 m).  Kimberly Best presents today for routine follow up related to her pessary.   She uses a Milex ring with support #5 She reports no vaginal discharge and no vaginal bleeding   Likert scale(1 not bothersome -5 very bothersome)  :  1  Exam reveals no undue vaginal mucosal pressure of breakdown, no discharge and no vaginal bleeding.  Vaginal Epithelial Abnormality Classification System:   0 0    No abnormalities 1    Epithelial erythema 2    Granulation tissue 3    Epithelial break or erosion, 1 cm or less 4    Epithelial break or erosion, 1 cm or greater  The pessary is removed, cleaned and replaced without difficulty.      ICD-10-CM   1. Pessary maintenance, refit 10/18/20 with Milex ring with support #5  Z46.89     2. Uterovaginal prolapse, complete  N81.3        Kimberly Best will be seen back in 4 months for continued follow up.  Florian Buff, MD  01/23/2022 8:54 AM

## 2022-01-26 ENCOUNTER — Encounter: Payer: Self-pay | Admitting: Family Medicine

## 2022-01-26 ENCOUNTER — Other Ambulatory Visit: Payer: Self-pay

## 2022-01-26 ENCOUNTER — Ambulatory Visit (INDEPENDENT_AMBULATORY_CARE_PROVIDER_SITE_OTHER): Payer: Medicare Other | Admitting: Family Medicine

## 2022-01-26 VITALS — BP 137/68 | HR 75 | Ht <= 58 in | Wt 175.0 lb

## 2022-01-26 DIAGNOSIS — E785 Hyperlipidemia, unspecified: Secondary | ICD-10-CM | POA: Diagnosis not present

## 2022-01-26 DIAGNOSIS — E119 Type 2 diabetes mellitus without complications: Secondary | ICD-10-CM | POA: Diagnosis not present

## 2022-01-26 DIAGNOSIS — Z9109 Other allergy status, other than to drugs and biological substances: Secondary | ICD-10-CM

## 2022-01-26 DIAGNOSIS — Z7185 Encounter for immunization safety counseling: Secondary | ICD-10-CM | POA: Diagnosis not present

## 2022-01-26 DIAGNOSIS — R42 Dizziness and giddiness: Secondary | ICD-10-CM

## 2022-01-26 DIAGNOSIS — K219 Gastro-esophageal reflux disease without esophagitis: Secondary | ICD-10-CM | POA: Diagnosis not present

## 2022-01-26 MED ORDER — MECLIZINE HCL 25 MG PO TABS: 25.0000 mg | ORAL_TABLET | Freq: Three times a day (TID) | ORAL | 2 refills | Status: DC | PRN

## 2022-01-26 MED ORDER — MONTELUKAST SODIUM 10 MG PO TABS: 10.0000 mg | ORAL_TABLET | Freq: Every day | ORAL | 3 refills | Status: DC

## 2022-01-26 NOTE — Patient Instructions (Addendum)
F/U early May, with PCP, call if you jneed [place] sooner (Pt aware she will see another Provider as her PCP)  Meclizine and montelukast  are prescribed  Fasting lipid, cmp and EGFR, HBA1C and microalb 5 days before follow up  Careful not to fall  Keep up the good work!  Thanks for choosing [facility], we consider it a privelige to serve you.

## 2022-01-30 ENCOUNTER — Encounter: Payer: Self-pay | Admitting: Family Medicine

## 2022-01-30 DIAGNOSIS — R42 Dizziness and giddiness: Secondary | ICD-10-CM | POA: Insufficient documentation

## 2022-01-30 DIAGNOSIS — Z9109 Other allergy status, other than to drugs and biological substances: Secondary | ICD-10-CM | POA: Insufficient documentation

## 2022-01-30 NOTE — Assessment & Plan Note (Signed)
Controlled, no change in medication  

## 2022-01-30 NOTE — Assessment & Plan Note (Signed)
Ms. Henken is reminded of the importance of commitment to daily physical activity for 30 minutes or more, as able and the need to limit carbohydrate intake to 30 to 60 grams per meal to help with blood sugar control.  P to remain diet cotrolled at this time  TMs. Engelmann is reminded of the importance of daily foot exam, annual eye examination, and good blood sugar, blood pressure and cholesterol control.  Diabetic Labs Latest Ref Rng & Units 12/01/2021 07/18/2021 06/28/2021 06/09/2021 01/27/2021  HbA1c 4.8 - 5.6 % 6.6(H) - - 6.3(H) 6.5(H)  Microalbumin mg/dL - - - - 4.7  Micro/Creat Ratio <30 mcg/mg creat - - - - 43(H)  Chol 100 - 199 mg/dL 238(H) - - 192 -  HDL >39 mg/dL 64 - - 48(L) -  Calc LDL 0 - 99 mg/dL 159(H) - - 118(H) -  Triglycerides 0 - 149 mg/dL 85 - - 151(H) -  Creatinine 0.57 - 1.00 mg/dL 0.75 0.94 1.02(H) 0.78 0.84   BP/Weight 01/26/2022 01/23/2022 12/14/2021 12/01/2021 10/21/2021 01/20/4708 05/12/8365  Systolic BP 294 765 465 035 465 681 275  Diastolic BP 68 84 80 82 87 67 60  Wt. (Lbs) 175 - - 173.04 171.5 169.2 176  BMI 39.23 38.79 - 38.79 38.45 40.8 39.46   Foot/eye exam completion dates Latest Ref Rng & Units 12/13/2021 10/20/2021  Eye Exam No Retinopathy No Retinopathy No Retinopathy  Foot Form Completion - - -

## 2022-01-30 NOTE — Assessment & Plan Note (Signed)
Start daily singulair as needed, also limited  meclizine prescribed for vertigo if occurs

## 2022-01-30 NOTE — Assessment & Plan Note (Signed)
Not currently experiencing vertigo, however limited amt of meclizine prescribed for as needed use

## 2022-01-30 NOTE — Assessment & Plan Note (Signed)
°  Patient re-educated about  the importance of commitment to a  minimum of 150 minutes of exercise per week as able.  The importance of healthy food choices with portion control discussed, as well as eating regularly and within a 12 hour window most days. The need to choose "clean , green" food 50 to 75% of the time is discussed, as well as to make water the primary drink and set a goal of 64 ounces water daily.    Weight /BMI 01/26/2022 01/23/2022 12/01/2021  WEIGHT 175 lb - 173 lb 0.6 oz  HEIGHT 4\' 8"  4\' 8"  4\' 8"   BMI 39.23 kg/m2 38.79 kg/m2 38.79 kg/m2

## 2022-01-30 NOTE — Assessment & Plan Note (Signed)
Offered , educated , but refuses vaccines

## 2022-01-30 NOTE — Progress Notes (Signed)
Kimberly Best     MRN: [id number]      DOB: 10-13-1933   HPI [name] is here for follow up and re-evaluation of chronic medical conditions, medication management and review of any available recent lab and radiology data.  Preventive health is updated, specifically  Cancer screening and Immunization.   C/o intermittent vertigo and requests meclizine for as needed use. The PT denies any adverse reactions to current medications since the last visit.  4 day h/o right flank pain   ROS Denies recent fever or chills. C/o intermittent vertigo and requesrsmeclizine for as needed use, also inceased allergy symptoms as expected at this time of the year Denies sinus pressure, nasal congestion, ear pain or sore throat. Denies chest congestion, productive cough or wheezing. Denies chest pains, palpitations and leg swelling Denies Denies dysuria, frequency, hesitancy or incontinence. Denies joint pain, swelling and limitation in mobility. Denies headaches, seizures, numbness, or tingling. Denies depression, anxiety or insomnia. Denies skin break down or rash.   PE  BP 137/68 (BP Location: Left Arm, Patient Position: Sitting, Cuff Size: Large)    Pulse 75    Ht 4\' 8"  (1.422 m)    Wt 175 lb (79.4 kg)    SpO2 97%    BMI 39.23 kg/m   Patient alert and oriented and in no cardiopulmonary distress.  HEENT: No facial asymmetry, EOMI,     Neck supple .No sinus tenderness, nasal congestion present  Chest: Clear to auscultation bilaterally.  CVS: S1, S2 no murmurs, no S3.Regular rate.  ABD: Soft no renal angle tenderness.   Ext: No edema  MS: Adequate  though reduced ROM spine, shoulders, hips and knees.  Skin: Intact, no ulcerations or rash noted.  Psych: Good eye contact, normal affect. Memory intact not anxious or depressed appearing.  CNS: CN 2-12 intact, power,  normal throughout.no focal deficits noted.   Assessment & Plan  Controlled type 2 diabetes mellitus without complication,  without long-term current use of insulin ([place]) [name] is reminded of the importance of commitment to daily physical activity for 30 minutes or more, as able and the need to limit carbohydrate intake to 30 to 60 grams per meal to help with blood sugar control.  P to remain diet cotrolled at this time  [name] is reminded of the importance of daily foot exam, annual eye examination, and good blood sugar, blood pressure and cholesterol control.  Diabetic Labs Latest Ref Rng & Units 12/01/2021 07/18/2021 06/28/2021 06/09/2021 01/27/2021  HbA1c 4.8 - 5.6 % 6.6(H) - - 6.3(H) 6.5(H)  Microalbumin mg/dL - - - - 4.7  Micro/Creat Ratio <30 mcg/mg creat - - - - 43(H)  Chol 100 - 199 mg/dL 238(H) - - 192 -  HDL >39 mg/dL 64 - - 48(L) -  Calc LDL 0 - 99 mg/dL 159(H) - - 118(H) -  Triglycerides 0 - 149 mg/dL 85 - - 151(H) -  Creatinine 0.57 - 1.00 mg/dL 0.75 0.94 1.02(H) 0.78 0.84   BP/Weight 01/26/2022 01/23/2022 12/14/2021 12/01/2021 10/21/2021 9/37/1696 06/17/9380  Systolic BP 017 510 258 527 782 423 536  Diastolic BP 68 84 80 82 87 67 60  Wt. (Lbs) 175 - - 173.04 171.5 169.2 176  BMI 39.23 38.79 - 38.79 38.45 40.8 39.46   Foot/eye exam completion dates Latest Ref Rng & Units 12/13/2021 10/20/2021  Eye Exam No Retinopathy No Retinopathy No Retinopathy  Foot Form Completion - - -        Hyperlipidemia  LDL goal <100 Hyperlipidemia:Low fat diet discussed and encouraged.   Lipid Panel  Lab Results  Component Value Date   CHOL 238 (H) 12/01/2021   HDL 64 12/01/2021   LDLCALC 159 (H) 12/01/2021   TRIG 85 12/01/2021   CHOLHDL 3.7 12/01/2021  needs to reduce fried and fatty foods and will need updated lab, uncontrolled      GERD (gastroesophageal reflux disease) Controlled, no change in medication   Environmental allergies Start daily singulair as needed, also limited  meclizine prescribed for vertigo if occurs  Vaccine counseling Offered , educated , but refuses  vaccines  Intermittent vertigo Not currently experiencing vertigo, however limited amt of meclizine prescribed for as needed use   Morbid obesity ([place])  Patient re-educated about  the importance of commitment to a  minimum of 150 minutes of exercise per week as able.  The importance of healthy food choices with portion control discussed, as well as eating regularly and within a 12 hour window most days. The need to choose "clean , green" food 50 to 75% of the time is discussed, as well as to make water the primary drink and set a goal of 64 ounces water daily.    Weight /BMI 01/26/2022 01/23/2022 12/01/2021  WEIGHT 175 lb - 173 lb 0.6 oz  HEIGHT 4\' 8"  4\' 8"  4\' 8"   BMI 39.23 kg/m2 38.79 kg/m2 38.79 kg/m2

## 2022-01-30 NOTE — Progress Notes (Signed)
Referring Provider: [name], NP Primary Care Physician:  Renee Rival, FNP Primary GI Physician: Dr. Gala Romney  Chief Complaint  Patient presents with   Follow-up    HPI:   Kimberly Best is a 88 y.o. female presenting today for routine follow-up. History of IDA with hemoglobin in the 9-10 range since September 2019 and found to be iron deficient in April 2021, GERD, dysphagia s/p empiric dilation in June 2021, H. pylori in June 2021 s/p treatment with Pylera, documented eradication via stool antigen test January 2022.   EGD and colonoscopy were both performed in June 2021 for IDA.  EGD with normal examined esophagus, mild mucosal changes in the stomach biopsied revealing H. pylori, normal duodenum.  Colonoscopy with diverticulosis in the sigmoid colon, friable colonic mucosa likely secondary to ASA/NSAID insult, otherwise normal exam.  No recommendations to repeat.  Givens capsule completed in March 2022 with gastric and duodenal bulbar erosions, and 2 nonbleeding small bowel ulcers with benign appearance.  Findings likely secondary to ongoing aspirin use.  She was continued on PPI daily, iron daily. ASA was discontinued after discussion with PCP.   Last seen in our office 08/03/2021.  Denied overt GI bleeding or any other obvious blood loss.  She is taking 2 iron daily.  Admitted to occasional constipation with iron, but nothing routine.  No other significant GI symptoms.  No unintentional weight loss.  GERD well controlled on Protonix 40 mg daily without dysphagia.  Denied NSAIDs.  Most recent hemoglobin was fairly stable at 9.8.  Recommended repeat CBC in 3 months, continue PPI daily, continue iron daily, follow-up in 6 months.  Labs 10/04/21: Hemoglobin improved to 10.9 (9.8 in August), iron 92, saturation 30%, ferritin 85.  Today:   GERD:  Well controlled. No abdominal pain, nausea, vomiting, or dysphagia.   IDA:  Denies BRBPR, melena, hematuria, vaginal bleeding,  nosebleeds. Taking 1 iron daily.   Bowels are moving well. No unintentional weight loss.   Past Medical History:  Diagnosis Date   Allergy    Anxiety    Arthritis    Cancer ([place])    right breast cancer   Diabetes mellitus without complication ([place])    GERD (gastroesophageal reflux disease)    H. pylori infection 05/2020   S/p treatment with Pylera.  H. pylori stool antigen negative on 12/21/2020.   HLD (hyperlipidemia)    Hypertension    IDA (iron deficiency anemia)    Hg in 9-10 range since September 2019.   Pessary maintenance 03/10/2015    Past Surgical History:  Procedure Laterality Date   BIOPSY  05/26/2020   Procedure: BIOPSY;  Surgeon: Daneil Dolin, MD;  [facility];  Service: Endoscopy;;   BREAST SURGERY Right    lumpectomy    CHOLECYSTECTOMY     COLONOSCOPY N/A 05/26/2020   Procedure: COLONOSCOPY;  Surgeon: Daneil Dolin, MD; Diverticulosis in sigmoid colon, friable colonic mucosa likely secondary to ASA/NSAID insult, exam was otherwise normal.     ESOPHAGOGASTRODUODENOSCOPY N/A 05/26/2020   Procedure: ESOPHAGOGASTRODUODENOSCOPY (EGD);  Surgeon: Daneil Dolin, MD;  Normal esophagus s/p dilation, mucosal changes in the stomach biopsied, normal examined duodenum.  Pathology revealed H. pylori.   EYE SURGERY     GIVENS CAPSULE STUDY N/A 02/16/2021   Procedure: GIVENS CAPSULE STUDY;  Surgeon: Daneil Dolin, MD;  gastric and duodenal bulbar erosions, 2 nonbleeding small bowel ulcers with benign appearance suspected to be secondary to chronic aspirin use.  Current Outpatient Medications  Medication Sig Dispense Refill   ACCU-CHEK AVIVA PLUS test strip USE AS DIRECTED TO TEST TWICE DAILY. 100 strip 0   Accu-Chek Softclix Lancets lancets USE AS DIRECTED TO TEST TWICE DAILY. 100 each 0   acetaminophen (TYLENOL) 650 MG CR tablet Take 650 mg by mouth 2 (two) times daily as needed for pain.     amLODipine (NORVASC) 2.5 MG tablet Take 1 tablet (2.5 mg  total) by mouth daily. 30 tablet 1   cholecalciferol (VITAMIN D) 1000 UNITS tablet Take 1,000 Units by mouth daily.     clotrimazole-betamethasone (LOTRISONE) cream For up to 14 days and then stop the medication. 45 g 0   meclizine (ANTIVERT) 25 MG tablet Take 1 tablet (25 mg total) by mouth 3 (three) times daily as needed for dizziness. 30 tablet 2   montelukast (SINGULAIR) 10 MG tablet Take 1 tablet (10 mg total) by mouth at bedtime. 30 tablet 3   Multiple Vitamins-Minerals (IMMUNE SUPPORT PO) Take 1 tablet by mouth daily.     pantoprazole (PROTONIX) 40 MG tablet TAKE ONE TABLET (40MG  TOTAL) BY MOUTH DAILY BEFORE BREAKFAST 90 tablet 0   rosuvastatin (CRESTOR) 5 MG tablet Take 1 tablet (5 mg total) by mouth daily. 30 tablet 3   SLOW FE 142 (45 Fe) MG TBCR Take 2 tablets by mouth every morning. (Patient taking differently: Take 1 tablet by mouth every morning.) 180 tablet 1   No current facility-administered medications for this visit.    Allergies as of 02/01/2022 - Review Complete 02/01/2022  Allergen Reaction Noted   Lisinopril Other (See Comments) 12/01/2021   Penicillins Swelling 11/18/2014    Family History  Problem Relation Age of Onset   Cancer Father    Stroke Mother    Hypertension Mother    Diabetes Sister    Hypertension Sister    Cancer Brother    Hypertension Brother    Bipolar disorder Daughter    Hypertension Maternal Grandmother    Tuberculosis Paternal Grandmother    Hypertension Paternal Grandmother    Alcohol abuse Paternal Grandfather    Hypertension Paternal Grandfather    Colon cancer Neg Hx     Social History   Socioeconomic History   Marital status: Married    Spouse name: Not on file   Number of children: Not on file   Years of education: Not on file   Highest education level: Not on file  Occupational History   Not on file  Tobacco Use   Smoking status: Never   Smokeless tobacco: Never  Vaping Use   Vaping Use: Never used  Substance and  Sexual Activity   Alcohol use: Not Currently    Comment: Couple ounces of champagne once every couple of weeks or less.   Drug use: No   Sexual activity: Not Currently    Birth control/protection: Post-menopausal  Other Topics Concern   Not on file  Social History Narrative   Widow since 2015,married for 34 years.Lives alone.Retired.   Social Determinants of Health   Financial Resource Strain: Not on file  Food Insecurity: Not on file  Transportation Needs: Not on file  Physical Activity: Not on file  Stress: Not on file  Social Connections: Not on file    Review of Systems: Gen: Denies fever, chills, cold or flulike symptoms, presyncope, syncope. CV: Denies chest pain, palpitations. Resp: Denies dyspnea or cough. GI: See HPI Heme: See HPI  Physical Exam: BP 126/68    Temp Marland Kitchen)  97.5 F (36.4 C) (Temporal)    Ht 4\' 8"  (1.422 m)    Wt 174 lb 12.8 oz (79.3 kg)    BMI 39.19 kg/m  General:   Alert and oriented. No distress noted. Pleasant and cooperative.  Walking with a cane. Head:  Normocephalic and atraumatic. Eyes:  Conjuctiva clear without scleral icterus. Heart:  S1, S2 present without murmurs appreciated. Lungs:  Clear to auscultation bilaterally. No wheezes, rales, or rhonchi. No distress.  Abdomen:  +BS, soft, non-tender and non-distended. No rebound or guarding. No HSM or masses noted. Msk:  Symmetrical without gross deformities. Normal posture. Extremities:  With trace ankle edema. Neurologic:  Alert and  oriented x4 Psych:  Normal mood and affect.    Assessment: 88 year old female presenting today for 6 month follow-up of IDA and GERD.  IDA: Hemoglobin in the 9-10 range since September 2019, found to have component of IDA in April 2021.  B12 WNL.  Kidney function WNL.  She has undergone complete GI evaluation including colonoscopy in June 2021 with diverticulosis, friable colonic mucosa likely secondary to ASA/NSAID insult, otherwise normal.  EGD June 2021 with  normal esophagus s/p dilation, mucosal changes in the stomach biopsied revealing H. pylori, otherwise normal exam.  Completed Pylera with follow-up stool testing confirming H. pylori eradication.  Givens capsule March 2022 with gastric and duodenal bulbar erosions, 2 nonbleeding small bowel ulcers with benign appearance suspected to be secondary to chronic aspirin use.  PCP since discontinued aspirin and patient has avoided all other NSAIDs.  She has continued on PPI daily and Slow Fe daily.  Clinically she is doing very well and denies overt GI bleeding or any other obvious blood loss.  Last labs in October 2022 with hemoglobin improved to 10.9 from 9.8 in August 2022, iron 92, saturation 30%, ferritin 85.  We will have her continue iron daily for now, update labs, further recommendations to follow.  GERD:  Chronic.  Well-controlled on Protonix 40 mg daily.  No alarm symptoms.   Plan: CBC and iron panel Continue iron daily for now. Continue Protonix 40 mg daily. Follow-up in 6 months or sooner if needed.   Aliene Altes, PA-C [facility] 02/01/2022

## 2022-01-30 NOTE — Assessment & Plan Note (Signed)
Hyperlipidemia:Low fat diet discussed and encouraged.   Lipid Panel  Lab Results  Component Value Date   CHOL 238 (H) 12/01/2021   HDL 64 12/01/2021   LDLCALC 159 (H) 12/01/2021   TRIG 85 12/01/2021   CHOLHDL 3.7 12/01/2021  needs to reduce fried and fatty foods and will need updated lab, uncontrolled

## 2022-02-01 ENCOUNTER — Encounter: Payer: Self-pay | Admitting: Gastroenterology

## 2022-02-01 ENCOUNTER — Other Ambulatory Visit: Payer: Self-pay

## 2022-02-01 ENCOUNTER — Ambulatory Visit: Payer: Medicare Other | Admitting: Gastroenterology

## 2022-02-01 VITALS — BP 126/68 | Temp 97.5°F | Ht <= 58 in | Wt 174.8 lb

## 2022-02-01 DIAGNOSIS — K219 Gastro-esophageal reflux disease without esophagitis: Secondary | ICD-10-CM | POA: Diagnosis not present

## 2022-02-01 DIAGNOSIS — D509 Iron deficiency anemia, unspecified: Secondary | ICD-10-CM | POA: Diagnosis not present

## 2022-02-01 LAB — CBC WITH DIFFERENTIAL/PLATELET
Absolute Monocytes: 652 cells/uL (ref 200–950)
Basophils Absolute: 11 cells/uL (ref 0–200)
Basophils Relative: 0.2 %
Eosinophils Absolute: 58 cells/uL (ref 15–500)
Eosinophils Relative: 1.1 %
HCT: 33.6 % — ABNORMAL LOW (ref 35.0–45.0)
Hemoglobin: 11 g/dL — ABNORMAL LOW (ref 11.7–15.5)
Lymphs Abs: 2030 cells/uL (ref 850–3900)
MCH: 29.3 pg (ref 27.0–33.0)
MCHC: 32.7 g/dL (ref 32.0–36.0)
MCV: 89.4 fL (ref 80.0–100.0)
MPV: 9.6 fL (ref 7.5–12.5)
Monocytes Relative: 12.3 %
Neutro Abs: 2549 cells/uL (ref 1500–7800)
Neutrophils Relative %: 48.1 %
Platelets: 272 10*3/uL (ref 140–400)
RBC: 3.76 10*6/uL — ABNORMAL LOW (ref 3.80–5.10)
RDW: 14 % (ref 11.0–15.0)
Total Lymphocyte: 38.3 %
WBC: 5.3 10*3/uL (ref 3.8–10.8)

## 2022-02-01 LAB — IRON,TIBC AND FERRITIN PANEL
%SAT: 25 % (calc) (ref 16–45)
Ferritin: 79 ng/mL (ref 16–288)
Iron: 76 ug/dL (ref 45–160)
TIBC: 307 mcg/dL (calc) (ref 250–450)

## 2022-02-01 NOTE — Patient Instructions (Signed)
Please have blood work completed at [facility].  We will call you with results.  Continue Protonix 40 mg daily 30 minutes for breakfast.  Continue iron daily for now.  We will follow-up with you in 6 months or sooner if needed.  Do not hesitate to call if you have questions or concerns prior to your next visit.  Aliene Altes, PA-C [facility]

## 2022-02-08 ENCOUNTER — Other Ambulatory Visit: Payer: Self-pay | Admitting: Family Medicine

## 2022-02-27 ENCOUNTER — Other Ambulatory Visit: Payer: Self-pay | Admitting: Family Medicine

## 2022-02-27 ENCOUNTER — Other Ambulatory Visit: Payer: Self-pay | Admitting: Internal Medicine

## 2022-02-27 DIAGNOSIS — K219 Gastro-esophageal reflux disease without esophagitis: Secondary | ICD-10-CM

## 2022-03-27 ENCOUNTER — Encounter: Payer: Self-pay | Admitting: Family Medicine

## 2022-03-27 ENCOUNTER — Ambulatory Visit (INDEPENDENT_AMBULATORY_CARE_PROVIDER_SITE_OTHER): Payer: Medicare Other | Admitting: Family Medicine

## 2022-03-27 VITALS — Ht <= 58 in | Wt 174.0 lb

## 2022-03-27 DIAGNOSIS — J302 Other seasonal allergic rhinitis: Secondary | ICD-10-CM

## 2022-03-27 DIAGNOSIS — R42 Dizziness and giddiness: Secondary | ICD-10-CM | POA: Diagnosis not present

## 2022-03-27 MED ORDER — MECLIZINE HCL 25 MG PO TABS: 25.0000 mg | ORAL_TABLET | Freq: Three times a day (TID) | ORAL | 2 refills | Status: DC | PRN

## 2022-03-27 NOTE — Progress Notes (Signed)
?  ? ?Virtual Visit via Telephone Note  ? ?This visit type was conducted due to national recommendations for restrictions regarding the COVID-19 Pandemic (e.g. social distancing) in an effort to limit this patient's exposure and mitigate transmission in our community.  Due to her co-morbid illnesses, this patient is at least at moderate risk for complications without adequate follow up.  This format is felt to be most appropriate for this patient at this time.  The patient did not have access to video technology/had technical difficulties with video requiring transitioning to audio format only (telephone).  All issues noted in this document were discussed and addressed.  No physical exam could be performed with this format.  Please refer to the patient's chart for her  consent to telehealth for [facility].  ? ?Evaluation Performed:  [facility] visit ? ?Date:  03/27/2022  ? ?ID:  Kimberly Best, DOB Aug 28, 1933, MRN [id number] ? ?Patient Location: Home ?Provider Location: Office/Clinic ? ?Participants: Patient ?Location of Patient: Home ?Location of Provider: Telehealth ?Consent was obtain for visit to be over via telehealth. ?I verified that I am speaking with the correct person using two identifiers. ? ?PCP:  Kimberly Rival, FNP  ? ?Chief Complaint:  dizziness ? ?History of Present Illness:   ? ?CHIANNA SPIRITO is a 88 y.o. Female with PMH of intermittent vertigo. The patient presents today complaining of increased dizziness since she was a teenager and clear rhinorrhea with red itchy eyes. She voices adequate fluid intake and states that her dizziness intensifies when she leans back to apply her eye drops and relief by rest. She reports dizziness throughout the day. The patient has been taking meclizine and Singulair but reports minimal symptom relief. She admits to taking meclizine twice rather than three times daily, stating that she does not like having too many medications in her body. She has followed- up  with ENT concerning her dizziness and reported no abnormal findings and started PT  at [facility]. She states that she had severe dizziness, elevated BP and vomiting at her third PT appointment; MRI was done with no acute intracranial abnormality. ? ?The patient does not have symptoms concerning for COVID-19 infection (fever, chills, cough, or new shortness of breath).  ? ?Past Medical, Surgical, Social History, Allergies, and Medications have been Reviewed. ? ?Past Medical History:  ?Diagnosis Date  ? Allergy   ? Anxiety   ? Arthritis   ? Cancer [facility])   ? right breast cancer  ? Diabetes mellitus without complication ([place])   ? GERD (gastroesophageal reflux disease)   ? H. pylori infection 05/2020  ? S/p treatment with Pylera.  H. pylori stool antigen negative on 12/21/2020.  ? HLD (hyperlipidemia)   ? Hypertension   ? IDA (iron deficiency anemia)   ? Hg in 9-10 range since September 2019.  ? Pessary maintenance 03/10/2015  ? ?Past Surgical History:  ?Procedure Laterality Date  ? BIOPSY  05/26/2020  ? Procedure: BIOPSY;  Surgeon: Daneil Dolin, MD;  [facility];  Service: Endoscopy;;  ? BREAST SURGERY Right   ? lumpectomy   ? CHOLECYSTECTOMY    ? COLONOSCOPY N/A 05/26/2020  ? Procedure: COLONOSCOPY;  Surgeon: Daneil Dolin, MD; Diverticulosis in sigmoid colon, friable colonic mucosa likely secondary to ASA/NSAID insult, exam was otherwise normal.    ? ESOPHAGOGASTRODUODENOSCOPY N/A 05/26/2020  ? Procedure: ESOPHAGOGASTRODUODENOSCOPY (EGD);  Surgeon: Daneil Dolin, MD;  Normal esophagus s/p dilation, mucosal changes in the stomach  biopsied, normal examined duodenum.  Pathology revealed H. pylori.  ? EYE SURGERY    ? GIVENS CAPSULE STUDY N/A 02/16/2021  ? Procedure: GIVENS CAPSULE STUDY;  Surgeon: Daneil Dolin, MD;  gastric and duodenal bulbar erosions, 2 nonbleeding small bowel ulcers with benign appearance suspected to be secondary to chronic aspirin use.  ?   ? ?Current Meds  ?Medication Sig  ? ACCU-CHEK AVIVA PLUS test strip USE AS DIRECTED TO TEST TWICE DAILY.  ? Accu-Chek Softclix Lancets lancets USE AS DIRECTED TO TEST TWICE DAILY.  ? acetaminophen (TYLENOL) 650 MG CR tablet Take 650 mg by mouth 2 (two) times daily as needed for pain.  ? amLODipine (NORVASC) 2.5 MG tablet TAKE ONE TABLET (2.'5MG'$  TOTAL) BY MOUTH DAILY  ? cholecalciferol (VITAMIN D) 1000 UNITS tablet Take 1,000 Units by mouth daily.  ? clotrimazole-betamethasone (LOTRISONE) cream For up to 14 days and then stop the medication.  ? montelukast (SINGULAIR) 10 MG tablet Take 1 tablet (10 mg total) by mouth at bedtime.  ? Multiple Vitamins-Minerals (IMMUNE SUPPORT PO) Take 1 tablet by mouth daily.  ? pantoprazole (PROTONIX) 40 MG tablet TAKE ONE TABLET ('40MG'$  TOTAL) BY MOUTH DAILY BEFORE BREAKFAST  ? rosuvastatin (CRESTOR) 5 MG tablet Take 1 tablet (5 mg total) by mouth daily.  ? SLOW FE 142 (45 Fe) MG TBCR Take 2 tablets by mouth every morning. (Patient taking differently: Take 1 tablet by mouth every morning.)  ? [DISCONTINUED] meclizine (ANTIVERT) 25 MG tablet Take 1 tablet (25 mg total) by mouth 3 (three) times daily as needed for dizziness.  ?  ? ?Allergies:   Lisinopril and Penicillins  ? ?ROS:   ?Please see the history of present illness.    ? ?All other systems reviewed and are negative. ? ? ?Labs/Other Tests and Data Reviewed:   ? ?Recent Labs: ?12/01/2021: ALT 14; BUN 19; Creatinine, Ser 0.75; Potassium 5.0; Sodium 142; TSH 0.865 ?02/01/2022: Hemoglobin 11.0; Platelets 272  ? ?Recent Lipid Panel ?Lab Results  ?Component Value Date/Time  ? CHOL 238 (H) 12/01/2021 10:33 AM  ? TRIG 85 12/01/2021 10:33 AM  ? HDL 64 12/01/2021 10:33 AM  ? CHOLHDL 3.7 12/01/2021 10:33 AM  ? CHOLHDL 4.0 06/09/2021 08:23 AM  ? LDLCALC 159 (H) 12/01/2021 10:33 AM  ? LDLCALC 118 (H) 06/09/2021 08:23 AM  ? ? ?Wt Readings from Last 3 Encounters:  ?03/27/22 174 lb (78.9 kg)  ?02/01/22 174 lb 12.8 oz (79.3 kg)  ?01/26/22 175 lb  (79.4 kg)  ?  ? ?Objective:   ? ?Vital Signs:  Ht '4\' 8"'$  (1.422 m)   Wt 174 lb (78.9 kg)   BMI 39.01 kg/m?   ? ? ? ?ASSESSMENT & PLAN:   ? ?Intermittent Vertigo  ?-Advised to resume and continue Ptor vestibular exercises ?-Refilled Meclizine with instructions to continue treatment regimen three times daily ? Allergies ?-Advice to take OTC Allegra ? ?Time:   ?Today, I have spent 26 minutes reviewing the chart, including problem list, medications, and with the patient with telehealth technology discussing the above problems. ? ? ?Medication Adjustments/Labs and Tests Ordered: ?Current medicines are reviewed at length with the patient today.  Concerns regarding medicines are outlined above.  ? ?Tests Ordered: ?No orders of the defined types were placed in this encounter. ? ? ?Medication Changes: ?Meds ordered this encounter  ?Medications  ? meclizine (ANTIVERT) 25 MG tablet  ?  Sig: Take 1 tablet (25 mg total) by mouth 3 (three) times daily as needed for dizziness.  ?  Dispense:  30 tablet  ?  Refill:  2  ? ? ? ?  ?  ? ? ?Disposition:  Follow up-PRN ?Signed, ?Alvira Monday, FNP  ?03/27/2022 9:58 PM    ? ?[facility] ?[facility] ?

## 2022-04-13 ENCOUNTER — Other Ambulatory Visit: Payer: Self-pay | Admitting: Family Medicine

## 2022-04-13 ENCOUNTER — Encounter: Payer: Self-pay | Admitting: Nurse Practitioner

## 2022-04-13 ENCOUNTER — Ambulatory Visit (INDEPENDENT_AMBULATORY_CARE_PROVIDER_SITE_OTHER): Payer: Medicare Other | Admitting: Nurse Practitioner

## 2022-04-13 VITALS — BP 142/76 | HR 76 | Ht <= 58 in | Wt 177.0 lb

## 2022-04-13 DIAGNOSIS — E119 Type 2 diabetes mellitus without complications: Secondary | ICD-10-CM

## 2022-04-13 DIAGNOSIS — I10 Essential (primary) hypertension: Secondary | ICD-10-CM | POA: Insufficient documentation

## 2022-04-13 DIAGNOSIS — R42 Dizziness and giddiness: Secondary | ICD-10-CM | POA: Diagnosis not present

## 2022-04-13 DIAGNOSIS — J302 Other seasonal allergic rhinitis: Secondary | ICD-10-CM | POA: Insufficient documentation

## 2022-04-13 DIAGNOSIS — E785 Hyperlipidemia, unspecified: Secondary | ICD-10-CM

## 2022-04-13 DIAGNOSIS — M542 Cervicalgia: Secondary | ICD-10-CM | POA: Diagnosis not present

## 2022-04-13 DIAGNOSIS — Z9109 Other allergy status, other than to drugs and biological substances: Secondary | ICD-10-CM | POA: Diagnosis not present

## 2022-04-13 MED ORDER — FLUTICASONE PROPIONATE 50 MCG/ACT NA SUSP: 2.0000 | Freq: Every day | NASAL | 6 refills | Status: DC

## 2022-04-13 NOTE — Patient Instructions (Addendum)
Please use Flonase  Place 2 sprays into both nostrils daily for your allergies  ?Please continue antivert '25mg'$  three times daily as needed for your vertigo.  ? ?Please use tylenol '650mg'$  three times daily  as needed for your neck pain  ? ?It is important that you exercise regularly at least 30 minutes 5 times a week.  ?Think about what you will eat, plan ahead. ?Choose " clean, green, fresh or frozen" over canned, processed or packaged foods which are more sugary, salty and fatty. ?70 to 75% of food eaten should be vegetables and fruit. ?Three meals at set times with snacks allowed between meals, but they must be fruit or vegetables. ?Aim to eat over a 12 hour period , example 7 am to 7 pm, and STOP after  your last meal of the day. ?Drink water,generally about 64 ounces per day, no other drink is as healthy. Fruit juice is best enjoyed in a healthy way, by EATING the fruit. ? ?Thanks for choosing [facility], we consider it a privelige to serve you. ?

## 2022-04-13 NOTE — Progress Notes (Signed)
? ?  Kimberly Best     MRN: [id number]      DOB: 04-21-1933 ? ? ?HPI ?Kimberly Best with past medical history of hypertension, controlled type 2 diabetes,, hyperlipidemia, intermittent vertigo, GERD is here for follow-up of her chronic medical conditions. ? ?Vertigo.  Chronic condition patient states that she has been taking meclizine 25 mg as needed for her vertigo states that medication is helping. ? ?Pt c/o left sided neck pain that started one day, dull ache 7/10, patient denies trauma to her neck states that,she has not taken any medication for her pain, denies nausea, vomitting, recent changes in her vision, numbness, tingling ? ?Pt c/o chronic sneezing stiuffy nose, clear nasal drainage , takes singular 10 mg daily, denies fever chills.  ? ? ? ? ?ROS ?Denies recent fever or chills. ?Denies sinus pressure, nasal congestion, ear pain or sore throat. ?Denies chest congestion, productive cough or wheezing. ?Denies chest pains, palpitations and leg swelling ?Denies abdominal pain, nausea, vomiting,diarrhea or constipation.   ?Denies dysuria, frequency, hesitancy or incontinence. ?Denies joint pain, swelling and limitation in mobility. ?Denies headaches, seizures, numbness, or tingling. ? ? ? ?PE ? ?BP (!) 142/76   Pulse 76   Ht '4\' 8"'$  (1.422 m)   Wt 177 lb (80.3 kg)   SpO2 98%   BMI 39.68 kg/m?  ? ?Patient alert and oriented and in no cardiopulmonary distress. ? ?HEENT: No facial asymmetry, EOMI,     Neck supple, voice tenderness on palpation of left side of the neck.  Bilateral turbinates appears enlarged clear nasal drainage noted,  ? ?Chest: Clear to auscultation bilaterally. ? ?CVS: S1, S2 no murmurs, no S3.Regular rate. ? ?Ext: No edema ? ?MS: Adequate ROM spine, shoulders, hips and knees. ? ?Psych: Good eye contact, normal affect. Memory intact not anxious or depressed appearing. ? ?CNS: CN 2-12 intact, power,  normal throughout.no focal deficits noted. ? ? ?Assessment & Plan ? ?High blood pressure ?BP  Readings from Last 3 Encounters:  ?04/13/22 (!) 142/76  ?02/01/22 126/68  ?01/26/22 137/68  ?BP elevated in the office today but has been well controlled during her recent visits ?Takes amlodipine 2.5 mg daily, patient stated that she has not taken medication today ?Continue amlodipine 2.5 mg daily ?DASH diet encouraged, exercise daily as tolerated. ?CMP today ? ? ?Controlled type 2 diabetes mellitus without complication, without long-term current use of insulin ([place]) ?Lab Results  ?Component Value Date  ? HGBA1C 6.6 (H) 12/01/2021  ?Currently not on medication ?Stated that she was previously on metformin ?On Crestor 5 mg daily for hyperlipidemia ?Not on ACE or ARB, allergic to lisinopril ?Up-to-date with foot exam and eye exam ?Check A1c today, urine microalbumin labs ?Avoid sugar sweets soda ? ?Hyperlipidemia LDL goal <100 ?LDL goal is less than 70 ?On Crestor 5 mg daily ?Lipid panel today ?Avoid fried fatty foods ? ?Intermittent vertigo ?Continue meclizine 25 mg 3 times daily as needed ?Follow-up with ENT as needed ? ?Environmental allergies ?Chronic uncontrolled condition ?Currently on singular 10 mg daily ?Rx Flonase nasal spray 2 spray into each nostril daily ?Avoid allergens ? ?Neck pain, acute ?Left-sided neck pain started yesterday ?Take Tylenol 650 mg every 6 hours as needed.  ?

## 2022-04-13 NOTE — Assessment & Plan Note (Addendum)
Lab Results  ?Component Value Date  ? HGBA1C 6.6 (H) 12/01/2021  ?Currently not on medication ?Stated that she was previously on metformin ?On Crestor 5 mg daily for hyperlipidemia ?Not on ACE or ARB, allergic to lisinopril ?Up-to-date with foot exam and eye exam ?Check A1c today, urine microalbumin labs ?Avoid sugar sweets soda ?

## 2022-04-13 NOTE — Assessment & Plan Note (Signed)
LDL goal is less than 70 ?On Crestor 5 mg daily ?Lipid panel today ?Avoid fried fatty foods ?

## 2022-04-13 NOTE — Assessment & Plan Note (Signed)
Left-sided neck pain started yesterday ?Take Tylenol 650 mg every 6 hours as needed. ?

## 2022-04-13 NOTE — Assessment & Plan Note (Signed)
Continue meclizine 25 mg 3 times daily as needed ?Follow-up with ENT as needed ?

## 2022-04-13 NOTE — Assessment & Plan Note (Addendum)
BP Readings from Last 3 Encounters:  ?04/13/22 (!) 142/76  ?02/01/22 126/68  ?01/26/22 137/68  ?BP elevated in the office today but has been well controlled during her recent visits ?Takes amlodipine 2.5 mg daily, patient stated that she has not taken medication today ?Continue amlodipine 2.5 mg daily ?DASH diet encouraged, exercise daily as tolerated. ?CMP today ? ?

## 2022-04-13 NOTE — Assessment & Plan Note (Addendum)
Chronic uncontrolled condition ?Currently on singular 10 mg daily ?Rx Flonase nasal spray 2 spray into each nostril daily ?Avoid allergens ?

## 2022-04-14 ENCOUNTER — Other Ambulatory Visit: Payer: Self-pay | Admitting: Nurse Practitioner

## 2022-04-14 MED ORDER — METFORMIN HCL 500 MG PO TABS: 500.0000 mg | ORAL_TABLET | Freq: Every day | ORAL | 3 refills | Status: DC

## 2022-04-14 NOTE — Progress Notes (Signed)
A1C is getting worse, patient should start taking metformin '500mg'$  daily , avoid sugar sweets soda, follow up as planned  ? ?Other labs are normal , continue current meds.

## 2022-04-15 LAB — CMP14+EGFR
ALT: 9 IU/L (ref 0–32)
AST: 19 IU/L (ref 0–40)
Albumin/Globulin Ratio: 1.6 (ref 1.2–2.2)
Albumin: 4.6 g/dL (ref 3.6–4.6)
Alkaline Phosphatase: 72 IU/L (ref 44–121)
BUN/Creatinine Ratio: 18 (ref 12–28)
BUN: 12 mg/dL (ref 8–27)
Bilirubin Total: 0.5 mg/dL (ref 0.0–1.2)
CO2: 28 mmol/L (ref 20–29)
Calcium: 9.7 mg/dL (ref 8.7–10.3)
Chloride: 100 mmol/L (ref 96–106)
Creatinine, Ser: 0.66 mg/dL (ref 0.57–1.00)
Globulin, Total: 2.8 g/dL (ref 1.5–4.5)
Glucose: 96 mg/dL (ref 70–99)
Potassium: 4 mmol/L (ref 3.5–5.2)
Sodium: 141 mmol/L (ref 134–144)
Total Protein: 7.4 g/dL (ref 6.0–8.5)
eGFR: 85 mL/min/{1.73_m2} (ref >59)

## 2022-04-15 LAB — LIPID PANEL
Chol/HDL Ratio: 2.6 ratio (ref 0.0–4.4)
Cholesterol, Total: 134 mg/dL (ref 100–199)
HDL: 52 mg/dL (ref >39)
LDL Chol Calc (NIH): 65 mg/dL (ref 0–99)
Triglycerides: 87 mg/dL (ref 0–149)
VLDL Cholesterol Cal: 17 mg/dL (ref 5–40)

## 2022-04-15 LAB — HEMOGLOBIN A1C
Est. average glucose Bld gHb Est-mCnc: 151 mg/dL
Hgb A1c MFr Bld: 6.9 % — ABNORMAL HIGH (ref 4.8–5.6)

## 2022-04-15 LAB — MICROALBUMIN / CREATININE URINE RATIO
Creatinine, Urine: 92.4 mg/dL
Microalb/Creat Ratio: 79 mg/g creat — ABNORMAL HIGH (ref 0–29)
Microalbumin, Urine: 72.9 ug/mL

## 2022-04-15 LAB — CBC
Hematocrit: 34.6 % (ref 34.0–46.6)
Hemoglobin: 11.3 g/dL (ref 11.1–15.9)
MCH: 29 pg (ref 26.6–33.0)
MCHC: 32.7 g/dL (ref 31.5–35.7)
MCV: 89 fL (ref 79–97)
Platelets: 265 10*3/uL (ref 150–450)
RBC: 3.9 x10E6/uL (ref 3.77–5.28)
RDW: 13.6 % (ref 11.7–15.4)
WBC: 5.2 10*3/uL (ref 3.4–10.8)

## 2022-04-15 NOTE — Progress Notes (Signed)
?  Please call the patient ,I  would like to start her on jardiance '10mg'$  daily instead of metformin that was previously ordered. Vania Rea will help with kidney and heart protection as she has proteinuria. We might needed to do a PA for this. Let her know that I will send in the new medication after you have discussed this with her.

## 2022-04-18 ENCOUNTER — Other Ambulatory Visit: Payer: Self-pay | Admitting: Nurse Practitioner

## 2022-04-18 DIAGNOSIS — E119 Type 2 diabetes mellitus without complications: Secondary | ICD-10-CM

## 2022-04-18 MED ORDER — EMPAGLIFLOZIN 10 MG PO TABS: 10.0000 mg | ORAL_TABLET | Freq: Every day | ORAL | 1 refills | Status: DC

## 2022-05-25 ENCOUNTER — Encounter: Payer: Self-pay | Admitting: Obstetrics & Gynecology

## 2022-05-25 ENCOUNTER — Ambulatory Visit: Payer: Medicare Other | Admitting: Obstetrics & Gynecology

## 2022-05-25 VITALS — BP 160/95 | HR 84 | Ht <= 58 in

## 2022-05-25 DIAGNOSIS — Z4689 Encounter for fitting and adjustment of other specified devices: Secondary | ICD-10-CM | POA: Diagnosis not present

## 2022-05-25 DIAGNOSIS — N813 Complete uterovaginal prolapse: Secondary | ICD-10-CM | POA: Diagnosis not present

## 2022-05-25 NOTE — Progress Notes (Signed)
Chief Complaint  Patient presents with   Pessary Check    Blood pressure (!) 160/95, pulse 84, height '4\' 8"'$  (1.422 m).  Kimberly Best presents today for routine follow up related to her pessary.   She uses a Milex ring with support #5 She reports no vaginal discharge and little vaginal bleeding   Likert scale(1 not bothersome -5 very bothersome)  :  1  Exam reveals no undue vaginal mucosal pressure of breakdown, no discharge and no vaginal bleeding.  Vaginal Epithelial Abnormality Classification System:   0 0    No abnormalities 1    Epithelial erythema 2    Granulation tissue 3    Epithelial break or erosion, 1 cm or less 4    Epithelial break or erosion, 1 cm or greater  The pessary is removed, cleaned and replaced without difficulty.      ICD-10-CM   1. Pessary maintenance, refit 10/18/20 with Milex ring with support #5  Z46.89     2. Uterovaginal prolapse, complete  N81.3        [name] will be sen back in 4 months for continued follow up.  Florian Buff, MD  05/25/2022 11:13 AM

## 2022-06-01 ENCOUNTER — Ambulatory Visit (INDEPENDENT_AMBULATORY_CARE_PROVIDER_SITE_OTHER): Payer: Medicare Other | Admitting: Nurse Practitioner

## 2022-06-01 ENCOUNTER — Encounter: Payer: Self-pay | Admitting: Nurse Practitioner

## 2022-06-01 VITALS — BP 138/65 | HR 74 | Ht <= 58 in | Wt 171.0 lb

## 2022-06-01 DIAGNOSIS — R42 Dizziness and giddiness: Secondary | ICD-10-CM

## 2022-06-01 DIAGNOSIS — I10 Essential (primary) hypertension: Secondary | ICD-10-CM | POA: Diagnosis not present

## 2022-06-01 DIAGNOSIS — E119 Type 2 diabetes mellitus without complications: Secondary | ICD-10-CM

## 2022-06-01 DIAGNOSIS — E118 Type 2 diabetes mellitus with unspecified complications: Secondary | ICD-10-CM | POA: Diagnosis not present

## 2022-06-01 DIAGNOSIS — K219 Gastro-esophageal reflux disease without esophagitis: Secondary | ICD-10-CM

## 2022-06-01 MED ORDER — ROSUVASTATIN CALCIUM 5 MG PO TABS: ORAL_TABLET | ORAL | 1 refills | Status: DC

## 2022-06-01 MED ORDER — EMPAGLIFLOZIN 10 MG PO TABS: 10.0000 mg | ORAL_TABLET | Freq: Every day | ORAL | 0 refills | Status: DC

## 2022-06-01 MED ORDER — MECLIZINE HCL 25 MG PO TABS: 25.0000 mg | ORAL_TABLET | Freq: Three times a day (TID) | ORAL | 2 refills | Status: DC | PRN

## 2022-06-01 MED ORDER — PANTOPRAZOLE SODIUM 40 MG PO TBEC: DELAYED_RELEASE_TABLET | ORAL | 0 refills | Status: DC

## 2022-06-01 NOTE — Patient Instructions (Addendum)
  Please get your shingles vaccine at your pharmacy   It is important that you exercise regularly at least 30 minutes 5 times a week.  Think about what you will eat, plan ahead. Choose " clean, green, fresh or frozen" over canned, processed or packaged foods which are more sugary, salty and fatty. 70 to 75% of food eaten should be vegetables and fruit. Three meals at set times with snacks allowed between meals, but they must be fruit or vegetables. Aim to eat over a 12 hour period , example 7 am to 7 pm, and STOP after  your last meal of the day. Drink water,generally about 64 ounces per day, no other drink is as healthy. Fruit juice is best enjoyed in a healthy way, by EATING the fruit.  Thanks for choosing [facility], we consider it a privelige to serve you.

## 2022-06-01 NOTE — Assessment & Plan Note (Signed)
Chronic condition well-controlled on Protonix 40 mg daily Medication refill

## 2022-06-01 NOTE — Assessment & Plan Note (Signed)
BP Readings from Last 3 Encounters:  06/01/22 138/65  05/25/22 (!) 160/95  04/13/22 (!) 142/76  Currently on amlodipine 2.5 mg daily Continue current medication DASH diet advised engage in regular walking exercises at least for 50 minutes weekly as tolerated

## 2022-06-01 NOTE — Progress Notes (Signed)
   Kimberly Best     MRN: [id number]      DOB: 1933-05-13   HPI Ms. Maker with past medical history of hypertension, GERD, type 2 diabetes, intermittent vertigo is here for follow up for diabetes.  Currently taking Jardiance 10 mg daily patient denies nausea of any other adverse reactions to Jardiance.  Patient stated that Jardiance cost about $40 for 30 day  supply, would like to know if she can get medication assistance with cost of medication.    ROS Denies recent fever or chills. Denies sinus pressure, nasal congestion, ear pain or sore throat. Denies chest congestion, productive cough or wheezing. Denies chest pains, palpitations and leg swelling Denies abdominal pain, nausea, vomiting,diarrhea or constipation.   Denies dysuria, frequency, hesitancy or incontinence. Denies headaches, seizures, numbness, or tingling. Denies depression, anxiety or insomnia.   PE  BP 138/65   Pulse 74   Ht '4\' 8"'$  (1.422 m)   Wt 171 lb (77.6 kg)   SpO2 97%   BMI 38.34 kg/m   Patient alert and oriented and in no cardiopulmonary distress.  Chest: Clear to auscultation bilaterally.  CVS: S1, S2 no murmurs, no S3.Regular rate.  ABD: Soft non tender.   Ext: No edema  MS: Adequate ROM spine, shoulders, hips and knees, using a cane  Psych: Good eye contact, normal affect. Memory intact not anxious or depressed appearing.  CNS: CN 2-12 intact, power,  normal throughout.no focal deficits noted.   Assessment & Plan  High blood pressure BP Readings from Last 3 Encounters:  06/01/22 138/65  05/25/22 (!) 160/95  04/13/22 (!) 142/76  Currently on amlodipine 2.5 mg daily Continue current medication DASH diet advised engage in regular walking exercises at least for 50 minutes weekly as tolerated  Gastroesophageal reflux disease Chronic condition well-controlled on Protonix 40 mg daily Medication refill   Controlled diabetes mellitus type 2 with complications (HCC) Lab Results  Component  Value Date   HGBA1C 6.9 (H) 04/13/2022  She is doing well on Jardiance 10 mg daily Continue Jardiance 10 mg daily Referrals sent to the pharmacist for assistance with cost of medication Avoid sugar sweets soda Follow-up in 3 months  Intermittent vertigo Chronic condition well-controlled on meclizine Meclizine 25 mg 3 times daily as needed refilled

## 2022-06-01 NOTE — Assessment & Plan Note (Signed)
Lab Results  Component Value Date   HGBA1C 6.9 (H) 04/13/2022  She is doing well on Jardiance 10 mg daily Continue Jardiance 10 mg daily Referrals sent to the pharmacist for assistance with cost of medication Avoid sugar sweets soda Follow-up in 3 months

## 2022-06-01 NOTE — Assessment & Plan Note (Signed)
Chronic condition well-controlled on meclizine Meclizine 25 mg 3 times daily as needed refilled

## 2022-06-15 ENCOUNTER — Other Ambulatory Visit: Payer: Self-pay | Admitting: Family Medicine

## 2022-06-15 ENCOUNTER — Encounter (INDEPENDENT_AMBULATORY_CARE_PROVIDER_SITE_OTHER): Payer: Medicare Other | Admitting: Nurse Practitioner

## 2022-06-19 ENCOUNTER — Other Ambulatory Visit: Payer: Self-pay | Admitting: Nurse Practitioner

## 2022-06-19 ENCOUNTER — Other Ambulatory Visit: Payer: Self-pay | Admitting: Family Medicine

## 2022-06-22 ENCOUNTER — Telehealth: Payer: Self-pay | Admitting: Pharmacist

## 2022-06-22 NOTE — Progress Notes (Signed)
[facility])  [place] Team    06/22/2022  KATHARINE ROCHEFORT 05/10/1933 [id number]  Reason for referral: Medication Assistance with Jardiance  Referral source:  Vena Rua, FNP Current insurance:  [employer]   Outreach:  Successful telephone call with Dava Najjar.  HIPAA identifiers verified.    Objective: The ASCVD Risk score (Arnett DK, et al., 2019) failed to calculate for the following reasons:   The 2019 ASCVD risk score is only valid for ages 49 to 20  Lab Results  Component Value Date   CREATININE 0.66 04/13/2022   CREATININE 0.75 12/01/2021   CREATININE 0.94 07/18/2021    Lab Results  Component Value Date   HGBA1C 6.9 (H) 04/13/2022    Lipid Panel     Component Value Date/Time   CHOL 134 04/13/2022 1259   TRIG 87 04/13/2022 1259   HDL 52 04/13/2022 1259   CHOLHDL 2.6 04/13/2022 1259   CHOLHDL 4.0 06/09/2021 0823   VLDL 23 01/07/2019 1522   LDLCALC 65 04/13/2022 1259   LDLCALC 118 (H) 06/09/2021 0823    BP Readings from Last 3 Encounters:  06/01/22 138/65  05/25/22 (!) 160/95  04/13/22 (!) 142/76    Allergies  Allergen Reactions   Lisinopril Other (See Comments)    Tongue swelling   Penicillins Swelling    Has patient had a PCN reaction causing immediate rash, facial/tongue/throat swelling, SOB or lightheadedness with hypotension: Yes Has patient had a PCN reaction causing severe rash involving mucus membranes or skin necrosis: No Has patient had a PCN reaction that required hospitalization: No Has patient had a PCN reaction occurring within the last 10 years: No If all of the above answers are "NO", then may proceed with Cephalosporin use.   Assessment: Spoke with patient today, she reports that she recently changed her insurance plan from [facility] to [place]. She reports that she just received a 90 day supply of the Jardiance for a cost of $0, however is unsure what the cost will be on her next refill.   Discussed additional barriers that patient may be facing, patient declines a referral to the CM team at this time. Patient reports that she doesn't need much, she is on a program for her utilities and receives food from a local church, [place].     Medication Assistance Findings:  No medication assistance needs identified  Extra Help:  Already receiving Partial Extra Help Low Income Subsidy   Additional medication assistance options reviewed with patient as warranted:  Insurance OTC catalogue  Plan: Will close [facility] pharmacy case as no further medication needs identified at this time.  Am happy to assist in the future as needed.

## 2022-06-27 ENCOUNTER — Other Ambulatory Visit: Payer: Self-pay | Admitting: Family Medicine

## 2022-06-29 ENCOUNTER — Encounter: Payer: Self-pay | Admitting: Internal Medicine

## 2022-07-22 IMAGING — DX DG RIBS BILAT 3V
4 series · 4 of 4 positions shown · non-contrast
Comparison: None.

CLINICAL DATA: Chest wall pain.  Right-sided rib pain.

EXAM:
BILATERAL RIBS - 3+ VIEW

[rib pa obl (1 of 2)]
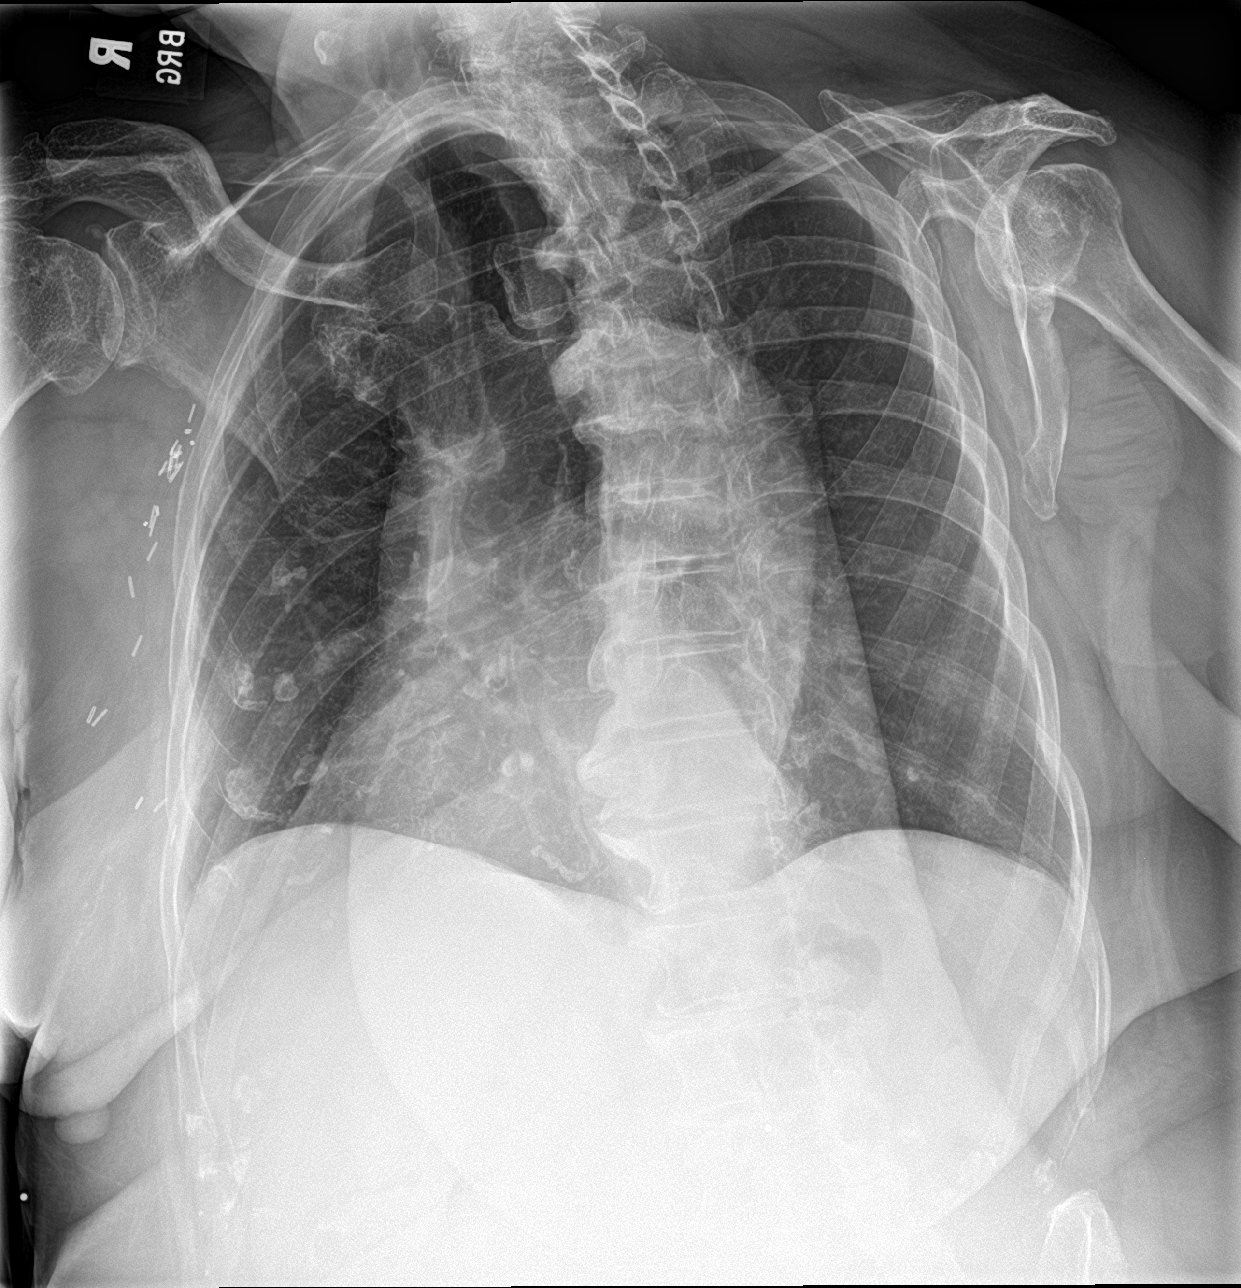

[rib pa obl (2 of 2)]
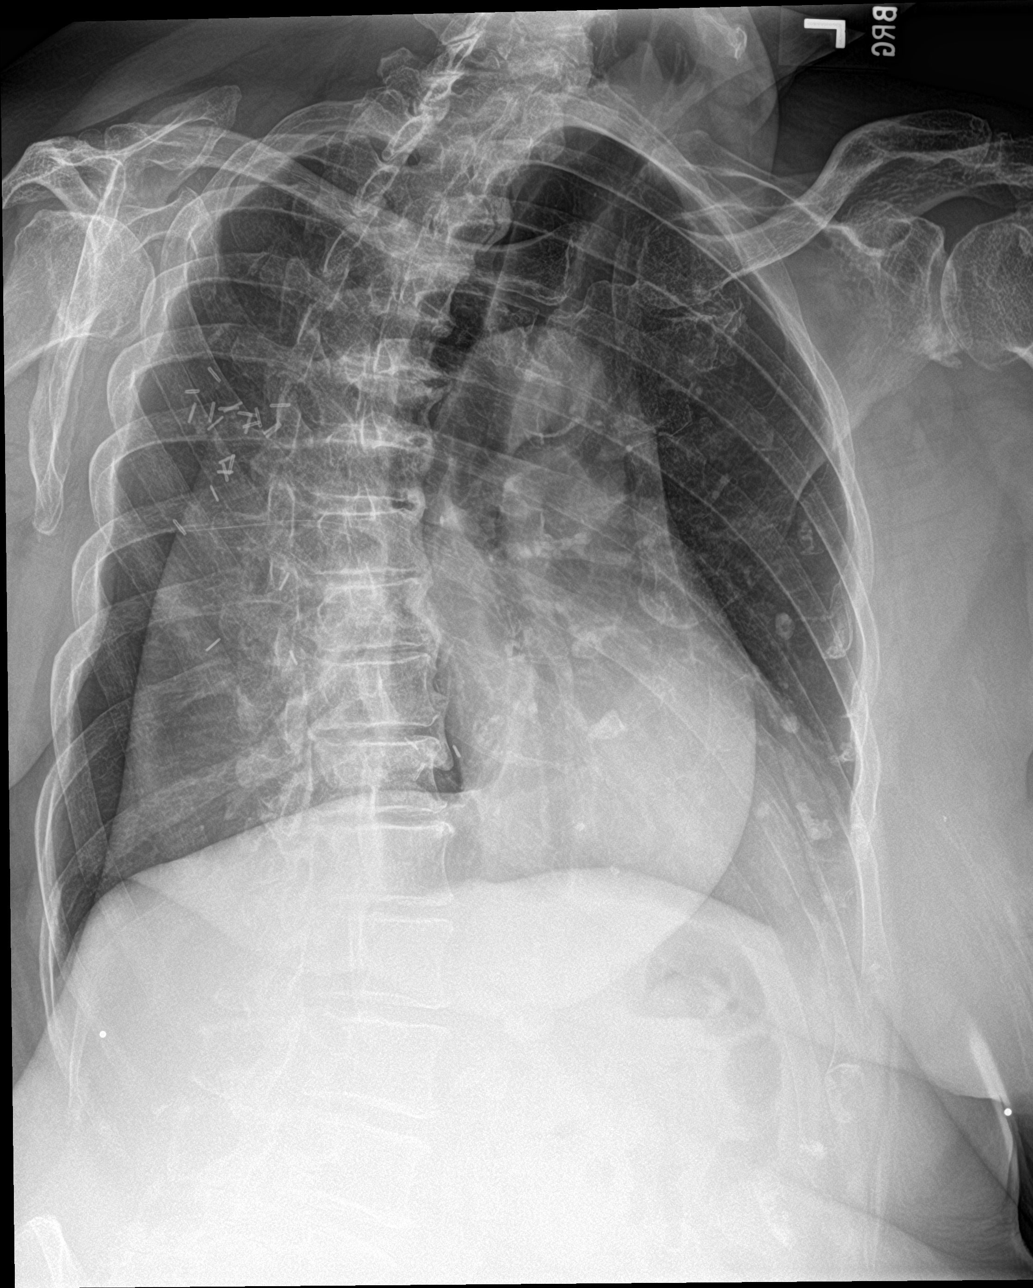

[rib pa (1 of 2)]
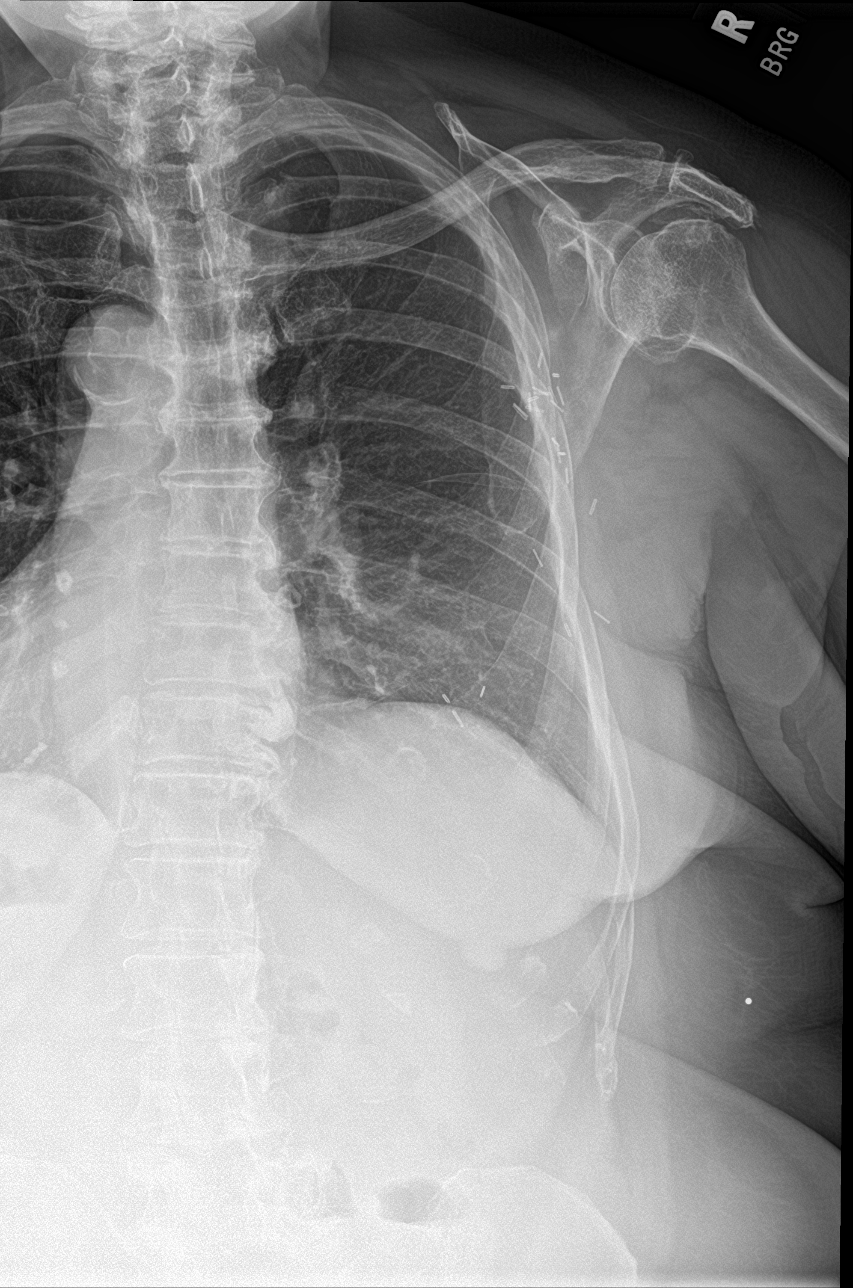

[rib pa (2 of 2)]
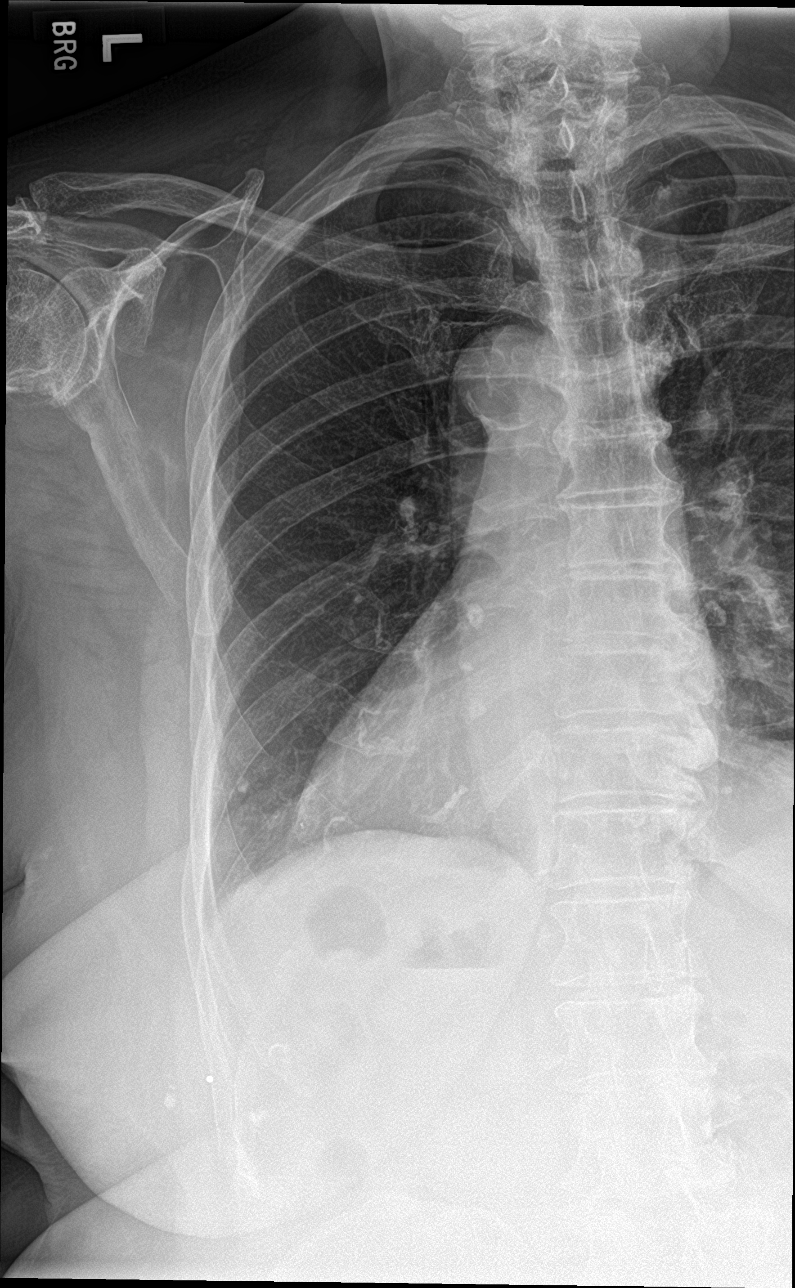

[4 of 4 positions shown; findings below may reference images not displayed]

FINDINGS: No fracture or other bone lesions are seen involving the ribs. A BB
was placed at site of pain overlying the right ribs. No radiographic
abnormality subjacent to the BB. A calcified granuloma is
incidentally noted in the left lung base. Multiple right axillary
surgical clips.
IMPRESSION: Negative radiographs of the ribs.  No explanation for rib pain.

## 2022-08-02 DIAGNOSIS — Z6836 Body mass index (BMI) 36.0-36.9, adult: Secondary | ICD-10-CM | POA: Diagnosis not present

## 2022-08-02 DIAGNOSIS — R32 Unspecified urinary incontinence: Secondary | ICD-10-CM | POA: Diagnosis not present

## 2022-08-02 DIAGNOSIS — I10 Essential (primary) hypertension: Secondary | ICD-10-CM | POA: Diagnosis not present

## 2022-08-02 DIAGNOSIS — Z7984 Long term (current) use of oral hypoglycemic drugs: Secondary | ICD-10-CM | POA: Diagnosis not present

## 2022-08-02 DIAGNOSIS — R42 Dizziness and giddiness: Secondary | ICD-10-CM | POA: Diagnosis not present

## 2022-08-02 DIAGNOSIS — K219 Gastro-esophageal reflux disease without esophagitis: Secondary | ICD-10-CM | POA: Diagnosis not present

## 2022-08-02 DIAGNOSIS — E119 Type 2 diabetes mellitus without complications: Secondary | ICD-10-CM | POA: Diagnosis not present

## 2022-08-02 DIAGNOSIS — E785 Hyperlipidemia, unspecified: Secondary | ICD-10-CM | POA: Diagnosis not present

## 2022-08-02 DIAGNOSIS — Z8249 Family history of ischemic heart disease and other diseases of the circulatory system: Secondary | ICD-10-CM | POA: Diagnosis not present

## 2022-08-02 DIAGNOSIS — Z818 Family history of other mental and behavioral disorders: Secondary | ICD-10-CM | POA: Diagnosis not present

## 2022-08-02 DIAGNOSIS — M1990 Unspecified osteoarthritis, unspecified site: Secondary | ICD-10-CM | POA: Diagnosis not present

## 2022-08-11 ENCOUNTER — Other Ambulatory Visit: Payer: Self-pay | Admitting: Family Medicine

## 2022-08-15 ENCOUNTER — Ambulatory Visit (INDEPENDENT_AMBULATORY_CARE_PROVIDER_SITE_OTHER): Payer: Medicare HMO | Admitting: Internal Medicine

## 2022-08-15 ENCOUNTER — Encounter: Payer: Self-pay | Admitting: Internal Medicine

## 2022-08-15 VITALS — BP 144/86 | HR 80 | Temp 97.7°F | Ht <= 58 in | Wt 171.0 lb

## 2022-08-15 DIAGNOSIS — K219 Gastro-esophageal reflux disease without esophagitis: Secondary | ICD-10-CM

## 2022-08-15 DIAGNOSIS — D509 Iron deficiency anemia, unspecified: Secondary | ICD-10-CM | POA: Diagnosis not present

## 2022-08-15 NOTE — Progress Notes (Signed)
Primary Care Physician:  Renee Rival, FNP Primary Gastroenterologist:  Dr. Gala Romney  Pre-Procedure History & Physical: HPI:  Kimberly Best is a 88 y.o. female here for follow-up of iron deficiency anemia.  H. pylori eradication proven.  Aspirin withdrawn from her regimen.  GI evaluation complete with EGD, colonoscopy and capsule study.  Small ulcer seen in the small bowel felt to be secondary to aspirin previously.  Reflux well controlled on pantoprazole 40 mg daily.  Most recent CBC from May 4 demonstrated an H&H of 11.3/34.6/MCV 89  Past Medical History:  Diagnosis Date   Allergy    Anxiety    Arthritis    Cancer ([place])    right breast cancer   Diabetes mellitus without complication (HCC)    GERD (gastroesophageal reflux disease)    H. pylori infection 05/2020   S/p treatment with Pylera.  H. pylori stool antigen negative on 12/21/2020.   HLD (hyperlipidemia)    Hypertension    IDA (iron deficiency anemia)    Hg in 9-10 range since September 2019.   Pessary maintenance 03/10/2015    Past Surgical History:  Procedure Laterality Date   BIOPSY  05/26/2020   Procedure: BIOPSY;  Surgeon: Daneil Dolin, MD;  Location: [facility];  Service: Endoscopy;;   BREAST SURGERY Right    lumpectomy    CHOLECYSTECTOMY     COLONOSCOPY N/A 05/26/2020   Procedure: COLONOSCOPY;  Surgeon: Daneil Dolin, MD; Diverticulosis in sigmoid colon, friable colonic mucosa likely secondary to ASA/NSAID insult, exam was otherwise normal.     ESOPHAGOGASTRODUODENOSCOPY N/A 05/26/2020   Procedure: ESOPHAGOGASTRODUODENOSCOPY (EGD);  Surgeon: Daneil Dolin, MD;  Normal esophagus s/p dilation, mucosal changes in the stomach biopsied, normal examined duodenum.  Pathology revealed H. pylori.   EYE SURGERY     GIVENS CAPSULE STUDY N/A 02/16/2021   Procedure: GIVENS CAPSULE STUDY;  Surgeon: Daneil Dolin, MD;  gastric and duodenal bulbar erosions, 2 nonbleeding small bowel ulcers with benign  appearance suspected to be secondary to chronic aspirin use.    Prior to Admission medications   Medication Sig Start Date End Date Taking? Authorizing Provider  ACCU-CHEK AVIVA PLUS test strip USE AS DIRECTED TO TEST TWICE DAILY. 06/19/22  Yes [name], [name], FNP  Accu-Chek Softclix Lancets lancets USE AS DIRECTED TO TEST TWICE DAILY. 06/19/22  Yes [name], [name], FNP  acetaminophen (TYLENOL) 650 MG CR tablet Take 650 mg by mouth 2 (two) times daily as needed for pain.   Yes [provider]  amLODipine (NORVASC) 2.5 MG tablet TAKE ONE TABLET (2.'5MG'$  TOTAL) BY MOUTH DAILY 08/11/22  Yes [name], [name], FNP  cholecalciferol (VITAMIN D) 1000 UNITS tablet Take 1,000 Units by mouth daily.   Yes [provider]  clotrimazole-betamethasone (LOTRISONE) cream For up to 14 days and then stop the medication. 08/03/21  Yes [name], NP  empagliflozin (JARDIANCE) 10 MG TABS tablet Take 1 tablet (10 mg total) by mouth daily before breakfast. 06/01/22  Yes [name], [name], FNP  fluticasone (FLONASE) 50 MCG/ACT nasal spray Place 2 sprays into both nostrils daily. 04/13/22  Yes [name], [name], FNP  meclizine (ANTIVERT) 25 MG tablet Take 1 tablet (25 mg total) by mouth 3 (three) times daily as needed for dizziness. 06/01/22  Yes [name], [name], FNP  montelukast (SINGULAIR) 10 MG tablet TAKE ONE TABLET ('10MG'$  TOTAL) BY MOUTH ATBEDTIME 06/27/22  Yes Fayrene Helper, MD  Multiple Vitamins-Minerals (IMMUNE SUPPORT PO) Take  1 tablet by mouth daily.   Yes [provider]  pantoprazole (PROTONIX) 40 MG tablet Take one tablet ('40mg'$  )daily 06/01/22  Yes [name], [name], FNP  rosuvastatin (CRESTOR) 5 MG tablet TAKE ONE (1) TABLET BY MOUTH EVERY DAY 06/01/22  Yes [name], [name], FNP  SLOW FE 142 (45 Fe) MG TBCR Take 2 tablets by mouth every morning. Patient taking differently: Take 1 tablet by mouth every morning. 03/29/21  Yes [name], NP    Allergies as of  08/15/2022 - Review Complete 08/15/2022  Allergen Reaction Noted   Lisinopril Other (See Comments) 12/01/2021   Penicillins Swelling 11/18/2014    Family History  Problem Relation Age of Onset   Cancer Father    Stroke Mother    Hypertension Mother    Diabetes Sister    Hypertension Sister    Cancer Brother    Hypertension Brother    Bipolar disorder Daughter    Hypertension Maternal Grandmother    Tuberculosis Paternal Grandmother    Hypertension Paternal Grandmother    Alcohol abuse Paternal Grandfather    Hypertension Paternal Grandfather    Colon cancer Neg Hx     Social History   Socioeconomic History   Marital status: Married    Spouse name: Not on file   Number of children: Not on file   Years of education: Not on file   Highest education level: Not on file  Occupational History   Not on file  Tobacco Use   Smoking status: Never   Smokeless tobacco: Never  Vaping Use   Vaping Use: Never used  Substance and Sexual Activity   Alcohol use: Not Currently    Comment: Couple ounces of champagne once every couple of weeks or less.   Drug use: No   Sexual activity: Not Currently    Birth control/protection: Post-menopausal  Other Topics Concern   Not on file  Social History Narrative   Widow since 2015,married for 5 years.Lives alone.Retired.   Social Determinants of Health   Financial Resource Strain: Not on file  Food Insecurity: Not on file  Transportation Needs: Not on file  Physical Activity: Not on file  Stress: Not on file  Social Connections: Not on file  Intimate Partner Violence: Not on file    Review of Systems: See HPI, otherwise negative ROS  Physical Exam: BP (!) 144/86 (BP Location: Right Arm, Patient Position: Sitting, Cuff Size: Large)   Pulse 80   Temp 97.7 F (36.5 C) (Temporal)   Ht '4\' 8"'$  (1.422 m)   Wt 171 lb (77.6 kg)   SpO2 99%   BMI 38.34 kg/m  General:   Alert,  Well-developed, well-nourished, pleasant and cooperative  in NAD Abdomen: Non-distended, normal bowel sounds.  Soft and nontender without appreciable mass or hepatosplenomegaly.  Pulses:  Normal pulses noted. Extremities:  Without clubbing or edema.  Impression/Plan: Pleasant 88 year old lady with a history of iron deficiency anemia and H. pylori/NSAID use.  H. pylori eradicated.  Aspirin removed from her regimen.  Likely suffered NSAID insult to her small bowel.  Also, chronic H. pylori infection can induce an iron deficiency anemia [place]. CBC entirely normal.  Patient is asymptomatic from a GI standpoint.  GERD well controlled.  Recommendations: Your most recent complete blood count was normal.  No further GI evaluation is needed at this time  May continue pantoprazole Protonix 40 mg daily for acid reflux  Avoid aspirin and NSAIDs like Advil and ibuprofen  May use Tylenol  as needed for aches and pains along his label directions or not exceeded  No scheduled follow-up appointment in our office at this time.  However, if a problem comes up, be happy to see you anytime.   Notice: This dictation was prepared with Dragon dictation along with smaller phrase technology. Any transcriptional errors that result from this process are unintentional and may not be corrected upon review.

## 2022-08-15 NOTE — Patient Instructions (Signed)
It was good to see you again today!  Your most recent complete blood count was normal.  No further GI evaluation is needed at this time  May continue pantoprazole Protonix 40 mg daily for acid reflux  Avoid aspirin and NSAIDs like Advil and ibuprofen  May use Tylenol as needed for aches and pains along his label directions or not exceeded  No scheduled follow-up appointment in our office at this time.  However, if a problem comes up, be happy to see you anytime.

## 2022-08-17 ENCOUNTER — Encounter: Payer: Self-pay | Admitting: Nurse Practitioner

## 2022-08-17 ENCOUNTER — Ambulatory Visit (INDEPENDENT_AMBULATORY_CARE_PROVIDER_SITE_OTHER): Payer: Medicare HMO | Admitting: Nurse Practitioner

## 2022-08-17 VITALS — BP 132/78 | HR 69 | Ht <= 58 in | Wt 170.0 lb

## 2022-08-17 DIAGNOSIS — E785 Hyperlipidemia, unspecified: Secondary | ICD-10-CM

## 2022-08-17 DIAGNOSIS — E118 Type 2 diabetes mellitus with unspecified complications: Secondary | ICD-10-CM

## 2022-08-17 DIAGNOSIS — I10 Essential (primary) hypertension: Secondary | ICD-10-CM

## 2022-08-17 NOTE — Assessment & Plan Note (Signed)
Doing well on jardiance '10mg'$  daily  Denies hypoglycemia  Foot exam was normal today  Check A1C , BMP Avoid sugar sweets , soda

## 2022-08-17 NOTE — Patient Instructions (Signed)
Please get your shingles vaccine at the pharmacy.    It is important that you exercise regularly at least 30 minutes 5 times a week as tolerated.  Think about what you will eat, plan ahead. Choose " clean, green, fresh or frozen" over canned, processed or packaged foods which are more sugary, salty and fatty. 70 to 75% of food eaten should be vegetables and fruit. Three meals at set times with snacks allowed between meals, but they must be fruit or vegetables. Aim to eat over a 12 hour period , example 7 am to 7 pm, and STOP after  your last meal of the day. Drink water,generally about 64 ounces per day, no other drink is as healthy. Fruit juice is best enjoyed in a healthy way, by EATING the fruit.  Thanks for choosing [facility], we consider it a privelige to serve you.

## 2022-08-17 NOTE — Assessment & Plan Note (Signed)
BP Readings from Last 3 Encounters:  08/17/22 132/78  08/15/22 (!) 144/86  06/01/22 138/65  condition well controlled on amlodipine 2.'5mg'$  daily  Continue current medication DASH diet advised, encouraged to engage in regular daily walking exercises  follow up in 4 months .

## 2022-08-17 NOTE — Assessment & Plan Note (Signed)
On crestor 5 mg daily  avoid fatty fried foods  Lab Results  Component Value Date   CHOL 134 04/13/2022   HDL 52 04/13/2022   LDLCALC 65 04/13/2022   TRIG 87 04/13/2022   CHOLHDL 2.6 04/13/2022

## 2022-08-17 NOTE — Progress Notes (Signed)
   Kimberly Best     MRN: [id number]      DOB: Feb 15, 1933   HPI [name] with past medical history of HTN, DM, HLD, obesity , osteoporosis is here for follow up and re-evaluation of chronic medical conditions, medication management   HTN. Currently on amlodipine 2.'5mg'$  daily , denies  edema, syncope, chest pain  T2DM . Currently on jardiance '10mg'$  daily denies hypoglycemia, polyphagia, polyuria, polydipsia.   Due for shingle vaccine pt encouraged to get her vaccine ta the pharmacy     ROS Denies recent fever or chills. Denies sinus pressure, nasal congestion, ear pain or sore throat. Denies chest congestion, productive cough or wheezing. Denies chest pains, palpitations and leg swelling Denies abdominal pain, nausea, vomiting,diarrhea or constipation.   Denies dysuria, frequency, hesitancy or incontinence. Denies joint pain, swelling and limitation in mobility. Denies headaches, seizures, numbness, or tingling. Denies depression, anxiety or insomnia.    PE  BP 132/78 (BP Location: Left Arm, Cuff Size: Normal)   Pulse 69   Ht '4\' 8"'$  (1.422 m)   Wt 170 lb (77.1 kg)   SpO2 94%   BMI 38.11 kg/m   Patient alert and oriented and in no cardiopulmonary distress.  Chest: Clear to auscultation bilaterally.  CVS: S1, S2 no murmurs, no S3.Regular rate.  ABD: Soft non tender.   Ext: No edema  MS: Adequate ROM spine, shoulders, hips and knees.  Psych: Good eye contact, normal affect. Memory intact not anxious or depressed appearing.  CNS: CN 2-12 intact, power,  normal throughout.no focal deficits noted.   Assessment & Plan  High blood pressure BP Readings from Last 3 Encounters:  08/17/22 132/78  08/15/22 (!) 144/86  06/01/22 138/65  condition well controlled on amlodipine 2.'5mg'$  daily  Continue current medication DASH diet advised, encouraged to engage in regular daily walking exercises  follow up in 4 months .   Controlled diabetes mellitus type 2 with  complications ([place]) Doing well on jardiance '10mg'$  daily  Denies hypoglycemia  Foot exam was normal today  Check A1C , BMP Avoid sugar sweets , soda   Hyperlipidemia LDL goal <100 On crestor 5 mg daily  avoid fatty fried foods  Lab Results  Component Value Date   CHOL 134 04/13/2022   HDL 52 04/13/2022   LDLCALC 65 04/13/2022   TRIG 87 04/13/2022   CHOLHDL 2.6 04/13/2022

## 2022-08-18 LAB — BASIC METABOLIC PANEL
BUN/Creatinine Ratio: 18 (ref 12–28)
BUN: 13 mg/dL (ref 8–27)
CO2: 25 mmol/L (ref 20–29)
Calcium: 9.9 mg/dL (ref 8.7–10.3)
Chloride: 101 mmol/L (ref 96–106)
Creatinine, Ser: 0.72 mg/dL (ref 0.57–1.00)
Glucose: 77 mg/dL (ref 70–99)
Potassium: 4.6 mmol/L (ref 3.5–5.2)
Sodium: 141 mmol/L (ref 134–144)
eGFR: 81 mL/min/{1.73_m2} (ref >59)

## 2022-08-18 LAB — LIPID PANEL
Chol/HDL Ratio: 2.9 ratio (ref 0.0–4.4)
Cholesterol, Total: 156 mg/dL (ref 100–199)
HDL: 54 mg/dL (ref >39)
LDL Chol Calc (NIH): 81 mg/dL (ref 0–99)
Triglycerides: 117 mg/dL (ref 0–149)
VLDL Cholesterol Cal: 21 mg/dL (ref 5–40)

## 2022-08-18 LAB — HEMOGLOBIN A1C
Est. average glucose Bld gHb Est-mCnc: 143 mg/dL
Hgb A1c MFr Bld: 6.6 % — ABNORMAL HIGH (ref 4.8–5.6)

## 2022-08-18 NOTE — Progress Notes (Signed)
Labs are stable continue current medications

## 2022-08-28 ENCOUNTER — Other Ambulatory Visit: Payer: Self-pay | Admitting: Nurse Practitioner

## 2022-08-28 DIAGNOSIS — K219 Gastro-esophageal reflux disease without esophagitis: Secondary | ICD-10-CM

## 2022-09-05 ENCOUNTER — Ambulatory Visit (INDEPENDENT_AMBULATORY_CARE_PROVIDER_SITE_OTHER): Payer: Medicare HMO

## 2022-09-05 DIAGNOSIS — Z0000 Encounter for general adult medical examination without abnormal findings: Secondary | ICD-10-CM | POA: Diagnosis not present

## 2022-09-05 NOTE — Progress Notes (Signed)
Subjective:   NAZARIAH CADET is a 88 y.o. female who presents for Medicare Annual (Subsequent) preventive examination.  Review of Systems    I connected with  ZITA OZIMEK on 09/05/22 by a audio enabled telemedicine application and verified that I am speaking with the correct person using two identifiers.  Patient Location: Home  Provider Location: Office/Clinic  I discussed the limitations of evaluation and management by telemedicine. The patient expressed understanding and agreed to proceed.        Objective:    There were no vitals filed for this visit. There is no height or weight on file to calculate BMI.     12/20/2021    4:48 PM 07/18/2021    8:49 AM 06/28/2021    9:21 PM 06/15/2021   10:23 AM 06/04/2020    8:12 AM 05/26/2020    7:38 AM 04/01/2020    1:13 PM  Advanced Directives  Does Patient Have a Medical Advance Directive? No No No;Yes No No No No  Type of Advance Directive   Living will      Would patient like information on creating a medical advance directive? No - Patient declined No - Patient declined  Yes (MAU/Ambulatory/Procedural Areas - Information given) No - Patient declined No - Patient declined No - Patient declined    Current Medications (verified) Outpatient Encounter Medications as of 09/05/2022  Medication Sig   ACCU-CHEK AVIVA PLUS test strip USE AS DIRECTED TO TEST TWICE DAILY.   Accu-Chek Softclix Lancets lancets USE AS DIRECTED TO TEST TWICE DAILY.   acetaminophen (TYLENOL) 650 MG CR tablet Take 650 mg by mouth 2 (two) times daily as needed for pain.   amLODipine (NORVASC) 2.5 MG tablet TAKE ONE TABLET (2.'5MG'$  TOTAL) BY MOUTH DAILY   cholecalciferol (VITAMIN D) 1000 UNITS tablet Take 1,000 Units by mouth daily.   clotrimazole-betamethasone (LOTRISONE) cream For up to 14 days and then stop the medication.   empagliflozin (JARDIANCE) 10 MG TABS tablet Take 1 tablet (10 mg total) by mouth daily before breakfast.   fluticasone (FLONASE) 50 MCG/ACT  nasal spray Place 2 sprays into both nostrils daily.   meclizine (ANTIVERT) 25 MG tablet Take 1 tablet (25 mg total) by mouth 3 (three) times daily as needed for dizziness.   montelukast (SINGULAIR) 10 MG tablet TAKE ONE TABLET ('10MG'$  TOTAL) BY MOUTH ATBEDTIME   Multiple Vitamins-Minerals (IMMUNE SUPPORT PO) Take 1 tablet by mouth daily.   pantoprazole (PROTONIX) 40 MG tablet TAKE ONE TABLET ('40MG'$ ) BY MOUTH DAILY   rosuvastatin (CRESTOR) 5 MG tablet TAKE ONE (1) TABLET BY MOUTH EVERY DAY   SLOW FE 142 (45 Fe) MG TBCR Take 2 tablets by mouth every morning. (Patient taking differently: Take 1 tablet by mouth every morning.)   No facility-administered encounter medications on file as of 09/05/2022.    Allergies (verified) Lisinopril and Penicillins   History: Past Medical History:  Diagnosis Date   Allergy    Anxiety    Arthritis    Cancer ([place])    right breast cancer   Diabetes mellitus without complication ([place])    GERD (gastroesophageal reflux disease)    H. pylori infection 05/2020   S/p treatment with Pylera.  H. pylori stool antigen negative on 12/21/2020.   HLD (hyperlipidemia)    Hypertension    IDA (iron deficiency anemia)    Hg in 9-10 range since September 2019.   Pessary maintenance 03/10/2015   Past Surgical History:  Procedure Laterality Date  BIOPSY  05/26/2020   Procedure: BIOPSY;  Surgeon: Daneil Dolin, MD;  Location: [facility];  Service: Endoscopy;;   BREAST SURGERY Right    lumpectomy    CHOLECYSTECTOMY     COLONOSCOPY N/A 05/26/2020   Procedure: COLONOSCOPY;  Surgeon: Daneil Dolin, MD; Diverticulosis in sigmoid colon, friable colonic mucosa likely secondary to ASA/NSAID insult, exam was otherwise normal.     ESOPHAGOGASTRODUODENOSCOPY N/A 05/26/2020   Procedure: ESOPHAGOGASTRODUODENOSCOPY (EGD);  Surgeon: Daneil Dolin, MD;  Normal esophagus s/p dilation, mucosal changes in the stomach biopsied, normal examined duodenum.  Pathology revealed H.  pylori.   EYE SURGERY     GIVENS CAPSULE STUDY N/A 02/16/2021   Procedure: GIVENS CAPSULE STUDY;  Surgeon: Daneil Dolin, MD;  gastric and duodenal bulbar erosions, 2 nonbleeding small bowel ulcers with benign appearance suspected to be secondary to chronic aspirin use.   Family History  Problem Relation Age of Onset   Cancer Father    Stroke Mother    Hypertension Mother    Diabetes Sister    Hypertension Sister    Cancer Brother    Hypertension Brother    Bipolar disorder Daughter    Hypertension Maternal Grandmother    Tuberculosis Paternal Grandmother    Hypertension Paternal Grandmother    Alcohol abuse Paternal Grandfather    Hypertension Paternal Grandfather    Colon cancer Neg Hx    Social History   Socioeconomic History   Marital status: Married    Spouse name: Not on file   Number of children: Not on file   Years of education: Not on file   Highest education level: Not on file  Occupational History   Not on file  Tobacco Use   Smoking status: Never   Smokeless tobacco: Never  Vaping Use   Vaping Use: Never used  Substance and Sexual Activity   Alcohol use: Not Currently    Comment: Couple ounces of champagne once every couple of weeks or less.   Drug use: No   Sexual activity: Not Currently    Birth control/protection: Post-menopausal  Other Topics Concern   Not on file  Social History Narrative   Widow since 2015,married for 15 years.Lives alone.Retired.   Social Determinants of Health   Financial Resource Strain: Not on file  Food Insecurity: Not on file  Transportation Needs: Not on file  Physical Activity: Not on file  Stress: Not on file  Social Connections: Not on file    Tobacco Counseling Counseling given: Not Answered   Clinical Intake:   Diabetic?YES Nutrition Risk Assessment:  Has the patient had any N/V/D within the last 2 months?  No  Does the patient have any non-healing wounds?  No  Has the patient had any unintentional  weight loss or weight gain?  No   Diabetes:  Is the patient diabetic?  Yes  If diabetic, was a CBG obtained today?  No  Did the patient bring in their glucometer from home?  No  How often do you monitor your CBG's? 2 TIMES DAILY.   Financial Strains and Diabetes Management:  Are you having any financial strains with the device, your supplies or your medication? No .  Does the patient want to be seen by Chronic Care Management for management of their diabetes?  No  Would the patient like to be referred to a Nutritionist or for Diabetic Management?  No   Diabetic Exams:  Diabetic Eye Exam: Completed 12/13/21 Diabetic Foot Exam: Completed 08/17/22  Activities of Daily Living     No data to display           Patient Care Team: Alvira Monday, FNP as PCP - General (Family Medicine) Gala Romney, [name], MD as Consulting Physician (Gastroenterology)  Indicate any recent Medical Services you may have received from other than [facility] providers in the past year (date may be approximate).     Assessment:   This is a routine wellness examination for Anneta.  Hearing/Vision screen No results found.  Dietary issues and exercise activities discussed:     Goals Addressed   None   Depression Screen    08/17/2022   11:06 AM 06/01/2022   10:54 AM 04/13/2022   11:41 AM 03/27/2022    8:23 AM 01/26/2022   11:20 AM 12/01/2021    9:28 AM 06/15/2021   10:00 AM  PHQ 2/9 Scores  PHQ - 2 Score 0 1 0 0 0 0 0  PHQ- 9 Score       0    Fall Risk    08/17/2022   11:06 AM 06/01/2022   10:54 AM 04/13/2022   11:41 AM 03/27/2022    8:23 AM 01/26/2022   11:20 AM  Fall Risk   Falls in the past year? 0 0 0 0 0  Number falls in past yr: 0 0 0 0 0  Injury with Fall? 0 0 0 0 0  Risk for fall due to : No Fall Risks No Fall Risks No Fall Risks No Fall Risks No Fall Risks  Follow up Falls evaluation completed Falls evaluation completed Falls evaluation completed Falls evaluation completed Falls  evaluation completed    FALL RISK PREVENTION PERTAINING TO THE HOME:  Any stairs in or around the home? Yes  If so, are there any without handrails? Yes  Home free of loose throw rugs in walkways, pet beds, electrical cords, etc? Yes  Adequate lighting in your home to reduce risk of falls? Yes   ASSISTIVE DEVICES UTILIZED TO PREVENT FALLS:  Life alert? No  Use of a cane, walker or w/c? Yes  Grab bars in the bathroom? Yes  Shower chair or bench in shower? Yes  Elevated toilet seat or a handicapped toilet? Yes           06/15/2021   10:27 AM 06/04/2020    8:19 AM  6CIT Screen  What Year? 0 points 0 points  What month? 0 points 0 points  What time? 0 points 0 points  Count back from 20 2 points 2 points  Months in reverse 0 points 0 points  Repeat phrase 2 points 8 points  Total Score 4 points 10 points    Immunizations Immunization History  Administered Date(s) Administered   Influenza, High Dose Seasonal PF 09/21/2020   Influenza-Unspecified 09/27/2016, 09/30/2018, 08/21/2019   Pneumococcal Conjugate-13 08/18/2014   Pneumococcal Polysaccharide-23 08/24/2015   Tdap 06/16/2020    TDAP status: Up to date  Flu Vaccine status: Due, Education has been provided regarding the importance of this vaccine. Advised may receive this vaccine at local pharmacy or Health Dept. Aware to provide a copy of the vaccination record if obtained from local pharmacy or Health Dept. Verbalized acceptance and understanding.  Pneumococcal vaccine status: Up to date  Covid-19 vaccine status: Information provided on how to obtain vaccines.   Qualifies for Shingles Vaccine? Yes   Zostavax completed No   Shingrix Completed?: No.    Education has been provided regarding the importance of this vaccine.  Patient has been advised to call insurance company to determine out of pocket expense if they have not yet received this vaccine. Advised may also receive vaccine at local pharmacy or Health Dept.  Verbalized acceptance and understanding.  Screening Tests Health Maintenance  Topic Date Due   Zoster Vaccines- Shingrix (1 of 2) Never done   INFLUENZA VACCINE  09/09/2022 (Originally 07/11/2022)   COVID-19 Vaccine (1) 12/07/2022 (Originally 11/09/1933)   OPHTHALMOLOGY EXAM  12/13/2022   HEMOGLOBIN A1C  02/15/2023   FOOT EXAM  08/18/2023   TETANUS/TDAP  06/16/2030   Pneumonia Vaccine 83+ Years old  Completed   DEXA SCAN  Completed   HPV VACCINES  Aged Out    Health Maintenance  Health Maintenance Due  Topic Date Due   Zoster Vaccines- Shingrix (1 of 2) Never done    Colorectal cancer screening: No longer required.   Mammogram status: No longer required due to AGE.  Bone Density status: Completed 09/17/2020. Results reflect: Bone density results: OSTEOPOROSIS. Repeat every 2-3 years.  Lung Cancer Screening: (Low Dose CT Chest recommended if Age 17-80 years, 30 pack-year currently smoking OR have quit w/in 15years.) does not qualify.   Lung Cancer Screening Referral: NO  Additional Screening:  Hepatitis C Screening: does qualify; Completed   Vision Screening: Recommended annual ophthalmology exams for early detection of glaucoma and other disorders of the eye. Is the patient up to date with their annual eye exam?  Yes  Who is the provider or what is the name of the office in which the patient attends annual eye exams? Dr Katy Fitch If pt is not established with a provider, would they like to be referred to a provider to establish care? No .   Dental Screening: Recommended annual dental exams for proper oral hygiene  Community Resource Referral / Chronic Care Management: CRR required this visit?  No   CCM required this visit?  No      Plan:     I have personally reviewed and noted the following in the patient's chart:   Medical and social history Use of alcohol, tobacco or illicit drugs  Current medications and supplements including opioid prescriptions. Patient is  not currently taking opioid prescriptions. Functional ability and status Nutritional status Physical activity Advanced directives List of other physicians Hospitalizations, surgeries, and ER visits in previous 12 months Vitals Screenings to include cognitive, depression, and falls Referrals and appointments  In addition, I have reviewed and discussed with patient certain preventive protocols, quality metrics, and best practice recommendations. A written personalized care plan for preventive services as well as general preventive health recommendations were provided to patient.     Quentin Angst, [place]   09/05/2022

## 2022-09-05 NOTE — Patient Instructions (Signed)
  [name] , Thank you for taking time to come for your Medicare Wellness Visit. I appreciate your ongoing commitment to your health goals. Please review the following plan we discussed and let me know if I can assist you in the future.   These are the goals we discussed:  Goals      Patient Stated     Continue to exercise, Get back out more.     Prevent falls        This is a list of the screening recommended for you and due dates:  Health Maintenance  Topic Date Due   Zoster (Shingles) Vaccine (1 of 2) Never done   Flu Shot  09/09/2022*   COVID-19 Vaccine (1) 12/07/2022*   Eye exam for diabetics  12/13/2022   Hemoglobin A1C  02/15/2023   Complete foot exam   08/18/2023   Tetanus Vaccine  06/16/2030   Pneumonia Vaccine  Completed   DEXA scan (bone density measurement)  Completed   HPV Vaccine  Aged Out  *Topic was postponed. The date shown is not the original due date.

## 2022-09-11 ENCOUNTER — Other Ambulatory Visit: Payer: Self-pay | Admitting: Nurse Practitioner

## 2022-09-11 DIAGNOSIS — E119 Type 2 diabetes mellitus without complications: Secondary | ICD-10-CM

## 2022-09-21 ENCOUNTER — Ambulatory Visit: Payer: Medicare Other | Admitting: Obstetrics & Gynecology

## 2022-09-26 ENCOUNTER — Encounter: Payer: Self-pay | Admitting: Obstetrics & Gynecology

## 2022-09-26 ENCOUNTER — Ambulatory Visit: Payer: Medicare HMO | Admitting: Obstetrics & Gynecology

## 2022-09-26 VITALS — BP 152/75 | Ht <= 58 in | Wt 172.0 lb

## 2022-09-26 DIAGNOSIS — N813 Complete uterovaginal prolapse: Secondary | ICD-10-CM

## 2022-09-26 DIAGNOSIS — Z4689 Encounter for fitting and adjustment of other specified devices: Secondary | ICD-10-CM

## 2022-09-26 NOTE — Progress Notes (Signed)
Chief Complaint  Patient presents with   pessary maintenance    Blood pressure (!) 152/75, height '4\' 8"'$  (1.422 m), weight 172 lb (78 kg).  Kimberly Best presents today for routine follow up related to her pessary.   She uses a Milex ring with support #5 She reports no vaginal discharge and no vaginal bleeding   Likert scale(1 not bothersome -5 very bothersome)  :  1  Exam reveals no undue vaginal mucosal pressure of breakdown, no discharge and no vaginal bleeding.  Vaginal Epithelial Abnormality Classification System:   0 0    No abnormalities 1    Epithelial erythema 2    Granulation tissue 3    Epithelial break or erosion, 1 cm or less 4    Epithelial break or erosion, 1 cm or greater  The pessary is removed, cleaned and replaced without difficulty.      ICD-10-CM   1. Pessary maintenance, refit 10/18/20 with Milex ring with support #5  Z46.89     2. Uterovaginal prolapse, complete  N81.3        Kimberly Best will be sen back in 4 months for continued follow up.  Florian Buff, MD  09/26/2022 10:13 AM

## 2022-10-10 ENCOUNTER — Other Ambulatory Visit: Payer: Self-pay | Admitting: Nurse Practitioner

## 2022-10-16 ENCOUNTER — Other Ambulatory Visit (HOSPITAL_COMMUNITY): Payer: Self-pay | Admitting: Family Medicine

## 2022-10-16 ENCOUNTER — Other Ambulatory Visit: Payer: Self-pay | Admitting: Nurse Practitioner

## 2022-10-16 DIAGNOSIS — Z1231 Encounter for screening mammogram for malignant neoplasm of breast: Secondary | ICD-10-CM

## 2022-11-22 ENCOUNTER — Other Ambulatory Visit: Payer: Self-pay | Admitting: Family Medicine

## 2022-11-23 ENCOUNTER — Ambulatory Visit (HOSPITAL_COMMUNITY): Payer: Medicare HMO

## 2022-11-27 ENCOUNTER — Encounter: Payer: Self-pay | Admitting: Obstetrics & Gynecology

## 2022-11-27 ENCOUNTER — Ambulatory Visit (HOSPITAL_COMMUNITY)
Admission: RE | Admit: 2022-11-27 | Discharge: 2022-11-27 | Disposition: A | Discharge status: Home / Self Care | Payer: Medicare HMO | Source: Ambulatory Visit | Attending: Family Medicine | Admitting: Family Medicine

## 2022-11-27 DIAGNOSIS — Z1231 Encounter for screening mammogram for malignant neoplasm of breast: Secondary | ICD-10-CM | POA: Insufficient documentation

## 2022-11-28 ENCOUNTER — Other Ambulatory Visit: Payer: Self-pay | Admitting: Nurse Practitioner

## 2022-11-28 DIAGNOSIS — K219 Gastro-esophageal reflux disease without esophagitis: Secondary | ICD-10-CM

## 2022-12-12 ENCOUNTER — Other Ambulatory Visit: Payer: Self-pay | Admitting: Family Medicine

## 2022-12-12 DIAGNOSIS — E119 Type 2 diabetes mellitus without complications: Secondary | ICD-10-CM

## 2022-12-14 ENCOUNTER — Telehealth: Payer: Self-pay | Admitting: Family Medicine

## 2022-12-14 NOTE — Telephone Encounter (Signed)
Patient called insurance will not cover her medicine can something else be called in or has samples she can pick up. Not able to afford it on a fixed income.  JARDIANCE 10 MG TABS tablet [[id number]]   Pharmacy  [place], [place] - [address], [place] [address] Phone: [phone]  Fax: [phone] DEA #: --

## 2022-12-15 ENCOUNTER — Other Ambulatory Visit: Payer: Self-pay

## 2022-12-15 NOTE — Telephone Encounter (Signed)
Patient calling back, pharmacy has not received anything. Patient asking for samples or another rx different said the medicine  JARDIANCE 10 MG TABS tablet [[id number] too strong for her blood and on a fixed income.  Patient has been out of medicine since Monday. Patient said was taken off the metformin and was given the Jardiance. Please return patient call or send into her pharmacy medicine.

## 2022-12-15 NOTE — Telephone Encounter (Signed)
Spoke to pt let her know Peter Congo has not called anything in yet as soon as she does I will give her a call to let her know, pt verbalized understanding.

## 2022-12-18 ENCOUNTER — Ambulatory Visit (INDEPENDENT_AMBULATORY_CARE_PROVIDER_SITE_OTHER): Payer: Medicare HMO | Admitting: Family Medicine

## 2022-12-18 ENCOUNTER — Encounter: Payer: Self-pay | Admitting: Family Medicine

## 2022-12-18 ENCOUNTER — Other Ambulatory Visit: Payer: Self-pay | Admitting: Family Medicine

## 2022-12-18 VITALS — BP 152/72 | HR 78 | Ht <= 58 in | Wt 169.1 lb

## 2022-12-18 DIAGNOSIS — E118 Type 2 diabetes mellitus with unspecified complications: Secondary | ICD-10-CM | POA: Diagnosis not present

## 2022-12-18 DIAGNOSIS — Z0001 Encounter for general adult medical examination with abnormal findings: Secondary | ICD-10-CM | POA: Insufficient documentation

## 2022-12-18 DIAGNOSIS — I10 Essential (primary) hypertension: Secondary | ICD-10-CM

## 2022-12-18 DIAGNOSIS — E038 Other specified hypothyroidism: Secondary | ICD-10-CM

## 2022-12-18 DIAGNOSIS — E785 Hyperlipidemia, unspecified: Secondary | ICD-10-CM | POA: Diagnosis not present

## 2022-12-18 DIAGNOSIS — E559 Vitamin D deficiency, unspecified: Secondary | ICD-10-CM

## 2022-12-18 DIAGNOSIS — R21 Rash and other nonspecific skin eruption: Secondary | ICD-10-CM | POA: Insufficient documentation

## 2022-12-18 DIAGNOSIS — E119 Type 2 diabetes mellitus without complications: Secondary | ICD-10-CM

## 2022-12-18 MED ORDER — CLOTRIMAZOLE-BETAMETHASONE 1-0.05 % EX CREA: TOPICAL_CREAM | CUTANEOUS | 0 refills | Status: AC

## 2022-12-18 MED ORDER — GLIMEPIRIDE 2 MG PO TABS: 2.0000 mg | ORAL_TABLET | Freq: Every day | ORAL | 2 refills | Status: DC

## 2022-12-18 MED ORDER — EMPAGLIFLOZIN 10 MG PO TABS: 10.0000 mg | ORAL_TABLET | Freq: Every day | ORAL | 1 refills | Status: DC

## 2022-12-18 MED ORDER — AMLODIPINE BESYLATE 2.5 MG PO TABS: 2.5000 mg | ORAL_TABLET | Freq: Every day | ORAL | 1 refills | Status: DC

## 2022-12-18 NOTE — Assessment & Plan Note (Signed)
Refilled Jardiance 10 mg daily Encouraged to take glyburide 2 mg daily She denies polyuria, polyphagia, polydipsia Will assess hemoglobin A1c today

## 2022-12-18 NOTE — Progress Notes (Signed)
Complete physical exam  Patient: Kimberly Best   DOB: 12/28/1932   89 y.o. Female  MRN: [id number]  Subjective:    Chief Complaint  Patient presents with   Annual Exam    Cpe today, needs a refill on Jardiance, and amlodipine.    Kimberly Best is a 89 y.o. female who presents today for a complete physical exam. She reports consuming a general diet. Home exercise routine includes walking in her home. She generally feels well. She reports sleeping well. She does not have additional problems to discuss today.    Most recent fall risk assessment:    12/18/2022   10:54 AM  Fall Risk   Falls in the past year? 1  Number falls in past yr: 1  Injury with Fall? 0  Risk for fall due to : No Fall Risks  Follow up Falls evaluation completed     Most recent depression screenings:    12/18/2022   10:54 AM 09/05/2022    3:48 PM  PHQ 2/9 Scores  PHQ - 2 Score 0 0  PHQ- 9 Score 3     Vision:Not within last year   Patient Active Problem List   Diagnosis Date Noted   Rash 12/18/2022   Encounter for annual general medical examination with abnormal findings in adult 12/18/2022   High blood pressure 04/13/2022   Seasonal allergic rhinitis 04/13/2022   Neck pain, acute 04/13/2022   Environmental allergies 01/30/2022   Intermittent vertigo 01/30/2022   Vaccine counseling 12/06/2021   Morbid obesity ([place]) 12/06/2021   Gastric erosion    Rib pain on right side 01/17/2021   H. pylori infection 07/14/2020   NSAID long-term use 07/14/2020   Anemia 04/14/2020   Pessary maintenance 04/14/2020   Gastroesophageal reflux disease 01/14/2020   Osteoarthritis of right shoulder 01/14/2020   Ear pain, bilateral 12/17/2019   Osteoporosis 09/16/2019   Controlled diabetes mellitus type 2 with complications ([place]) 24/23/5361   Hyperlipidemia LDL goal <100 09/16/2019   Problem with vaginal pessary ([place]) 09/03/2018   Fitting and adjustment of pessary 03/10/2015   Uterovaginal prolapse, complete  02/18/2015   Complete uterovaginal prolapse 02/01/2015   Female stress incontinence 06/10/2014   Urge incontinence 06/10/2014   Past Medical History:  Diagnosis Date   Allergy    Anxiety    Arthritis    Cancer ([place])    right breast cancer   Diabetes mellitus without complication ([place])    GERD (gastroesophageal reflux disease)    H. pylori infection 05/2020   S/p treatment with Pylera.  H. pylori stool antigen negative on 12/21/2020.   HLD (hyperlipidemia)    Hypertension    IDA (iron deficiency anemia)    Hg in 9-10 range since September 2019.   Pessary maintenance 03/10/2015   Past Surgical History:  Procedure Laterality Date   BIOPSY  05/26/2020   Procedure: BIOPSY;  Surgeon: Daneil Dolin, MD;  Location: [facility];  Service: Endoscopy;;   BREAST SURGERY Right    lumpectomy    CHOLECYSTECTOMY     COLONOSCOPY N/A 05/26/2020   Procedure: COLONOSCOPY;  Surgeon: Daneil Dolin, MD; Diverticulosis in sigmoid colon, friable colonic mucosa likely secondary to ASA/NSAID insult, exam was otherwise normal.     ESOPHAGOGASTRODUODENOSCOPY N/A 05/26/2020   Procedure: ESOPHAGOGASTRODUODENOSCOPY (EGD);  Surgeon: Daneil Dolin, MD;  Normal esophagus s/p dilation, mucosal changes in the stomach biopsied, normal examined duodenum.  Pathology revealed H. pylori.   EYE SURGERY  GIVENS CAPSULE STUDY N/A 02/16/2021   Procedure: GIVENS CAPSULE STUDY;  Surgeon: Daneil Dolin, MD;  gastric and duodenal bulbar erosions, 2 nonbleeding small bowel ulcers with benign appearance suspected to be secondary to chronic aspirin use.   Social History   Tobacco Use   Smoking status: Never   Smokeless tobacco: Never  Vaping Use   Vaping Use: Never used  Substance Use Topics   Alcohol use: Not Currently    Comment: Couple ounces of champagne once every couple of weeks or less.   Drug use: No   Social History   Socioeconomic History   Marital status: Married    Spouse name: Not on file    Number of children: Not on file   Years of education: Not on file   Highest education level: Not on file  Occupational History   Not on file  Tobacco Use   Smoking status: Never   Smokeless tobacco: Never  Vaping Use   Vaping Use: Never used  Substance and Sexual Activity   Alcohol use: Not Currently    Comment: Couple ounces of champagne once every couple of weeks or less.   Drug use: No   Sexual activity: Not Currently    Birth control/protection: Post-menopausal  Other Topics Concern   Not on file  Social History Narrative   Widow since 2015,married for 72 years.Lives alone.Retired.   Social Determinants of Health   Financial Resource Strain: Low Risk  (09/05/2022)   Overall Financial Resource Strain (CARDIA)    Difficulty of Paying Living Expenses: Not hard at all  Food Insecurity: No Food Insecurity (09/05/2022)   Hunger Vital Sign    Worried About Running Out of Food in the Last Year: Never true    Ran Out of Food in the Last Year: Never true  Transportation Needs: No Transportation Needs (09/05/2022)   PRAPARE - Hydrologist (Medical): No    Lack of Transportation (Non-Medical): No  Physical Activity: Insufficiently Active (09/05/2022)   Exercise Vital Sign    Days of Exercise per Week: 3 days    Minutes of Exercise per Session: 30 min  Stress: No Stress Concern Present (09/05/2022)   [place]    Feeling of Stress : Not at all  Social Connections: Unknown (09/05/2022)   Social Connection and Isolation Panel [NHANES]    Frequency of Communication with Friends and Family: More than three times a week    Frequency of Social Gatherings with Friends and Family: More than three times a week    Attends Religious Services: Never    Marine scientist or Organizations: No    Attends Archivist Meetings: Never    Marital Status: Patient refused  Intimate Partner  Violence: Not At Risk (09/05/2022)   Humiliation, Afraid, Rape, and Kick questionnaire    Fear of Current or Ex-Partner: No    Emotionally Abused: No    Physically Abused: No    Sexually Abused: No   Family Status  Relation Name Status   Father  Deceased   Mother  Deceased   Sister  Alive   Brother  Deceased   Daughter  Alive   Son  Alive   MGM  Deceased   MGF  Deceased   PGM  Deceased   PGF  Deceased   Neg Hx  (Not Specified)   Family History  Problem Relation Age of Onset   Cancer Father  Stroke Mother    Hypertension Mother    Diabetes Sister    Hypertension Sister    Cancer Brother    Hypertension Brother    Bipolar disorder Daughter    Hypertension Maternal Grandmother    Tuberculosis Paternal Grandmother    Hypertension Paternal Grandmother    Alcohol abuse Paternal Grandfather    Hypertension Paternal Grandfather    Colon cancer Neg Hx    Allergies  Allergen Reactions   Lisinopril Other (See Comments)    Tongue swelling   Penicillins Swelling    Has patient had a PCN reaction causing immediate rash, facial/tongue/throat swelling, SOB or lightheadedness with hypotension: Yes Has patient had a PCN reaction causing severe rash involving mucus membranes or skin necrosis: No Has patient had a PCN reaction that required hospitalization: No Has patient had a PCN reaction occurring within the last 10 years: No If all of the above answers are "NO", then may proceed with Cephalosporin use.      Patient Care Team: Alvira Monday, FNP as PCP - General (Family Medicine) Daneil Dolin, MD as Consulting Physician (Gastroenterology)   Outpatient Medications Prior to Visit  Medication Sig   acetaminophen (TYLENOL) 650 MG CR tablet Take 650 mg by mouth 2 (two) times daily as needed for pain.   cholecalciferol (VITAMIN D) 1000 UNITS tablet Take 1,000 Units by mouth daily.   fluticasone (FLONASE) 50 MCG/ACT nasal spray Place 2 sprays into both nostrils daily.    Lancets (ONETOUCH DELICA PLUS [id number]) MISC USE AS DIRECTED TO MONITOR BLOOD SUGAR TWICE DAILY.   meclizine (ANTIVERT) 25 MG tablet Take 1 tablet (25 mg total) by mouth 3 (three) times daily as needed for dizziness.   montelukast (SINGULAIR) 10 MG tablet TAKE ONE TABLET ('10MG'$  TOTAL) BY MOUTH ATBEDTIME   Multiple Vitamins-Minerals (IMMUNE SUPPORT PO) Take 1 tablet by mouth daily.   ONETOUCH ULTRA test strip USE AS DIRECTED TO MONITOR BLOOD SUGAR TWICE DAILY.   pantoprazole (PROTONIX) 40 MG tablet TAKE ONE TABLET ('40MG'$ ) BY MOUTH DAILY   rosuvastatin (CRESTOR) 5 MG tablet TAKE ONE (1) TABLET BY MOUTH EVERY DAY   SLOW FE 142 (45 Fe) MG TBCR Take 2 tablets by mouth every morning. (Patient taking differently: Take 1 tablet by mouth every morning.)   [DISCONTINUED] amLODipine (NORVASC) 2.5 MG tablet TAKE ONE TABLET (2.'5MG'$  TOTAL) BY MOUTH DAILY   [DISCONTINUED] clotrimazole-betamethasone (LOTRISONE) cream For up to 14 days and then stop the medication.   [DISCONTINUED] JARDIANCE 10 MG TABS tablet TAKE ONE TABLET ('10MG'$  TOTAL) BY MOUTH DAILY BEFORE BREAKFAST   No facility-administered medications prior to visit.    Review of Systems  Constitutional:  Negative for chills, fever and malaise/fatigue.  HENT:  Negative for congestion and sinus pain.   Eyes:  Negative for pain, discharge and redness.  Respiratory:  Negative for cough, sputum production and shortness of breath.   Cardiovascular:  Negative for chest pain, palpitations, claudication and leg swelling.  Gastrointestinal:  Negative for diarrhea, heartburn and nausea.  Genitourinary:  Negative for flank pain and frequency.  Musculoskeletal:  Negative for back pain and joint pain.  Skin:  Positive for rash. Negative for itching.  Neurological:  Negative for dizziness, seizures and headaches.  Endo/Heme/Allergies:  Negative for environmental allergies.  Psychiatric/Behavioral:  Negative for memory loss. The patient does not have insomnia.         Objective:    BP (!) 152/72 (BP Location: Left Arm)   Pulse 78   Ht 4'  8" (1.422 m)   Wt 169 lb 1.9 oz (76.7 kg)   SpO2 97%   BMI 37.92 kg/m  BP Readings from Last 3 Encounters:  12/18/22 (!) 152/72  09/26/22 (!) 152/75  08/17/22 132/78   Wt Readings from Last 3 Encounters:  12/18/22 169 lb 1.9 oz (76.7 kg)  09/26/22 172 lb (78 kg)  08/17/22 170 lb (77.1 kg)      Physical Exam HENT:     Head: Normocephalic.     Left Ear: External ear normal.     Mouth/Throat:     Mouth: Mucous membranes are moist.     Dentition: Has dentures.  Eyes:     Extraocular Movements: Extraocular movements intact.     Pupils: Pupils are equal, round, and reactive to light.  Cardiovascular:     Rate and Rhythm: Normal rate and regular rhythm.     Heart sounds: No murmur heard. Pulmonary:     Effort: Pulmonary effort is normal.     Breath sounds: Normal breath sounds.  Abdominal:     Palpations: Abdomen is soft.     Tenderness: There is no right CVA tenderness or left CVA tenderness.  Musculoskeletal:     Right lower leg: No edema.     Left lower leg: No edema.  Skin:    Findings: No lesion or rash.     Comments: Erythematous pruritic rash noted on her lower back  Neurological:     Mental Status: She is alert and oriented to person, place, and time.     GCS: GCS eye subscore is 4. GCS verbal subscore is 5. GCS motor subscore is 6.     Cranial Nerves: No facial asymmetry.     Sensory: No sensory deficit.     Motor: No weakness.     Coordination: Coordination normal. Finger-Nose-Finger Test normal.     Gait: Gait (She uses her cane to ambulate) normal.  Psychiatric:        Judgment: Judgment normal.     No results found for any visits on 12/18/22. Last CBC Lab Results  Component Value Date   WBC 5.2 04/13/2022   HGB 11.3 04/13/2022   HCT 34.6 04/13/2022   MCV 89 04/13/2022   MCH 29.0 04/13/2022   RDW 13.6 04/13/2022   PLT 265 71/05/2693   Last metabolic panel Lab  Results  Component Value Date   GLUCOSE 77 08/17/2022   NA 141 08/17/2022   K 4.6 08/17/2022   CL 101 08/17/2022   CO2 25 08/17/2022   BUN 13 08/17/2022   CREATININE 0.72 08/17/2022   EGFR 81 08/17/2022   CALCIUM 9.9 08/17/2022   PROT 7.4 04/13/2022   ALBUMIN 4.6 04/13/2022   LABGLOB 2.8 04/13/2022   AGRATIO 1.6 04/13/2022   BILITOT 0.5 04/13/2022   ALKPHOS 72 04/13/2022   AST 19 04/13/2022   ALT 9 04/13/2022   ANIONGAP 9 07/18/2021   Last lipids Lab Results  Component Value Date   CHOL 156 08/17/2022   HDL 54 08/17/2022   LDLCALC 81 08/17/2022   TRIG 117 08/17/2022   CHOLHDL 2.9 08/17/2022   Last hemoglobin A1c Lab Results  Component Value Date   HGBA1C 6.6 (H) 08/17/2022   Last thyroid functions Lab Results  Component Value Date   TSH 0.865 12/01/2021   Last vitamin D Lab Results  Component Value Date   VD25OH 36 06/09/2021   Last vitamin B12 and Folate Lab Results  Component Value Date   [id number] 093  03/24/2020        Assessment & Plan:    Routine Health Maintenance and Physical Exam  Immunization History  Administered Date(s) Administered   Fluad Quad(high Dose 65+) 12/13/2022   Influenza, High Dose Seasonal PF 09/21/2020   Influenza-Unspecified 09/27/2016, 09/30/2018, 08/21/2019   Pneumococcal Conjugate-13 08/18/2014   Pneumococcal Polysaccharide-23 08/24/2015   Tdap 06/16/2020    Health Maintenance  Topic Date Due   COVID-19 Vaccine (1) Never done   Zoster Vaccines- Shingrix (1 of 2) Never done   OPHTHALMOLOGY EXAM  12/13/2022   HEMOGLOBIN A1C  02/15/2023   FOOT EXAM  08/18/2023   Medicare Annual Wellness (AWV)  09/06/2023   DTaP/Tdap/Td (2 - Td or Tdap) 06/16/2030   Pneumonia Vaccine 75+ Years old  Completed   INFLUENZA VACCINE  Completed   DEXA SCAN  Completed   HPV VACCINES  Aged Out    Discussed health benefits of physical activity, and encouraged her to engage in regular exercise appropriate for her age and  condition.  Encounter for annual general medical examination with abnormal findings in adult Assessment & Plan: Physical exam as documented Counseling is done on healthy lifestyle involving commitment to 150 minutes of exercise per week Patient is due for her shingles vaccination Encouraged to get her vaccination at her local pharmacy Patient verbalized understanding        Controlled type 2 diabetes mellitus with complication, without long-term current use of insulin (HCC) Assessment & Plan: Refilled Jardiance 10 mg daily Encouraged to take glyburide 2 mg daily She denies polyuria, polyphagia, polydipsia Will assess hemoglobin A1c today  Orders: -     Hemoglobin A1c -     Glimepiride; Take 1 tablet (2 mg total) by mouth daily before breakfast.  Dispense: 30 tablet; Refill: 2  Controlled type 2 diabetes mellitus without complication, without long-term current use of insulin (HCC) -     Empagliflozin; Take 1 tablet (10 mg total) by mouth daily.  Dispense: 90 tablet; Refill: 1  Hyperlipidemia LDL goal <100 -     Lipid panel  Hypertension, unspecified type Assessment & Plan: Uncontrolled She reports being out of her medication for a week Amlodipine 2.5 mg refilled sent to the pharmacy Encouraged to resume treatment   Orders: -     amLODIPine Besylate; Take 1 tablet (2.5 mg total) by mouth daily.  Dispense: 90 tablet; Refill: 1  Rash Assessment & Plan: Chronic erythematous pruritic rash noted on the lower back Will treat with topical corticosteroids  Orders: -     Clotrimazole-Betamethasone; For up to 14 days and then stop the medication.  Dispense: 45 g; Refill: 0 -     CMP14+EGFR -     CBC with Differential/Platelet  Vitamin D deficiency -     VITAMIN D 25 Hydroxy (Vit-D Deficiency, Fractures)  Other specified hypothyroidism -     TSH + free T4    Return in about 3 months (around 03/19/2023).     Alvira Monday, FNP

## 2022-12-18 NOTE — Assessment & Plan Note (Signed)
Chronic erythematous pruritic rash noted on the lower back Will treat with topical corticosteroids

## 2022-12-18 NOTE — Assessment & Plan Note (Signed)
Physical exam as documented Counseling is done on healthy lifestyle involving commitment to 150 minutes of exercise per week Patient is due for her shingles vaccination Encouraged to get her vaccination at her local pharmacy Patient verbalized understanding

## 2022-12-18 NOTE — Assessment & Plan Note (Addendum)
Uncontrolled She reports being out of her medication for a week Amlodipine 2.5 mg refilled sent to the pharmacy Encouraged to resume treatment

## 2022-12-18 NOTE — Patient Instructions (Addendum)
I appreciate the opportunity to provide care to you today!    Follow up:  3 months  Labs: fasting labs during the week  Please pick up your medications at the pharmacy  Physical activity helps: Lower your blood glucose, improve your heart health, lower your blood pressure and cholesterol, burn calories to help manage her weight, gave you energy, lower stress, and improve his sleep.  The American diabetes Association (ADA) recommends being active for 2-1/2 hours (150 minutes) or more week.  Exercise for 30 minutes, 5 days a week (150 minutes total)    Please continue to a heart-healthy diet and increase your physical activities.     It was a pleasure to see you and I look forward to continuing to work together on your health and well-being. Please do not hesitate to call the office if you need care or have questions about your care.   Have a wonderful day and week. With Gratitude, Alvira Monday MSN, FNP-BC

## 2022-12-18 NOTE — Telephone Encounter (Signed)
Please inform the patient that a prescription for glimepiride 2 mg daily has been sent to her pharmacy.  Please encourage the patient to start therapy after completing the 7 days sample provided for Jardiance 10 mg today.

## 2022-12-19 NOTE — Telephone Encounter (Signed)
Pt informed/verbalized understanding

## 2022-12-19 NOTE — Telephone Encounter (Signed)
Attempted to contact pt unable to leave message.

## 2022-12-20 LAB — CBC WITH DIFFERENTIAL/PLATELET
Basophils Absolute: 0 10*3/uL (ref 0.0–0.2)
Basos: 0 %
EOS (ABSOLUTE): 0.1 10*3/uL (ref 0.0–0.4)
Eos: 2 %
Hematocrit: 32.7 % — ABNORMAL LOW (ref 34.0–46.6)
Hemoglobin: 11.1 g/dL (ref 11.1–15.9)
Immature Grans (Abs): 0 10*3/uL (ref 0.0–0.1)
Immature Granulocytes: 1 %
Lymphocytes Absolute: 1.8 10*3/uL (ref 0.7–3.1)
Lymphs: 40 %
MCH: 29.4 pg (ref 26.6–33.0)
MCHC: 33.9 g/dL (ref 31.5–35.7)
MCV: 87 fL (ref 79–97)
Monocytes Absolute: 0.6 10*3/uL (ref 0.1–0.9)
Monocytes: 14 %
Neutrophils Absolute: 2 10*3/uL (ref 1.4–7.0)
Neutrophils: 43 %
Platelets: 236 10*3/uL (ref 150–450)
RBC: 3.78 x10E6/uL (ref 3.77–5.28)
RDW: 13.9 % (ref 11.7–15.4)
WBC: 4.4 10*3/uL (ref 3.4–10.8)

## 2022-12-20 LAB — CMP14+EGFR
ALT: 7 IU/L (ref 0–32)
AST: 17 IU/L (ref 0–40)
Albumin/Globulin Ratio: 1.4 (ref 1.2–2.2)
Albumin: 4.3 g/dL (ref 3.7–4.7)
Alkaline Phosphatase: 58 IU/L (ref 44–121)
BUN/Creatinine Ratio: 22 (ref 12–28)
BUN: 14 mg/dL (ref 8–27)
Bilirubin Total: 0.4 mg/dL (ref 0.0–1.2)
CO2: 25 mmol/L (ref 20–29)
Calcium: 9.1 mg/dL (ref 8.7–10.3)
Chloride: 105 mmol/L (ref 96–106)
Creatinine, Ser: 0.65 mg/dL (ref 0.57–1.00)
Globulin, Total: 3 g/dL (ref 1.5–4.5)
Glucose: 95 mg/dL (ref 70–99)
Potassium: 4.2 mmol/L (ref 3.5–5.2)
Sodium: 144 mmol/L (ref 134–144)
Total Protein: 7.3 g/dL (ref 6.0–8.5)
eGFR: 85 mL/min/{1.73_m2} (ref >59)

## 2022-12-20 LAB — HEMOGLOBIN A1C
Est. average glucose Bld gHb Est-mCnc: 154 mg/dL
Hgb A1c MFr Bld: 7 % — ABNORMAL HIGH (ref 4.8–5.6)

## 2022-12-20 LAB — LIPID PANEL
Chol/HDL Ratio: 2.7 ratio (ref 0.0–4.4)
Cholesterol, Total: 141 mg/dL (ref 100–199)
HDL: 53 mg/dL (ref >39)
LDL Chol Calc (NIH): 71 mg/dL (ref 0–99)
Triglycerides: 91 mg/dL (ref 0–149)
VLDL Cholesterol Cal: 17 mg/dL (ref 5–40)

## 2022-12-20 LAB — TSH+FREE T4
Free T4: 1.15 ng/dL (ref 0.82–1.77)
TSH: 1.11 u[IU]/mL (ref 0.450–4.500)

## 2022-12-20 LAB — VITAMIN D 25 HYDROXY (VIT D DEFICIENCY, FRACTURES): Vit D, 25-Hydroxy: 47.2 ng/mL (ref 30.0–100.0)

## 2022-12-22 ENCOUNTER — Other Ambulatory Visit: Payer: Self-pay

## 2023-01-01 ENCOUNTER — Other Ambulatory Visit: Payer: Self-pay | Admitting: Family Medicine

## 2023-01-01 DIAGNOSIS — E118 Type 2 diabetes mellitus with unspecified complications: Secondary | ICD-10-CM

## 2023-01-01 MED ORDER — GLIMEPIRIDE 4 MG PO TABS: 4.0000 mg | ORAL_TABLET | Freq: Every day | ORAL | 3 refills | Status: DC

## 2023-01-01 MED ORDER — EMPAGLIFLOZIN 25 MG PO TABS: 25.0000 mg | ORAL_TABLET | Freq: Every day | ORAL | 3 refills | Status: DC

## 2023-01-01 NOTE — Progress Notes (Signed)
Please inform the patient that her hemoglobin A1c has increased from 6.6 to 7.0.  Please advise the patient to take 2 tablets( '20mg'$  total) of her Jardiance; given that her prescription was just refilled, once completed, she can pick up her new prescription of Jardiance 25 mg to take once daily.  I will also increase the dose of her glimepiride from 2 mg to 4 mg.  Please encourage the patient to take 2 tablets ('4mg'$  total) of her glimepiride until her current prescription runs out, and she can pick up her new prescription of glimepiride 4 mg to take.  I recommend decreasing her intake of high-sugar foods with increased physical activity.  All other labs are stable.

## 2023-01-02 DIAGNOSIS — E119 Type 2 diabetes mellitus without complications: Secondary | ICD-10-CM | POA: Diagnosis not present

## 2023-01-02 DIAGNOSIS — H0102A Squamous blepharitis right eye, upper and lower eyelids: Secondary | ICD-10-CM | POA: Diagnosis not present

## 2023-01-02 DIAGNOSIS — H1045 Other chronic allergic conjunctivitis: Secondary | ICD-10-CM | POA: Diagnosis not present

## 2023-01-02 DIAGNOSIS — Z961 Presence of intraocular lens: Secondary | ICD-10-CM | POA: Diagnosis not present

## 2023-01-02 DIAGNOSIS — H0102B Squamous blepharitis left eye, upper and lower eyelids: Secondary | ICD-10-CM | POA: Diagnosis not present

## 2023-01-08 ENCOUNTER — Other Ambulatory Visit: Payer: Self-pay

## 2023-01-26 ENCOUNTER — Ambulatory Visit: Payer: Medicare HMO | Admitting: Obstetrics & Gynecology

## 2023-01-29 ENCOUNTER — Other Ambulatory Visit: Payer: Self-pay

## 2023-01-30 ENCOUNTER — Ambulatory Visit: Payer: Medicare HMO | Admitting: Obstetrics & Gynecology

## 2023-01-30 ENCOUNTER — Encounter: Payer: Self-pay | Admitting: Obstetrics & Gynecology

## 2023-01-30 VITALS — BP 129/70 | HR 75 | Ht <= 58 in | Wt 170.0 lb

## 2023-01-30 DIAGNOSIS — Z4689 Encounter for fitting and adjustment of other specified devices: Secondary | ICD-10-CM | POA: Diagnosis not present

## 2023-01-30 DIAGNOSIS — N813 Complete uterovaginal prolapse: Secondary | ICD-10-CM | POA: Diagnosis not present

## 2023-01-30 NOTE — Progress Notes (Signed)
Chief Complaint  Patient presents with   Pessary Maintenance    Blood pressure 129/70, pulse 75, height 4' 8"$  (1.422 m), weight 170 lb (77.1 kg).  Kimberly Best presents today for routine follow up related to her pessary.   She uses a Milex ring with support #5 She reports no vaginal discharge and no vaginal bleeding   Likert scale(1 not bothersome -5 very bothersome)  :  1  Exam reveals no undue vaginal mucosal pressure of breakdown, no discharge and no vaginal bleeding.  Vaginal Epithelial Abnormality Classification System:   0 0    No abnormalities 1    Epithelial erythema 2    Granulation tissue 3    Epithelial break or erosion, 1 cm or less 4    Epithelial break or erosion, 1 cm or greater  The pessary is removed, cleaned and replaced without difficulty.      ICD-10-CM   1. Pessary maintenance, refit 10/18/20 with Milex ring with support #5  Z46.89     2. Uterovaginal prolapse, complete  N81.3        Kimberly Best will be sen back in 4 months for continued follow up.  Florian Buff, MD  01/30/2023 10:31 AM

## 2023-01-31 ENCOUNTER — Other Ambulatory Visit: Payer: Self-pay | Admitting: Family Medicine

## 2023-02-27 ENCOUNTER — Other Ambulatory Visit: Payer: Self-pay | Admitting: Family Medicine

## 2023-02-27 DIAGNOSIS — K219 Gastro-esophageal reflux disease without esophagitis: Secondary | ICD-10-CM

## 2023-04-03 ENCOUNTER — Other Ambulatory Visit: Payer: Self-pay | Admitting: Family Medicine

## 2023-04-03 DIAGNOSIS — R42 Dizziness and giddiness: Secondary | ICD-10-CM

## 2023-04-12 ENCOUNTER — Other Ambulatory Visit: Payer: Self-pay | Admitting: Family Medicine

## 2023-04-23 ENCOUNTER — Encounter: Payer: Self-pay | Admitting: Internal Medicine

## 2023-04-23 ENCOUNTER — Ambulatory Visit (INDEPENDENT_AMBULATORY_CARE_PROVIDER_SITE_OTHER): Payer: Medicare HMO | Admitting: Internal Medicine

## 2023-04-23 VITALS — BP 139/71 | HR 98 | Ht <= 58 in | Wt 174.2 lb

## 2023-04-23 DIAGNOSIS — J329 Chronic sinusitis, unspecified: Secondary | ICD-10-CM | POA: Diagnosis not present

## 2023-04-23 MED ORDER — AZELASTINE HCL 0.1 % NA SOLN: 1.0000 | Freq: Two times a day (BID) | NASAL | 5 refills | Status: DC

## 2023-04-23 NOTE — Progress Notes (Signed)
Acute Office Visit  Subjective:     Patient ID: Kimberly Best, female    DOB: 1933/11/12, 89 y.o.   MRN: [id number]  Chief Complaint  Patient presents with   Dizziness    Had this before and the past few days it have gotten worse. Thinks it may be sinus messing up   Kimberly Best presents for an acute visit today endorsing a 2-3-week history of nasal/sinus congestion.  She has a past medical history significant for vertigo that is treated with meclizine.  Her symptoms have been worse recently and she believes it is due to feeling congested.  She has tried using fluticasone and taking Tylenol for symptom relief but this has been ineffective.  She is interested in additional treatment options today.  She denies fever/chills and is unaware of any recent sick contacts.  Kimberly Best does not have significant nasal secretions and is unaware of any change to the quality or quantity of her nasal secretions.  She denies cough or sputum production currently.  Review of Systems  Constitutional:  Negative for chills, fever and malaise/fatigue.  HENT:  Positive for congestion and sinus pain. Negative for sore throat and tinnitus.   Respiratory:  Negative for cough, sputum production, shortness of breath and wheezing.   Neurological:  Positive for dizziness and headaches.      Objective:    BP 139/71   Pulse 98   Ht 4\' 8"  (1.422 m)   Wt 174 lb 3.2 oz (79 kg)   SpO2 98%   BMI 39.05 kg/m  BP Readings from Last 3 Encounters:  04/23/23 139/71  01/30/23 129/70  12/18/22 (!) 152/72   Physical Exam Vitals reviewed.  Constitutional:      General: She is not in acute distress.    Appearance: Normal appearance. She is not toxic-appearing.  HENT:     Head: Normocephalic and atraumatic.     Comments: TTP over maxillary sinuses bilaterally    Right Ear: Tympanic membrane normal.     Left Ear: Tympanic membrane normal.     Nose: Congestion present.     Mouth/Throat:     Mouth: Mucous membranes are  moist.     Pharynx: Oropharynx is clear. No oropharyngeal exudate or posterior oropharyngeal erythema.  Eyes:     Extraocular Movements: Extraocular movements intact.     Conjunctiva/sclera: Conjunctivae normal.     Pupils: Pupils are equal, round, and reactive to light.     Comments: No nystagmus present on exam  Cardiovascular:     Rate and Rhythm: Normal rate and regular rhythm.  Pulmonary:     Effort: Pulmonary effort is normal.     Breath sounds: Normal breath sounds. No wheezing, rhonchi or rales.  Abdominal:     General: Abdomen is flat. Bowel sounds are normal.     Palpations: Abdomen is soft.  Musculoskeletal:     Cervical back: Normal range of motion and neck supple.  Lymphadenopathy:     Cervical: No cervical adenopathy.  Neurological:     General: No focal deficit present.     Mental Status: She is alert.     Gait: Gait abnormal (Ambulates with cane).  Psychiatric:        Behavior: Behavior normal.       Assessment & Plan:   Problem List Items Addressed This Visit       Rhinosinusitis - Primary    Presenting today for an acute visit endorsing a 2-3-week history of nasal/sinus congestion  with bilateral maxillary sinus pain/pressure.  Vertigo symptoms have been worse recently as well, however her acute concern today is sinus congestion.  Suspect there is eustachian tube dysfunction contributing to vertigo symptoms as well.  She has tried using fluticasone and Tylenol without significant symptomatic improvement. -Add azelastine nasal spray.  Continue use of fluticasone as well. -Can try Sudafed short-term for relief as a nasal decongestant, which can be effective in treating eustachian tube dysfunction as well -Recommended use of nasal saline rinse -She will return to care if her symptoms worsen or fail to improve, otherwise we will arrange follow-up with her PCP in 1 month.      Meds ordered this encounter  Medications   azelastine (ASTELIN) 0.1 % nasal spray     Sig: Place 1 spray into both nostrils 2 (two) times daily. Use in each nostril as directed    Dispense:  30 mL    Refill:  5    Return if symptoms worsen or fail to improve.  Billie Lade, MD

## 2023-04-23 NOTE — Assessment & Plan Note (Addendum)
Presenting today for an acute visit endorsing a 2-3-week history of nasal/sinus congestion with bilateral maxillary sinus pain/pressure.  Vertigo symptoms have been worse recently as well, however her acute concern today is sinus congestion.  Suspect there is eustachian tube dysfunction contributing to vertigo symptoms as well.  She has tried using fluticasone and Tylenol without significant symptomatic improvement. -Add azelastine nasal spray.  Continue use of fluticasone as well. -Can try Sudafed short-term for relief as a nasal decongestant, which can be effective in treating eustachian tube dysfunction as well -Recommended use of nasal saline rinse -She will return to care if her symptoms worsen or fail to improve, otherwise we will arrange follow-up with her PCP in 1 month.

## 2023-04-23 NOTE — Patient Instructions (Signed)
It was a pleasure to see you today.  Thank you for giving [place] the opportunity to be involved in your care.  Below is a brief recap of your visit and next steps.  We will plan to see you again in 1 month.  Summary Try Azelastine nasal spray in addition to fluticasone nasal spray for symptom relief. I recommend trying a nasal saline rinse as well. OK to try Sudafed briefly as well Continue as needed use of meclizine Follow up with Malachi Bonds in 1 month

## 2023-04-27 ENCOUNTER — Encounter: Payer: Self-pay | Admitting: Adult Health

## 2023-04-27 ENCOUNTER — Ambulatory Visit: Payer: Medicare HMO | Admitting: Adult Health

## 2023-04-27 VITALS — BP 135/75 | HR 88 | Ht <= 58 in | Wt 177.5 lb

## 2023-04-27 DIAGNOSIS — N813 Complete uterovaginal prolapse: Secondary | ICD-10-CM | POA: Diagnosis not present

## 2023-04-27 DIAGNOSIS — Z4689 Encounter for fitting and adjustment of other specified devices: Secondary | ICD-10-CM | POA: Diagnosis not present

## 2023-04-27 NOTE — Progress Notes (Signed)
  Subjective:     Patient ID: Kimberly Best, female   DOB: Sep 01, 1933, 89 y.o.   MRN: [id number]  HPI Kimberly Best is a 89 year old black female,married, PM in for pessary maintenance. She has noticed some blood.  PCP is Gilmore Laroche, NP  Review of Systems Has noticed a little blood Denies any discharge or odor Reviewed past medical,surgical, social and family history. Reviewed medications and allergies.     Objective:   Physical Exam BP 135/75 (BP Location: Left Arm, Patient Position: Sitting, Cuff Size: Normal)   Pulse 88   Ht 4\' 8"  (1.422 m)   Wt 177 lb 8 oz (80.5 kg)   BMI 39.79 kg/m     Skin warm and dry.Pelvic: external genitalia is normal in appearance no lesions, vagina: #5 milex ring with support removed, no vaginal lesions noted, +prolapse, pessary cleaned and dried and easily reinserted,urethra has no lesions or masses noted, cervix:smooth, uterus: normal size, shape and contour, non tender, no masses felt, adnexa: no masses or tenderness noted. Bladder is non tender and no masses felt. Examination chaperoned by Malachy Mood LPN    [facility] - 04/27/23 1253       Pregnancy Intention Screening   Does the patient want to become pregnant in the next year? N/A    Does the patient's partner want to become pregnant in the next year? N/A    Would the patient like to discuss contraceptive options today? N/A      Contraception Wrap Up   Current Method Abstinence   PM   End Method Abstinence   PM   Contraception Counseling Provided No             Assessment:     1. Pessary maintenance, refit 10/18/20 with Milex ring with support #5 Cleaned and reinserted   2. Uterovaginal prolapse, complete     Plan:     Return in 3 months for pessary maintenance

## 2023-05-16 ENCOUNTER — Encounter: Payer: Self-pay | Admitting: Internal Medicine

## 2023-05-16 ENCOUNTER — Ambulatory Visit (INDEPENDENT_AMBULATORY_CARE_PROVIDER_SITE_OTHER): Payer: Medicare HMO | Admitting: Internal Medicine

## 2023-05-16 VITALS — BP 147/72 | HR 88 | Resp 18 | Ht <= 58 in | Wt 181.0 lb

## 2023-05-16 DIAGNOSIS — J209 Acute bronchitis, unspecified: Secondary | ICD-10-CM | POA: Diagnosis not present

## 2023-05-16 MED ORDER — AZITHROMYCIN 250 MG PO TABS
ORAL_TABLET | ORAL | 0 refills | Status: AC
Start: 2023-05-16 — End: 2023-05-21

## 2023-05-16 NOTE — Progress Notes (Signed)
   HPI:Kimberly Best is a 89 y.o. female who presents for evaluation of cough. Patient cough started last Wednesday when she was out of town in [place]. The cough is productive of yellowish phlegm, reports being a little swimmy headed today. No fever. Some pain around her face and ears. Has started wheezing when she lays down, that started around Friday. Feels a little short of breath with exertion. Has been using OTC cough and cold meds without improvement. She is taking Singulair and nasal spray for allergies For the details of today's visit, please refer to the assessment and plan.  Physical Exam: Vitals:   05/16/23 1350  BP: (!) 147/72  Pulse: 88  Resp: 18  SpO2: 94%  Weight: 181 lb (82.1 kg)  Height: 4\' 8"  (1.422 m)     Physical Exam Constitutional:      General: She is not in acute distress.    Appearance: She is morbidly obese. She is not ill-appearing.  HENT:     Right Ear: No tenderness. A middle ear effusion is present. Tympanic membrane is not erythematous or bulging.     Left Ear: No tenderness. A middle ear effusion is present. Tympanic membrane is not erythematous or bulging.     Nose: No congestion or rhinorrhea.  Cardiovascular:     Rate and Rhythm: Normal rate and regular rhythm.     Heart sounds: No murmur heard. Pulmonary:     Effort: Pulmonary effort is normal. No respiratory distress.     Breath sounds: No wheezing, rhonchi or rales.      Assessment & Plan:   Kimberly Best was seen today for cough.  Acute bronchitis, unspecified organism Assessment & Plan: Acute problem not improving with treatment of her allergies. Exam demonstrates bilateral ear effusion.She has productive cough. Patient is having over a week of symptoms. Will prescribe antibiotic. Review of medication allergies show she is allergic to penicillin. Prescribed Azithromycin.  Recommend follow up if not improving and plan would be to send for chest xray.   Orders: -     Azithromycin; Take 2  tablets on day 1, then 1 tablet daily on days 2 through 5  Dispense: 6 tablet; Refill: 0      Kimberly Banister, MD

## 2023-05-16 NOTE — Patient Instructions (Signed)
Thank you, [name] for allowing [place] to provide your care today.    Acute bronchitis - azithromycin (ZITHROMAX) 250 MG tablet; Take 2 tablets on day 1, then 1 tablet daily on days 2 through 5  Dispense: 6 tablet; Refill: 0 - If not improving or worsening follow up with me and I will send you for a chest xray     Thurmon Fair, M.D.

## 2023-05-16 NOTE — Assessment & Plan Note (Addendum)
Acute problem not improving with treatment of her allergies. Exam demonstrates bilateral ear effusion.She has productive cough. Patient is having over a week of symptoms. Will prescribe antibiotic. Review of medication allergies show she is allergic to penicillin. Prescribed Azithromycin.  Recommend follow up if not improving and plan would be to send for chest xray.

## 2023-05-17 ENCOUNTER — Ambulatory Visit: Payer: Medicare HMO | Admitting: Family Medicine

## 2023-05-21 ENCOUNTER — Other Ambulatory Visit: Payer: Self-pay | Admitting: Family Medicine

## 2023-05-21 DIAGNOSIS — I10 Essential (primary) hypertension: Secondary | ICD-10-CM

## 2023-06-01 ENCOUNTER — Other Ambulatory Visit: Payer: Self-pay | Admitting: Family Medicine

## 2023-06-01 DIAGNOSIS — K219 Gastro-esophageal reflux disease without esophagitis: Secondary | ICD-10-CM

## 2023-06-25 ENCOUNTER — Ambulatory Visit: Payer: Medicare HMO | Admitting: Internal Medicine

## 2023-06-25 ENCOUNTER — Encounter: Payer: Self-pay | Admitting: Internal Medicine

## 2023-06-25 VITALS — BP 150/70 | HR 97 | Resp 17 | Ht <= 58 in | Wt 169.1 lb

## 2023-06-25 DIAGNOSIS — F4322 Adjustment disorder with anxiety: Secondary | ICD-10-CM | POA: Diagnosis not present
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
## 2023-06-25 NOTE — Patient Instructions (Signed)
Thank you for trusting me with your care. To recap, today we discussed the following:   I have place referral to integrated behavioral health for counseling Follow up in 4 weeks with your PCP.

## 2023-06-26 DIAGNOSIS — F4322 Adjustment disorder with anxiety: Secondary | ICD-10-CM | POA: Insufficient documentation

## 2023-06-26 NOTE — Assessment & Plan Note (Addendum)
Acute problem, uncomplicated No history of chronic depression or anxiety  Patient continues to have strong emotions 3 weeks after event. She has excess anxiety. Referral placed to [facility] to establish for counseling  Patient and/or legal guardian verbally consented to [facility] services about presenting concerns and psychiatric consultation as appropriate.  The services will be billed as appropriate for the patient

## 2023-06-27 ENCOUNTER — Ambulatory Visit: Payer: Medicare HMO | Admitting: Internal Medicine

## 2023-07-07 DIAGNOSIS — K5900 Constipation, unspecified: Secondary | ICD-10-CM | POA: Diagnosis not present

## 2023-07-07 DIAGNOSIS — J301 Allergic rhinitis due to pollen: Secondary | ICD-10-CM | POA: Diagnosis not present

## 2023-07-07 DIAGNOSIS — M1990 Unspecified osteoarthritis, unspecified site: Secondary | ICD-10-CM | POA: Diagnosis not present

## 2023-07-07 DIAGNOSIS — E039 Hypothyroidism, unspecified: Secondary | ICD-10-CM | POA: Diagnosis not present

## 2023-07-07 DIAGNOSIS — Z79899 Other long term (current) drug therapy: Secondary | ICD-10-CM | POA: Diagnosis not present

## 2023-07-07 DIAGNOSIS — E785 Hyperlipidemia, unspecified: Secondary | ICD-10-CM | POA: Diagnosis not present

## 2023-07-07 DIAGNOSIS — Z008 Encounter for other general examination: Secondary | ICD-10-CM | POA: Diagnosis not present

## 2023-07-07 DIAGNOSIS — K219 Gastro-esophageal reflux disease without esophagitis: Secondary | ICD-10-CM | POA: Diagnosis not present

## 2023-07-07 DIAGNOSIS — R42 Dizziness and giddiness: Secondary | ICD-10-CM | POA: Diagnosis not present

## 2023-07-07 DIAGNOSIS — I129 Hypertensive chronic kidney disease with stage 1 through stage 4 chronic kidney disease, or unspecified chronic kidney disease: Secondary | ICD-10-CM | POA: Diagnosis not present

## 2023-07-07 DIAGNOSIS — R32 Unspecified urinary incontinence: Secondary | ICD-10-CM | POA: Diagnosis not present

## 2023-07-07 DIAGNOSIS — N182 Chronic kidney disease, stage 2 (mild): Secondary | ICD-10-CM | POA: Diagnosis not present

## 2023-07-12 ENCOUNTER — Other Ambulatory Visit: Payer: Self-pay | Admitting: Family Medicine

## 2023-07-13 ENCOUNTER — Ambulatory Visit: Payer: Medicare HMO | Admitting: Obstetrics & Gynecology

## 2023-07-13 ENCOUNTER — Encounter: Payer: Self-pay | Admitting: Obstetrics & Gynecology

## 2023-07-13 VITALS — BP 153/72 | HR 74

## 2023-07-13 DIAGNOSIS — Z4689 Encounter for fitting and adjustment of other specified devices: Secondary | ICD-10-CM | POA: Diagnosis not present

## 2023-07-13 DIAGNOSIS — N813 Complete uterovaginal prolapse: Secondary | ICD-10-CM

## 2023-07-13 NOTE — Progress Notes (Signed)
Chief Complaint  Patient presents with   Pessary Check    Blood pressure (!) 153/72, pulse 74.  Kimberly Best presents today for routine follow up related to her pessary.   She uses a Milex ring with support #5 She reports no vaginal discharge and little vaginal bleeding   Likert scale(1 not bothersome -5 very bothersome)  :  1  Exam reveals no undue vaginal mucosal pressure of breakdown, no discharge and no vaginal bleeding.  Vaginal Epithelial Abnormality Classification System:   0 0    No abnormalities 1    Epithelial erythema 2    Granulation tissue 3    Epithelial break or erosion, 1 cm or less 4    Epithelial break or erosion, 1 cm or greater  The pessary is removed, cleaned and replaced without difficulty.      ICD-10-CM   1. Pessary maintenance, refit 10/18/20 with Milex ring with support #5  Z46.89     2. Uterovaginal prolapse, complete  N81.3        [name] will be sen back in 4 months for continued follow up.  Lazaro Arms, MD  07/13/2023 12:06 PM

## 2023-07-20 ENCOUNTER — Ambulatory Visit: Payer: Medicare HMO | Admitting: Obstetrics & Gynecology

## 2023-07-23 ENCOUNTER — Encounter: Payer: Self-pay | Admitting: Family Medicine

## 2023-07-23 ENCOUNTER — Ambulatory Visit (INDEPENDENT_AMBULATORY_CARE_PROVIDER_SITE_OTHER): Payer: Medicare HMO | Admitting: Family Medicine

## 2023-07-23 VITALS — BP 122/77 | HR 87 | Ht <= 58 in | Wt 181.1 lb

## 2023-07-23 DIAGNOSIS — E7849 Other hyperlipidemia: Secondary | ICD-10-CM | POA: Diagnosis not present

## 2023-07-23 DIAGNOSIS — E038 Other specified hypothyroidism: Secondary | ICD-10-CM

## 2023-07-23 DIAGNOSIS — G44209 Tension-type headache, unspecified, not intractable: Secondary | ICD-10-CM | POA: Diagnosis not present

## 2023-07-23 DIAGNOSIS — F439 Reaction to severe stress, unspecified: Secondary | ICD-10-CM

## 2023-07-23 DIAGNOSIS — Z638 Other specified problems related to primary support group: Secondary | ICD-10-CM

## 2023-07-23 DIAGNOSIS — E559 Vitamin D deficiency, unspecified: Secondary | ICD-10-CM

## 2023-07-23 DIAGNOSIS — R7301 Impaired fasting glucose: Secondary | ICD-10-CM | POA: Diagnosis not present

## 2023-07-23 NOTE — Progress Notes (Unsigned)
Established Patient Office Visit  Subjective:  Patient ID: Kimberly Best, female    DOB: 1933/11/27  Age: 89 y.o. MRN: [id number]  CC:  Chief Complaint  Patient presents with   Care Management    4 week f/u, been going through family stressors, would like to discuss something to help her calm down.     HPI Kimberly Best is a 89 y.o. female with past medical history of *** presents for f/u of *** chronic medical conditions.  Past Medical History:  Diagnosis Date   Allergy    Anxiety    Arthritis    Cancer (HCC)    right breast cancer   Diabetes mellitus without complication (HCC)    GERD (gastroesophageal reflux disease)    H. pylori infection 05/2020   S/p treatment with Pylera.  H. pylori stool antigen negative on 12/21/2020.   HLD (hyperlipidemia)    Hypertension    IDA (iron deficiency anemia)    Hg in 9-10 range since September 2019.   Pessary maintenance 03/10/2015    Past Surgical History:  Procedure Laterality Date   BIOPSY  05/26/2020   Procedure: BIOPSY;  Surgeon: Corbin Ade, MD;  Location: [facility];  Service: Endoscopy;;   BREAST SURGERY Right    lumpectomy    CHOLECYSTECTOMY     COLONOSCOPY N/A 05/26/2020   Procedure: COLONOSCOPY;  Surgeon: Corbin Ade, MD; Diverticulosis in sigmoid colon, friable colonic mucosa likely secondary to ASA/NSAID insult, exam was otherwise normal.     ESOPHAGOGASTRODUODENOSCOPY N/A 05/26/2020   Procedure: ESOPHAGOGASTRODUODENOSCOPY (EGD);  Surgeon: Corbin Ade, MD;  Normal esophagus s/p dilation, mucosal changes in the stomach biopsied, normal examined duodenum.  Pathology revealed H. pylori.   EYE SURGERY     GIVENS CAPSULE STUDY N/A 02/16/2021   Procedure: GIVENS CAPSULE STUDY;  Surgeon: Corbin Ade, MD;  gastric and duodenal bulbar erosions, 2 nonbleeding small bowel ulcers with benign appearance suspected to be secondary to chronic aspirin use.    Family History  Problem Relation Age of Onset    Cancer Father    Stroke Mother    Hypertension Mother    Diabetes Sister    Hypertension Sister    Cancer Brother    Hypertension Brother    Bipolar disorder Daughter    Hypertension Maternal Grandmother    Tuberculosis Paternal Grandmother    Hypertension Paternal Grandmother    Alcohol abuse Paternal Grandfather    Hypertension Paternal Grandfather    Colon cancer Neg Hx     Social History   Socioeconomic History   Marital status: Married    Spouse name: Not on file   Number of children: Not on file   Years of education: Not on file   Highest education level: Not on file  Occupational History   Not on file  Tobacco Use   Smoking status: Never   Smokeless tobacco: Never  Vaping Use   Vaping status: Never Used  Substance and Sexual Activity   Alcohol use: Not Currently    Comment: Couple ounces of champagne once every couple of weeks or less.   Drug use: No   Sexual activity: Not Currently    Birth control/protection: Post-menopausal, Abstinence  Other Topics Concern   Not on file  Social History Narrative   Widow since 2015,married for 56 years.Lives alone.Retired.   Social Determinants of Health   Financial Resource Strain: Low Risk  (09/05/2022)   Overall Financial Resource Strain (CARDIA)  Difficulty of Paying Living Expenses: Not hard at all  Food Insecurity: No Food Insecurity (09/05/2022)   Hunger Vital Sign    Worried About Running Out of Food in the Last Year: Never true    Ran Out of Food in the Last Year: Never true  Transportation Needs: No Transportation Needs (09/05/2022)   PRAPARE - Administrator, Civil Service (Medical): No    Lack of Transportation (Non-Medical): No  Physical Activity: Insufficiently Active (09/05/2022)   Exercise Vital Sign    Days of Exercise per Week: 3 days    Minutes of Exercise per Session: 30 min  Stress: No Stress Concern Present (09/05/2022)   [employer] of Occupational Health - Occupational Stress  Questionnaire    Feeling of Stress : Not at all  Social Connections: Unknown (09/05/2022)   Social Connection and Isolation Panel [NHANES]    Frequency of Communication with Friends and Family: More than three times a week    Frequency of Social Gatherings with Friends and Family: More than three times a week    Attends Religious Services: Never    Database administrator or Organizations: No    Attends Banker Meetings: Never    Marital Status: Patient declined  Catering manager Violence: Not At Risk (09/05/2022)   Humiliation, Afraid, Rape, and Kick questionnaire    Fear of Current or Ex-Partner: No    Emotionally Abused: No    Physically Abused: No    Sexually Abused: No    Outpatient Medications Prior to Visit  Medication Sig Dispense Refill   acetaminophen (TYLENOL) 650 MG CR tablet Take 650 mg by mouth 2 (two) times daily as needed for pain.     amLODipine (NORVASC) 2.5 MG tablet TAKE ONE TABLET (2.5MG  TOTAL) BY MOUTH DAILY 90 tablet 1   cholecalciferol (VITAMIN D) 1000 UNITS tablet Take 1,000 Units by mouth daily.     clotrimazole-betamethasone (LOTRISONE) cream For up to 14 days and then stop the medication. 45 g 0   empagliflozin (JARDIANCE) 25 MG TABS tablet Take 1 tablet (25 mg total) by mouth daily before breakfast. 30 tablet 3   fluticasone (FLONASE) 50 MCG/ACT nasal spray Place 2 sprays into both nostrils daily. 16 g 6   glimepiride (AMARYL) 4 MG tablet Take 1 tablet (4 mg total) by mouth daily before breakfast. 60 tablet 3   Lancets (ONETOUCH DELICA PLUS LANCET33G) MISC USE AS DIRECTED TO MONITOR BLOOD SUGAR TWICE DAILY. 100 each 0   meclizine (ANTIVERT) 25 MG tablet TAKE ONE TABLET (25MG  TOTAL) BY MOUTH THREE TIMES DAILY AS NEEDED FOR DIZZINESS 30 tablet 2   montelukast (SINGULAIR) 10 MG tablet TAKE ONE TABLET (10MG  TOTAL) BY MOUTH ATBEDTIME 30 tablet 3   Multiple Vitamins-Minerals (IMMUNE SUPPORT PO) Take 1 tablet by mouth daily.     ONETOUCH ULTRA test strip  USE AS DIRECTED TO MONITOR BLOOD SUGAR TWICE DAILY. 100 strip 0   pantoprazole (PROTONIX) 40 MG tablet TAKE ONE TABLET (40MG ) BY MOUTH DAILY 90 tablet 0   rosuvastatin (CRESTOR) 5 MG tablet TAKE ONE (1) TABLET BY MOUTH EVERY DAY 90 tablet 1   SLOW FE 142 (45 Fe) MG TBCR Take 2 tablets by mouth every morning. (Patient taking differently: Take 1 tablet by mouth every morning.) 180 tablet 1   No facility-administered medications prior to visit.    Allergies  Allergen Reactions   Lisinopril Other (See Comments)    Tongue swelling   Penicillins Swelling  Has patient had a PCN reaction causing immediate rash, facial/tongue/throat swelling, SOB or lightheadedness with hypotension: Yes Has patient had a PCN reaction causing severe rash involving mucus membranes or skin necrosis: No Has patient had a PCN reaction that required hospitalization: No Has patient had a PCN reaction occurring within the last 10 years: No If all of the above answers are "NO", then may proceed with Cephalosporin use.    ROS Review of Systems    Objective:    Physical Exam  BP 122/77   Pulse 87   Ht 4\' 8"  (1.422 m)   Wt 181 lb 1.9 oz (82.2 kg)   SpO2 95%   BMI 40.61 kg/m  Wt Readings from Last 3 Encounters:  07/23/23 181 lb 1.9 oz (82.2 kg)  06/25/23 169 lb 1.9 oz (76.7 kg)  05/16/23 181 lb (82.1 kg)    Lab Results  Component Value Date   TSH 1.110 12/18/2022   Lab Results  Component Value Date   WBC 4.4 12/18/2022   HGB 11.1 12/18/2022   HCT 32.7 (L) 12/18/2022   MCV 87 12/18/2022   PLT 236 12/18/2022   Lab Results  Component Value Date   NA 144 12/18/2022   K 4.2 12/18/2022   CO2 25 12/18/2022   GLUCOSE 95 12/18/2022   BUN 14 12/18/2022   CREATININE 0.65 12/18/2022   BILITOT 0.4 12/18/2022   ALKPHOS 58 12/18/2022   AST 17 12/18/2022   ALT 7 12/18/2022   PROT 7.3 12/18/2022   ALBUMIN 4.3 12/18/2022   CALCIUM 9.1 12/18/2022   ANIONGAP 9 07/18/2021   EGFR 85 12/18/2022   Lab  Results  Component Value Date   CHOL 141 12/18/2022   Lab Results  Component Value Date   HDL 53 12/18/2022   Lab Results  Component Value Date   LDLCALC 71 12/18/2022   Lab Results  Component Value Date   TRIG 91 12/18/2022   Lab Results  Component Value Date   CHOLHDL 2.7 12/18/2022   Lab Results  Component Value Date   HGBA1C 7.0 (H) 12/18/2022      Assessment & Plan:  There are no diagnoses linked to this encounter.  Follow-up: No follow-ups on file.   Gilmore Laroche, FNP

## 2023-07-23 NOTE — Patient Instructions (Addendum)
I appreciate the opportunity to provide care to you today!    Follow up:  3 months  Labs: please stop by the lab today during the week to get your blood drawn (CBC, CMP, TSH, Lipid profile, HgA1c, Vit D)  Please seek immediate medical attention at the Emergency Department if you experience any of the following symptoms in conjunction with your headaches:  Acute onset of a severe headache described as "the worst headache of my life," particularly in a patient with no prior history of headaches. Unrelenting headaches that do not improve with conservative treatments or pain that progressively worsens. Lancinating or "ice-pick" pain. Severe headaches accompanied by a stiff neck and/or fever. Headaches associated with changes in mental status or level of consciousness. Persistent headaches following trauma to the head or neck. A significant change in pattern or severity of headaches in a patient with a longstanding history of chronic headaches. Your prompt evaluation is crucial for appropriate diagnosis and management.  Managing tension headaches nonpharmacologically can be highly effective and involves a combination of lifestyle adjustments, physical therapies, and stress management techniques. Here are several strategies to help alleviate tension headaches:  Stress Management: -Relaxation Techniques: Practice deep breathing exercises, progressive muscle relaxation, or guided imagery to reduce stress and muscle tension. -Mindfulness and Meditation: Engage in mindfulness meditation to help manage stress and increase awareness of physical and emotional tension. Exercise: -Regular Exercise: Engage in regular physical activity such as walking, swimming, or yoga to improve overall fitness, reduce stress, and alleviate muscle tension. -Stretching Exercises: Perform stretching exercises for the neck, shoulders, and upper back to relieve muscle tightness and improve posture. Posture: Maintain proper  posture while sitting and standing. Use a chair with adequate lumbar support and avoid slouching. Heat and Cold Therapy: Warm Compress: Apply a warm compress or heating pad to the neck and shoulder area to relax tense muscles and improve blood flow. Cold Compress: For some individuals, applying a cold pack to the forehead or the back of the neck can help alleviate headache pain. Massage Therapy: Self-Massage: Gently massage the neck, shoulders, and scalp to relieve muscle tension and reduce headache symptoms. Professional Massage: Consider receiving regular massages from a licensed therapist to address chronic muscle tension and stress. Hydration and Nutrition: -Adequate Hydration: Drink plenty of water throughout the day to prevent dehydration, which can contribute to headaches. Balanced Diet: Maintain a healthy, balanced diet and avoid skipping meals. Certain foods and beverages, such as those high in sugar or caffeine, can sometimes trigger headaches. Sleep Hygiene: -Consistent Sleep Schedule: Establish a regular sleep routine by going to bed and waking up at the same time each day. Aim for 7-9 hours of quality sleep per night. -Sleep Environment: Create a restful sleep environment by keeping your bedroom cool, dark, and quiet. Limit Caffeine and Alcohol: -Moderation: Reduce or eliminate excessive caffeine and alcohol intake, as they can sometimes exacerbate tension headaches.   Referrals today- Florencia Reasons for talk therapy   Please continue to a heart-healthy diet and increase your physical activities. Try to exercise for at least five days a week.    It was a pleasure to see you and I look forward to continuing to work together on your health and well-being. Please do not hesitate to call the office if you need care or have questions about your care.  In case of emergency, please visit the Emergency Department for urgent care, or contact our clinic at [phone] to schedule an  appointment. We're here to  help you!   Have a wonderful day and week. With Gratitude, Gilmore Laroche MSN, FNP-BC

## 2023-07-24 DIAGNOSIS — E7849 Other hyperlipidemia: Secondary | ICD-10-CM | POA: Diagnosis not present

## 2023-07-24 DIAGNOSIS — E559 Vitamin D deficiency, unspecified: Secondary | ICD-10-CM | POA: Diagnosis not present

## 2023-07-24 DIAGNOSIS — Z638 Other specified problems related to primary support group: Secondary | ICD-10-CM | POA: Insufficient documentation

## 2023-07-24 DIAGNOSIS — E038 Other specified hypothyroidism: Secondary | ICD-10-CM | POA: Diagnosis not present

## 2023-07-24 DIAGNOSIS — R7301 Impaired fasting glucose: Secondary | ICD-10-CM | POA: Diagnosis not present

## 2023-07-24 DIAGNOSIS — G44209 Tension-type headache, unspecified, not intractable: Secondary | ICD-10-CM | POA: Insufficient documentation

## 2023-07-24 NOTE — Assessment & Plan Note (Signed)
Complains of occasional headaches that radiate to her neck and shoulder Headache is described as a tight band It is relieved by Tylenol No red flag symptoms reported Encouraged to seek immediate medical attention at the Emergency Department if you experience any of the following symptoms in conjunction with your headaches:  Acute onset of a severe headache described as "the worst headache of my life," particularly in a patient with no prior history of headaches. Unrelenting headaches that do not improve with conservative treatments or pain that progressively worsens. Lancinating or "ice-pick" pain. Severe headaches accompanied by a stiff neck and/or fever. Headaches associated with changes in mental status or level of consciousness. Persistent headaches following trauma to the head or neck. A significant change in pattern or severity of headaches in a patient with a longstanding history of chronic headaches. Your prompt evaluation is crucial for appropriate diagnosis and management.  Reviewed nonpharmacological ways to alleviate tension headache Stress Management: -Relaxation Techniques: Practice deep breathing exercises, progressive muscle relaxation, or guided imagery to reduce stress and muscle tension. -Mindfulness and Meditation: Engage in mindfulness meditation to help manage stress and increase awareness of physical and emotional tension. Exercise: -Regular Exercise: Engage in regular physical activity such as walking, swimming, or yoga to improve overall fitness, reduce stress, and alleviate muscle tension. -Stretching Exercises: Perform stretching exercises for the neck, shoulders, and upper back to relieve muscle tightness and improve posture. Posture: Maintain proper posture while sitting and standing. Use a chair with adequate lumbar support and avoid slouching. Heat and Cold Therapy: Warm Compress: Apply a warm compress or heating pad to the neck and shoulder area to relax tense  muscles and improve blood flow. Cold Compress: For some individuals, applying a cold pack to the forehead or the back of the neck can help alleviate headache pain. Massage Therapy: Self-Massage: Gently massage the neck, shoulders, and scalp to relieve muscle tension and reduce headache symptoms. Professional Massage: Consider receiving regular massages from a licensed therapist to address chronic muscle tension and stress. Hydration and Nutrition: -Adequate Hydration: Drink plenty of water throughout the day to prevent dehydration, which can contribute to headaches. Balanced Diet: Maintain a healthy, balanced diet and avoid skipping meals. Certain foods and beverages, such as those high in sugar or caffeine, can sometimes trigger headaches. Sleep Hygiene: -Consistent Sleep Schedule: Establish a regular sleep routine by going to bed and waking up at the same time each day. Aim for 7-9 hours of quality sleep per night. -Sleep Environment: Create a restful sleep environment by keeping your bedroom cool, dark, and quiet. Limit Caffeine and Alcohol: -Moderation: Reduce or eliminate excessive caffeine and alcohol intake, as they can sometimes exacerbate tension headaches.

## 2023-07-24 NOTE — Assessment & Plan Note (Addendum)
The patient is tearful as she discusses her current family situation She reports that she is under immense stress Denies suicidal thoughts and ideation Review nonpharmacological ways to relieve stress Stress Management: -Relaxation Techniques: Practice deep breathing exercises, progressive muscle relaxation, or guided imagery to reduce stress and muscle tension. -Mindfulness and Meditation: Engage in mindfulness meditation to help manage stress and increase awareness of physical and emotional tension. Referral placed to [facility] for talk therapy

## 2023-07-30 NOTE — Progress Notes (Signed)
  Please inform the patient that her hemoglobin A1c has decreased from 7.0 to 6.8. This is a positive improvement, so I encourage her to continue with her current treatment regimen. All other lab results remain stable. Keep up the good work.

## 2023-08-16 ENCOUNTER — Ambulatory Visit: Payer: Medicare HMO | Admitting: Internal Medicine

## 2023-08-16 ENCOUNTER — Encounter: Payer: Self-pay | Admitting: Internal Medicine

## 2023-08-16 VITALS — BP 138/74 | HR 93 | Ht <= 58 in | Wt 183.8 lb

## 2023-08-16 DIAGNOSIS — R42 Dizziness and giddiness: Secondary | ICD-10-CM | POA: Diagnosis not present

## 2023-08-16 DIAGNOSIS — J0101 Acute recurrent maxillary sinusitis: Secondary | ICD-10-CM

## 2023-08-16 DIAGNOSIS — H1013 Acute atopic conjunctivitis, bilateral: Secondary | ICD-10-CM | POA: Diagnosis not present

## 2023-08-16 DIAGNOSIS — I10 Essential (primary) hypertension: Secondary | ICD-10-CM | POA: Diagnosis not present

## 2023-08-16 DIAGNOSIS — J0100 Acute maxillary sinusitis, unspecified: Secondary | ICD-10-CM | POA: Insufficient documentation

## 2023-08-16 MED ORDER — TRIAMCINOLONE ACETONIDE 55 MCG/ACT NA AERO
2.0000 | INHALATION_SPRAY | Freq: Every day | NASAL | 12 refills | Status: DC
Start: 2023-08-16 — End: 2024-10-09

## 2023-08-16 MED ORDER — AZITHROMYCIN 250 MG PO TABS
ORAL_TABLET | ORAL | 0 refills | Status: AC
Start: 2023-08-16 — End: 2023-08-21

## 2023-08-16 NOTE — Assessment & Plan Note (Signed)
Started empiric azithromycin as she has recent worsening of sinus symptoms despite symptomatic treatment Switch to Nasacort as she has burning with Flonase Continue nasal saline spray as needed for nasal congestion Can use humidifier and/or vaporizer Continue Singulair for allergies

## 2023-08-16 NOTE — Progress Notes (Signed)
Acute Office Visit  Subjective:    Patient ID: Kimberly Best, female    DOB: Nov 08, 1933, 89 y.o.   MRN: [id number]  Chief Complaint  Patient presents with   Dizziness    Patient feels dizzy and off balance   Sinusitis    Patient thinks she has a sinus infection , she has taken nasal saline and not getting any relief     HPI Patient is in today for complaint of nasal congestion, postnasal drip and sinus pressure related headache and facial pain for the last 1 week.  She has his history of allergy rhinosinusitis and uses Flonase and takes Singulair for it.  She reports burning in nostril due to Flonase.  She has also used nasal saline spray.  Denies any fever, chills, dyspnea or wheezing currently.  She also reports chronic dizziness, worse upon standing and head movement.  She has history of vertigo, and has had the vestibular rehab and ENT evaluation with Dr. Suszanne Conners.  She takes meclizine on a regular basis for vertigo.  Denies any ear pain or discharge currently.  Past Medical History:  Diagnosis Date   Allergy    Anxiety    Arthritis    Cancer (HCC)    right breast cancer   Diabetes mellitus without complication (HCC)    GERD (gastroesophageal reflux disease)    H. pylori infection 05/2020   S/p treatment with Pylera.  H. pylori stool antigen negative on 12/21/2020.   HLD (hyperlipidemia)    Hypertension    IDA (iron deficiency anemia)    Hg in 9-10 range since September 2019.   Pessary maintenance 03/10/2015    Past Surgical History:  Procedure Laterality Date   BIOPSY  05/26/2020   Procedure: BIOPSY;  Surgeon: Corbin Ade, MD;  Location: [facility];  Service: Endoscopy;;   BREAST SURGERY Right    lumpectomy    CHOLECYSTECTOMY     COLONOSCOPY N/A 05/26/2020   Procedure: COLONOSCOPY;  Surgeon: Corbin Ade, MD; Diverticulosis in sigmoid colon, friable colonic mucosa likely secondary to ASA/NSAID insult, exam was otherwise normal.     ESOPHAGOGASTRODUODENOSCOPY  N/A 05/26/2020   Procedure: ESOPHAGOGASTRODUODENOSCOPY (EGD);  Surgeon: Corbin Ade, MD;  Normal esophagus s/p dilation, mucosal changes in the stomach biopsied, normal examined duodenum.  Pathology revealed H. pylori.   EYE SURGERY     GIVENS CAPSULE STUDY N/A 02/16/2021   Procedure: GIVENS CAPSULE STUDY;  Surgeon: Corbin Ade, MD;  gastric and duodenal bulbar erosions, 2 nonbleeding small bowel ulcers with benign appearance suspected to be secondary to chronic aspirin use.    Family History  Problem Relation Age of Onset   Cancer Father    Stroke Mother    Hypertension Mother    Diabetes Sister    Hypertension Sister    Cancer Brother    Hypertension Brother    Bipolar disorder Daughter    Hypertension Maternal Grandmother    Tuberculosis Paternal Grandmother    Hypertension Paternal Grandmother    Alcohol abuse Paternal Grandfather    Hypertension Paternal Grandfather    Colon cancer Neg Hx     Social History   Socioeconomic History   Marital status: Married    Spouse name: Not on file   Number of children: Not on file   Years of education: Not on file   Highest education level: Not on file  Occupational History   Not on file  Tobacco Use   Smoking status: Never  Smokeless tobacco: Never  Vaping Use   Vaping status: Never Used  Substance and Sexual Activity   Alcohol use: Not Currently    Comment: Couple ounces of champagne once every couple of weeks or less.   Drug use: No   Sexual activity: Not Currently    Birth control/protection: Post-menopausal, Abstinence  Other Topics Concern   Not on file  Social History Narrative   Widow since 2015,married for 56 years.Lives alone.Retired.   Social Determinants of Health   Financial Resource Strain: Low Risk  (09/05/2022)   Overall Financial Resource Strain (CARDIA)    Difficulty of Paying Living Expenses: Not hard at all  Food Insecurity: No Food Insecurity (09/05/2022)   Hunger Vital Sign    Worried  About Running Out of Food in the Last Year: Never true    Ran Out of Food in the Last Year: Never true  Transportation Needs: No Transportation Needs (09/05/2022)   PRAPARE - Administrator, Civil Service (Medical): No    Lack of Transportation (Non-Medical): No  Physical Activity: Insufficiently Active (09/05/2022)   Exercise Vital Sign    Days of Exercise per Week: 3 days    Minutes of Exercise per Session: 30 min  Stress: No Stress Concern Present (09/05/2022)   [employer] of Occupational Health - Occupational Stress Questionnaire    Feeling of Stress : Not at all  Social Connections: Unknown (09/05/2022)   Social Connection and Isolation Panel [NHANES]    Frequency of Communication with Friends and Family: More than three times a week    Frequency of Social Gatherings with Friends and Family: More than three times a week    Attends Religious Services: Never    Database administrator or Organizations: No    Attends Banker Meetings: Never    Marital Status: Patient declined  Catering manager Violence: Not At Risk (09/05/2022)   Humiliation, Afraid, Rape, and Kick questionnaire    Fear of Current or Ex-Partner: No    Emotionally Abused: No    Physically Abused: No    Sexually Abused: No    Outpatient Medications Prior to Visit  Medication Sig Dispense Refill   acetaminophen (TYLENOL) 650 MG CR tablet Take 650 mg by mouth 2 (two) times daily as needed for pain.     amLODipine (NORVASC) 2.5 MG tablet TAKE ONE TABLET (2.5MG  TOTAL) BY MOUTH DAILY 90 tablet 1   cholecalciferol (VITAMIN D) 1000 UNITS tablet Take 1,000 Units by mouth daily.     clotrimazole-betamethasone (LOTRISONE) cream For up to 14 days and then stop the medication. 45 g 0   empagliflozin (JARDIANCE) 25 MG TABS tablet Take 1 tablet (25 mg total) by mouth daily before breakfast. 30 tablet 3   glimepiride (AMARYL) 4 MG tablet Take 1 tablet (4 mg total) by mouth daily before breakfast. 60  tablet 3   Lancets (ONETOUCH DELICA PLUS LANCET33G) MISC USE AS DIRECTED TO MONITOR BLOOD SUGAR TWICE DAILY. 100 each 0   meclizine (ANTIVERT) 25 MG tablet TAKE ONE TABLET (25MG  TOTAL) BY MOUTH THREE TIMES DAILY AS NEEDED FOR DIZZINESS 30 tablet 2   montelukast (SINGULAIR) 10 MG tablet TAKE ONE TABLET (10MG  TOTAL) BY MOUTH ATBEDTIME 30 tablet 3   Multiple Vitamins-Minerals (IMMUNE SUPPORT PO) Take 1 tablet by mouth daily.     ONETOUCH ULTRA test strip USE AS DIRECTED TO MONITOR BLOOD SUGAR TWICE DAILY. 100 strip 0   pantoprazole (PROTONIX) 40 MG tablet TAKE ONE TABLET (40MG )  BY MOUTH DAILY 90 tablet 0   rosuvastatin (CRESTOR) 5 MG tablet TAKE ONE (1) TABLET BY MOUTH EVERY DAY 90 tablet 1   SLOW FE 142 (45 Fe) MG TBCR Take 2 tablets by mouth every morning. (Patient taking differently: Take 1 tablet by mouth every morning.) 180 tablet 1   fluticasone (FLONASE) 50 MCG/ACT nasal spray Place 2 sprays into both nostrils daily. 16 g 6   No facility-administered medications prior to visit.    Allergies  Allergen Reactions   Lisinopril Other (See Comments)    Tongue swelling   Penicillins Swelling    Has patient had a PCN reaction causing immediate rash, facial/tongue/throat swelling, SOB or lightheadedness with hypotension: Yes Has patient had a PCN reaction causing severe rash involving mucus membranes or skin necrosis: No Has patient had a PCN reaction that required hospitalization: No Has patient had a PCN reaction occurring within the last 10 years: No If all of the above answers are "NO", then may proceed with Cephalosporin use.    Review of Systems  Constitutional:  Negative for chills and fever.  HENT:  Positive for congestion, postnasal drip, sinus pressure, sinus pain and sore throat.   Eyes:  Positive for discharge and redness. Negative for pain.  Respiratory:  Negative for cough and shortness of breath.   Cardiovascular:  Negative for chest pain and palpitations.  Gastrointestinal:   Negative for abdominal pain, diarrhea, nausea and vomiting.  Endocrine: Negative for polydipsia and polyuria.  Genitourinary:  Negative for dysuria and hematuria.  Musculoskeletal:  Negative for neck pain and neck stiffness.  Skin:  Negative for rash.  Neurological:  Positive for dizziness. Negative for weakness.  Psychiatric/Behavioral:  Negative for agitation and behavioral problems.        Objective:    Physical Exam Vitals reviewed.  Constitutional:      General: She is not in acute distress.    Appearance: She is not diaphoretic.  HENT:     Head: Normocephalic and atraumatic.     Nose: Congestion present.     Right Sinus: Maxillary sinus tenderness present.     Left Sinus: Maxillary sinus tenderness present.     Mouth/Throat:     Pharynx: Posterior oropharyngeal erythema present.  Eyes:     General: No scleral icterus.    Extraocular Movements: Extraocular movements intact.     Pupils: Pupils are equal, round, and reactive to light.  Cardiovascular:     Rate and Rhythm: Normal rate and regular rhythm.     Pulses: Normal pulses.     Heart sounds: No murmur heard. Pulmonary:     Breath sounds: Normal breath sounds. No wheezing or rales.  Abdominal:     Palpations: Abdomen is soft.     Tenderness: There is no abdominal tenderness.  Musculoskeletal:     Cervical back: Neck supple. No tenderness.     Right lower leg: No edema.     Left lower leg: No edema.  Skin:    General: Skin is warm.     Findings: No rash.  Neurological:     General: No focal deficit present.     Mental Status: She is alert and oriented to person, place, and time.  Psychiatric:        Mood and Affect: Mood normal.        Behavior: Behavior normal.     BP 138/74 (BP Location: Left Arm, Patient Position: Sitting, Cuff Size: Large)   Pulse 93   Ht 4'  8" (1.422 m)   Wt 183 lb 12.8 oz (83.4 kg)   SpO2 92%   BMI 41.21 kg/m  Wt Readings from Last 3 Encounters:  08/16/23 183 lb 12.8 oz (83.4  kg)  07/23/23 181 lb 1.9 oz (82.2 kg)  06/25/23 169 lb 1.9 oz (76.7 kg)        Assessment & Plan:   Problem List Items Addressed This Visit       Cardiovascular and Mediastinum   Essential hypertension    BP Readings from Last 1 Encounters:  08/16/23 138/74   Well-controlled with amlodipine Counseled for compliance with the medications Advised DASH diet and ambulate as tolerated         Respiratory   Acute recurrent maxillary sinusitis - Primary    Started empiric azithromycin as she has recent worsening of sinus symptoms despite symptomatic treatment Switch to Nasacort as she has burning with Flonase Continue nasal saline spray as needed for nasal congestion Can use humidifier and/or vaporizer Continue Singulair for allergies      Relevant Medications   azithromycin (ZITHROMAX) 250 MG tablet   triamcinolone (NASACORT) 55 MCG/ACT AERO nasal inhaler     Other   Vertigo    Chronic dizziness - has had ENT evaluation and vestibular therapy Meclizine as needed instead of every day Avoid sudden positional changes Maintain adequate hydration and eat at regular intervals      Allergic conjunctivitis of both eyes    She has recurrent redness and clear discharge from both eyes - likely allergic conjunctivitis, advised to try Pataday eyedrops Advised to follow up with ophthalmology -[facility]        Meds ordered this encounter  Medications   azithromycin (ZITHROMAX) 250 MG tablet    Sig: Take 2 tablets on day 1, then 1 tablet daily on days 2 through 5    Dispense:  6 tablet    Refill:  0   triamcinolone (NASACORT) 55 MCG/ACT AERO nasal inhaler    Sig: Place 2 sprays into the nose daily.    Dispense:  1 each    Refill:  12     Anabel Halon, MD

## 2023-08-16 NOTE — Assessment & Plan Note (Signed)
Chronic dizziness - has had ENT evaluation and vestibular therapy Meclizine as needed instead of every day Avoid sudden positional changes Maintain adequate hydration and eat at regular intervals

## 2023-08-16 NOTE — Assessment & Plan Note (Signed)
She has recurrent redness and clear discharge from both eyes - likely allergic conjunctivitis, advised to try Pataday eyedrops Advised to follow up with ophthalmology -[facility]

## 2023-08-16 NOTE — Patient Instructions (Signed)
Please start taking Azithromycin as prescribed.  Please start using Nasacort instead of Flonase for nasal congestion.  Okay to use Pataday eye drop for allergies of eyes.

## 2023-08-16 NOTE — Assessment & Plan Note (Signed)
BP Readings from Last 1 Encounters:  08/16/23 138/74   Well-controlled with amlodipine Counseled for compliance with the medications Advised DASH diet and ambulate as tolerated

## 2023-08-30 ENCOUNTER — Ambulatory Visit: Payer: Medicare HMO | Admitting: Professional Counselor

## 2023-08-30 DIAGNOSIS — F330 Major depressive disorder, recurrent, mild: Secondary | ICD-10-CM
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
## 2023-09-05 ENCOUNTER — Other Ambulatory Visit: Payer: Self-pay | Admitting: Family Medicine

## 2023-09-05 DIAGNOSIS — E118 Type 2 diabetes mellitus with unspecified complications: Secondary | ICD-10-CM

## 2023-09-05 DIAGNOSIS — K219 Gastro-esophageal reflux disease without esophagitis: Secondary | ICD-10-CM

## 2023-09-06 ENCOUNTER — Ambulatory Visit: Payer: Medicare HMO | Admitting: Professional Counselor

## 2023-09-06 DIAGNOSIS — F330 Major depressive disorder, recurrent, mild: Secondary | ICD-10-CM | POA: Diagnosis not present

## 2023-09-06 NOTE — BH Specialist Note (Addendum)
[facility] Telephone Follow-up  MRN: [id number] NAME: Kimberly Best Date: 09/12/23  Start time: Start Time: 1000 End time: Stop Time: 1030 Total time: Total Time in Minutes (Visit): 30 Call number: Visit Number: 2- Second Visit  Reason for call today:  The patient is an 89 year old female returning for a collaborative care follow-up. She reports a slight overall improvement in mood and a decrease in symptoms of excessive worrying and stress. Her primary concern remains her ongoing conflict with her daughter, who struggles with addiction. The patient often feels overwhelmed by constant worrying and frequent disagreements with her daughter as she tries to help and control her decisions, leading to feelings of resentment when outcomes are not as expected.  The patient found our last session beneficial and has been working on detaching more from her daughter's situation and focusing on self-care, which has provided her with some relief. During today's session, we discussed codependency and explored strategies for family members dealing with a loved one's addiction. She appreciated gaining a better understanding of the limited control she has over her daughter's choices. The patient will continue to work on her goals of focusing on self-care and managing her relationship with her daughter in a healthier way. A follow-up appointment is scheduled for one week.    PHQ-9 Scores:     09/06/2023   10:03 AM 08/30/2023    9:13 AM 08/16/2023    4:09 PM 07/23/2023    2:43 PM 06/25/2023    2:33 PM  Depression screen PHQ 2/9  Decreased Interest 0 2 0 2 3  Down, Depressed, Hopeless 0 1 1 1 3   PHQ - 2 Score 0 3 1 3 6   Altered sleeping 0 2 1 1 3   Tired, decreased energy 0 1 2 1 2   Change in appetite 1 3 1 2 3   Feeling bad or failure about yourself  1 1 1 1 1   Trouble concentrating 1 0 1 1 2   Moving slowly or fidgety/restless 3 3 1 1 1   Suicidal thoughts 0 0 0 0 0  PHQ-9 Score 6 13 8 10 18    Difficult doing work/chores Somewhat difficult Somewhat difficult Somewhat difficult Somewhat difficult Very difficult   GAD-7 Scores:     09/06/2023   10:04 AM 08/30/2023    9:16 AM 08/16/2023    4:09 PM 07/23/2023    2:43 PM  GAD 7 : Generalized Anxiety Score  Nervous, Anxious, on Edge 0 3 1 1   Control/stop worrying 0 3 1 1   Worry too much - different things 1 3 1 1   Trouble relaxing 1 1 1 1   Restless 0 1 1 1   Easily annoyed or irritable 0 3 1 1   Afraid - awful might happen 1 1 1 1   Total GAD 7 Score 3 15 7 7   Anxiety Difficulty Somewhat difficult Somewhat difficult Not difficult at all Not difficult at all    Stress Current stressors:  Daughter  Sleep:  Improved Appetite:  Good Coping ability:  Good Patient taking medications as prescribed:  na  Current medications:  Outpatient Encounter Medications as of 09/06/2023  Medication Sig   acetaminophen (TYLENOL) 650 MG CR tablet Take 650 mg by mouth 2 (two) times daily as needed for pain.   amLODipine (NORVASC) 2.5 MG tablet TAKE ONE TABLET (2.5MG  TOTAL) BY MOUTH DAILY   cholecalciferol (VITAMIN D) 1000 UNITS tablet Take 1,000 Units by mouth daily.   clotrimazole-betamethasone (LOTRISONE) cream For up to 14 days and  then stop the medication.   empagliflozin (JARDIANCE) 25 MG TABS tablet Take 1 tablet (25 mg total) by mouth daily before breakfast.   glimepiride (AMARYL) 4 MG tablet TAKE ONE TABLET (4 MG TOTAL) BY MOUTH DAILY, BEFORE BREAKFAST.   Lancets (ONETOUCH DELICA PLUS LANCET33G) MISC USE AS DIRECTED TO MONITOR BLOOD SUGAR TWICE DAILY.   meclizine (ANTIVERT) 25 MG tablet TAKE ONE TABLET (25MG  TOTAL) BY MOUTH THREE TIMES DAILY AS NEEDED FOR DIZZINESS   montelukast (SINGULAIR) 10 MG tablet TAKE ONE TABLET (10MG  TOTAL) BY MOUTH ATBEDTIME   Multiple Vitamins-Minerals (IMMUNE SUPPORT PO) Take 1 tablet by mouth daily.   ONETOUCH ULTRA test strip USE AS DIRECTED TO MONITOR BLOOD SUGAR TWICE DAILY.   pantoprazole (PROTONIX) 40 MG  tablet TAKE ONE TABLET (40MG ) BY MOUTH DAILY   rosuvastatin (CRESTOR) 5 MG tablet TAKE ONE (1) TABLET BY MOUTH EVERY DAY   SLOW FE 142 (45 Fe) MG TBCR Take 2 tablets by mouth every morning. (Patient taking differently: Take 1 tablet by mouth every morning.)   triamcinolone (NASACORT) 55 MCG/ACT AERO nasal inhaler Place 2 sprays into the nose daily.   No facility-administered encounter medications on file as of 09/06/2023.     Self-harm Behaviors Risk Assessment Self-harm risk factors:  None Patient endorses recent thoughts of harming self:  Denies   Danger to Others Risk Assessment Danger to others risk factors:  None Patient endorses recent thoughts of harming others: Denies    Goals, Interventions and Follow-up Plan Goals: Increase healthy adjustment to current life circumstances Interventions: Mindfulness or Relaxation Training and CBT Cognitive Behavioral Therapy Follow-up Plan:  One week follow up    Reuel Boom

## 2023-09-12 ENCOUNTER — Telehealth (INDEPENDENT_AMBULATORY_CARE_PROVIDER_SITE_OTHER): Payer: Self-pay | Admitting: Professional Counselor

## 2023-09-12 DIAGNOSIS — F330 Major depressive disorder, recurrent, mild: Secondary | ICD-10-CM
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
## 2023-09-21 ENCOUNTER — Ambulatory Visit: Payer: Medicare HMO | Admitting: Professional Counselor

## 2023-09-21 DIAGNOSIS — F330 Major depressive disorder, recurrent, mild: Secondary | ICD-10-CM

## 2023-09-21 NOTE — BH Specialist Note (Signed)
[facility] Telephone Follow-up  MRN: [id number] NAME: Kimberly Best Date: 09/21/23  Start time: Start Time: 0130 End time: Stop Time: 0200 Total time: Total Time in Minutes (Visit): 30 Call number: Visit Number: 4- Fourth Visit  Reason for call today:  The patient is an 89 year old female returning for a clinical care follow-up. She presents with a positive mood and reports a noticeable decrease in her symptoms. This improvement is supported by a reduction in both her PHQ-9 and GAD-7 scores. The patient shares that she has been more active lately, attending a banquet with friends and a birthday gathering, which allowed her to connect and socialize. She continues to remain active in her church and is looking forward to the arrival of two new grandbabies in November, feeling optimistic about this upcoming event.  The patient has also been working on letting go of worry and control, setting firmer boundaries with her daughter, which she reports has helped her emotionally. While she still feels some concern and remains distant from her daughter, she believes that her stress levels have decreased by not engaging as frequently. She acknowledges that although she wishes she could do more, the detachment has been beneficial for her well-being. The patient reports good sleep and appetite and feels that she is coping effectively overall. A follow-up appointment is scheduled in two weeks to continue monitoring her progress.  PHQ-9 Scores:     09/21/2023    1:42 PM 09/06/2023   10:03 AM 08/30/2023    9:13 AM 08/16/2023    4:09 PM 07/23/2023    2:43 PM  Depression screen PHQ 2/9  Decreased Interest 0 0 2 0 2  Down, Depressed, Hopeless 0 0 1 1 1   PHQ - 2 Score 0 0 3 1 3   Altered sleeping 0 0 2 1 1   Tired, decreased energy 0 0 1 2 1   Change in appetite 2 1 3 1 2   Feeling bad or failure about yourself  0 1 1 1 1   Trouble concentrating  1 0 1 1  Moving slowly or fidgety/restless 1 3 3 1 1    Suicidal thoughts 0 0 0 0 0  PHQ-9 Score 3 6 13 8 10   Difficult doing work/chores Somewhat difficult Somewhat difficult Somewhat difficult Somewhat difficult Somewhat difficult   GAD-7 Scores:     09/21/2023    1:43 PM 09/06/2023   10:04 AM 08/30/2023    9:16 AM 08/16/2023    4:09 PM  GAD 7 : Generalized Anxiety Score  Nervous, Anxious, on Edge 0 0 3 1  Control/stop worrying 0 0 3 1  Worry too much - different things 1 1 3 1   Trouble relaxing 0 1 1 1   Restless 0 0 1 1  Easily annoyed or irritable 0 0 3 1  Afraid - awful might happen 1 1 1 1   Total GAD 7 Score 2 3 15 7   Anxiety Difficulty Somewhat difficult Somewhat difficult Somewhat difficult Not difficult at all    Stress Current stressors:  Relationship with daughter Sleep:  Good Appetite:  Good Coping ability:  Good Patient taking medications as prescribed:  Yes  Current medications:  Outpatient Encounter Medications as of 09/21/2023  Medication Sig   acetaminophen (TYLENOL) 650 MG CR tablet Take 650 mg by mouth 2 (two) times daily as needed for pain.   amLODipine (NORVASC) 2.5 MG tablet TAKE ONE TABLET (2.5MG  TOTAL) BY MOUTH DAILY   cholecalciferol (VITAMIN D) 1000 UNITS tablet Take 1,000 Units  by mouth daily.   clotrimazole-betamethasone (LOTRISONE) cream For up to 14 days and then stop the medication.   empagliflozin (JARDIANCE) 25 MG TABS tablet Take 1 tablet (25 mg total) by mouth daily before breakfast.   glimepiride (AMARYL) 4 MG tablet TAKE ONE TABLET (4 MG TOTAL) BY MOUTH DAILY, BEFORE BREAKFAST.   Lancets (ONETOUCH DELICA PLUS LANCET33G) MISC USE AS DIRECTED TO MONITOR BLOOD SUGAR TWICE DAILY.   meclizine (ANTIVERT) 25 MG tablet TAKE ONE TABLET (25MG  TOTAL) BY MOUTH THREE TIMES DAILY AS NEEDED FOR DIZZINESS   montelukast (SINGULAIR) 10 MG tablet TAKE ONE TABLET (10MG  TOTAL) BY MOUTH ATBEDTIME   Multiple Vitamins-Minerals (IMMUNE SUPPORT PO) Take 1 tablet by mouth daily.   ONETOUCH ULTRA test strip USE AS DIRECTED  TO MONITOR BLOOD SUGAR TWICE DAILY.   pantoprazole (PROTONIX) 40 MG tablet TAKE ONE TABLET (40MG ) BY MOUTH DAILY   rosuvastatin (CRESTOR) 5 MG tablet TAKE ONE (1) TABLET BY MOUTH EVERY DAY   SLOW FE 142 (45 Fe) MG TBCR Take 2 tablets by mouth every morning. (Patient taking differently: Take 1 tablet by mouth every morning.)   triamcinolone (NASACORT) 55 MCG/ACT AERO nasal inhaler Place 2 sprays into the nose daily.   No facility-administered encounter medications on file as of 09/21/2023.     Self-harm Behaviors Risk Assessment Self-harm risk factors:  Aging, depression Patient endorses recent thoughts of harming self:  Denies   Danger to Others Risk Assessment Danger to others risk factors:  None Patient endorses recent thoughts of harming others:  Denies   Goals, Interventions and Follow-up Plan Goals:  Continue with current goals Interventions: Mindfulness or Relaxation Training and CBT Cognitive Behavioral Therapy Follow-up Plan:  2 weeks   Reuel Boom

## 2023-09-25 ENCOUNTER — Telehealth (HOSPITAL_COMMUNITY): Payer: Self-pay

## 2023-09-25 NOTE — Telephone Encounter (Signed)
09/27/23 appt confirmed by pt

## 2023-09-26 ENCOUNTER — Ambulatory Visit (INDEPENDENT_AMBULATORY_CARE_PROVIDER_SITE_OTHER): Payer: Medicare HMO

## 2023-09-26 VITALS — Ht <= 58 in | Wt 182.0 lb

## 2023-09-26 DIAGNOSIS — H9193 Unspecified hearing loss, bilateral: Secondary | ICD-10-CM | POA: Diagnosis not present

## 2023-09-26 DIAGNOSIS — Z0001 Encounter for general adult medical examination with abnormal findings: Secondary | ICD-10-CM | POA: Diagnosis not present

## 2023-09-26 DIAGNOSIS — Z0000 Encounter for general adult medical examination without abnormal findings: Secondary | ICD-10-CM

## 2023-09-26 NOTE — Patient Instructions (Signed)
Kimberly Best , Thank you for taking time to come for your Medicare Wellness Visit. I appreciate your ongoing commitment to your health goals. Please review the following plan we discussed and let me know if I can assist you in the future.   Referrals/Orders/Follow-Ups/Clinician Recommendations:  Next Medicare Annual Wellness Visit: September 29, 2024 at 3:10pm virtual visit  You have asked to have a hearing test due to hearing loss. You've been referred to [facility]. If you haven't heard from them in about 7-10 business days, please call their office to schedule an appointment.   [facility] [address]  [address] [place] [address] PHONE: [phone]   This is a list of the screening recommended for you and due dates:  Health Maintenance  Topic Date Due   COVID-19 Vaccine (1) Never done   Zoster (Shingles) Vaccine (1 of 2) Never done   Flu Shot  07/12/2023   Complete foot exam   08/18/2023   Eye exam for diabetics  01/03/2024   Hemoglobin A1C  01/24/2024   Medicare Annual Wellness Visit  09/25/2024   DTaP/Tdap/Td vaccine (2 - Td or Tdap) 06/16/2030   Pneumonia Vaccine  Completed   DEXA scan (bone density measurement)  Completed   HPV Vaccine  Aged Out    Advanced directives: (Provided) Advance directive discussed with you today. I have provided a copy for you to complete at home and have notarized. Once this is complete, please bring a copy in to our office so we can scan it into your chart.   Next Medicare Annual Wellness Visit scheduled for next year: Yes  Preventive Care 23 Years and Older, Female Preventive care refers to lifestyle choices and visits with your health care provider that can promote health and wellness. Preventive care visits are also called wellness exams. What can I expect for my preventive care visit? Counseling Your health care provider may ask you questions about your: Medical history, including: Past medical problems. Family medical  history. Pregnancy and menstrual history. History of falls. Current health, including: Memory and ability to understand (cognition). Emotional well-being. Home life and relationship well-being. Sexual activity and sexual health. Lifestyle, including: Alcohol, nicotine or tobacco, and drug use. Access to firearms. Diet, exercise, and sleep habits. Work and work Astronomer. Sunscreen use. Safety issues such as seatbelt and bike helmet use. Physical exam Your health care provider will check your: Height and weight. These may be used to calculate your BMI (body mass index). BMI is a measurement that tells if you are at a healthy weight. Waist circumference. This measures the distance around your waistline. This measurement also tells if you are at a healthy weight and may help predict your risk of certain diseases, such as type 2 diabetes and high blood pressure. Heart rate and blood pressure. Body temperature. Skin for abnormal spots. What immunizations do I need?  Vaccines are usually given at various ages, according to a schedule. Your health care provider will recommend vaccines for you based on your age, medical history, and lifestyle or other factors, such as travel or where you work. What tests do I need? Screening Your health care provider may recommend screening tests for certain conditions. This may include: Lipid and cholesterol levels. Hepatitis C test. Hepatitis B test. HIV (human immunodeficiency virus) test. STI (sexually transmitted infection) testing, if you are at risk. Lung cancer screening. Colorectal cancer screening. Diabetes screening. This is done by checking your blood sugar (glucose) after you have not eaten for a  while (fasting). Mammogram. Talk with your health care provider about how often you should have regular mammograms. BRCA-related cancer screening. This may be done if you have a family history of breast, ovarian, tubal, or peritoneal  cancers. Bone density scan. This is done to screen for osteoporosis. Talk with your health care provider about your test results, treatment options, and if necessary, the need for more tests. Follow these instructions at home: Eating and drinking  Eat a diet that includes fresh fruits and vegetables, whole grains, lean protein, and low-fat dairy products. Limit your intake of foods with high amounts of sugar, saturated fats, and salt. Take vitamin and mineral supplements as recommended by your health care provider. Do not drink alcohol if your health care provider tells you not to drink. If you drink alcohol: Limit how much you have to 0-1 drink a day. Know how much alcohol is in your drink. In the [place] one drink equals one 12 oz bottle of beer (355 mL), one 5 oz glass of wine (148 mL), or one 1 oz glass of hard liquor (44 mL). Lifestyle Brush your teeth every morning and night with fluoride toothpaste. Floss one time each day. Exercise for at least 30 minutes 5 or more days each week. Do not use any products that contain nicotine or tobacco. These products include cigarettes, chewing tobacco, and vaping devices, such as e-cigarettes. If you need help quitting, ask your health care provider. Do not use drugs. If you are sexually active, practice safe sex. Use a condom or other form of protection in order to prevent STIs. Take aspirin only as told by your health care provider. Make sure that you understand how much to take and what form to take. Work with your health care provider to find out whether it is safe and beneficial for you to take aspirin daily. Ask your health care provider if you need to take a cholesterol-lowering medicine (statin). Find healthy ways to manage stress, such as: Meditation, yoga, or listening to music. Journaling. Talking to a trusted person. Spending time with friends and family. Minimize exposure to UV radiation to reduce your risk of skin  cancer. Safety Always wear your seat belt while driving or riding in a vehicle. Do not drive: If you have been drinking alcohol. Do not ride with someone who has been drinking. When you are tired or distracted. While texting. If you have been using any mind-altering substances or drugs. Wear a helmet and other protective equipment during sports activities. If you have firearms in your house, make sure you follow all gun safety procedures. What's next? Visit your health care provider once a year for an annual wellness visit. Ask your health care provider how often you should have your eyes and teeth checked. Stay up to date on all vaccines. This information is not intended to replace advice given to you by your health care provider. Make sure you discuss any questions you have with your health care provider. Document Revised: 05/25/2021 Document Reviewed: 05/25/2021 Elsevier Patient Education  2024 [employer]. Understanding Your Risk for Falls Millions of people have serious injuries from falls each year. It is important to understand your risk of falling. Talk with your health care provider about your risk and what you can do to lower it. If you do have a serious fall, make sure to tell your provider. Falling once raises your risk of falling again. How can falls affect me? Serious injuries from falls are common. These include: Broken  bones, such as hip fractures. Head injuries, such as traumatic brain injuries (TBI) or concussions. A fear of falling can cause you to avoid activities and stay at home. This can make your muscles weaker and raise your risk for a fall. What can increase my risk? There are a number of risk factors that increase your risk for falling. The more risk factors you have, the higher your risk of falling. Serious injuries from a fall happen most often to people who are older than 87 years old. Teenagers and young adults ages 62-29 are also at higher risk. Common  risk factors include: Weakness in the lower body. Being generally weak or confused due to long-term (chronic) illness. Dizziness or balance problems. Poor vision. Medicines that cause dizziness or drowsiness. These may include: Medicines for your blood pressure, heart, anxiety, insomnia, or swelling (edema). Pain medicines. Muscle relaxants. Other risk factors include: Drinking alcohol. Having had a fall in the past. Having foot pain or wearing improper footwear. Working at a dangerous job. Having any of the following in your home: Tripping hazards, such as floor clutter or loose rugs. Poor lighting. Pets. Having dementia or memory loss. What actions can I take to lower my risk of falling?     Physical activity Stay physically fit. Do strength and balance exercises. Consider taking a regular class to build strength and balance. Yoga and tai chi are good options. Vision Have your eyes checked every year and your prescription for glasses or contacts updated as needed. Shoes and walking aids Wear non-skid shoes. Wear shoes that have rubber soles and low heels. Do not wear high heels. Do not walk around the house in socks or slippers. Use a cane or walker as told by your provider. Home safety Attach secure railings on both sides of your stairs. Install grab bars for your bathtub, shower, and toilet. Use a non-skid mat in your bathtub or shower. Attach bath mats securely with double-sided, non-slip rug tape. Use good lighting in all rooms. Keep a flashlight near your bed. Make sure there is a clear path from your bed to the bathroom. Use night-lights. Do not use throw rugs. Make sure all carpeting is taped or tacked down securely. Remove all clutter from walkways and stairways, including extension cords. Repair uneven or broken steps and floors. Avoid walking on icy or slippery surfaces. Walk on the grass instead of on icy or slick sidewalks. Use ice melter to get rid of ice on  walkways in the winter. Use a cordless phone. Questions to ask your health care provider Can you help me check my risk for a fall? Do any of my medicines make me more likely to fall? Should I take a vitamin D supplement? What exercises can I do to improve my strength and balance? Should I make an appointment to have my vision checked? Do I need a bone density test to check for weak bones (osteoporosis)? Would it help to use a cane or a walker? Where to find more information Centers for Disease Control and Prevention, [employer]: TonerPromos.no Community-Based Fall Prevention Programs: TonerPromos.no [employer] on Aging: BaseRingTones.pl Contact a health care provider if: You fall at home. You are afraid of falling at home. You feel weak, drowsy, or dizzy. This information is not intended to replace advice given to you by your health care provider. Make sure you discuss any questions you have with your health care provider. Document Revised: 07/31/2022 Document Reviewed: 07/31/2022 Elsevier Patient Education  2024 [employer].

## 2023-09-26 NOTE — Progress Notes (Signed)
Because this visit was a virtual/telehealth visit,  certain criteria was not obtained, such a blood pressure, CBG if applicable, and timed get up and go. Any medications not marked as "taking" were not mentioned during the medication reconciliation part of the visit. Any vitals not documented were not able to be obtained due to this being a telehealth visit or patient was unable to self-report a recent blood pressure reading due to a lack of equipment at home via telehealth. Vitals that have been documented are verbally provided by the patient.   Subjective:   Kimberly Best is a 89 y.o. female who presents for Medicare Annual (Subsequent) preventive examination.  Visit Complete: Virtual I connected with  Kimberly Best on 09/26/23 by a audio enabled telemedicine application and verified that I am speaking with the correct person using two identifiers.  Patient Location: Home  Provider Location: Home Office  I discussed the limitations of evaluation and management by telemedicine. The patient expressed understanding and agreed to proceed.  Vital Signs: Because this visit was a virtual/telehealth visit, some criteria may be missing or patient reported. Any vitals not documented were not able to be obtained and vitals that have been documented are patient reported.  Patient Medicare AWV questionnaire was completed by the patient on na; I have confirmed that all information answered by patient is correct and no changes since this date.  Cardiac Risk Factors include: advanced age (>68men, >90 women);diabetes mellitus;dyslipidemia;hypertension;sedentary lifestyle;obesity (BMI >30kg/m2)     Objective:    Today's Vitals   09/26/23 1451  Weight: 182 lb (82.6 kg)  Height: 4\' 8"  (1.422 m)   Body mass index is 40.8 kg/m.     09/05/2022    3:45 PM 12/20/2021    4:48 PM 07/18/2021    8:49 AM 06/28/2021    9:21 PM 06/15/2021   10:23 AM 06/04/2020    8:12 AM 05/26/2020    7:38 AM  Advanced  Directives  Does Patient Have a Medical Advance Directive? No No No No;Yes No No No  Type of Advance Directive    Living will     Would patient like information on creating a medical advance directive? No - Patient declined No - Patient declined No - Patient declined  Yes (MAU/Ambulatory/Procedural Areas - Information given) No - Patient declined No - Patient declined    Current Medications (verified) Outpatient Encounter Medications as of 09/26/2023  Medication Sig   acetaminophen (TYLENOL) 650 MG CR tablet Take 650 mg by mouth 2 (two) times daily as needed for pain.   amLODipine (NORVASC) 2.5 MG tablet TAKE ONE TABLET (2.5MG  TOTAL) BY MOUTH DAILY   cholecalciferol (VITAMIN D) 1000 UNITS tablet Take 1,000 Units by mouth daily.   clotrimazole-betamethasone (LOTRISONE) cream For up to 14 days and then stop the medication.   empagliflozin (JARDIANCE) 25 MG TABS tablet Take 1 tablet (25 mg total) by mouth daily before breakfast.   glimepiride (AMARYL) 4 MG tablet TAKE ONE TABLET (4 MG TOTAL) BY MOUTH DAILY, BEFORE BREAKFAST.   Lancets (ONETOUCH DELICA PLUS LANCET33G) MISC USE AS DIRECTED TO MONITOR BLOOD SUGAR TWICE DAILY.   meclizine (ANTIVERT) 25 MG tablet TAKE ONE TABLET (25MG  TOTAL) BY MOUTH THREE TIMES DAILY AS NEEDED FOR DIZZINESS   montelukast (SINGULAIR) 10 MG tablet TAKE ONE TABLET (10MG  TOTAL) BY MOUTH ATBEDTIME   Multiple Vitamins-Minerals (IMMUNE SUPPORT PO) Take 1 tablet by mouth daily.   ONETOUCH ULTRA test strip USE AS DIRECTED TO MONITOR BLOOD SUGAR TWICE DAILY.  pantoprazole (PROTONIX) 40 MG tablet TAKE ONE TABLET (40MG ) BY MOUTH DAILY   rosuvastatin (CRESTOR) 5 MG tablet TAKE ONE (1) TABLET BY MOUTH EVERY DAY   SLOW FE 142 (45 Fe) MG TBCR Take 2 tablets by mouth every morning. (Patient taking differently: Take 1 tablet by mouth every morning.)   triamcinolone (NASACORT) 55 MCG/ACT AERO nasal inhaler Place 2 sprays into the nose daily.   No facility-administered encounter  medications on file as of 09/26/2023.    Allergies (verified) Lisinopril and Penicillins   History: Past Medical History:  Diagnosis Date   Allergy    Anxiety    Arthritis    Cancer (HCC)    right breast cancer   Diabetes mellitus without complication (HCC)    GERD (gastroesophageal reflux disease)    H. pylori infection 05/2020   S/p treatment with Pylera.  H. pylori stool antigen negative on 12/21/2020.   HLD (hyperlipidemia)    Hypertension    IDA (iron deficiency anemia)    Hg in 9-10 range since September 2019.   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     ESOPHAGOGASTRODUODENOSCOPY N/A 05/26/2020   Procedure: ESOPHAGOGASTRODUODENOSCOPY (EGD);  Surgeon: Corbin Ade, MD;  Normal esophagus s/p dilation, mucosal changes in the stomach biopsied, normal examined duodenum.  Pathology revealed H. pylori.   EYE SURGERY     GIVENS CAPSULE STUDY N/A 02/16/2021   Procedure: GIVENS CAPSULE STUDY;  Surgeon: Corbin Ade, MD;  gastric and duodenal bulbar erosions, 2 nonbleeding small bowel ulcers with benign appearance suspected to be secondary to chronic aspirin use.   Family History  Problem Relation Age of Onset   Cancer Father    Stroke Mother    Hypertension Mother    Diabetes Sister    Hypertension Sister    Cancer Brother    Hypertension Brother    Bipolar disorder Daughter    Hypertension Maternal Grandmother    Tuberculosis Paternal Grandmother    Hypertension Paternal Grandmother    Alcohol abuse Paternal Grandfather    Hypertension  Paternal Grandfather    Colon cancer Neg Hx    Social History   Socioeconomic History   Marital status: Widowed    Spouse name: Not on file   Number of children: Not on file   Years of education: Not on file   Highest education level: Not on file  Occupational History   Not on file  Tobacco Use   Smoking status: Never   Smokeless tobacco: Never  Vaping Use   Vaping status: Never Used  Substance and Sexual Activity   Alcohol use: Not Currently    Comment: Couple ounces of champagne once every couple of weeks or less.   Drug use: No   Sexual activity: Not Currently    Birth control/protection: Post-menopausal, Abstinence  Other Topics Concern   Not on file  Social History Narrative   Widow since 2015,married for 56 years.Lives alone.Retired.   Social Determinants of Health   Financial Resource Strain: Medium Risk (09/26/2023)   Overall Financial Resource Strain (CARDIA)  Difficulty of Paying Living Expenses: Somewhat hard  Food Insecurity: No Food Insecurity (09/26/2023)   Hunger Vital Sign    Worried About Running Out of Food in the Last Year: Never true    Ran Out of Food in the Last Year: Never true  Transportation Needs: No Transportation Needs (09/26/2023)   PRAPARE - Administrator, Civil Service (Medical): No    Lack of Transportation (Non-Medical): No  Physical Activity: Insufficiently Active (09/26/2023)   Exercise Vital Sign    Days of Exercise per Week: 3 days    Minutes of Exercise per Session: 30 min  Stress: No Stress Concern Present (09/26/2023)   [employer] of Occupational Health - Occupational Stress Questionnaire    Feeling of Stress : Only a little  Social Connections: Unknown (09/26/2023)   Social Connection and Isolation Panel [NHANES]    Frequency of Communication with Friends and Family: More than three times a week    Frequency of Social Gatherings with Friends and Family: More than three times a week    Attends Religious  Services: More than 4 times per year    Active Member of [employer] or Organizations: No    Attends Engineer, structural: Never    Marital Status: Patient declined    Tobacco Counseling Counseling given: Yes   Clinical Intake:  Pre-visit preparation completed: Yes  Pain : No/denies pain     BMI - recorded: 40.8 Nutritional Status: BMI > 30  Obese Nutritional Risks: None Diabetes: Yes CBG done?: No (telehealth visit.) Did pt. bring in CBG monitor from home?: No  How often do you need to have someone help you when you read instructions, pamphlets, or other written materials from your doctor or pharmacy?: 1 - Never  Interpreter Needed?: No  Information entered by :: Kimberly Best, CMA   Activities of Daily Living    09/26/2023    3:10 PM  In your present state of health, do you have any difficulty performing the following activities:  Hearing? 1  Comment referral placed today  Vision? 0  Difficulty concentrating or making decisions? 0  Walking or climbing stairs? 1  Comment uses a electric scooter and cane  Dressing or bathing? 0  Doing errands, shopping? 0  Preparing Food and eating ? N  Using the Toilet? N  In the past six months, have you accidently leaked urine? N  Do you have problems with loss of bowel control? N  Managing your Medications? N  Managing your Finances? N  Housekeeping or managing your Housekeeping? Y  Comment has someone come in 3 days a week to take care of housekeeping    Patient Care Team: Gilmore Laroche, FNP as PCP - General (Family Medicine) Jena Gauss, Gerrit Friends, MD as Consulting Physician (Gastroenterology)  Indicate any recent Medical Services you may have received from other than [facility] providers in the past year (date may be approximate).     Assessment:   This is a routine wellness examination for Kimberly Best.  Hearing/Vision screen Hearing Screening - Comments:: Patient complains of difficulty hearing bilateral. Referral for  audiology placed for patient today.  Vision Screening - Comments:: Wears rx glasses - up to date with routine eye exams with Dr. Dione Booze    Goals Addressed             This Visit's Progress    Patient Stated       I'd like to get a new roof on my house  Depression Screen    09/26/2023    2:56 PM 09/21/2023    1:42 PM 09/06/2023   10:03 AM 08/30/2023    9:13 AM 08/16/2023    4:09 PM 07/23/2023    2:43 PM 06/25/2023    2:33 PM  PHQ 2/9 Scores  PHQ - 2 Score 0 0 0 3 1 3 6   PHQ- 9 Score 0 3 6 13 8 10 18     Fall Risk    09/26/2023    3:10 PM 08/16/2023    4:09 PM 06/25/2023    2:33 PM 04/23/2023    4:13 PM 12/18/2022   10:54 AM  Fall Risk   Falls in the past year? 0 0 0 0 1  Number falls in past yr: 0 0 0 0 1  Injury with Fall? 0 0 0 0 0  Risk for fall due to : No Fall Risks   No Fall Risks No Fall Risks  Follow up Falls prevention discussed   Falls evaluation completed Falls evaluation completed    MEDICARE RISK AT HOME: Medicare Risk at Home Any stairs in or around the home?: No If so, are there any without handrails?: No Home free of loose throw rugs in walkways, pet beds, electrical cords, etc?: Yes Adequate lighting in your home to reduce risk of falls?: Yes Life alert?: No Use of a cane, walker or w/c?: Yes Grab bars in the bathroom?: Yes Shower chair or bench in shower?: Yes Elevated toilet seat or a handicapped toilet?: No  TIMED UP AND GO:  Was the test performed?  No    Cognitive Function:    09/05/2022    3:48 PM  MMSE - Mini Mental State Exam  Not completed: Unable to complete        09/26/2023    2:56 PM 09/05/2022    3:48 PM 06/15/2021   10:27 AM 06/04/2020    8:19 AM  6CIT Screen  What Year? 0 points 0 points 0 points 0 points  What month? 0 points 0 points 0 points 0 points  What time? 0 points 0 points 0 points 0 points  Count back from 20 0 points 2 points 2 points 2 points  Months in reverse 0 points 2 points 0 points 0 points  Repeat  phrase 0 points 0 points 2 points 8 points  Total Score 0 points 4 points 4 points 10 points    Immunizations Immunization History  Administered Date(s) Administered   Fluad Quad(high Dose 65+) 12/13/2022   Influenza, High Dose Seasonal PF 09/21/2020   Influenza-Unspecified 09/27/2016, 09/30/2018, 08/21/2019   Pneumococcal Conjugate-13 08/18/2014   Pneumococcal Polysaccharide-23 08/24/2015   Tdap 06/16/2020    TDAP status: Up to date  Flu Vaccine status: Due, Education has been provided regarding the importance of this vaccine. Advised may receive this vaccine at local pharmacy or Health Dept. Aware to provide a copy of the vaccination record if obtained from local pharmacy or Health Dept. Verbalized acceptance and understanding.  Pneumococcal vaccine status: Up to date  Covid-19 vaccine status: Information provided on how to obtain vaccines.   Qualifies for Shingles Vaccine? Yes   Zostavax completed No   Shingrix Completed?: No.    Education has been provided regarding the importance of this vaccine. Patient has been advised to call insurance company to determine out of pocket expense if they have not yet received this vaccine. Advised may also receive vaccine at local pharmacy or Health Dept. Verbalized acceptance and understanding.  Screening Tests Health Maintenance  Topic Date Due   COVID-19 Vaccine (1) Never done   Zoster Vaccines- Shingrix (1 of 2) Never done   INFLUENZA VACCINE  07/12/2023   FOOT EXAM  08/18/2023   Medicare Annual Wellness (AWV)  09/06/2023   OPHTHALMOLOGY EXAM  01/03/2024   HEMOGLOBIN A1C  01/24/2024   DTaP/Tdap/Td (2 - Td or Tdap) 06/16/2030   Pneumonia Vaccine 79+ Years old  Completed   DEXA SCAN  Completed   HPV VACCINES  Aged Out    Health Maintenance  Health Maintenance Due  Topic Date Due   COVID-19 Vaccine (1) Never done   Zoster Vaccines- Shingrix (1 of 2) Never done   INFLUENZA VACCINE  07/12/2023   FOOT EXAM  08/18/2023   Medicare  Annual Wellness (AWV)  09/06/2023    Colorectal cancer screening: No longer required.   Mammogram status: No longer required due to age.  Bone Density Screening: Not age appropriate for this patient.   Lung Cancer Screening: (Low Dose CT Chest recommended if Age 37-80 years, 20 pack-year currently smoking OR have quit w/in 15years.) does not qualify.   Lung Cancer Screening Referral: na  Additional Screening:  Hepatitis C Screening: does not qualify; Vision Screening: Recommended annual ophthalmology exams for early detection of glaucoma and other disorders of the eye. Is the patient up to date with their annual eye exam?  Yes  Who is the provider or what is the name of the office in which the patient attends annual eye exams? Dr. Dione Booze If pt is not established with a provider, would they like to be referred to a provider to establish care? No .   Dental Screening: Recommended annual dental exams for proper oral hygiene  Diabetic Foot Exam: Diabetic Foot Exam: Overdue, Pt has been advised about the importance in completing this exam. Pt is scheduled for diabetic foot exam on 10/23/2023.  Community Resource Referral / Chronic Care Management: CRR required this visit?  No   CCM required this visit?  No     Plan:     I have personally reviewed and noted the following in the patient's chart:   Medical and social history Use of alcohol, tobacco or illicit drugs  Current medications and supplements including opioid prescriptions. Patient is not currently taking opioid prescriptions. Functional ability and status Nutritional status Physical activity Advanced directives List of other physicians Hospitalizations, surgeries, and ER visits in previous 12 months Vitals Screenings to include cognitive, depression, and falls Referrals and appointments  In addition, I have reviewed and discussed with patient certain preventive protocols, quality metrics, and best practice  recommendations. A written personalized care plan for preventive services as well as general preventive health recommendations were provided to patient.     Jordan Hawks Mishell Donalson, CMA   09/26/2023   After Visit Summary: (Mail) Due to this being a telephonic visit, the after visit summary with patients personalized plan was offered to patient via mail   Nurse Notes: referral placed for audiology today

## 2023-09-27 ENCOUNTER — Ambulatory Visit (HOSPITAL_COMMUNITY): Payer: Medicare HMO | Admitting: Psychiatry

## 2023-10-05 ENCOUNTER — Ambulatory Visit: Payer: Medicare HMO | Admitting: Professional Counselor

## 2023-10-05 DIAGNOSIS — F330 Major depressive disorder, recurrent, mild: Secondary | ICD-10-CM | POA: Diagnosis not present

## 2023-10-05 NOTE — BH Specialist Note (Unsigned)
[facility] Telephone Follow-up  MRN: [id number] NAME: Kimberly Best Date: 10/05/23  Start time: Start Time: 0130 End time: Stop Time: 0200 Total time: Total Time in Minutes (Visit): 30 Call number: Visit Number: 5-Fifth Visit  Reason for call today:  The patient, an 89 year old female, presented for a collaborative care follow-up in a notably positive mood. She expressed significant improvement in her symptoms, describing herself as feeling calm, relaxed, lighthearted, hopeful, and very positive. She shared that she has been able to let go of many of her worries and is relying more on her faith, which has contributed to her sense of relief and comfort. She reported no issues with sleep, mentioning that she has been sleeping well, eating well, and has a good appetite. Overall, she feels she has achieved what she came for in collaborative care and is very pleased with the support she has received. We have scheduled one more follow-up in a month to assess her symptoms and consider concluding her participation in the program.  PHQ-9 Scores:     10/05/2023    1:35 PM 09/26/2023    2:56 PM 09/21/2023    1:42 PM 09/06/2023   10:03 AM 08/30/2023    9:13 AM  Depression screen PHQ 2/9  Decreased Interest 0 0 0 0 2  Down, Depressed, Hopeless 0 0 0 0 1  PHQ - 2 Score 0 0 0 0 3  Altered sleeping 0 0 0 0 2  Tired, decreased energy 0 0 0 0 1  Change in appetite 0 0 2 1 3   Feeling bad or failure about yourself  0 0 0 1 1  Trouble concentrating 0 0  1 0  Moving slowly or fidgety/restless 0 0 1 3 3   Suicidal thoughts 0 0 0 0 0  PHQ-9 Score 0 0 3 6 13   Difficult doing work/chores Not difficult at all Not difficult at all Somewhat difficult Somewhat difficult Somewhat difficult   GAD-7 Scores:     10/05/2023    1:36 PM 09/21/2023    1:43 PM 09/06/2023   10:04 AM 08/30/2023    9:16 AM  GAD 7 : Generalized Anxiety Score  Nervous, Anxious, on Edge 0 0 0 3  Control/stop worrying 0 0 0 3   Worry too much - different things 0 1 1 3   Trouble relaxing 0 0 1 1  Restless 0 0 0 1  Easily annoyed or irritable 0 0 0 3  Afraid - awful might happen 0 1 1 1   Total GAD 7 Score 0 2 3 15   Anxiety Difficulty Somewhat difficult Somewhat difficult Somewhat difficult Somewhat difficult    Stress Current stressors:  Daughter has court Sleep:  Good Appetite:  Good Coping ability:  Good Patient taking medications as prescribed:  Yes  Current medications:  Outpatient Encounter Medications as of 10/05/2023  Medication Sig   acetaminophen (TYLENOL) 650 MG CR tablet Take 650 mg by mouth 2 (two) times daily as needed for pain.   amLODipine (NORVASC) 2.5 MG tablet TAKE ONE TABLET (2.5MG  TOTAL) BY MOUTH DAILY   cholecalciferol (VITAMIN D) 1000 UNITS tablet Take 1,000 Units by mouth daily.   clotrimazole-betamethasone (LOTRISONE) cream For up to 14 days and then stop the medication.   empagliflozin (JARDIANCE) 25 MG TABS tablet Take 1 tablet (25 mg total) by mouth daily before breakfast.   glimepiride (AMARYL) 4 MG tablet TAKE ONE TABLET (4 MG TOTAL) BY MOUTH DAILY, BEFORE BREAKFAST.   Lancets [facility]  PLUS LANCET33G) MISC USE AS DIRECTED TO MONITOR BLOOD SUGAR TWICE DAILY.   meclizine (ANTIVERT) 25 MG tablet TAKE ONE TABLET (25MG  TOTAL) BY MOUTH THREE TIMES DAILY AS NEEDED FOR DIZZINESS   montelukast (SINGULAIR) 10 MG tablet TAKE ONE TABLET (10MG  TOTAL) BY MOUTH ATBEDTIME   Multiple Vitamins-Minerals (IMMUNE SUPPORT PO) Take 1 tablet by mouth daily.   ONETOUCH ULTRA test strip USE AS DIRECTED TO MONITOR BLOOD SUGAR TWICE DAILY.   pantoprazole (PROTONIX) 40 MG tablet TAKE ONE TABLET (40MG ) BY MOUTH DAILY   rosuvastatin (CRESTOR) 5 MG tablet TAKE ONE (1) TABLET BY MOUTH EVERY DAY   SLOW FE 142 (45 Fe) MG TBCR Take 2 tablets by mouth every morning. (Patient taking differently: Take 1 tablet by mouth every morning.)   triamcinolone (NASACORT) 55 MCG/ACT AERO nasal inhaler Place 2 sprays into  the nose daily.   No facility-administered encounter medications on file as of 10/05/2023.     Self-harm Behaviors Risk Assessment Self-harm risk factors:  Aging and depression Patient endorses recent thoughts of harming self:  Denies   Danger to Others Risk Assessment Danger to others risk factors:  None Patient endorses recent thoughts of harming others:  Denies   Substance Use Assessment Patient recently consumed alcohol:  No  Alcohol Use Disorder Identification Test (AUDIT):     09/05/2022    3:45 PM 09/26/2023    2:58 PM  Alcohol Use Disorder Test (AUDIT)  1. How often do you have a drink containing alcohol? 0 0  2. How many drinks containing alcohol do you have on a typical day when you are drinking? 0 0  3. How often do you have six or more drinks on one occasion? 0 0  AUDIT-C Score 0 0    Goals, Interventions and Follow-up Plan Goals:  Relapse prevention Interventions: Mindfulness or Relaxation Training, Behavioral Activation, and CBT Cognitive Behavioral Therapy Follow-up Plan: One month follow up   Reuel Boom

## 2023-10-19 ENCOUNTER — Other Ambulatory Visit: Payer: Self-pay | Admitting: Family Medicine

## 2023-10-19 ENCOUNTER — Telehealth: Payer: Self-pay | Admitting: Family Medicine

## 2023-10-19 DIAGNOSIS — E118 Type 2 diabetes mellitus with unspecified complications: Secondary | ICD-10-CM

## 2023-10-19 MED ORDER — ONETOUCH ULTRA VI STRP
ORAL_STRIP | 0 refills | Status: DC
Start: 2023-10-19 — End: 2023-10-19

## 2023-10-19 NOTE — Telephone Encounter (Signed)
Patient came into office can not afford test strips $75.00.  would like to see nurse. Teams message was sent   ACCU-CHEK AVIVA PLUS test strip [[id number]]

## 2023-10-19 NOTE — Telephone Encounter (Signed)
Spoke with pt test strips were sent in today

## 2023-10-23 ENCOUNTER — Encounter: Payer: Self-pay | Admitting: Family Medicine

## 2023-10-23 ENCOUNTER — Other Ambulatory Visit (HOSPITAL_COMMUNITY): Payer: Self-pay | Admitting: Family Medicine

## 2023-10-23 ENCOUNTER — Ambulatory Visit (INDEPENDENT_AMBULATORY_CARE_PROVIDER_SITE_OTHER): Payer: Medicare HMO | Admitting: Family Medicine

## 2023-10-23 VITALS — BP 138/70 | HR 82 | Resp 16 | Ht <= 58 in | Wt 179.1 lb

## 2023-10-23 DIAGNOSIS — R42 Dizziness and giddiness: Secondary | ICD-10-CM | POA: Diagnosis not present

## 2023-10-23 DIAGNOSIS — E118 Type 2 diabetes mellitus with unspecified complications: Secondary | ICD-10-CM

## 2023-10-23 DIAGNOSIS — E7849 Other hyperlipidemia: Secondary | ICD-10-CM

## 2023-10-23 DIAGNOSIS — E559 Vitamin D deficiency, unspecified: Secondary | ICD-10-CM | POA: Diagnosis not present

## 2023-10-23 DIAGNOSIS — I10 Essential (primary) hypertension: Secondary | ICD-10-CM

## 2023-10-23 DIAGNOSIS — Z1231 Encounter for screening mammogram for malignant neoplasm of breast: Secondary | ICD-10-CM

## 2023-10-23 DIAGNOSIS — E038 Other specified hypothyroidism: Secondary | ICD-10-CM | POA: Diagnosis not present

## 2023-10-23 DIAGNOSIS — Z7984 Long term (current) use of oral hypoglycemic drugs: Secondary | ICD-10-CM | POA: Diagnosis not present

## 2023-10-23 DIAGNOSIS — J028 Acute pharyngitis due to other specified organisms: Secondary | ICD-10-CM

## 2023-10-23 DIAGNOSIS — B9789 Other viral agents as the cause of diseases classified elsewhere: Secondary | ICD-10-CM | POA: Insufficient documentation

## 2023-10-23 MED ORDER — EMPAGLIFLOZIN 25 MG PO TABS: 25.0000 mg | ORAL_TABLET | Freq: Every day | ORAL | 3 refills | Status: DC

## 2023-10-23 MED ORDER — GLIMEPIRIDE 4 MG PO TABS: 4.0000 mg | ORAL_TABLET | Freq: Every day | ORAL | 3 refills | Status: DC

## 2023-10-23 MED ORDER — MECLIZINE HCL 25 MG PO TABS: 25.0000 mg | ORAL_TABLET | Freq: Three times a day (TID) | ORAL | 2 refills | Status: DC

## 2023-10-23 NOTE — Patient Instructions (Addendum)
I appreciate the opportunity to provide care to you today!    Follow up:  4 months  Labs: please stop by the lab today/during the week to get your blood drawn (CBC, CMP, TSH, Lipid profile, HgA1c, Vit D) Dizziness  For Vertigo or Dizziness: Timing: Take Meclizine 25 mg per day, divided into 2-3 doses. Dosage: take it at the first sign of vertigo or dizziness General Guidelines: With food: Meclizine can be taken with or without food. Avoid alcohol: Alcohol can increase the sedative effects, so it's best to avoid drinking while on this medication. Side effects: Common side effects include drowsiness, dry mouth, or blurred vision. If these are bothersome, it's important to adjust the dose or timing, as per your doctor's advice.  Avoid sudden positional changes Maintain adequate hydration and eat at regular intervals  Sore throat Warm salt water gargles 3-4 times daily to help with throat pain or discomfort. Recommend using a humidifier at bedtime during sleep to help with cough and nasal congestion. Follow-up if your symptoms do not improve    Please pick up your refills at the pharmacy  Attached with your AVS, you will find valuable resources for self-education. I highly recommend dedicating some time to thoroughly examine them.   Please continue to a heart-healthy diet and increase your physical activities. Try to exercise for at least five days a week.    It was a pleasure to see you and I look forward to continuing to work together on your health and well-being. Please do not hesitate to call the office if you need care or have questions about your care.  In case of emergency, please visit the Emergency Department for urgent care, or contact our clinic at [phone] to schedule an appointment. We're here to help you!   Have a wonderful day and week. With Gratitude, Gilmore Laroche MSN, FNP-BC

## 2023-10-23 NOTE — Assessment & Plan Note (Signed)
She takes Jardiance 25 mg daily and glimepiride 4 mg daily. No reports of polyuria, polyphagia, or dyspnea. Encouraged to decrease intake of high-sugar foods and beverages and to increase physical activity.

## 2023-10-23 NOTE — Assessment & Plan Note (Signed)
Well-controlled with amlodipine Low-sodium diet with increase physical activity encourage BP Readings from Last 3 Encounters:  10/23/23 138/70  08/16/23 138/74  07/23/23 122/77

## 2023-10-23 NOTE — Assessment & Plan Note (Signed)
Chronic dizziness-has had ENT evaluation and vestibular therapy For Vertigo or Dizziness: Timing: Take Meclizine 25 mg per day, divided into 2-3 doses. Dosage: take it at the first sign of vertigo or dizziness General Guidelines: With food: Meclizine can be taken with or without food. Avoid alcohol: Alcohol can increase the sedative effects, so it's best to avoid drinking while on this medication. Side effects: Common side effects include drowsiness, dry mouth, or blurred vision. If these are bothersome, it's important to adjust the dose or timing, as per your doctor's advice.

## 2023-10-23 NOTE — Assessment & Plan Note (Signed)
No headaches, facial pain or pressure, runny nose, nasal congestion, or cough reported. Complaints of a sore throat that started a few days ago. She is eating and swallowing fine without pain, choking, or regurgitation. The left side of the neck was palpated, and no reactive lymph nodes were felt. Recommended warm saltwater gargles 3-4 times daily to alleviate the sore throat.

## 2023-10-23 NOTE — Progress Notes (Signed)
Established Patient Office Visit  Subjective:  Patient ID: Kimberly Best, female    DOB: 18-Jan-1933  Age: 89 y.o. MRN: [id number]  CC:  Chief Complaint  Patient presents with   Sore Throat    States she has a sore area on the left side of her neck for a few days    Dizziness    States the vertigo is still bothering her. States the med helps some but it won't go away    HPI Kimberly Best is a 89 y.o. female with past medical history of hypertension, hyperlipidemia, and type 2 diabetes presents for f/u of  chronic medical conditions. For the details of today's visit, please refer to the assessment and plan.     Past Medical History:  Diagnosis Date   Allergy    Anxiety    Arthritis    Cancer (HCC)    right breast cancer   Diabetes mellitus without complication (HCC)    GERD (gastroesophageal reflux disease)    H. pylori infection 05/2020   S/p treatment with Pylera.  H. pylori stool antigen negative on 12/21/2020.   HLD (hyperlipidemia)    Hypertension    IDA (iron deficiency anemia)    Hg in 9-10 range since September 2019.   Pessary maintenance 03/10/2015    Past Surgical History:  Procedure Laterality Date   BIOPSY  05/26/2020   Procedure: BIOPSY;  Surgeon: Corbin Ade, MD;  Location: [facility];  Service: Endoscopy;;   BREAST SURGERY Right    lumpectomy    CHOLECYSTECTOMY     COLONOSCOPY N/A 05/26/2020   Procedure: COLONOSCOPY;  Surgeon: Corbin Ade, MD; Diverticulosis in sigmoid colon, friable colonic mucosa likely secondary to ASA/NSAID insult, exam was otherwise normal.     ESOPHAGOGASTRODUODENOSCOPY N/A 05/26/2020   Procedure: ESOPHAGOGASTRODUODENOSCOPY (EGD);  Surgeon: Corbin Ade, MD;  Normal esophagus s/p dilation, mucosal changes in the stomach biopsied, normal examined duodenum.  Pathology revealed H. pylori.   EYE SURGERY     GIVENS CAPSULE STUDY N/A 02/16/2021   Procedure: GIVENS CAPSULE STUDY;  Surgeon: Corbin Ade, MD;  gastric and  duodenal bulbar erosions, 2 nonbleeding small bowel ulcers with benign appearance suspected to be secondary to chronic aspirin use.    Family History  Problem Relation Age of Onset   Cancer Father    Stroke Mother    Hypertension Mother    Diabetes Sister    Hypertension Sister    Cancer Brother    Hypertension Brother    Bipolar disorder Daughter    Hypertension Maternal Grandmother    Tuberculosis Paternal Grandmother    Hypertension Paternal Grandmother    Alcohol abuse Paternal Grandfather    Hypertension Paternal Grandfather    Colon cancer Neg Hx     Social History   Socioeconomic History   Marital status: Widowed    Spouse name: Not on file   Number of children: Not on file   Years of education: Not on file   Highest education level: Not on file  Occupational History   Not on file  Tobacco Use   Smoking status: Never   Smokeless tobacco: Never  Vaping Use   Vaping status: Never Used  Substance and Sexual Activity   Alcohol use: Not Currently    Comment: Couple ounces of champagne once every couple of weeks or less.   Drug use: No   Sexual activity: Not Currently    Birth control/protection: Post-menopausal, Abstinence  Other Topics  Concern   Not on file  Social History Narrative   Widow since 2015,married for 56 years.Lives alone.Retired.   Social Determinants of Health   Financial Resource Strain: Medium Risk (09/26/2023)   Overall Financial Resource Strain (CARDIA)    Difficulty of Paying Living Expenses: Somewhat hard  Food Insecurity: No Food Insecurity (09/26/2023)   Hunger Vital Sign    Worried About Running Out of Food in the Last Year: Never true    Ran Out of Food in the Last Year: Never true  Transportation Needs: No Transportation Needs (09/26/2023)   PRAPARE - Administrator, Civil Service (Medical): No    Lack of Transportation (Non-Medical): No  Physical Activity: Insufficiently Active (09/26/2023)   Exercise Vital Sign     Days of Exercise per Week: 3 days    Minutes of Exercise per Session: 30 min  Stress: No Stress Concern Present (09/26/2023)   [employer] of Occupational Health - Occupational Stress Questionnaire    Feeling of Stress : Only a little  Social Connections: Unknown (09/26/2023)   Social Connection and Isolation Panel [NHANES]    Frequency of Communication with Friends and Family: More than three times a week    Frequency of Social Gatherings with Friends and Family: More than three times a week    Attends Religious Services: More than 4 times per year    Active Member of [employer] or Organizations: No    Attends Banker Meetings: Never    Marital Status: Patient declined  Catering manager Violence: Not At Risk (09/26/2023)   Humiliation, Afraid, Rape, and Kick questionnaire    Fear of Current or Ex-Partner: No    Emotionally Abused: No    Physically Abused: No    Sexually Abused: No    Outpatient Medications Prior to Visit  Medication Sig Dispense Refill   ACCU-CHEK AVIVA PLUS test strip USE AS DIRECTED TO TEST TWICE DAILY. 100 strip 0   Accu-Chek Softclix Lancets lancets USE AS DIRECTED TO TEST TWICE DAILY. 100 each 0   acetaminophen (TYLENOL) 650 MG CR tablet Take 650 mg by mouth 2 (two) times daily as needed for pain.     amLODipine (NORVASC) 2.5 MG tablet TAKE ONE TABLET (2.5MG  TOTAL) BY MOUTH DAILY 90 tablet 1   cholecalciferol (VITAMIN D) 1000 UNITS tablet Take 1,000 Units by mouth daily.     clotrimazole-betamethasone (LOTRISONE) cream For up to 14 days and then stop the medication. 45 g 0   glucose blood (ONETOUCH ULTRA) test strip USE AS DIRECTED TO MONITOR BLOOD SUGAR TWICE DAILY. 100 strip 0   montelukast (SINGULAIR) 10 MG tablet TAKE ONE TABLET (10MG  TOTAL) BY MOUTH ATBEDTIME 30 tablet 3   Multiple Vitamins-Minerals (IMMUNE SUPPORT PO) Take 1 tablet by mouth daily.     pantoprazole (PROTONIX) 40 MG tablet TAKE ONE TABLET (40MG ) BY MOUTH DAILY 90 tablet 0    rosuvastatin (CRESTOR) 5 MG tablet TAKE ONE (1) TABLET BY MOUTH EVERY DAY 90 tablet 1   SLOW FE 142 (45 Fe) MG TBCR Take 2 tablets by mouth every morning. (Patient taking differently: Take 1 tablet by mouth every morning.) 180 tablet 1   triamcinolone (NASACORT) 55 MCG/ACT AERO nasal inhaler Place 2 sprays into the nose daily. 1 each 12   empagliflozin (JARDIANCE) 25 MG TABS tablet Take 1 tablet (25 mg total) by mouth daily before breakfast. 30 tablet 3   glimepiride (AMARYL) 4 MG tablet TAKE ONE TABLET (4 MG TOTAL) BY MOUTH  DAILY, BEFORE BREAKFAST. 60 tablet 3   meclizine (ANTIVERT) 25 MG tablet TAKE ONE TABLET (25MG  TOTAL) BY MOUTH THREE TIMES DAILY AS NEEDED FOR DIZZINESS 30 tablet 2   No facility-administered medications prior to visit.    Allergies  Allergen Reactions   Lisinopril Other (See Comments)    Tongue swelling   Penicillins Swelling    Has patient had a PCN reaction causing immediate rash, facial/tongue/throat swelling, SOB or lightheadedness with hypotension: Yes Has patient had a PCN reaction causing severe rash involving mucus membranes or skin necrosis: No Has patient had a PCN reaction that required hospitalization: No Has patient had a PCN reaction occurring within the last 10 years: No If all of the above answers are "NO", then may proceed with Cephalosporin use.    ROS Review of Systems  Constitutional:  Negative for chills and fever.  HENT:  Positive for sore throat.   Eyes:  Negative for visual disturbance.  Respiratory:  Negative for chest tightness and shortness of breath.   Neurological:  Positive for dizziness. Negative for headaches.      Objective:    Physical Exam HENT:     Head: Normocephalic.     Mouth/Throat:     Mouth: Mucous membranes are moist.  Cardiovascular:     Rate and Rhythm: Normal rate.     Heart sounds: Normal heart sounds.  Pulmonary:     Effort: Pulmonary effort is normal.     Breath sounds: Normal breath sounds.   Neurological:     Mental Status: She is alert.     BP 138/70   Pulse 82   Resp 16   Ht 4\' 8"  (1.422 m)   Wt 179 lb 1.9 oz (81.2 kg)   SpO2 94%   BMI 40.16 kg/m  Wt Readings from Last 3 Encounters:  10/23/23 179 lb 1.9 oz (81.2 kg)  09/26/23 182 lb (82.6 kg)  08/16/23 183 lb 12.8 oz (83.4 kg)    Lab Results  Component Value Date   TSH 1.160 07/24/2023   Lab Results  Component Value Date   WBC 5.2 07/24/2023   HGB 11.0 (L) 07/24/2023   HCT 34.1 07/24/2023   MCV 91 07/24/2023   PLT 247 07/24/2023   Lab Results  Component Value Date   NA 142 07/24/2023   K 4.5 07/24/2023   CO2 25 07/24/2023   GLUCOSE 115 (H) 07/24/2023   BUN 15 07/24/2023   CREATININE 0.80 07/24/2023   BILITOT 0.5 07/24/2023   ALKPHOS 59 07/24/2023   AST 27 07/24/2023   ALT 13 07/24/2023   PROT 7.5 07/24/2023   ALBUMIN 4.3 07/24/2023   CALCIUM 9.7 07/24/2023   ANIONGAP 9 07/18/2021   EGFR 71 07/24/2023   Lab Results  Component Value Date   CHOL 146 07/24/2023   Lab Results  Component Value Date   HDL 57 07/24/2023   Lab Results  Component Value Date   LDLCALC 69 07/24/2023   Lab Results  Component Value Date   TRIG 114 07/24/2023   Lab Results  Component Value Date   CHOLHDL 2.6 07/24/2023   Lab Results  Component Value Date   HGBA1C 6.8 (H) 07/24/2023      Assessment & Plan:  Controlled type 2 diabetes mellitus with complication, without long-term current use of insulin (HCC) Assessment & Plan: She takes Jardiance 25 mg daily and glimepiride 4 mg daily. No reports of polyuria, polyphagia, or dyspnea. Encouraged to decrease intake of high-sugar foods and beverages  and to increase physical activity.    Orders: -     HM Diabetes Foot Exam -     Empagliflozin; Take 1 tablet (25 mg total) by mouth daily before breakfast.  Dispense: 30 tablet; Refill: 3 -     Glimepiride; Take 1 tablet (4 mg total) by mouth daily with breakfast.  Dispense: 60 tablet; Refill: 3 -      Hemoglobin A1c  Essential hypertension Assessment & Plan: Well-controlled with amlodipine Low-sodium diet with increase physical activity encourage BP Readings from Last 3 Encounters:  10/23/23 138/70  08/16/23 138/74  07/23/23 122/77      Sore throat (viral) Assessment & Plan: No headaches, facial pain or pressure, runny nose, nasal congestion, or cough reported. Complaints of a sore throat that started a few days ago. She is eating and swallowing fine without pain, choking, or regurgitation. The left side of the neck was palpated, and no reactive lymph nodes were felt. Recommended warm saltwater gargles 3-4 times daily to alleviate the sore throat.    Vertigo Assessment & Plan: Chronic dizziness-has had ENT evaluation and vestibular therapy For Vertigo or Dizziness: Timing: Take Meclizine 25 mg per day, divided into 2-3 doses. Dosage: take it at the first sign of vertigo or dizziness General Guidelines: With food: Meclizine can be taken with or without food. Avoid alcohol: Alcohol can increase the sedative effects, so it's best to avoid drinking while on this medication. Side effects: Common side effects include drowsiness, dry mouth, or blurred vision. If these are bothersome, it's important to adjust the dose or timing, as per your doctor's advice.   Orders: -     Meclizine HCl; Take 1 tablet (25 mg total) by mouth 3 (three) times daily.  Dispense: 30 tablet; Refill: 2  Vitamin D deficiency -     VITAMIN D 25 Hydroxy (Vit-D Deficiency, Fractures)  TSH (thyroid-stimulating hormone deficiency) -     TSH + free T4  Other hyperlipidemia -     Lipid panel -     CMP14+EGFR -     CBC with Differential/Platelet   Note: This chart has been completed using Engineer, civil (consulting) software, and while attempts have been made to ensure accuracy, certain words and phrases may not be transcribed as intended.   Follow-up: Return in about 4 months (around 02/20/2024).   Gilmore Laroche, FNP

## 2023-10-26 ENCOUNTER — Telehealth (HOSPITAL_COMMUNITY): Payer: Self-pay

## 2023-10-26 NOTE — Telephone Encounter (Signed)
10/29/23 APPT CONFIRMED BY PT

## 2023-10-29 ENCOUNTER — Encounter (HOSPITAL_COMMUNITY): Payer: Self-pay

## 2023-10-29 ENCOUNTER — Ambulatory Visit (HOSPITAL_COMMUNITY): Payer: Medicare HMO | Admitting: Psychiatry

## 2023-10-29 DIAGNOSIS — E7849 Other hyperlipidemia: Secondary | ICD-10-CM | POA: Diagnosis not present

## 2023-10-29 DIAGNOSIS — E118 Type 2 diabetes mellitus with unspecified complications: Secondary | ICD-10-CM | POA: Diagnosis not present

## 2023-10-29 DIAGNOSIS — E038 Other specified hypothyroidism: Secondary | ICD-10-CM | POA: Diagnosis not present

## 2023-10-29 DIAGNOSIS — E559 Vitamin D deficiency, unspecified: Secondary | ICD-10-CM | POA: Diagnosis not present

## 2023-10-30 LAB — CBC WITH DIFFERENTIAL/PLATELET
Basophils Absolute: 0 10*3/uL (ref 0.0–0.2)
Basos: 0 %
EOS (ABSOLUTE): 0 10*3/uL (ref 0.0–0.4)
Eos: 1 %
Hematocrit: 32 % — ABNORMAL LOW (ref 34.0–46.6)
Hemoglobin: 10.5 g/dL — ABNORMAL LOW (ref 11.1–15.9)
Immature Grans (Abs): 0 10*3/uL (ref 0.0–0.1)
Immature Granulocytes: 0 %
Lymphocytes Absolute: 2.2 10*3/uL (ref 0.7–3.1)
Lymphs: 44 %
MCH: 29.7 pg (ref 26.6–33.0)
MCHC: 32.8 g/dL (ref 31.5–35.7)
MCV: 91 fL (ref 79–97)
Monocytes Absolute: 0.7 10*3/uL (ref 0.1–0.9)
Monocytes: 13 %
Neutrophils Absolute: 2.1 10*3/uL (ref 1.4–7.0)
Neutrophils: 42 %
Platelets: 255 10*3/uL (ref 150–450)
RBC: 3.53 x10E6/uL — ABNORMAL LOW (ref 3.77–5.28)
RDW: 13.3 % (ref 11.7–15.4)
WBC: 5 10*3/uL (ref 3.4–10.8)

## 2023-10-30 LAB — LIPID PANEL
Chol/HDL Ratio: 2.6 ratio (ref 0.0–4.4)
Cholesterol, Total: 148 mg/dL (ref 100–199)
HDL: 56 mg/dL (ref >39)
LDL Chol Calc (NIH): 75 mg/dL (ref 0–99)
Triglycerides: 91 mg/dL (ref 0–149)
VLDL Cholesterol Cal: 17 mg/dL (ref 5–40)

## 2023-10-30 LAB — CMP14+EGFR
ALT: 9 [IU]/L (ref 0–32)
AST: 22 [IU]/L (ref 0–40)
Albumin: 4.3 g/dL (ref 3.7–4.7)
Alkaline Phosphatase: 57 [IU]/L (ref 44–121)
BUN/Creatinine Ratio: 21 (ref 12–28)
BUN: 17 mg/dL (ref 8–27)
Bilirubin Total: 0.6 mg/dL (ref 0.0–1.2)
CO2: 24 mmol/L (ref 20–29)
Calcium: 9.6 mg/dL (ref 8.7–10.3)
Chloride: 104 mmol/L (ref 96–106)
Creatinine, Ser: 0.81 mg/dL (ref 0.57–1.00)
Globulin, Total: 2.9 g/dL (ref 1.5–4.5)
Glucose: 110 mg/dL — ABNORMAL HIGH (ref 70–99)
Potassium: 4.3 mmol/L (ref 3.5–5.2)
Sodium: 143 mmol/L (ref 134–144)
Total Protein: 7.2 g/dL (ref 6.0–8.5)
eGFR: 70 mL/min/{1.73_m2} (ref >59)

## 2023-10-30 LAB — VITAMIN D 25 HYDROXY (VIT D DEFICIENCY, FRACTURES): Vit D, 25-Hydroxy: 42.7 ng/mL (ref 30.0–100.0)

## 2023-10-30 LAB — HEMOGLOBIN A1C
Est. average glucose Bld gHb Est-mCnc: 154 mg/dL
Hgb A1c MFr Bld: 7 % — ABNORMAL HIGH (ref 4.8–5.6)

## 2023-10-30 LAB — TSH+FREE T4
Free T4: 1.09 ng/dL (ref 0.82–1.77)
TSH: 1.38 u[IU]/mL (ref 0.450–4.500)

## 2023-11-01 ENCOUNTER — Encounter: Payer: Self-pay | Admitting: Obstetrics & Gynecology

## 2023-11-01 ENCOUNTER — Ambulatory Visit: Payer: Medicare HMO | Admitting: Obstetrics & Gynecology

## 2023-11-01 ENCOUNTER — Other Ambulatory Visit: Payer: Self-pay | Admitting: Family Medicine

## 2023-11-01 VITALS — BP 150/68 | HR 75

## 2023-11-01 DIAGNOSIS — N813 Complete uterovaginal prolapse: Secondary | ICD-10-CM

## 2023-11-01 DIAGNOSIS — Z4689 Encounter for fitting and adjustment of other specified devices: Secondary | ICD-10-CM

## 2023-11-01 NOTE — Progress Notes (Signed)
Please inform the patient that her hemoglobin A1c is currently 7.0. I recommend that she continue taking Jardiance 25 mg daily and glimepiride 4 mg daily. Additionally, I recommend lifestyle modifications, including decreasing her intake of high sugar foods and beverages and increasing physical activity. All other labs are stable. Wishing her a happy Thanksgiving!

## 2023-11-01 NOTE — Progress Notes (Signed)
Chief Complaint  Patient presents with   Pessary Check    Blood pressure (!) 150/68, pulse 75.  Kimberly Best presents today for routine follow up related to her pessary.   She uses a Milex ring with support #5 She reports no vaginal discharge and no vaginal bleeding   Likert scale(1 not bothersome -5 very bothersome)  :  1  Exam reveals no undue vaginal mucosal pressure of breakdown, no discharge and no vaginal bleeding.  Vaginal Epithelial Abnormality Classification System:   0 0    No abnormalities 1    Epithelial erythema 2    Granulation tissue 3    Epithelial break or erosion, 1 cm or less 4    Epithelial break or erosion, 1 cm or greater  The pessary is removed, cleaned and replaced without difficulty.      ICD-10-CM   1. Pessary maintenance, refit 10/18/20 with Milex ring with support #5  Z46.89     2. Uterovaginal prolapse, complete  N81.3        [name] will be sen back in 3 months for continued follow up.  Lazaro Arms, MD  11/01/2023 10:46 AM

## 2023-11-16 ENCOUNTER — Ambulatory Visit: Payer: Medicare HMO | Admitting: Professional Counselor

## 2023-11-16 DIAGNOSIS — F330 Major depressive disorder, recurrent, mild: Secondary | ICD-10-CM | POA: Diagnosis not present

## 2023-11-16 NOTE — BH Specialist Note (Unsigned)
[facility] Telephone Follow-up  MRN: [id number] NAME: Kimberly Best Date: 11/16/23  Start time: Start Time: 1500 End time: Stop Time: 1535 Total time: Total Time in Minutes (Visit): 35 Call number: Visit Number: 6-Sixth Visit  Reason for call today:  The patient, an 89 year old female, presented for a collaborative care follow-up and reported significant improvement in her depression and anxiety, both of which have decreased to minimal levels. She shared that she has successfully transitioned through a challenging adjustment period and feels that the coping skills she learned during collaborative care have been instrumental in her progress. She has implemented these skills into her daily life and has seen substantial improvements. The patient feels stable, with no recent setbacks, and is maintaining a positive attitude despite ongoing challenges related to family dynamics, aging, and physical limitations. She expressed gratitude for the role collaborative care has played in her success and stated that she feels far less anxious overall. Moving forward, we will transition to monthly follow-ups to focus on relapse prevention and maintaining her improved mood. There are no complications or concerns at this time, and the patient appears motivated and stable.   PHQ-9 Scores:     11/16/2023    3:13 PM 10/05/2023    1:35 PM 09/26/2023    2:56 PM 09/21/2023    1:42 PM 09/06/2023   10:03 AM  Depression screen PHQ 2/9  Decreased Interest 0 0 0 0 0  Down, Depressed, Hopeless 0 0 0 0 0  PHQ - 2 Score 0 0 0 0 0  Altered sleeping 0 0 0 0 0  Tired, decreased energy 0 0 0 0 0  Change in appetite 0 0 0 2 1  Feeling bad or failure about yourself  0 0 0 0 1  Trouble concentrating 0 0 0  1  Moving slowly or fidgety/restless 0 0 0 1 3  Suicidal thoughts 0 0 0 0 0  PHQ-9 Score 0 0 0 3 6  Difficult doing work/chores Somewhat difficult Not difficult at all Not difficult at all Somewhat difficult  Somewhat difficult   GAD-7 Scores:     11/16/2023    3:18 PM 10/05/2023    1:36 PM 09/21/2023    1:43 PM 09/06/2023   10:04 AM  GAD 7 : Generalized Anxiety Score  Nervous, Anxious, on Edge 0 0 0 0  Control/stop worrying 0 0 0 0  Worry too much - different things 0 0 1 1  Trouble relaxing 0 0 0 1  Restless 0 0 0 0  Easily annoyed or irritable 0 0 0 0  Afraid - awful might happen 0 0 1 1  Total GAD 7 Score 0 0 2 3  Anxiety Difficulty Somewhat difficult Somewhat difficult Somewhat difficult Somewhat difficult    Stress Current stressors:  Family Sleep:  Good Appetite:  Good Coping ability:  Good Patient taking medications as prescribed:  Yes  Current medications:  Outpatient Encounter Medications as of 11/16/2023  Medication Sig   ACCU-CHEK AVIVA PLUS test strip USE AS DIRECTED TO TEST TWICE DAILY.   Accu-Chek Softclix Lancets lancets USE AS DIRECTED TO TEST TWICE DAILY.   acetaminophen (TYLENOL) 650 MG CR tablet Take 650 mg by mouth 2 (two) times daily as needed for pain.   amLODipine (NORVASC) 2.5 MG tablet TAKE ONE TABLET (2.5MG  TOTAL) BY MOUTH DAILY   cholecalciferol (VITAMIN D) 1000 UNITS tablet Take 1,000 Units by mouth daily.   clotrimazole-betamethasone (LOTRISONE) cream For up to 14  days and then stop the medication.   empagliflozin (JARDIANCE) 25 MG TABS tablet Take 1 tablet (25 mg total) by mouth daily before breakfast.   glimepiride (AMARYL) 4 MG tablet Take 1 tablet (4 mg total) by mouth daily with breakfast.   glucose blood (ONETOUCH ULTRA) test strip USE AS DIRECTED TO MONITOR BLOOD SUGAR TWICE DAILY.   meclizine (ANTIVERT) 25 MG tablet Take 1 tablet (25 mg total) by mouth 3 (three) times daily.   montelukast (SINGULAIR) 10 MG tablet TAKE ONE TABLET (10MG  TOTAL) BY MOUTH ATBEDTIME   Multiple Vitamins-Minerals (IMMUNE SUPPORT PO) Take 1 tablet by mouth daily.   pantoprazole (PROTONIX) 40 MG tablet TAKE ONE TABLET (40MG ) BY MOUTH DAILY   rosuvastatin (CRESTOR) 5 MG  tablet TAKE ONE (1) TABLET BY MOUTH EVERY DAY   SLOW FE 142 (45 Fe) MG TBCR Take 2 tablets by mouth every morning. (Patient taking differently: Take 1 tablet by mouth every morning.)   triamcinolone (NASACORT) 55 MCG/ACT AERO nasal inhaler Place 2 sprays into the nose daily.   No facility-administered encounter medications on file as of 11/16/2023.     Self-harm Behaviors Risk Assessment Self-harm risk factors:  Aging Patient endorses recent thoughts of harming self:  Denies   Danger to Others Risk Assessment Danger to others risk factors:  None Patient endorses recent thoughts of harming others:  Denies   Substance Use Assessment Patient recently consumed alcohol:  None  Alcohol Use Disorder Identification Test (AUDIT):     09/05/2022    3:45 PM 09/26/2023    2:58 PM  Alcohol Use Disorder Test (AUDIT)  1. How often do you have a drink containing alcohol? 0 0  2. How many drinks containing alcohol do you have on a typical day when you are drinking? 0 0  3. How often do you have six or more drinks on one occasion? 0 0  AUDIT-C Score 0 0   Goals, Interventions and Follow-up Plan Goals: Maintain improved mood. Relapse prevention.  Interventions: Behavioral Activation and CBT Cognitive Behavioral Therapy Follow-up Plan:  Monthly visits   Reuel Boom

## 2023-11-19 ENCOUNTER — Other Ambulatory Visit: Payer: Self-pay | Admitting: Family Medicine

## 2023-11-19 DIAGNOSIS — E118 Type 2 diabetes mellitus with unspecified complications: Secondary | ICD-10-CM

## 2023-11-19 DIAGNOSIS — I10 Essential (primary) hypertension: Secondary | ICD-10-CM

## 2023-11-20 ENCOUNTER — Ambulatory Visit (HOSPITAL_COMMUNITY): Payer: Medicare HMO | Admitting: Psychiatry

## 2023-11-28 ENCOUNTER — Other Ambulatory Visit: Payer: Self-pay | Admitting: Family Medicine

## 2023-11-28 DIAGNOSIS — E118 Type 2 diabetes mellitus with unspecified complications: Secondary | ICD-10-CM

## 2023-11-30 ENCOUNTER — Encounter (HOSPITAL_COMMUNITY): Payer: Self-pay

## 2023-11-30 ENCOUNTER — Ambulatory Visit (HOSPITAL_COMMUNITY)
Admission: RE | Admit: 2023-11-30 | Discharge: 2023-11-30 | Disposition: A | Discharge status: Home / Self Care | Payer: Medicare HMO | Source: Ambulatory Visit | Attending: Family Medicine | Admitting: Family Medicine

## 2023-11-30 DIAGNOSIS — Z1231 Encounter for screening mammogram for malignant neoplasm of breast: Secondary | ICD-10-CM | POA: Insufficient documentation

## 2023-11-30 HISTORY — DX: Personal history of irradiation: Z92.3

## 2023-12-10 ENCOUNTER — Other Ambulatory Visit: Payer: Self-pay | Admitting: Family Medicine

## 2023-12-10 DIAGNOSIS — E118 Type 2 diabetes mellitus with unspecified complications: Secondary | ICD-10-CM

## 2023-12-10 MED ORDER — ONETOUCH ULTRA VI STRP: ORAL_STRIP | 10 refills | Status: DC

## 2023-12-14 ENCOUNTER — Other Ambulatory Visit: Payer: Self-pay | Admitting: Family Medicine

## 2023-12-14 DIAGNOSIS — K219 Gastro-esophageal reflux disease without esophagitis: Secondary | ICD-10-CM

## 2023-12-17 ENCOUNTER — Other Ambulatory Visit: Payer: Self-pay | Admitting: Family Medicine

## 2023-12-17 DIAGNOSIS — E118 Type 2 diabetes mellitus with unspecified complications: Secondary | ICD-10-CM

## 2023-12-17 MED ORDER — ONETOUCH ULTRA VI STRP: ORAL_STRIP | 10 refills | Status: AC

## 2023-12-17 NOTE — Telephone Encounter (Signed)
 Copied from CRM [id number]. Topic: Clinical - Medication Refill >> Dec 17, 2023 12:03 PM Powell HERO wrote: Most Recent Primary Care Visit:  Provider: REIDA REDELL PARAS  Department: [facility]  Visit Type: INTEGRATED BH FOLLOW UP  Date: 11/16/2023  Medication: glucose blood (ONETOUCH ULTRA)   Has the patient contacted their pharmacy? Yes Pharmacy needs prescription sent over that states patient needs to test blood 2x a day  Is this the correct pharmacy for this prescription? Yes If no, delete pharmacy and type the correct one.  This is the patient's preferred pharmacy:  [facility] - [place], [place] - [address] [place] [address] Phone: [phone] Fax: [phone]    Has the prescription been filled recently? No  Is the patient out of the medication? Yes  Has the patient been seen for an appointment in the last year OR does the patient have an upcoming appointment? Yes  Can we respond through MyChart? No  Agent: Please be advised that Rx refills may take up to 3 business days. We ask that you follow-up with your pharmacy.

## 2023-12-18 ENCOUNTER — Ambulatory Visit (HOSPITAL_COMMUNITY): Payer: Medicare HMO | Admitting: Psychiatry

## 2023-12-18 NOTE — Telephone Encounter (Signed)
 Copied from CRM [id number]. Topic: Clinical - Medication Refill >> Dec 17, 2023 12:03 PM Powell HERO wrote: Most Recent Primary Care Visit:  Provider: REIDA REDELL PARAS  Department: [facility]  Visit Type: INTEGRATED BH FOLLOW UP  Date: 11/16/2023  Medication: glucose blood (ONETOUCH ULTRA)  Has the patient contacted their pharmacy? Yes Pharmacy needs a prescription sent over that states the patient needs to test blood twice daily  Is this the correct pharmacy for this prescription? Yes If no, delete pharmacy and type the correct one.  This is the patient's preferred pharmacy:  [facility] - [place], [place] - [address] [place] [address] Phone: [phone] Fax: [phone]    Has the prescription been filled recently? No  Is the patient out of the medication? Yes  Has the patient been seen for an appointment in the last year OR does the patient have an upcoming appointment? Yes  Can we respond through MyChart? No phone  Agent: Please be advised that Rx refills may take up to 3 business days. We ask that you follow-up with your pharmacy.

## 2024-01-07 ENCOUNTER — Encounter: Payer: Self-pay | Admitting: Obstetrics & Gynecology

## 2024-01-07 ENCOUNTER — Ambulatory Visit: Payer: Medicare HMO | Admitting: Obstetrics & Gynecology

## 2024-01-07 VITALS — BP 151/78 | HR 82 | Ht <= 58 in | Wt 177.0 lb

## 2024-01-07 DIAGNOSIS — N813 Complete uterovaginal prolapse: Secondary | ICD-10-CM | POA: Diagnosis not present

## 2024-01-07 DIAGNOSIS — Z4689 Encounter for fitting and adjustment of other specified devices: Secondary | ICD-10-CM | POA: Diagnosis not present

## 2024-01-07 NOTE — Progress Notes (Signed)
Chief Complaint  Patient presents with   pessary maintenance    Blood pressure (!) 151/78, pulse 82, height 4\' 8"  (1.422 m), weight 177 lb (80.3 kg).  Kimberly Best presents today for routine follow up related to her pessary.   She uses a Milex rign with support #5 She reports no vaginal discharge and no vaginal bleeding   Likert scale(1 not bothersome -5 very bothersome)  :  1  Exam reveals no undue vaginal mucosal pressure of breakdown, no discharge and no vaginal bleeding.  Vaginal Epithelial Abnormality Classification System:   0 0    No abnormalities 1    Epithelial erythema 2    Granulation tissue 3    Epithelial break or erosion, 1 cm or less 4    Epithelial break or erosion, 1 cm or greater  The pessary is removed, cleaned and replaced without difficulty.      ICD-10-CM   1. Pessary maintenance, refit 10/18/20 with Milex ring with support #5  Z46.89     2. Uterovaginal prolapse, complete  N81.3        Kimberly Best will be sen back in 3 months for continued follow up.  Lazaro Arms, MD  01/07/2024 8:59 AM

## 2024-01-09 DIAGNOSIS — H1045 Other chronic allergic conjunctivitis: Secondary | ICD-10-CM | POA: Diagnosis not present

## 2024-01-09 DIAGNOSIS — H02834 Dermatochalasis of left upper eyelid: Secondary | ICD-10-CM | POA: Diagnosis not present

## 2024-01-09 DIAGNOSIS — H0102B Squamous blepharitis left eye, upper and lower eyelids: Secondary | ICD-10-CM | POA: Diagnosis not present

## 2024-01-09 DIAGNOSIS — E119 Type 2 diabetes mellitus without complications: Secondary | ICD-10-CM | POA: Diagnosis not present

## 2024-01-09 DIAGNOSIS — H02831 Dermatochalasis of right upper eyelid: Secondary | ICD-10-CM | POA: Diagnosis not present

## 2024-01-09 DIAGNOSIS — H0102A Squamous blepharitis right eye, upper and lower eyelids: Secondary | ICD-10-CM | POA: Diagnosis not present

## 2024-02-22 ENCOUNTER — Ambulatory Visit (INDEPENDENT_AMBULATORY_CARE_PROVIDER_SITE_OTHER): Payer: Medicare HMO | Admitting: Family Medicine

## 2024-02-22 ENCOUNTER — Encounter: Payer: Self-pay | Admitting: Family Medicine

## 2024-02-22 VITALS — BP 144/77 | HR 77 | Resp 16 | Ht <= 58 in | Wt 175.1 lb

## 2024-02-22 DIAGNOSIS — E038 Other specified hypothyroidism: Secondary | ICD-10-CM

## 2024-02-22 DIAGNOSIS — E1169 Type 2 diabetes mellitus with other specified complication: Secondary | ICD-10-CM | POA: Diagnosis not present

## 2024-02-22 DIAGNOSIS — E559 Vitamin D deficiency, unspecified: Secondary | ICD-10-CM | POA: Diagnosis not present

## 2024-02-22 DIAGNOSIS — R7301 Impaired fasting glucose: Secondary | ICD-10-CM

## 2024-02-22 DIAGNOSIS — I10 Essential (primary) hypertension: Secondary | ICD-10-CM | POA: Diagnosis not present

## 2024-02-22 DIAGNOSIS — E785 Hyperlipidemia, unspecified: Secondary | ICD-10-CM

## 2024-02-22 MED ORDER — AMLODIPINE BESYLATE 5 MG PO TABS: 5.0000 mg | ORAL_TABLET | Freq: Every day | ORAL | 1 refills | Status: DC

## 2024-02-22 NOTE — Patient Instructions (Addendum)
 I appreciate the opportunity to provide care to you today!    Follow up: 1 months for BP  Labs: please stop by the lab today to get your blood drawn (CBC, CMP, TSH, Lipid profile, HgA1c, Vit D)  Hypertension Management  Your current blood pressure is above the target goal of <140/90 mmHg. To address this, please continue taking amlodipine 5 mg daily   Medication Instructions: Take your blood pressure medication at the same time each day. After taking your medication, check your blood pressure at least an hour later. If your first reading is >140/90 mmHg, wait at least 10 minutes and recheck your blood pressure. Side Effects: In the initial days of therapy, you may experience dizziness or lightheadedness as your body adjusts to the lower blood pressure; this is expected. Diet and Lifestyle: Adhere to a low-sodium diet, limiting intake to less than 1500 mg daily, and increase your physical activity. Avoid over-the-counter NSAIDs such as ibuprofen and naproxen while on this medication. Hydration and Nutrition: Stay well-hydrated by drinking at least 64 ounces of water daily. Increase your servings of fruits and vegetables and avoid excessive sodium in your diet. Long-Term Considerations: Uncontrolled hypertension can increase the risk of cardiovascular diseases, including stroke, coronary artery disease, and heart failure.     Please continue to a heart-healthy diet and increase your physical activities. Try to exercise for at least five days a week.    It was a pleasure to see you and I look forward to continuing to work together on your health and well-being. Please do not hesitate to call the office if you need care or have questions about your care.  In case of emergency, please visit the Emergency Department for urgent care, or contact our clinic at [phone] to schedule an appointment. We're here to help you!   Have a wonderful day and week. With Gratitude, Gilmore Laroche  MSN, FNP-BC

## 2024-02-22 NOTE — Progress Notes (Signed)
 Established Patient Office Visit  Subjective:  Patient ID: Kimberly Best, female    DOB: 03/24/1933  Age: 89 y.o. MRN: [id number]  CC:  Chief Complaint  Patient presents with   Diabetes    4 month follow up   Hypertension    HPI Kimberly Best is a 89 y.o. female with past medical history of hypertension, type 2 diabetes presents for f/u of  chronic medical conditions. For the details of today's visit, please refer to the assessment and plan.    Past Medical History:  Diagnosis Date   Allergy    Anxiety    Arthritis    Cancer (HCC)    right breast cancer   Diabetes mellitus without complication (HCC)    GERD (gastroesophageal reflux disease)    H. pylori infection 05/2020   S/p treatment with Pylera.  H. pylori stool antigen negative on 12/21/2020.   HLD (hyperlipidemia)    Hypertension    IDA (iron deficiency anemia)    Hg in 9-10 range since September 2019.   Personal history of radiation therapy    Pessary maintenance 03/10/2015    Past Surgical History:  Procedure Laterality Date   BIOPSY  05/26/2020   Procedure: BIOPSY;  Surgeon: Corbin Ade, MD;  Location: [facility];  Service: Endoscopy;;   BREAST BIOPSY Left 2013   fribrocystic change with calcs   BREAST LUMPECTOMY Right    CHOLECYSTECTOMY     COLONOSCOPY N/A 05/26/2020   Procedure: COLONOSCOPY;  Surgeon: Corbin Ade, MD; Diverticulosis in sigmoid colon, friable colonic mucosa likely secondary to ASA/NSAID insult, exam was otherwise normal.     ESOPHAGOGASTRODUODENOSCOPY N/A 05/26/2020   Procedure: ESOPHAGOGASTRODUODENOSCOPY (EGD);  Surgeon: Corbin Ade, MD;  Normal esophagus s/p dilation, mucosal changes in the stomach biopsied, normal examined duodenum.  Pathology revealed H. pylori.   EYE SURGERY     GIVENS CAPSULE STUDY N/A 02/16/2021   Procedure: GIVENS CAPSULE STUDY;  Surgeon: Corbin Ade, MD;  gastric and duodenal bulbar erosions, 2 nonbleeding small bowel ulcers with benign  appearance suspected to be secondary to chronic aspirin use.    Family History  Problem Relation Age of Onset   Cancer Father    Stroke Mother    Hypertension Mother    Diabetes Sister    Hypertension Sister    Cancer Brother    Hypertension Brother    Bipolar disorder Daughter    Hypertension Maternal Grandmother    Tuberculosis Paternal Grandmother    Hypertension Paternal Grandmother    Alcohol abuse Paternal Grandfather    Hypertension Paternal Grandfather    Colon cancer Neg Hx     Social History   Socioeconomic History   Marital status: Widowed    Spouse name: Not on file   Number of children: Not on file   Years of education: Not on file   Highest education level: Not on file  Occupational History   Not on file  Tobacco Use   Smoking status: Never   Smokeless tobacco: Never  Vaping Use   Vaping status: Never Used  Substance and Sexual Activity   Alcohol use: Not Currently    Comment: Couple ounces of champagne once every couple of weeks or less.   Drug use: No   Sexual activity: Not Currently    Birth control/protection: Post-menopausal, Abstinence  Other Topics Concern   Not on file  Social History Narrative   Widow since 2015,married for 56 years.Lives alone.Retired.   Social  Drivers of Health   Financial Resource Strain: Medium Risk (09/26/2023)   Overall Financial Resource Strain (CARDIA)    Difficulty of Paying Living Expenses: Somewhat hard  Food Insecurity: No Food Insecurity (09/26/2023)   Hunger Vital Sign    Worried About Running Out of Food in the Last Year: Never true    Ran Out of Food in the Last Year: Never true  Transportation Needs: No Transportation Needs (09/26/2023)   PRAPARE - Administrator, Civil Service (Medical): No    Lack of Transportation (Non-Medical): No  Physical Activity: Insufficiently Active (09/26/2023)   Exercise Vital Sign    Days of Exercise per Week: 3 days    Minutes of Exercise per Session: 30  min  Stress: No Stress Concern Present (09/26/2023)   [employer] of Occupational Health - Occupational Stress Questionnaire    Feeling of Stress : Only a little  Social Connections: Unknown (09/26/2023)   Social Connection and Isolation Panel [NHANES]    Frequency of Communication with Friends and Family: More than three times a week    Frequency of Social Gatherings with Friends and Family: More than three times a week    Attends Religious Services: More than 4 times per year    Active Member of [employer] or Organizations: No    Attends Banker Meetings: Never    Marital Status: Patient declined  Catering manager Violence: Not At Risk (09/26/2023)   Humiliation, Afraid, Rape, and Kick questionnaire    Fear of Current or Ex-Partner: No    Emotionally Abused: No    Physically Abused: No    Sexually Abused: No    Outpatient Medications Prior to Visit  Medication Sig Dispense Refill   ACCU-CHEK AVIVA PLUS test strip USE AS DIRECTED TO TEST TWICE DAILY. 100 strip 0   Accu-Chek Softclix Lancets lancets USE AS DIRECTED TO TEST TWICE DAILY. 100 each 0   acetaminophen (TYLENOL) 650 MG CR tablet Take 650 mg by mouth 2 (two) times daily as needed for pain.     cholecalciferol (VITAMIN D) 1000 UNITS tablet Take 1,000 Units by mouth daily.     clotrimazole-betamethasone (LOTRISONE) cream For up to 14 days and then stop the medication. 45 g 0   empagliflozin (JARDIANCE) 25 MG TABS tablet Take 1 tablet (25 mg total) by mouth daily before breakfast. 30 tablet 3   glimepiride (AMARYL) 4 MG tablet Take 1 tablet (4 mg total) by mouth daily with breakfast. 60 tablet 3   glucose blood (ONETOUCH ULTRA) test strip USE DAILY AS DIRECTED 100 each 10   meclizine (ANTIVERT) 25 MG tablet Take 1 tablet (25 mg total) by mouth 3 (three) times daily. 30 tablet 2   montelukast (SINGULAIR) 10 MG tablet TAKE ONE TABLET (10MG  TOTAL) BY MOUTH ATBEDTIME 30 tablet 3   Multiple Vitamins-Minerals (IMMUNE  SUPPORT PO) Take 1 tablet by mouth daily.     pantoprazole (PROTONIX) 40 MG tablet TAKE ONE TABLET (40MG ) BY MOUTH DAILY 90 tablet 0   rosuvastatin (CRESTOR) 5 MG tablet TAKE ONE (1) TABLET BY MOUTH EVERY DAY 90 tablet 1   SLOW FE 142 (45 Fe) MG TBCR Take 2 tablets by mouth every morning. (Patient taking differently: Take 1 tablet by mouth every morning.) 180 tablet 1   triamcinolone (NASACORT) 55 MCG/ACT AERO nasal inhaler Place 2 sprays into the nose daily. 1 each 12   amLODipine (NORVASC) 2.5 MG tablet TAKE ONE TABLET (2.5MG  TOTAL) BY MOUTH DAILY 90 tablet  1   No facility-administered medications prior to visit.    Allergies  Allergen Reactions   Lisinopril Other (See Comments)    Tongue swelling   Penicillins Swelling    Has patient had a PCN reaction causing immediate rash, facial/tongue/throat swelling, SOB or lightheadedness with hypotension: Yes Has patient had a PCN reaction causing severe rash involving mucus membranes or skin necrosis: No Has patient had a PCN reaction that required hospitalization: No Has patient had a PCN reaction occurring within the last 10 years: No If all of the above answers are "NO", then may proceed with Cephalosporin use.    ROS Review of Systems  Constitutional:  Negative for chills and fever.  Eyes:  Negative for visual disturbance.  Respiratory:  Negative for chest tightness and shortness of breath.   Neurological:  Negative for dizziness and headaches.      Objective:    Physical Exam HENT:     Head: Normocephalic.     Mouth/Throat:     Mouth: Mucous membranes are moist.  Cardiovascular:     Rate and Rhythm: Normal rate.     Heart sounds: Normal heart sounds.  Pulmonary:     Effort: Pulmonary effort is normal.     Breath sounds: Normal breath sounds.  Neurological:     Mental Status: She is alert.     BP (!) 144/77   Pulse 77   Resp 16   Ht 4\' 8"  (1.422 m)   Wt 175 lb 1.9 oz (79.4 kg)   SpO2 97%   BMI 39.26 kg/m  Wt  Readings from Last 3 Encounters:  02/22/24 175 lb 1.9 oz (79.4 kg)  01/07/24 177 lb (80.3 kg)  10/23/23 179 lb 1.9 oz (81.2 kg)    Lab Results  Component Value Date   TSH 1.230 02/22/2024   Lab Results  Component Value Date   WBC 3.9 02/22/2024   HGB 11.5 02/22/2024   HCT 35.2 02/22/2024   MCV 93 02/22/2024   PLT 268 02/22/2024   Lab Results  Component Value Date   NA 144 02/22/2024   K 4.3 02/22/2024   CO2 24 02/22/2024   GLUCOSE 100 (H) 02/22/2024   BUN 15 02/22/2024   CREATININE 0.76 02/22/2024   BILITOT 0.4 02/22/2024   ALKPHOS 72 02/22/2024   AST 20 02/22/2024   ALT 10 02/22/2024   PROT 7.5 02/22/2024   ALBUMIN 4.5 02/22/2024   CALCIUM 9.3 02/22/2024   ANIONGAP 9 07/18/2021   EGFR 75 02/22/2024   Lab Results  Component Value Date   CHOL 150 02/22/2024   Lab Results  Component Value Date   HDL 49 02/22/2024   Lab Results  Component Value Date   LDLCALC 83 02/22/2024   Lab Results  Component Value Date   TRIG 94 02/22/2024   Lab Results  Component Value Date   CHOLHDL 3.1 02/22/2024   Lab Results  Component Value Date   HGBA1C 6.4 (H) 02/22/2024      Assessment & Plan:  Essential hypertension Assessment & Plan: Blood pressure is currently uncontrolled in the clinic today, but she is asymptomatic. The patient reports elevated blood pressure at home. She currently takes amlodipine 2.5 mg daily. Therapy will be initiated with amlodipine 5 mg daily. She is advised to monitor her blood pressure at home and bring her ambulatory readings to her next appointment. She is encouraged to report to the emergency department for blood pressure greater than 180/120 with symptoms such as headache, dizziness,  blurred vision, chest pain, or palpitations. A low-sodium diet and increased physical activity are encouraged. The patient verbalized understanding and is aware of the plan of care  BP Readings from Last 3 Encounters:  02/22/24 (!) 144/77  01/07/24 (!)  151/78  11/01/23 (!) 150/68     Orders: -     amLODIPine Besylate; Take 1 tablet (5 mg total) by mouth daily.  Dispense: 30 tablet; Refill: 1  Hyperlipidemia LDL goal <100 Assessment & Plan: The patient was encouraged to continue taking Rosuvastatin 5 mg daily for cholesterol management. Lifestyle modifications were also discussed, including avoiding simple carbohydrates such as cakes, sweet desserts, ice cream, soda (diet or regular), sweet tea, candies, chips, cookies, store-bought juices, excessive alcohol (more than 1-2 drinks per day), lemonade, artificial sweeteners, donuts, coffee creamers, and sugar-free products. Additionally, the patient was advised to reduce the consumption of greasy, fatty foods and increase physical activity to support cardiovascular health. The patient verbalized understanding and is aware of the plan of care.   Orders: -     Lipid panel -     CMP14+EGFR -     CBC with Differential/Platelet  IFG (impaired fasting glucose) -     Hemoglobin A1c  Vitamin D deficiency -     VITAMIN D 25 Hydroxy (Vit-D Deficiency, Fractures)  TSH (thyroid-stimulating hormone deficiency) -     TSH + free T4  Note: This chart has been completed using Engineer, civil (consulting) software, and while attempts have been made to ensure accuracy, certain words and phrases may not be transcribed as intended.    Follow-up: Return in about 1 month (around 03/24/2024).   Gilmore Laroche, FNP

## 2024-02-23 LAB — CBC WITH DIFFERENTIAL/PLATELET
Basophils Absolute: 0 10*3/uL (ref 0.0–0.2)
Basos: 0 %
EOS (ABSOLUTE): 0.1 10*3/uL (ref 0.0–0.4)
Eos: 2 %
Hematocrit: 35.2 % (ref 34.0–46.6)
Hemoglobin: 11.5 g/dL (ref 11.1–15.9)
Immature Grans (Abs): 0 10*3/uL (ref 0.0–0.1)
Immature Granulocytes: 0 %
Lymphocytes Absolute: 1.8 10*3/uL (ref 0.7–3.1)
Lymphs: 47 %
MCH: 30.3 pg (ref 26.6–33.0)
MCHC: 32.7 g/dL (ref 31.5–35.7)
MCV: 93 fL (ref 79–97)
Monocytes Absolute: 0.5 10*3/uL (ref 0.1–0.9)
Monocytes: 14 %
Neutrophils Absolute: 1.4 10*3/uL (ref 1.4–7.0)
Neutrophils: 37 %
Platelets: 268 10*3/uL (ref 150–450)
RBC: 3.8 x10E6/uL (ref 3.77–5.28)
RDW: 14.2 % (ref 11.7–15.4)
WBC: 3.9 10*3/uL (ref 3.4–10.8)

## 2024-02-23 LAB — LIPID PANEL
Chol/HDL Ratio: 3.1 ratio (ref 0.0–4.4)
Cholesterol, Total: 150 mg/dL (ref 100–199)
HDL: 49 mg/dL (ref >39)
LDL Chol Calc (NIH): 83 mg/dL (ref 0–99)
Triglycerides: 94 mg/dL (ref 0–149)
VLDL Cholesterol Cal: 18 mg/dL (ref 5–40)

## 2024-02-23 LAB — TSH+FREE T4
Free T4: 1.13 ng/dL (ref 0.82–1.77)
TSH: 1.23 u[IU]/mL (ref 0.450–4.500)

## 2024-02-23 LAB — CMP14+EGFR
ALT: 10 IU/L (ref 0–32)
AST: 20 IU/L (ref 0–40)
Albumin: 4.5 g/dL (ref 3.7–4.7)
Alkaline Phosphatase: 72 IU/L (ref 44–121)
BUN/Creatinine Ratio: 20 (ref 12–28)
BUN: 15 mg/dL (ref 8–27)
Bilirubin Total: 0.4 mg/dL (ref 0.0–1.2)
CO2: 24 mmol/L (ref 20–29)
Calcium: 9.3 mg/dL (ref 8.7–10.3)
Chloride: 104 mmol/L (ref 96–106)
Creatinine, Ser: 0.76 mg/dL (ref 0.57–1.00)
Globulin, Total: 3 g/dL (ref 1.5–4.5)
Glucose: 100 mg/dL — ABNORMAL HIGH (ref 70–99)
Potassium: 4.3 mmol/L (ref 3.5–5.2)
Sodium: 144 mmol/L (ref 134–144)
Total Protein: 7.5 g/dL (ref 6.0–8.5)
eGFR: 75 mL/min/{1.73_m2} (ref >59)

## 2024-02-23 LAB — HEMOGLOBIN A1C
Est. average glucose Bld gHb Est-mCnc: 137 mg/dL
Hgb A1c MFr Bld: 6.4 % — ABNORMAL HIGH (ref 4.8–5.6)

## 2024-02-23 LAB — VITAMIN D 25 HYDROXY (VIT D DEFICIENCY, FRACTURES): Vit D, 25-Hydroxy: 42.4 ng/mL (ref 30.0–100.0)

## 2024-02-23 NOTE — Progress Notes (Signed)
 Please inform the patient that her HbA1c has decreased to 6.4. Keep up the great efforts and continue the current treatment regimen. All other lab results are stable.

## 2024-02-23 NOTE — Assessment & Plan Note (Signed)
 The patient was encouraged to continue taking Rosuvastatin 5 mg daily for cholesterol management. Lifestyle modifications were also discussed, including avoiding simple carbohydrates such as cakes, sweet desserts, ice cream, soda (diet or regular), sweet tea, candies, chips, cookies, store-bought juices, excessive alcohol (more than 1-2 drinks per day), lemonade, artificial sweeteners, donuts, coffee creamers, and sugar-free products. Additionally, the patient was advised to reduce the consumption of greasy, fatty foods and increase physical activity to support cardiovascular health. The patient verbalized understanding and is aware of the plan of care.

## 2024-02-23 NOTE — Assessment & Plan Note (Signed)
 Blood pressure is currently uncontrolled in the clinic today, but she is asymptomatic. The patient reports elevated blood pressure at home. She currently takes amlodipine 2.5 mg daily. Therapy will be initiated with amlodipine 5 mg daily. She is advised to monitor her blood pressure at home and bring her ambulatory readings to her next appointment. She is encouraged to report to the emergency department for blood pressure greater than 180/120 with symptoms such as headache, dizziness, blurred vision, chest pain, or palpitations. A low-sodium diet and increased physical activity are encouraged. The patient verbalized understanding and is aware of the plan of care  BP Readings from Last 3 Encounters:  02/22/24 (!) 144/77  01/07/24 (!) 151/78  11/01/23 (!) 150/68

## 2024-03-05 ENCOUNTER — Other Ambulatory Visit: Payer: Self-pay | Admitting: Family Medicine

## 2024-03-05 DIAGNOSIS — E118 Type 2 diabetes mellitus with unspecified complications: Secondary | ICD-10-CM

## 2024-03-05 NOTE — Telephone Encounter (Signed)
 Copied from CRM [id number]. Topic: Clinical - Medication Refill >> Mar 05, 2024  2:53 PM Almira Coaster wrote: Most Recent Primary Care Visit:  Provider: Gilmore Laroche  Department: [facility]  Visit Type: OFFICE VISIT  Date: 02/22/2024  Medication: glimepiride (AMARYL) 4 MG tablet & empagliflozin (JARDIANCE) 25 MG TABS tablet requesting 90-100 day supply  Has the patient contacted their pharmacy? No, Monia Pouch is calling to submit request (Agent: If no, request that the patient contact the pharmacy for the refill. If patient does not wish to contact the pharmacy document the reason why and proceed with request.) (Agent: If yes, when and what did the pharmacy advise?)  Is this the correct pharmacy for this prescription? Yes If no, delete pharmacy and type the correct one.  This is the patient's preferred pharmacy:  [facility] - [place], [place] - [address] [place] [address] Phone: [phone] Fax: [phone]    Has the prescription been filled recently? No  Is the patient out of the medication? No  Has the patient been seen for an appointment in the last year OR does the patient have an upcoming appointment? Yes  Can we respond through MyChart? No  Agent: Please be advised that Rx refills may take up to 3 business days. We ask that you follow-up with your pharmacy.

## 2024-03-06 MED ORDER — GLIMEPIRIDE 4 MG PO TABS: 4.0000 mg | ORAL_TABLET | Freq: Every day | ORAL | 3 refills | Status: DC

## 2024-03-06 MED ORDER — EMPAGLIFLOZIN 25 MG PO TABS: 25.0000 mg | ORAL_TABLET | Freq: Every day | ORAL | 3 refills | Status: DC

## 2024-03-19 ENCOUNTER — Other Ambulatory Visit: Payer: Self-pay | Admitting: Family Medicine

## 2024-03-19 DIAGNOSIS — K219 Gastro-esophageal reflux disease without esophagitis: Secondary | ICD-10-CM

## 2024-03-24 ENCOUNTER — Ambulatory Visit

## 2024-03-24 NOTE — Progress Notes (Signed)
 Patient is in office today for a nurse visit for Blood Pressure Check. Patient blood pressure was 135/78, Patient No chest pain, No shortness of breath, No dyspnea on exertion, No orthopnea, No paroxysmal nocturnal dyspnea, No edema, No palpitations, No syncope

## 2024-03-27 ENCOUNTER — Encounter: Payer: Self-pay | Admitting: Obstetrics & Gynecology

## 2024-03-27 ENCOUNTER — Ambulatory Visit: Admitting: Obstetrics & Gynecology

## 2024-03-27 VITALS — BP 146/74 | HR 76

## 2024-03-27 DIAGNOSIS — N813 Complete uterovaginal prolapse: Secondary | ICD-10-CM | POA: Diagnosis not present

## 2024-03-27 DIAGNOSIS — Z4689 Encounter for fitting and adjustment of other specified devices: Secondary | ICD-10-CM | POA: Diagnosis not present

## 2024-03-27 NOTE — Progress Notes (Signed)
 Chief Complaint  Patient presents with   Pessary Check    Blood pressure (!) 146/74, pulse 76.  Kimberly Best presents today for routine follow up related to her pessary.   She uses a Milex ring with support #5 She reports no vaginal discharge and no vaginal bleeding   Likert scale(1 not bothersome -5 very bothersome)  :  1  Exam reveals no undue vaginal mucosal pressure of breakdown, no discharge and little vaginal bleeding.  Vaginal Epithelial Abnormality Classification System:   0 0    No abnormalities 1    Epithelial erythema 2    Granulation tissue 3    Epithelial break or erosion, 1 cm or less 4    Epithelial break or erosion, 1 cm or greater  The pessary is removed, cleaned and replaced without difficulty.      ICD-10-CM   1. Pessary maintenance, refit 10/18/20 with Milex ring with support #5  Z46.89     2. Uterovaginal prolapse, complete  N81.3        SALEM MASTROGIOVANNI will be sen back in 4 months for continued follow up.  Wendelyn Halter, MD  03/27/2024 10:40 AM

## 2024-04-04 ENCOUNTER — Other Ambulatory Visit: Payer: Self-pay | Admitting: Family Medicine

## 2024-04-04 DIAGNOSIS — K219 Gastro-esophageal reflux disease without esophagitis: Secondary | ICD-10-CM

## 2024-04-07 ENCOUNTER — Other Ambulatory Visit: Payer: Self-pay | Admitting: Family Medicine

## 2024-04-07 DIAGNOSIS — R42 Dizziness and giddiness: Secondary | ICD-10-CM

## 2024-04-23 ENCOUNTER — Other Ambulatory Visit: Payer: Self-pay | Admitting: Family Medicine

## 2024-04-23 DIAGNOSIS — I10 Essential (primary) hypertension: Secondary | ICD-10-CM

## 2024-05-12 ENCOUNTER — Encounter: Payer: Self-pay | Admitting: Family Medicine

## 2024-05-12 ENCOUNTER — Ambulatory Visit (INDEPENDENT_AMBULATORY_CARE_PROVIDER_SITE_OTHER): Payer: Self-pay | Admitting: Family Medicine

## 2024-05-12 VITALS — BP 136/82 | HR 87 | Ht <= 58 in | Wt 173.1 lb

## 2024-05-12 DIAGNOSIS — J302 Other seasonal allergic rhinitis: Secondary | ICD-10-CM | POA: Diagnosis not present

## 2024-05-12 DIAGNOSIS — R7301 Impaired fasting glucose: Secondary | ICD-10-CM

## 2024-05-12 DIAGNOSIS — M25472 Effusion, left ankle: Secondary | ICD-10-CM | POA: Diagnosis not present

## 2024-05-12 DIAGNOSIS — E559 Vitamin D deficiency, unspecified: Secondary | ICD-10-CM

## 2024-05-12 DIAGNOSIS — E7849 Other hyperlipidemia: Secondary | ICD-10-CM

## 2024-05-12 DIAGNOSIS — K219 Gastro-esophageal reflux disease without esophagitis: Secondary | ICD-10-CM | POA: Diagnosis not present

## 2024-05-12 DIAGNOSIS — M25471 Effusion, right ankle: Secondary | ICD-10-CM | POA: Insufficient documentation

## 2024-05-12 DIAGNOSIS — E038 Other specified hypothyroidism: Secondary | ICD-10-CM

## 2024-05-12 DIAGNOSIS — M1612 Unilateral primary osteoarthritis, left hip: Secondary | ICD-10-CM | POA: Insufficient documentation

## 2024-05-12 MED ORDER — MONTELUKAST SODIUM 10 MG PO TABS: 10.0000 mg | ORAL_TABLET | Freq: Every day | ORAL | 2 refills | Status: DC

## 2024-05-12 MED ORDER — FUROSEMIDE 20 MG PO TABS
20.0000 mg | ORAL_TABLET | Freq: Every day | ORAL | 0 refills | Status: AC
Start: 2024-05-12 — End: 2024-05-26

## 2024-05-12 MED ORDER — POTASSIUM CHLORIDE CRYS ER 10 MEQ PO TBCR: 10.0000 meq | EXTENDED_RELEASE_TABLET | Freq: Two times a day (BID) | ORAL | 0 refills | Status: DC

## 2024-05-12 NOTE — Assessment & Plan Note (Addendum)
 The patient has a chronic condition and would benefit from an adjustable bed, as adjustable positions can help relieve pressure and pain associated with arthritis, and allow the patient to get in and out of bed more easily and change positions independently.

## 2024-05-12 NOTE — Assessment & Plan Note (Addendum)
 The patient, with a longstanding history of GERD, would benefit from an adjustable bed. Elevating the head can help prevent acid reflux and nighttime heartburn, thereby improving sleep quality.

## 2024-05-12 NOTE — Progress Notes (Signed)
 Established Patient Office Visit  Subjective:  Patient ID: Kimberly Best, female    DOB: [birth year]  Age: 89 y.o. MRN: [id number]  CC:  Chief Complaint  Patient presents with   Medical Management of Chronic Issues    Follow up   Leg Swelling    Pt reports sx of leg swelling, has a sensation like something is crawling on her legs.    seasonal allergies    Pt reports sx of seasonal allergies.     HPI Kimberly Best is a 89 y.o. female with past medical history of GERD, Osteoarthritis of left hip, seasonal allergies presents for f/u of  chronic medical conditions with the above complaints. For the details of today's visit, please refer to the assessment and plan.      Left leg  Past Medical History:  Diagnosis Date   Allergy    Anxiety    Arthritis    Cancer (HCC)    right breast cancer   Diabetes mellitus without complication (HCC)    GERD (gastroesophageal reflux disease)    H. pylori infection 05/2020   S/p treatment with Pylera .  H. pylori stool antigen negative on 12/21/2020.   HLD (hyperlipidemia)    Hypertension    IDA (iron deficiency anemia)    Hg in 9-10 range since September 2019.   Personal history of radiation therapy    Pessary maintenance 03/10/2015    Past Surgical History:  Procedure Laterality Date   BIOPSY  05/26/2020   Procedure: BIOPSY;  Surgeon: Suzette Espy, MD;  Location: [facility];  Service: Endoscopy;;   BREAST BIOPSY Left 2013   fribrocystic change with calcs   BREAST LUMPECTOMY Right    CHOLECYSTECTOMY     COLONOSCOPY N/A 05/26/2020   Procedure: COLONOSCOPY;  Surgeon: Suzette Espy, MD; Diverticulosis in sigmoid colon, friable colonic mucosa likely secondary to ASA/NSAID insult, exam was otherwise normal.     ESOPHAGOGASTRODUODENOSCOPY N/A 05/26/2020   Procedure: ESOPHAGOGASTRODUODENOSCOPY (EGD);  Surgeon: Suzette Espy, MD;  Normal esophagus s/p dilation, mucosal changes in the stomach biopsied, normal examined duodenum.   Pathology revealed H. pylori.   EYE SURGERY     GIVENS CAPSULE STUDY N/A 02/16/2021   Procedure: GIVENS CAPSULE STUDY;  Surgeon: Suzette Espy, MD;  gastric and duodenal bulbar erosions, 2 nonbleeding small bowel ulcers with benign appearance suspected to be secondary to chronic aspirin use.    Family History  Problem Relation Age of Onset   Cancer Father    Stroke Mother    Hypertension Mother    Diabetes Sister    Hypertension Sister    Cancer Brother    Hypertension Brother    Bipolar disorder Daughter    Hypertension Maternal Grandmother    Tuberculosis Paternal Grandmother    Hypertension Paternal Grandmother    Alcohol abuse Paternal Grandfather    Hypertension Paternal Grandfather    Colon cancer Neg Hx     Social History   Socioeconomic History   Marital status: Widowed    Spouse name: Not on file   Number of children: Not on file   Years of education: Not on file   Highest education level: Not on file  Occupational History   Not on file  Tobacco Use   Smoking status: Never   Smokeless tobacco: Never  Vaping Use   Vaping status: Never Used  Substance and Sexual Activity   Alcohol use: Not Currently    Comment: Couple ounces of champagne once  every couple of weeks or less.   Drug use: No   Sexual activity: Not Currently    Birth control/protection: Post-menopausal, Abstinence  Other Topics Concern   Not on file  Social History Narrative   Widow since 2015,married for 56 years.Lives alone.Retired.   Social Drivers of Health   Financial Resource Strain: Medium Risk (09/26/2023)   Overall Financial Resource Strain (CARDIA)    Difficulty of Paying Living Expenses: Somewhat hard  Food Insecurity: No Food Insecurity (09/26/2023)   Hunger Vital Sign    Worried About Running Out of Food in the Last Year: Never true    Ran Out of Food in the Last Year: Never true  Transportation Needs: No Transportation Needs (09/26/2023)   PRAPARE - Doctor, general practice (Medical): No    Lack of Transportation (Non-Medical): No  Physical Activity: Insufficiently Active (09/26/2023)   Exercise Vital Sign    Days of Exercise per Week: 3 days    Minutes of Exercise per Session: 30 min  Stress: No Stress Concern Present (09/26/2023)   [employer] of Occupational Health - Occupational Stress Questionnaire    Feeling of Stress : Only a little  Social Connections: Unknown (09/26/2023)   Social Connection and Isolation Panel [NHANES]    Frequency of Communication with Friends and Family: More than three times a week    Frequency of Social Gatherings with Friends and Family: More than three times a week    Attends Religious Services: More than 4 times per year    Active Member of [employer] or Organizations: No    Attends Banker Meetings: Never    Marital Status: Patient declined  Catering manager Violence: Not At Risk (09/26/2023)   Humiliation, Afraid, Rape, and Kick questionnaire    Fear of Current or Ex-Partner: No    Emotionally Abused: No    Physically Abused: No    Sexually Abused: No    Outpatient Medications Prior to Visit  Medication Sig Dispense Refill   ACCU-CHEK AVIVA PLUS test strip USE AS DIRECTED TO TEST TWICE DAILY. 100 strip 0   Accu-Chek Softclix Lancets lancets USE AS DIRECTED TO TEST TWICE DAILY. 100 each 0   acetaminophen  (TYLENOL ) 650 MG CR tablet Take 650 mg by mouth 2 (two) times daily as needed for pain.     amLODipine  (NORVASC ) 5 MG tablet TAKE ONE TABLET (5MG  TOTAL) BY MOUTH DAILY 30 tablet 1   cholecalciferol (VITAMIN D ) 1000 UNITS tablet Take 1,000 Units by mouth daily.     clotrimazole -betamethasone  (LOTRISONE ) cream For up to 14 days and then stop the medication. 45 g 0   empagliflozin  (JARDIANCE ) 25 MG TABS tablet Take 1 tablet (25 mg total) by mouth daily before breakfast. 30 tablet 3   glimepiride  (AMARYL ) 4 MG tablet Take 1 tablet (4 mg total) by mouth daily with breakfast. 60 tablet 3    glucose blood (ONETOUCH ULTRA) test strip USE DAILY AS DIRECTED 100 each 10   meclizine  (ANTIVERT ) 25 MG tablet TAKE ONE TABLET (25MG  TOTAL) BY MOUTH THREE TIMES DAILY 30 tablet 2   montelukast  (SINGULAIR ) 10 MG tablet TAKE ONE TABLET (10MG  TOTAL) BY MOUTH ATBEDTIME 30 tablet 3   Multiple Vitamins-Minerals (IMMUNE SUPPORT PO) Take 1 tablet by mouth daily.     pantoprazole  (PROTONIX ) 40 MG tablet TAKE ONE TABLET (40MG ) BY MOUTH DAILY 90 tablet 0   rosuvastatin  (CRESTOR ) 5 MG tablet TAKE ONE (1) TABLET BY MOUTH EVERY DAY 90 tablet 1  SLOW FE 142 (45 Fe) MG TBCR Take 2 tablets by mouth every morning. (Patient taking differently: Take 1 tablet by mouth every morning.) 180 tablet 1   triamcinolone  (NASACORT ) 55 MCG/ACT AERO nasal inhaler Place 2 sprays into the nose daily. 1 each 12   No facility-administered medications prior to visit.    Allergies  Allergen Reactions   Lisinopril  Other (See Comments)    Tongue swelling   Penicillins Swelling    Has patient had a PCN reaction causing immediate rash, facial/tongue/throat swelling, SOB or lightheadedness with hypotension: Yes Has patient had a PCN reaction causing severe rash involving mucus membranes or skin necrosis: No Has patient had a PCN reaction that required hospitalization: No Has patient had a PCN reaction occurring within the last 10 years: No If all of the above answers are "NO", then may proceed with Cephalosporin use.    ROS Review of Systems  Constitutional:  Negative for chills and fever.  Eyes:  Negative for visual disturbance.  Respiratory:  Negative for chest tightness and shortness of breath.   Musculoskeletal:        Ankles swelling  Neurological:  Negative for dizziness and headaches.      Objective:     Physical Exam HENT:     Head: Normocephalic.     Mouth/Throat:     Mouth: Mucous membranes are moist.  Cardiovascular:     Rate and Rhythm: Normal rate.     Heart sounds: Normal heart sounds.  Pulmonary:      Effort: Pulmonary effort is normal.     Breath sounds: Normal breath sounds.  Musculoskeletal:     Right ankle: Swelling present.     Left ankle: Swelling present.  Neurological:     Mental Status: She is alert.     BP 136/82   Pulse 87   Ht 4\' 8"  (1.422 m)   Wt 173 lb 1.9 oz (78.5 kg)   SpO2 95%   BMI 38.81 kg/m  Wt Readings from Last 3 Encounters:  05/12/24 173 lb 1.9 oz (78.5 kg)  02/22/24 175 lb 1.9 oz (79.4 kg)  01/07/24 177 lb (80.3 kg)    Lab Results  Component Value Date   TSH 1.230 02/22/2024   Lab Results  Component Value Date   WBC 3.9 02/22/2024   HGB 11.5 02/22/2024   HCT 35.2 02/22/2024   MCV 93 02/22/2024   PLT 268 02/22/2024   Lab Results  Component Value Date   NA 144 02/22/2024   K 4.3 02/22/2024   CO2 24 02/22/2024   GLUCOSE 100 (H) 02/22/2024   BUN 15 02/22/2024   CREATININE 0.76 02/22/2024   BILITOT 0.4 02/22/2024   ALKPHOS 72 02/22/2024   AST 20 02/22/2024   ALT 10 02/22/2024   PROT 7.5 02/22/2024   ALBUMIN 4.5 02/22/2024   CALCIUM  9.3 02/22/2024   ANIONGAP 9 07/18/2021   EGFR 75 02/22/2024   Lab Results  Component Value Date   CHOL 150 02/22/2024   Lab Results  Component Value Date   HDL 49 02/22/2024   Lab Results  Component Value Date   LDLCALC 83 02/22/2024   Lab Results  Component Value Date   TRIG 94 02/22/2024   Lab Results  Component Value Date   CHOLHDL 3.1 02/22/2024   Lab Results  Component Value Date   HGBA1C 6.4 (H) 02/22/2024      Assessment & Plan:  Primary osteoarthritis of left hip Assessment & Plan: The patient has a  chronic condition and would benefit from an adjustable bed, as adjustable positions can help relieve pressure and pain associated with arthritis, and allow the patient to get in and out of bed more easily and change positions independently.    Gastroesophageal reflux disease without esophagitis Assessment & Plan: The patient, with a longstanding history of GERD, would  benefit from an adjustable bed. Elevating the head can help prevent acid reflux and nighttime heartburn, thereby improving sleep quality.     Swelling of both ankles Assessment & Plan: Start Lasix (furosemide) 20 mg daily for 2 weeks to help relieve lower extremity swelling. -Take potassium supplementation concurrently to help prevent low potassium levels, a common side effect of Lasix. -Elevate legs daily for 20-30 minutes above heart level to reduce swelling. -Recommend daily use of compression stockings, regular physical activity, and weight reduction as appropriate. -Advise avoiding prolonged periods of sitting or standing to help alleviate symptoms.  Orders: -     Furosemide; Take 1 tablet (20 mg total) by mouth daily for 14 days.  Dispense: 14 tablet; Refill: 0 -     Potassium Chloride Crys ER; Take 1 tablet (10 mEq total) by mouth 2 (two) times daily for 14 days.  Dispense: 28 tablet; Refill: 0  Seasonal allergies Assessment & Plan: Start taking Singulair  (montelukast ) 10 mg at bedtime to help relieve allergy symptoms  Orders: -     Montelukast  Sodium; Take 1 tablet (10 mg total) by mouth at bedtime.  Dispense: 30 tablet; Refill: 2  IFG (impaired fasting glucose) -     Hemoglobin A1c -     Hemoglobin A1c  Vitamin D  deficiency -     VITAMIN D  25 Hydroxy (Vit-D Deficiency, Fractures) -     VITAMIN D  25 Hydroxy (Vit-D Deficiency, Fractures)  TSH (thyroid -stimulating hormone deficiency) -     TSH + free T4 -     TSH + free T4  Other hyperlipidemia -     Lipid panel -     CMP14+EGFR -     CBC with Differential/Platelet -     Lipid panel -     CMP14+EGFR -     CBC with Differential/Platelet  Note: This chart has been completed using Engineer, civil (consulting) software, and while attempts have been made to ensure accuracy, certain words and phrases may not be transcribed as intended.    Follow-up: Return in about 5 months (around 10/12/2024).   Menna Abeln, FNP

## 2024-05-12 NOTE — Assessment & Plan Note (Signed)
 Start Lasix (furosemide) 20 mg daily for 2 weeks to help relieve lower extremity swelling. -Take potassium supplementation concurrently to help prevent low potassium levels, a common side effect of Lasix. -Elevate legs daily for 20-30 minutes above heart level to reduce swelling. -Recommend daily use of compression stockings, regular physical activity, and weight reduction as appropriate. -Advise avoiding prolonged periods of sitting or standing to help alleviate symptoms.

## 2024-05-12 NOTE — Patient Instructions (Addendum)
 I appreciate the opportunity to provide care to you today!    Follow up:  5 months  Labs: please stop by the lab next month to get your blood drawn (CBC, CMP, TSH, Lipid profile, HgA1c, Vit D)  Swelling -Start Lasix (furosemide) 20 mg daily for 2 weeks to help relieve lower extremity swelling. -Take potassium supplementation concurrently to help prevent low potassium levels, a common side effect of Lasix. -Elevate legs daily for 20-30 minutes above heart level to reduce swelling. -Recommend daily use of compression stockings, regular physical activity, and weight reduction as appropriate. -Advise avoiding prolonged periods of sitting or standing to help alleviate symptoms.  Allergies: -Start taking Singulair  (montelukast ) 10 mg at bedtime to help relieve allergy symptoms.    Please follow up if your symptoms worsen or fail to improve.    It was a pleasure to see you and I look forward to continuing to work together on your health and well-being. Please do not hesitate to call the office if you need care or have questions about your care.  In case of emergency, please visit the Emergency Department for urgent care, or contact our clinic at [phone] to schedule an appointment. We're here to help you!   Have a wonderful day and week. With Gratitude, Ignacia Gentzler MSN, FNP-BC

## 2024-05-12 NOTE — Assessment & Plan Note (Signed)
 Start taking Singulair  (montelukast ) 10 mg at bedtime to help relieve allergy symptoms

## 2024-05-13 LAB — CBC WITH DIFFERENTIAL/PLATELET
Basophils Absolute: 0 10*3/uL (ref 0.0–0.2)
Basos: 0 %
EOS (ABSOLUTE): 0 10*3/uL (ref 0.0–0.4)
Eos: 1 %
Hematocrit: 34.2 % (ref 34.0–46.6)
Hemoglobin: 11.2 g/dL (ref 11.1–15.9)
Immature Grans (Abs): 0 10*3/uL (ref 0.0–0.1)
Immature Granulocytes: 0 %
Lymphocytes Absolute: 1.1 10*3/uL (ref 0.7–3.1)
Lymphs: 30 %
MCH: 29.6 pg (ref 26.6–33.0)
MCHC: 32.7 g/dL (ref 31.5–35.7)
MCV: 90 fL (ref 79–97)
Monocytes Absolute: 0.7 10*3/uL (ref 0.1–0.9)
Monocytes: 19 %
Neutrophils Absolute: 1.7 10*3/uL (ref 1.4–7.0)
Neutrophils: 50 %
Platelets: 241 10*3/uL (ref 150–450)
RBC: 3.79 x10E6/uL (ref 3.77–5.28)
RDW: 14.3 % (ref 11.7–15.4)
WBC: 3.5 10*3/uL (ref 3.4–10.8)

## 2024-05-13 LAB — CMP14+EGFR
ALT: 11 IU/L (ref 0–32)
AST: 23 IU/L (ref 0–40)
Albumin: 4.5 g/dL (ref 3.7–4.7)
Alkaline Phosphatase: 64 IU/L (ref 44–121)
BUN/Creatinine Ratio: 19 (ref 12–28)
BUN: 14 mg/dL (ref 8–27)
Bilirubin Total: 0.4 mg/dL (ref 0.0–1.2)
CO2: 23 mmol/L (ref 20–29)
Calcium: 9.2 mg/dL (ref 8.7–10.3)
Chloride: 103 mmol/L (ref 96–106)
Creatinine, Ser: 0.75 mg/dL (ref 0.57–1.00)
Globulin, Total: 2.9 g/dL (ref 1.5–4.5)
Glucose: 100 mg/dL — ABNORMAL HIGH (ref 70–99)
Potassium: 3.4 mmol/L — ABNORMAL LOW (ref 3.5–5.2)
Sodium: 143 mmol/L (ref 134–144)
Total Protein: 7.4 g/dL (ref 6.0–8.5)
eGFR: 76 mL/min/{1.73_m2} (ref >59)

## 2024-05-13 LAB — LIPID PANEL
Chol/HDL Ratio: 2.6 ratio (ref 0.0–4.4)
Cholesterol, Total: 130 mg/dL (ref 100–199)
HDL: 50 mg/dL (ref >39)
LDL Chol Calc (NIH): 62 mg/dL (ref 0–99)
Triglycerides: 98 mg/dL (ref 0–149)
VLDL Cholesterol Cal: 18 mg/dL (ref 5–40)

## 2024-05-13 LAB — TSH+FREE T4
Free T4: 1.03 ng/dL (ref 0.82–1.77)
TSH: 0.785 u[IU]/mL (ref 0.450–4.500)

## 2024-05-13 LAB — VITAMIN D 25 HYDROXY (VIT D DEFICIENCY, FRACTURES): Vit D, 25-Hydroxy: 42.3 ng/mL (ref 30.0–100.0)

## 2024-05-13 LAB — HEMOGLOBIN A1C
Est. average glucose Bld gHb Est-mCnc: 134 mg/dL
Hgb A1c MFr Bld: 6.3 % — ABNORMAL HIGH (ref 4.8–5.6)

## 2024-05-22 ENCOUNTER — Other Ambulatory Visit: Payer: Self-pay | Admitting: Family Medicine

## 2024-05-22 DIAGNOSIS — M25472 Effusion, left ankle: Secondary | ICD-10-CM

## 2024-05-26 ENCOUNTER — Other Ambulatory Visit: Payer: Self-pay | Admitting: Family Medicine

## 2024-05-26 DIAGNOSIS — R42 Dizziness and giddiness: Secondary | ICD-10-CM

## 2024-05-26 DIAGNOSIS — M25471 Effusion, right ankle: Secondary | ICD-10-CM

## 2024-05-26 MED ORDER — MECLIZINE HCL 25 MG PO TABS: 25.0000 mg | ORAL_TABLET | Freq: Three times a day (TID) | ORAL | 2 refills | Status: DC | PRN

## 2024-05-26 MED ORDER — ROSUVASTATIN CALCIUM 5 MG PO TABS: ORAL_TABLET | ORAL | 1 refills | Status: DC

## 2024-05-26 NOTE — Telephone Encounter (Signed)
 Copied from CRM [id number]. Topic: Clinical - Medication Refill >> May 26, 2024  9:40 AM Crispin Dolphin wrote: Medication:  rosuvastatin  (CRESTOR ) 5 MG tablet meclizine  (ANTIVERT ) 25 MG tablet   Has the patient contacted their pharmacy? Yes (Agent: If no, request that the patient contact the pharmacy for the refill. If patient does not wish to contact the pharmacy document the reason why and proceed with request.) (Agent: If yes, when and what did the pharmacy advise?)  This is the patient's preferred pharmacy:  [facility] - [place], [place] - [address] [place] [address] Phone: [phone] Fax: [phone]   Is this the correct pharmacy for this prescription? Yes If no, delete pharmacy and type the correct one.   Has the prescription been filled recently? No  Is the patient out of the medication? Yes  Has the patient been seen for an appointment in the last year OR does the patient have an upcoming appointment? Yes  Can we respond through MyChart? No  Agent: Please be advised that Rx refills may take up to 3 business days. We ask that you follow-up with your pharmacy.

## 2024-05-29 ENCOUNTER — Other Ambulatory Visit: Payer: Self-pay | Admitting: Family Medicine

## 2024-05-29 DIAGNOSIS — E118 Type 2 diabetes mellitus with unspecified complications: Secondary | ICD-10-CM

## 2024-06-02 ENCOUNTER — Ambulatory Visit: Payer: Self-pay

## 2024-06-02 NOTE — Telephone Encounter (Signed)
 FYI Only or Action Required?: Action required by provider: update on patient condition.  Patient was last seen in primary care on 05/12/2024 by Kimberly Best, [name], FNP. Called Nurse Triage reporting Sinusitis. Symptoms began a week ago. Interventions attempted: Nothing. Symptoms are: unchanged. Headache, pain across cheeks.  Triage Disposition: See PCP When Office is Open (Within 3 Days)  Patient/caregiver understands and will follow disposition?: Yes      Copied from CRM [id number]. Topic: Clinical - Red Word Triage >> Jun 02, 2024 10:46 AM Avram MATSU wrote: Red Word that prompted transfer to Nurse Triage: small blood clots from right nose Answer Assessment - Initial Assessment Questions 1. LOCATION: Where does it hurt?      Headache, cheeks 2. ONSET: When did the sinus pain start?  (e.g., hours, days)      Last week 3. SEVERITY: How bad is the pain?   (Scale 1-10; mild, moderate or severe)   - MILD (1-3): doesn't interfere with normal activities    - MODERATE (4-7): interferes with normal activities (e.g., work or school) or awakens from sleep   - SEVERE (8-10): excruciating pain and patient unable to do any normal activities        Moderate 4. RECURRENT SYMPTOM: Have you ever had sinus problems before? If Yes, ask: When was the last time? and What happened that time?      yes 5. NASAL CONGESTION: Is the nose blocked? If Yes, ask: Can you open it or must you breathe through your mouth?     Blood  6. NASAL DISCHARGE: Do you have discharge from your nose? If so ask, What color?     clear 7. FEVER: Do you have a fever? If Yes, ask: What is it, how was it measured, and when did it start?      no 8. OTHER SYMPTOMS: Do you have any other symptoms? (e.g., sore throat, cough, earache, difficulty breathing)     no 9. PREGNANCY: Is there any chance you are pregnant? When was your last menstrual period?     no  Protocols used: Sinus Pain or Congestion-A-AH  Reason  for Disposition  [1] Sinus congestion (pressure, fullness) AND [2] present > 10 days  Answer Assessment - Initial Assessment Questions 1. LOCATION: Where does it hurt?      Headache, cheeks 2. ONSET: When did the sinus pain start?  (e.g., hours, days)      Last week 3. SEVERITY: How bad is the pain?   (Scale 1-10; mild, moderate or severe)   - MILD (1-3): doesn't interfere with normal activities    - MODERATE (4-7): interferes with normal activities (e.g., work or school) or awakens from sleep   - SEVERE (8-10): excruciating pain and patient unable to do any normal activities        Moderate 4. RECURRENT SYMPTOM: Have you ever had sinus problems before? If Yes, ask: When was the last time? and What happened that time?      yes 5. NASAL CONGESTION: Is the nose blocked? If Yes, ask: Can you open it or must you breathe through your mouth?     Blood  6. NASAL DISCHARGE: Do you have discharge from your nose? If so ask, What color?     clear 7. FEVER: Do you have a fever? If Yes, ask: What is it, how was it measured, and when did it start?      no 8. OTHER SYMPTOMS: Do you have any other symptoms? (e.g., sore throat, cough,  earache, difficulty breathing)     no 9. PREGNANCY: Is there any chance you are pregnant? When was your last menstrual period?     no  Protocols used: Sinus Pain or Congestion-A-AH

## 2024-06-04 ENCOUNTER — Ambulatory Visit (INDEPENDENT_AMBULATORY_CARE_PROVIDER_SITE_OTHER): Payer: Self-pay | Admitting: Family Medicine

## 2024-06-04 ENCOUNTER — Encounter: Payer: Self-pay | Admitting: Family Medicine

## 2024-06-04 ENCOUNTER — Ambulatory Visit: Payer: Self-pay | Admitting: Family Medicine

## 2024-06-04 VITALS — BP 132/72 | HR 88 | Resp 16 | Ht <= 58 in | Wt 173.0 lb

## 2024-06-04 DIAGNOSIS — E118 Type 2 diabetes mellitus with unspecified complications: Secondary | ICD-10-CM

## 2024-06-04 DIAGNOSIS — J0100 Acute maxillary sinusitis, unspecified: Secondary | ICD-10-CM | POA: Diagnosis not present

## 2024-06-04 DIAGNOSIS — E876 Hypokalemia: Secondary | ICD-10-CM

## 2024-06-04 DIAGNOSIS — J320 Chronic maxillary sinusitis: Secondary | ICD-10-CM | POA: Insufficient documentation

## 2024-06-04 DIAGNOSIS — M25472 Effusion, left ankle: Secondary | ICD-10-CM

## 2024-06-04 MED ORDER — AZITHROMYCIN 250 MG PO TABS: ORAL_TABLET | ORAL | 0 refills | Status: AC

## 2024-06-04 MED ORDER — POTASSIUM CHLORIDE CRYS ER 10 MEQ PO TBCR: 10.0000 meq | EXTENDED_RELEASE_TABLET | Freq: Two times a day (BID) | ORAL | 1 refills | Status: DC

## 2024-06-04 NOTE — Patient Instructions (Signed)
 F/U with PCP as before  Call if you need to be seen sooner  5 day antibiotic course , Azithromycin  , is prescribed for sinus infection  Thanks for choosing [facility], we consider it a privelige to serve you.

## 2024-06-09 NOTE — Assessment & Plan Note (Signed)
 Controlled.

## 2024-06-09 NOTE — Assessment & Plan Note (Addendum)
 Z pack prescribed  and saline nasal flush as needed

## 2024-06-09 NOTE — Progress Notes (Signed)
   Kimberly Best     MRN: [id number]      DOB: 15-Feb-1933  Chief Complaint  Patient presents with   Epistaxis    States sometimes when she wipes her nose there will be some blood and has been having some vertigo. Is taking the meclizine  and it helps a lot     HPI Kimberly Best is here with above symptoms which have ben going on for 1 week, some improvement , but  persisting to the extent that she presents for acute visit C/o intemrittent chills , no documented  fever ROS Denies ear pain or sore throat. Denies chest congestion, productive cough or wheezing. Denies chest pains, palpitations and leg swelling Denies abdominal pain, nausea, vomiting,diarrhea or constipation.   Denies dysuria, frequency, hesitancy or incontinence. Denies  uncontrolled joint pain, swelling and limitation in mobility. Denies headaches, seizures, numbness, or tingling. Denies depression, anxiety or insomnia. Denies skin break down or rash.   PE  BP 132/72   Pulse 88   Resp 16   Ht 4' 8 (1.422 m)   Wt 173 lb (78.5 kg)   SpO2 94%   BMI 38.79 kg/m   Patient alert and oriented and in no cardiopulmonary distress.  HEENT: No facial asymmetry, EOMI,     Neck supple .Maxillarey sinus tenderness , right greater than left  Chest: Clear to auscultation bilaterally.  CVS: S1, S2 no murmurs, no S3.Regular rate.  ABD: Soft non tender.   Ext: No edema  MS: Adequate though reduced ROM spine, shoulders, hips and knees.  Skin: Intact, no ulcerations or rash noted.  Psych: Good eye contact, normal affect. Memory intact not anxious or depressed appearing.  CNS: CN 2-12 intact, power,  normal throughout.no focal deficits noted.   Assessment & Plan  Acute maxillary sinusitis Z pack prescribed  and saline nasal flush as needed  Controlled diabetes mellitus type 2 with complications (HCC) Controlled

## 2024-06-24 ENCOUNTER — Other Ambulatory Visit: Payer: Self-pay | Admitting: Family Medicine

## 2024-06-24 DIAGNOSIS — I10 Essential (primary) hypertension: Secondary | ICD-10-CM

## 2024-06-24 DIAGNOSIS — M25472 Effusion, left ankle: Secondary | ICD-10-CM

## 2024-07-04 ENCOUNTER — Other Ambulatory Visit: Payer: Self-pay | Admitting: Family Medicine

## 2024-07-04 DIAGNOSIS — M25471 Effusion, right ankle: Secondary | ICD-10-CM

## 2024-07-08 ENCOUNTER — Other Ambulatory Visit: Payer: Self-pay | Admitting: Family Medicine

## 2024-07-08 DIAGNOSIS — M25472 Effusion, left ankle: Secondary | ICD-10-CM

## 2024-07-09 ENCOUNTER — Telehealth: Payer: Self-pay | Admitting: Family Medicine

## 2024-07-09 NOTE — Telephone Encounter (Signed)
 Back Door Disability - [employer] [place] form  Noted Copied Sleeved Original placed in provider box Copy placed in front folder desk Call patient when ready

## 2024-07-10 ENCOUNTER — Other Ambulatory Visit: Payer: Self-pay | Admitting: Family Medicine

## 2024-07-10 DIAGNOSIS — M25471 Effusion, right ankle: Secondary | ICD-10-CM

## 2024-07-14 ENCOUNTER — Other Ambulatory Visit: Payer: Self-pay | Admitting: Family Medicine

## 2024-07-14 DIAGNOSIS — M25472 Effusion, left ankle: Secondary | ICD-10-CM

## 2024-07-14 NOTE — Telephone Encounter (Unsigned)
 Copied from CRM [id number]. Topic: Clinical - Medication Refill >> Jul 14, 2024 11:17 AM Ivette P wrote: Medication: furosemide  (LASIX ) 20 MG tablet  Has the patient contacted their pharmacy? Yes (Agent: If no, request that the patient contact the pharmacy for the refill. If patient does not wish to contact the pharmacy document the reason why and proceed with request.) (Agent: If yes, when and what did the pharmacy advise?)  This is the patient's preferred pharmacy:  [facility] - [place], [place] - [address] [place] [address] Phone: [phone] Fax: [phone]   Is this the correct pharmacy for this prescription? Yes If no, delete pharmacy and type the correct one.   Has the prescription been filled recently? Yes, 06/24/2024  Is the patient out of the medication? Yes  Has the patient been seen for an appointment in the last year OR does the patient have an upcoming appointment? Yes  Can we respond through MyChart? No  Agent: Please be advised that Rx refills may take up to 3 business days. We ask that you follow-up with your pharmacy.

## 2024-07-15 NOTE — Telephone Encounter (Signed)
In providers folder for completion.

## 2024-07-18 MED ORDER — FUROSEMIDE 20 MG PO TABS: 20.0000 mg | ORAL_TABLET | Freq: Every day | ORAL | 0 refills | Status: DC

## 2024-07-25 ENCOUNTER — Ambulatory Visit: Admitting: Obstetrics & Gynecology

## 2024-07-25 VITALS — BP 123/77 | HR 74

## 2024-07-25 DIAGNOSIS — N813 Complete uterovaginal prolapse: Secondary | ICD-10-CM

## 2024-07-25 DIAGNOSIS — Z4689 Encounter for fitting and adjustment of other specified devices: Secondary | ICD-10-CM | POA: Diagnosis not present

## 2024-07-31 ENCOUNTER — Ambulatory Visit: Admitting: Obstetrics & Gynecology

## 2024-08-01 ENCOUNTER — Telehealth: Payer: Self-pay

## 2024-08-01 NOTE — Telephone Encounter (Signed)
 Patient requested parking placard form to be completed  Form in provider box for completion - Patient demographics and provider information filled out  Please contact patient when ready for pick up.  FYI: Patient picking up another form on 8/25 at FO -may request parking placard form at that time.

## 2024-08-01 NOTE — Telephone Encounter (Signed)
 Form completed and e-mailed to [email or web address]  Patient aware form is at front desk for pick up Form missing applicant signature Copy sent to scan

## 2024-08-04 ENCOUNTER — Other Ambulatory Visit: Payer: Self-pay | Admitting: Family Medicine

## 2024-08-04 DIAGNOSIS — M25472 Effusion, left ankle: Secondary | ICD-10-CM

## 2024-08-04 NOTE — Telephone Encounter (Signed)
 Put in providers box for signature

## 2024-08-06 NOTE — Telephone Encounter (Signed)
 Placed in mail/fax box. Pt can be notified to pickup

## 2024-08-06 NOTE — Telephone Encounter (Signed)
 Attempted to reach pt- phone just kept ringing

## 2024-08-14 ENCOUNTER — Ambulatory Visit (INDEPENDENT_AMBULATORY_CARE_PROVIDER_SITE_OTHER): Admitting: Nurse Practitioner

## 2024-08-14 ENCOUNTER — Encounter: Payer: Self-pay | Admitting: Nurse Practitioner

## 2024-08-14 VITALS — BP 136/78 | HR 85 | Ht 59.0 in | Wt 172.0 lb

## 2024-08-14 DIAGNOSIS — D509 Iron deficiency anemia, unspecified: Secondary | ICD-10-CM | POA: Diagnosis not present

## 2024-08-14 DIAGNOSIS — J309 Allergic rhinitis, unspecified: Secondary | ICD-10-CM | POA: Diagnosis not present

## 2024-08-14 DIAGNOSIS — R42 Dizziness and giddiness: Secondary | ICD-10-CM | POA: Diagnosis not present

## 2024-08-14 NOTE — Progress Notes (Signed)
 Established Patient Office Visit  Subjective:  Patient ID: Kimberly Best, female    DOB: 04-07-1933  Age: 89 y.o. MRN: [id number]  Chief Complaint  Patient presents with   Dizziness    Dizziness, not feeling the greatest, staggering around    Patient here today for an acute visit today, feels dizzy for 2-3 weeks.  She has hx of DMII, reports her sugars do not go above 200 mg/dl but only dropped one day to 120 mg/dl.  She denies chest pain, chest pressure, and no fast HR.  Has hx of BPPV.  Wears hearing aids, no ear cerumen built up.  She consumes 3 sixteen ounce bottles of water per day.  Patient has been without ferrous sulfate  for about a week.    Dizziness    No other concerns at this time.   Past Medical History:  Diagnosis Date   Allergy    Anxiety    Arthritis    Cancer (HCC)    right breast cancer   Diabetes mellitus without complication (HCC)    GERD (gastroesophageal reflux disease)    H. pylori infection 05/2020   S/p treatment with Pylera .  H. pylori stool antigen negative on 12/21/2020.   HLD (hyperlipidemia)    Hypertension    IDA (iron deficiency anemia)    Hg in 9-10 range since September 2019.   Personal history of radiation therapy    Pessary maintenance 03/10/2015    Past Surgical History:  Procedure Laterality Date   BIOPSY  05/26/2020   Procedure: BIOPSY;  Surgeon: Shaaron Lamar HERO, MD;  Location: [facility];  Service: Endoscopy;;   BREAST BIOPSY Left 2013   fribrocystic change with calcs   BREAST LUMPECTOMY Right    CHOLECYSTECTOMY     COLONOSCOPY N/A 05/26/2020   Procedure: COLONOSCOPY;  Surgeon: Shaaron Lamar HERO, MD; Diverticulosis in sigmoid colon, friable colonic mucosa likely secondary to ASA/NSAID insult, exam was otherwise normal.     ESOPHAGOGASTRODUODENOSCOPY N/A 05/26/2020   Procedure: ESOPHAGOGASTRODUODENOSCOPY (EGD);  Surgeon: Shaaron Lamar HERO, MD;  Normal esophagus s/p dilation, mucosal changes in the stomach biopsied, normal  examined duodenum.  Pathology revealed H. pylori.   EYE SURGERY     GIVENS CAPSULE STUDY N/A 02/16/2021   Procedure: GIVENS CAPSULE STUDY;  Surgeon: Shaaron Lamar HERO, MD;  gastric and duodenal bulbar erosions, 2 nonbleeding small bowel ulcers with benign appearance suspected to be secondary to chronic aspirin use.    Social History   Socioeconomic History   Marital status: Widowed    Spouse name: Not on file   Number of children: Not on file   Years of education: Not on file   Highest education level: Not on file  Occupational History   Not on file  Tobacco Use   Smoking status: Never   Smokeless tobacco: Never  Vaping Use   Vaping status: Never Used  Substance and Sexual Activity   Alcohol use: Not Currently    Comment: Couple ounces of champagne once every couple of weeks or less.   Drug use: No   Sexual activity: Not Currently    Birth control/protection: Post-menopausal, Abstinence  Other Topics Concern   Not on file  Social History Narrative   Widow since 2015,married for 56 years.Lives alone.Retired.   Social Drivers of Health   Financial Resource Strain: Medium Risk (09/26/2023)   Overall Financial Resource Strain (CARDIA)    Difficulty of Paying Living Expenses: Somewhat hard  Food Insecurity: No Food Insecurity (09/26/2023)  Hunger Vital Sign    Worried About Running Out of Food in the Last Year: Never true    Ran Out of Food in the Last Year: Never true  Transportation Needs: No Transportation Needs (09/26/2023)   PRAPARE - Administrator, Civil Service (Medical): No    Lack of Transportation (Non-Medical): No  Physical Activity: Insufficiently Active (09/26/2023)   Exercise Vital Sign    Days of Exercise per Week: 3 days    Minutes of Exercise per Session: 30 min  Stress: No Stress Concern Present (09/26/2023)   [employer] of Occupational Health - Occupational Stress Questionnaire    Feeling of Stress : Only a little  Social  Connections: Unknown (09/26/2023)   Social Connection and Isolation Panel    Frequency of Communication with Friends and Family: More than three times a week    Frequency of Social Gatherings with Friends and Family: More than three times a week    Attends Religious Services: More than 4 times per year    Active Member of [employer] or Organizations: No    Attends Banker Meetings: Never    Marital Status: Patient declined  Catering manager Violence: Not At Risk (09/26/2023)   Humiliation, Afraid, Rape, and Kick questionnaire    Fear of Current or Ex-Partner: No    Emotionally Abused: No    Physically Abused: No    Sexually Abused: No    Family History  Problem Relation Age of Onset   Cancer Father    Stroke Mother    Hypertension Mother    Diabetes Sister    Hypertension Sister    Cancer Brother    Hypertension Brother    Bipolar disorder Daughter    Hypertension Maternal Grandmother    Tuberculosis Paternal Grandmother    Hypertension Paternal Grandmother    Alcohol abuse Paternal Grandfather    Hypertension Paternal Grandfather    Colon cancer Neg Hx     Allergies  Allergen Reactions   Lisinopril  Other (See Comments)    Tongue swelling   Penicillins Swelling    Has patient had a PCN reaction causing immediate rash, facial/tongue/throat swelling, SOB or lightheadedness with hypotension: Yes Has patient had a PCN reaction causing severe rash involving mucus membranes or skin necrosis: No Has patient had a PCN reaction that required hospitalization: No Has patient had a PCN reaction occurring within the last 10 years: No If all of the above answers are NO, then may proceed with Cephalosporin use.    Outpatient Medications Prior to Visit  Medication Sig   ACCU-CHEK AVIVA PLUS test strip USE AS DIRECTED TO TEST TWICE DAILY.   Accu-Chek Softclix Lancets lancets USE AS DIRECTED TO TEST TWICE DAILY.   acetaminophen  (TYLENOL ) 650 MG CR tablet Take 650 mg by mouth  2 (two) times daily as needed for pain.   amLODipine  (NORVASC ) 5 MG tablet TAKE ONE TABLET (5MG  TOTAL) BY MOUTH DAILY   cholecalciferol (VITAMIN D ) 1000 UNITS tablet Take 1,000 Units by mouth daily.   clotrimazole -betamethasone  (LOTRISONE ) cream For up to 14 days and then stop the medication.   furosemide  (LASIX ) 20 MG tablet TAKE ONE (1) TABLET BY MOUTH EVERY DAY   glimepiride  (AMARYL ) 4 MG tablet TAKE ONE TABLET (4MG  TOTAL) BY MOUTH DAILY WITH BREAKFAST   glucose blood (ONETOUCH ULTRA) test strip USE DAILY AS DIRECTED   JARDIANCE  25 MG TABS tablet TAKE ONE TABLET (25MG  TOTAL) BY MOUTH DAILY BEFORE BREAKFAST   meclizine  (ANTIVERT ) 25  MG tablet Take 1 tablet (25 mg total) by mouth 3 (three) times daily as needed for dizziness.   montelukast  (SINGULAIR ) 10 MG tablet TAKE ONE TABLET (10MG  TOTAL) BY MOUTH ATBEDTIME   montelukast  (SINGULAIR ) 10 MG tablet Take 1 tablet (10 mg total) by mouth at bedtime.   Multiple Vitamins-Minerals (IMMUNE SUPPORT PO) Take 1 tablet by mouth daily.   pantoprazole  (PROTONIX ) 40 MG tablet TAKE ONE TABLET (40MG ) BY MOUTH DAILY   potassium chloride  (KLOR-CON  M) 10 MEQ tablet Take 1 tablet (10 mEq total) by mouth 2 (two) times daily.   rosuvastatin  (CRESTOR ) 5 MG tablet TAKE ONE (1) TABLET BY MOUTH EVERY DAY   SLOW FE 142 (45 Fe) MG TBCR Take 2 tablets by mouth every morning.   triamcinolone  (NASACORT ) 55 MCG/ACT AERO nasal inhaler Place 2 sprays into the nose daily.   No facility-administered medications prior to visit.    Review of Systems  Neurological:  Positive for dizziness.       Objective:   BP 136/78   Pulse 85   Ht 4' 11 (1.499 m)   Wt 172 lb (78 kg)   SpO2 94%   BMI 34.74 kg/m   Vitals:   08/14/24 1416  BP: 136/78  Pulse: 85  Height: 4' 11 (1.499 m)  Weight: 172 lb (78 kg)  SpO2: 94%  BMI (Calculated): 34.72    Physical Exam Vitals and nursing note reviewed.  Constitutional:      Appearance: Normal appearance.  HENT:     Head:  Normocephalic.     Right Ear: Tympanic membrane, ear canal and external ear normal.     Left Ear: Tympanic membrane, ear canal and external ear normal.     Nose: Nose normal.     Mouth/Throat:     Mouth: Mucous membranes are moist.  Cardiovascular:     Rate and Rhythm: Normal rate and regular rhythm.     Pulses: Normal pulses.     Heart sounds: Normal heart sounds.  Pulmonary:     Effort: Pulmonary effort is normal.     Breath sounds: Normal breath sounds.  Musculoskeletal:        General: Normal range of motion.     Cervical back: Normal range of motion and neck supple.  Skin:    General: Skin is warm and dry.  Neurological:     Mental Status: She is alert and oriented to person, place, and time.  Psychiatric:        Mood and Affect: Mood normal.        Behavior: Behavior normal.      No results found for any visits on 08/14/24.  No results found for this or any previous visit (from the past 2160 hours).    Assessment & Plan:  ) Dizziness/vertigo- CBC, CMP, keeps sugars controlled, more water daily, keep taking ferrous sulfate  daily 2) Follow up appt in 1 week to reassess vertigo   Problem List Items Addressed This Visit   None   No follow-ups on file.   Total time spent: 15 minutes  Neale Carpen, NP  08/14/2024   This document may have been prepared by [facility] Voice Recognition software and as such may include unintentional dictation errors.

## 2024-08-14 NOTE — Patient Instructions (Signed)
 1) Dizziness/vertigo- CBC, CMP, keeps sugars controlled, more water daily, keep taking ferrous sulfate  daily 2) Follow up appt in 1 week to reassess vertigo

## 2024-08-15 ENCOUNTER — Ambulatory Visit: Payer: Self-pay

## 2024-08-15 LAB — CBC WITH DIFFERENTIAL/PLATELET
Basophils Absolute: 0 x10E3/uL (ref 0.0–0.2)
Basos: 0 %
EOS (ABSOLUTE): 0.1 x10E3/uL (ref 0.0–0.4)
Eos: 1 %
Hematocrit: 32.8 % — ABNORMAL LOW (ref 34.0–46.6)
Hemoglobin: 10.7 g/dL — ABNORMAL LOW (ref 11.1–15.9)
Immature Grans (Abs): 0 x10E3/uL (ref 0.0–0.1)
Immature Granulocytes: 0 %
Lymphocytes Absolute: 2.7 x10E3/uL (ref 0.7–3.1)
Lymphs: 45 %
MCH: 29.9 pg (ref 26.6–33.0)
MCHC: 32.6 g/dL (ref 31.5–35.7)
MCV: 92 fL (ref 79–97)
Monocytes Absolute: 0.7 x10E3/uL (ref 0.1–0.9)
Monocytes: 12 %
Neutrophils Absolute: 2.6 x10E3/uL (ref 1.4–7.0)
Neutrophils: 42 %
Platelets: 256 x10E3/uL (ref 150–450)
RBC: 3.58 x10E6/uL — ABNORMAL LOW (ref 3.77–5.28)
RDW: 14.5 % (ref 11.7–15.4)
WBC: 6 x10E3/uL (ref 3.4–10.8)

## 2024-08-15 LAB — CMP14+EGFR
ALT: 13 IU/L (ref 0–32)
AST: 22 IU/L (ref 0–40)
Albumin: 4.4 g/dL (ref 3.7–4.7)
Alkaline Phosphatase: 66 IU/L (ref 44–121)
BUN/Creatinine Ratio: 16 (ref 12–28)
BUN: 15 mg/dL (ref 8–27)
Bilirubin Total: 0.4 mg/dL (ref 0.0–1.2)
CO2: 24 mmol/L (ref 20–29)
Calcium: 9.2 mg/dL (ref 8.7–10.3)
Chloride: 104 mmol/L (ref 96–106)
Creatinine, Ser: 0.92 mg/dL (ref 0.57–1.00)
Globulin, Total: 3.1 g/dL (ref 1.5–4.5)
Glucose: 92 mg/dL (ref 70–99)
Potassium: 4.1 mmol/L (ref 3.5–5.2)
Sodium: 141 mmol/L (ref 134–144)
Total Protein: 7.5 g/dL (ref 6.0–8.5)
eGFR: 60 mL/min/1.73 (ref >59)

## 2024-08-17 NOTE — Progress Notes (Signed)
 Chief Complaint  Patient presents with   Pessary Check    Blood pressure 123/77, pulse 74.  Kimberly Best presents today for routine follow up related to her pessary.   She uses a Milex ring with support #5 She reports no vaginal discharge and no vaginal bleeding   Likert scale(1 not bothersome -5 very bothersome)  :  1  Exam reveals no undue vaginal mucosal pressure of breakdown, no discharge and no vaginal bleeding.  Vaginal Epithelial Abnormality Classification System:   0 0    No abnormalities 1    Epithelial erythema 2    Granulation tissue 3    Epithelial break or erosion, 1 cm or less 4    Epithelial break or erosion, 1 cm or greater  The pessary is removed, cleaned and replaced without difficulty.      ICD-10-CM   1. Pessary maintenance, refit 10/18/20 with Milex ring with support #5  Z46.89     2. Uterovaginal prolapse, complete  N81.3        [name] will be sen back in 4 months for continued follow up.  [name] Inch, MD  08/17/2024 10:28 PM

## 2024-08-18 ENCOUNTER — Telehealth: Payer: Self-pay

## 2024-08-18 ENCOUNTER — Other Ambulatory Visit: Payer: Self-pay | Admitting: Nurse Practitioner

## 2024-08-18 DIAGNOSIS — J309 Allergic rhinitis, unspecified: Secondary | ICD-10-CM

## 2024-08-18 MED ORDER — CETIRIZINE HCL 10 MG PO TABS: 10.0000 mg | ORAL_TABLET | Freq: Every day | ORAL | 0 refills | Status: DC

## 2024-08-18 NOTE — Telephone Encounter (Signed)
 Copied from CRM [id number]. Topic: Clinical - Prescription Issue >> Aug 15, 2024  3:14 PM Charlet HERO wrote: Reason for CRM: Patient is calling about the med that was suppose to be called in on yesterday for sinus after her visit spoke to the pharmacy and they have not rcvd the script. Reached out to office for asst.Please call the patient back.

## 2024-08-18 NOTE — Telephone Encounter (Signed)
 Lvm

## 2024-08-22 ENCOUNTER — Ambulatory Visit (HOSPITAL_COMMUNITY)
Admission: RE | Admit: 2024-08-22 | Discharge: 2024-08-22 | Disposition: A | Discharge status: Home / Self Care | Source: Ambulatory Visit | Attending: Internal Medicine | Admitting: Internal Medicine

## 2024-08-22 ENCOUNTER — Encounter: Payer: Self-pay | Admitting: Internal Medicine

## 2024-08-22 ENCOUNTER — Ambulatory Visit (INDEPENDENT_AMBULATORY_CARE_PROVIDER_SITE_OTHER): Payer: Self-pay | Admitting: Internal Medicine

## 2024-08-22 VITALS — BP 123/71 | HR 93 | Ht <= 58 in | Wt 171.2 lb

## 2024-08-22 DIAGNOSIS — M129 Arthropathy, unspecified: Secondary | ICD-10-CM | POA: Insufficient documentation

## 2024-08-22 DIAGNOSIS — W19XXXA Unspecified fall, initial encounter: Secondary | ICD-10-CM | POA: Diagnosis not present

## 2024-08-22 DIAGNOSIS — M542 Cervicalgia: Secondary | ICD-10-CM | POA: Diagnosis not present

## 2024-08-22 DIAGNOSIS — M47816 Spondylosis without myelopathy or radiculopathy, lumbar region: Secondary | ICD-10-CM | POA: Insufficient documentation

## 2024-08-22 DIAGNOSIS — M545 Low back pain, unspecified: Secondary | ICD-10-CM | POA: Insufficient documentation

## 2024-08-22 DIAGNOSIS — S3219XA Other fracture of sacrum, initial encounter for closed fracture: Secondary | ICD-10-CM | POA: Diagnosis not present

## 2024-08-22 DIAGNOSIS — M4316 Spondylolisthesis, lumbar region: Secondary | ICD-10-CM | POA: Insufficient documentation

## 2024-08-22 MED ORDER — PREDNISONE 20 MG PO TABS: 40.0000 mg | ORAL_TABLET | Freq: Every day | ORAL | 0 refills | Status: DC

## 2024-08-22 MED ORDER — KETOROLAC TROMETHAMINE 60 MG/2ML IM SOLN
60.0000 mg | Freq: Once | INTRAMUSCULAR | Status: AC
  Administered 2024-08-22: 60 mg via INTRAMUSCULAR

## 2024-08-22 MED ORDER — METHYLPREDNISOLONE ACETATE 80 MG/ML IJ SUSP
80.0000 mg | Freq: Once | INTRAMUSCULAR | Status: AC
  Administered 2024-08-22: 80 mg via INTRAMUSCULAR

## 2024-08-22 NOTE — Patient Instructions (Addendum)
 Please take Prednisone  as prescribed.  Please take Tylenol  arthritis as needed for pain.  Please avoid heavy lifting and/or frequent bending.  Please get X-ray of lumbar spine and Sacrum at [facility].
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
## 2024-08-22 NOTE — Assessment & Plan Note (Signed)
 Due to recent mechanical fall Check x-ray of lumbar spine and sacrum/coccyx Depo-Medrol  and Toradol  IM today Oral prednisone  40 mg QD x 5 days Would avoid muscle relaxers due to her age for now Advised to perform warm compresses and use back brace as needed Avoid heavy lifting and frequent bending

## 2024-08-22 NOTE — Assessment & Plan Note (Signed)
 Left-sided neck pain since the fall Likely due to trapezius strain Advised to apply warm compresses Oral prednisone  would be helpful for neck pain as well

## 2024-08-22 NOTE — Assessment & Plan Note (Signed)
 Appears to be a mechanical fall due to uneven surface Will check x-ray of lumbar spine and sacrum/coccyx due to severe back pain No signs of scalp hematoma, she is A&O X 3, appears to be at baseline mental status and does not have other neurologic symptoms, would avoid CT of head now

## 2024-08-25 ENCOUNTER — Other Ambulatory Visit: Payer: Self-pay | Admitting: Family Medicine

## 2024-08-25 DIAGNOSIS — I10 Essential (primary) hypertension: Secondary | ICD-10-CM

## 2024-08-25 DIAGNOSIS — J302 Other seasonal allergic rhinitis: Secondary | ICD-10-CM

## 2024-08-25 DIAGNOSIS — M25471 Effusion, right ankle: Secondary | ICD-10-CM

## 2024-08-26 ENCOUNTER — Telehealth: Payer: Self-pay

## 2024-08-26 NOTE — Telephone Encounter (Signed)
 Copied from CRM [id number]. Topic: Clinical - Lab/Test Results >> Aug 25, 2024  5:41 PM Delon DASEN wrote: Reason for CRM: calling for results of xrays- [phone]

## 2024-08-26 NOTE — Telephone Encounter (Signed)
 Awaiting results

## 2024-09-01 ENCOUNTER — Other Ambulatory Visit: Payer: Self-pay | Admitting: Internal Medicine

## 2024-09-01 ENCOUNTER — Ambulatory Visit: Payer: Self-pay | Admitting: Internal Medicine

## 2024-09-01 ENCOUNTER — Telehealth: Payer: Self-pay

## 2024-09-01 DIAGNOSIS — M545 Low back pain, unspecified: Secondary | ICD-10-CM

## 2024-09-01 MED ORDER — CELECOXIB 100 MG PO CAPS: 100.0000 mg | ORAL_CAPSULE | Freq: Two times a day (BID) | ORAL | 0 refills | Status: AC

## 2024-09-01 NOTE — Addendum Note (Signed)
 Addended byBETHA TOBIE DOWNS on: 09/01/2024 08:13 AM   Modules accepted: Orders

## 2024-09-01 NOTE — Telephone Encounter (Signed)
 SPOKE TO PATIENT

## 2024-09-01 NOTE — Telephone Encounter (Signed)
 Copied from CRM [id number]. Topic: Clinical - Lab/Test Results >> Sep 01, 2024  8:34 AM Rosaria BRAVO wrote: Reason for CRM: Pt has questions about her lab results

## 2024-09-03 ENCOUNTER — Ambulatory Visit: Admitting: Orthopaedic Surgery

## 2024-09-03 ENCOUNTER — Encounter: Payer: Self-pay | Admitting: Orthopaedic Surgery

## 2024-09-03 VITALS — BP 129/81 | HR 86

## 2024-09-03 DIAGNOSIS — S3210XA Unspecified fracture of sacrum, initial encounter for closed fracture: Secondary | ICD-10-CM

## 2024-09-03 DIAGNOSIS — S322XXA Fracture of coccyx, initial encounter for closed fracture: Secondary | ICD-10-CM | POA: Diagnosis not present

## 2024-09-03 NOTE — Progress Notes (Signed)
 Subjective:    Patient ID: Kimberly Best, female    DOB: 1933-03-20, 89 y.o.   MRN: [id number]  HPI She fell on August 18, 2024 while in [place]  visiting the [employer] site.  She fell off a scooter.  She landed on her buttocks.  She had pain.  She was able to do the tour but was in pain.  She saw Dr. Tobie here and had X-rays done on August 29, 2024 as she was not improving.  X-rays showed: IMPRESSION: 1. Acute fracture involving the anterior border of the sacrococcygeal junction. 2. Degenerative changes of the lumbosacral spine with grade 1 anterolisthesis of L4 on L5 due to the moderate facet arthropathy.  She has a cane but her pain is lessening.  She is doing a little better.  Dr. Tobie gave her pain medicine yesterday.  She has no numbness.  She has pain after sitting a while.  She has no other injury.   Review of Systems  Constitutional:  Positive for activity change.  Musculoskeletal:  Positive for arthralgias, back pain, gait problem and myalgias.  All other systems reviewed and are negative. For Review of Systems, all other systems reviewed and are negative.  The following is a summary of the past history medically, past history surgically, known current medicines, social history and family history.  This information is gathered electronically by the computer from prior information and documentation.  I review this each visit and have found including this information at this point in the chart is beneficial and informative.   Past Medical History:  Diagnosis Date   Allergy    Anxiety    Arthritis    Cancer (HCC)    right breast cancer   Diabetes mellitus without complication (HCC)    GERD (gastroesophageal reflux disease)    H. pylori infection 05/2020   S/p treatment with Pylera .  H. pylori stool antigen negative on 12/21/2020.   HLD (hyperlipidemia)    Hypertension    IDA (iron deficiency anemia)    Hg in 9-10 range since September 2019.   Personal history of  radiation therapy    Pessary maintenance 03/10/2015    Past Surgical History:  Procedure Laterality Date   BIOPSY  05/26/2020   Procedure: BIOPSY;  Surgeon: Shaaron Lamar HERO, MD;  Location: [facility];  Service: Endoscopy;;   BREAST BIOPSY Left 2013   fribrocystic change with calcs   BREAST LUMPECTOMY Right    CHOLECYSTECTOMY     COLONOSCOPY N/A 05/26/2020   Procedure: COLONOSCOPY;  Surgeon: Shaaron Lamar HERO, MD; Diverticulosis in sigmoid colon, friable colonic mucosa likely secondary to ASA/NSAID insult, exam was otherwise normal.     ESOPHAGOGASTRODUODENOSCOPY N/A 05/26/2020   Procedure: ESOPHAGOGASTRODUODENOSCOPY (EGD);  Surgeon: Shaaron Lamar HERO, MD;  Normal esophagus s/p dilation, mucosal changes in the stomach biopsied, normal examined duodenum.  Pathology revealed H. pylori.   EYE SURGERY     GIVENS CAPSULE STUDY N/A 02/16/2021   Procedure: GIVENS CAPSULE STUDY;  Surgeon: Shaaron Lamar HERO, MD;  gastric and duodenal bulbar erosions, 2 nonbleeding small bowel ulcers with benign appearance suspected to be secondary to chronic aspirin use.    Current Outpatient Medications on File Prior to Visit  Medication Sig Dispense Refill   ACCU-CHEK AVIVA PLUS test strip USE AS DIRECTED TO TEST TWICE DAILY. 100 strip 0   Accu-Chek Softclix Lancets lancets USE AS DIRECTED TO TEST TWICE DAILY. 100 each 0   acetaminophen  (TYLENOL ) 650 MG CR tablet Take 650  mg by mouth 2 (two) times daily as needed for pain.     amLODipine  (NORVASC ) 5 MG tablet TAKE ONE TABLET (5MG  TOTAL) BY MOUTH DAILY 30 tablet 1   celecoxib  (CELEBREX ) 100 MG capsule Take 1 capsule (100 mg total) by mouth 2 (two) times daily. 60 capsule 0   cetirizine  (ZYRTEC ) 10 MG tablet Take 1 tablet (10 mg total) by mouth at bedtime. 30 tablet 0   cholecalciferol (VITAMIN D ) 1000 UNITS tablet Take 1,000 Units by mouth daily.     clotrimazole -betamethasone  (LOTRISONE ) cream For up to 14 days and then stop the medication. 45 g 0   furosemide   (LASIX ) 20 MG tablet TAKE ONE (1) TABLET BY MOUTH EVERY DAY 14 tablet 0   glimepiride  (AMARYL ) 4 MG tablet TAKE ONE TABLET (4MG  TOTAL) BY MOUTH DAILY WITH BREAKFAST 60 tablet 3   glucose blood (ONETOUCH ULTRA) test strip USE DAILY AS DIRECTED 100 each 10   JARDIANCE  25 MG TABS tablet TAKE ONE TABLET (25MG  TOTAL) BY MOUTH DAILY BEFORE BREAKFAST 30 tablet 3   meclizine  (ANTIVERT ) 25 MG tablet Take 1 tablet (25 mg total) by mouth 3 (three) times daily as needed for dizziness. 30 tablet 2   montelukast  (SINGULAIR ) 10 MG tablet TAKE ONE TABLET (10MG  TOTAL) BY MOUTH ATBEDTIME 30 tablet 2   Multiple Vitamins-Minerals (IMMUNE SUPPORT PO) Take 1 tablet by mouth daily.     pantoprazole  (PROTONIX ) 40 MG tablet TAKE ONE TABLET (40MG ) BY MOUTH DAILY 90 tablet 0   potassium chloride  (KLOR-CON  M) 10 MEQ tablet Take 1 tablet (10 mEq total) by mouth 2 (two) times daily. 90 tablet 1   rosuvastatin  (CRESTOR ) 5 MG tablet TAKE ONE (1) TABLET BY MOUTH EVERY DAY 90 tablet 1   SLOW FE 142 (45 Fe) MG TBCR Take 2 tablets by mouth every morning. 180 tablet 1   triamcinolone  (NASACORT ) 55 MCG/ACT AERO nasal inhaler Place 2 sprays into the nose daily. 1 each 12   No current facility-administered medications on file prior to visit.    Social History   Socioeconomic History   Marital status: Widowed    Spouse name: Not on file   Number of children: Not on file   Years of education: Not on file   Highest education level: Not on file  Occupational History   Not on file  Tobacco Use   Smoking status: Never   Smokeless tobacco: Never  Vaping Use   Vaping status: Never Used  Substance and Sexual Activity   Alcohol use: Not Currently    Comment: Couple ounces of champagne once every couple of weeks or less.   Drug use: No   Sexual activity: Not Currently    Birth control/protection: Post-menopausal, Abstinence  Other Topics Concern   Not on file  Social History Narrative   Widow since 2015,married for 56  years.Lives alone.Retired.   Social Drivers of Health   Financial Resource Strain: Medium Risk (09/26/2023)   Overall Financial Resource Strain (CARDIA)    Difficulty of Paying Living Expenses: Somewhat hard  Food Insecurity: No Food Insecurity (09/26/2023)   Hunger Vital Sign    Worried About Running Out of Food in the Last Year: Never true    Ran Out of Food in the Last Year: Never true  Transportation Needs: No Transportation Needs (09/26/2023)   PRAPARE - Administrator, Civil Service (Medical): No    Lack of Transportation (Non-Medical): No  Physical Activity: Insufficiently Active (09/26/2023)   Exercise Vital  Sign    Days of Exercise per Week: 3 days    Minutes of Exercise per Session: 30 min  Stress: No Stress Concern Present (09/26/2023)   [employer] of Occupational Health - Occupational Stress Questionnaire    Feeling of Stress : Only a little  Social Connections: Unknown (09/26/2023)   Social Connection and Isolation Panel    Frequency of Communication with Friends and Family: More than three times a week    Frequency of Social Gatherings with Friends and Family: More than three times a week    Attends Religious Services: More than 4 times per year    Active Member of [employer] or Organizations: No    Attends Banker Meetings: Never    Marital Status: Patient declined  Catering manager Violence: Not At Risk (09/26/2023)   Humiliation, Afraid, Rape, and Kick questionnaire    Fear of Current or Ex-Partner: No    Emotionally Abused: No    Physically Abused: No    Sexually Abused: No    Family History  Problem Relation Age of Onset   Cancer Father    Stroke Mother    Hypertension Mother    Diabetes Sister    Hypertension Sister    Cancer Brother    Hypertension Brother    Bipolar disorder Daughter    Hypertension Maternal Grandmother    Tuberculosis Paternal Grandmother    Hypertension Paternal Grandmother    Alcohol abuse Paternal  Grandfather    Hypertension Paternal Grandfather    Colon cancer Neg Hx     BP 129/81   Pulse 86   There is no height or weight on file to calculate BMI.      Objective:   Physical Exam Vitals and nursing note reviewed. Exam conducted with a chaperone present.  Constitutional:      Appearance: She is well-developed.  HENT:     Head: Normocephalic and atraumatic.  Eyes:     Conjunctiva/sclera: Conjunctivae normal.     Pupils: Pupils are equal, round, and reactive to light.  Cardiovascular:     Rate and Rhythm: Normal rate and regular rhythm.  Pulmonary:     Effort: Pulmonary effort is normal.  Abdominal:     Palpations: Abdomen is soft.  Musculoskeletal:       Arms:     Cervical back: Normal range of motion and neck supple.  Skin:    General: Skin is warm and dry.  Neurological:     Mental Status: She is alert and oriented to person, place, and time.     Cranial Nerves: No cranial nerve deficit.     Motor: No abnormal muscle tone.     Coordination: Coordination normal.     Deep Tendon Reflexes: Reflexes are normal and symmetric. Reflexes normal.  Psychiatric:        Behavior: Behavior normal.        Thought Content: Thought content normal.        Judgment: Judgment normal.           Assessment & Plan:   Encounter Diagnosis  Name Primary?   Closed fracture of sacrum and coccyx, initial encounter (HCC) Yes   I have explained the fracture to her.  She is improving.  She is to use her cane while walking and may need to lay down when the pain is worse.  Continue her pain medicine.  I have informed the patient I will be retiring from medical practice and from this office on  September 11, 2024.  The patient has been offered continuing care with Dr. Margrette or Dr. Onesimo of this office.  The patient may choose another provider and the records will be forwarded after proper signature and notification.  Patient understands and agrees.  She will follow up with  Dr. Margrette.  Get X-rays of sacrum then.  Call if any problem.  Precautions discussed.  Electronically Signed Lemond Stable, MD 9/24/20258:43 AM

## 2024-09-03 NOTE — Patient Instructions (Signed)
 Follow up three weeks with either Onesimo or [facility] in 3 weeks for follow up.

## 2024-09-05 ENCOUNTER — Other Ambulatory Visit: Payer: Self-pay | Admitting: Internal Medicine

## 2024-09-05 DIAGNOSIS — M545 Low back pain, unspecified: Secondary | ICD-10-CM

## 2024-09-22 ENCOUNTER — Other Ambulatory Visit: Payer: Self-pay

## 2024-09-22 DIAGNOSIS — K219 Gastro-esophageal reflux disease without esophagitis: Secondary | ICD-10-CM

## 2024-09-22 DIAGNOSIS — M25471 Effusion, right ankle: Secondary | ICD-10-CM

## 2024-09-22 MED ORDER — PANTOPRAZOLE SODIUM 40 MG PO TBEC: DELAYED_RELEASE_TABLET | ORAL | 0 refills | Status: DC

## 2024-09-22 MED ORDER — FUROSEMIDE 20 MG PO TABS: 20.0000 mg | ORAL_TABLET | Freq: Every day | ORAL | 0 refills | Status: DC

## 2024-09-24 DIAGNOSIS — S3210XA Unspecified fracture of sacrum, initial encounter for closed fracture: Secondary | ICD-10-CM | POA: Insufficient documentation

## 2024-09-25 ENCOUNTER — Ambulatory Visit: Admitting: Orthopedic Surgery

## 2024-09-25 ENCOUNTER — Encounter: Payer: Self-pay | Admitting: Orthopedic Surgery

## 2024-09-25 VITALS — Ht <= 58 in | Wt 171.0 lb

## 2024-09-25 DIAGNOSIS — S3210XD Unspecified fracture of sacrum, subsequent encounter for fracture with routine healing: Secondary | ICD-10-CM

## 2024-09-25 DIAGNOSIS — S322XXD Fracture of coccyx, subsequent encounter for fracture with routine healing: Secondary | ICD-10-CM

## 2024-09-25 NOTE — Progress Notes (Signed)
    09/25/2024   Chief Complaint  Patient presents with   Back Pain    Sacral fracture     Encounter Diagnosis  Name Primary?   Closed fracture of sacrum and coccyx with routine healing, subsequent encounter 08/18/24 Yes    What pharmacy do you use ? _______[facility] ____________________  DOI/DOS/ Date:    Improved

## 2024-09-25 NOTE — Progress Notes (Signed)
    09/25/2024   Chief Complaint  Patient presents with   Back Pain    Sacral fracture     Encounter Diagnosis  Name Primary?   Closed fracture of sacrum and coccyx with routine healing, subsequent encounter 08/18/24 Yes    What pharmacy do you use ? _______[facility] ____________________  DOI/DOS/ Date:    Improved  Follow-up fracture care sacral fracture  Kimberly Best is doing well she has returned to normal ambulation with a cane  Clinical exam shows minimal discomfort with palpation in the lower back and sacral region  She should do well and does not need further treatment

## 2024-09-30 ENCOUNTER — Other Ambulatory Visit: Payer: Self-pay | Admitting: Family Medicine

## 2024-09-30 DIAGNOSIS — R42 Dizziness and giddiness: Secondary | ICD-10-CM

## 2024-10-01 ENCOUNTER — Ambulatory Visit

## 2024-10-01 VITALS — BP 134/70 | Ht <= 58 in | Wt 170.0 lb

## 2024-10-01 DIAGNOSIS — Z0000 Encounter for general adult medical examination without abnormal findings: Secondary | ICD-10-CM

## 2024-10-01 NOTE — Patient Instructions (Signed)
 Kimberly Best,  Thank you for taking the time for your Medicare Wellness Visit. I appreciate your continued commitment to your health goals. Please review the care plan we discussed, and feel free to reach out if I can assist you further.  Medicare recommends these wellness visits once per year to help you and your care team stay ahead of potential health issues. These visits are designed to focus on prevention, allowing your provider to concentrate on managing your acute and chronic conditions during your regular appointments.  Please note that Annual Wellness Visits do not include a physical exam. Some assessments may be limited, especially if the visit was conducted virtually. If needed, we may recommend a separate in-person follow-up with your provider.  Wishing you excellent health and many blessings in the year to come!  -Quinnlan Abruzzo, CMA  Ongoing Care Seeing your primary care provider every 3 to 6 months helps us  monitor your health and provide consistent, personalized care.   Recommended Screenings:  Health Maintenance  Topic Date Due   Zoster (Shingles) Vaccine (1 of 2) Never done   Flu Shot  07/11/2024   COVID-19 Vaccine (1 - 2025-26 season) Never done   Medicare Annual Wellness Visit  09/25/2024   Complete foot exam   10/22/2024   Hemoglobin A1C  11/11/2024   Eye exam for diabetics  01/08/2025   DTaP/Tdap/Td vaccine (2 - Td or Tdap) 06/16/2030   Pneumococcal Vaccine for age over 70  Completed   DEXA scan (bone density measurement)  Completed   Meningitis B Vaccine  Aged Out       10/01/2024   11:28 AM  Advanced Directives  Does Patient Have a Medical Advance Directive? No  Would patient like information on creating a medical advance directive? No - Patient declined   Advance Care Planning is important because it: Ensures you receive medical care that aligns with your values, goals, and preferences. Provides guidance to your family and loved ones, reducing the emotional burden  of decision-making during critical moments.  Vision: Annual vision screenings are recommended for early detection of glaucoma, cataracts, and diabetic retinopathy. These exams can also reveal signs of chronic conditions such as diabetes and high blood pressure.  Dental: Annual dental screenings help detect early signs of oral cancer, gum disease, and other conditions linked to overall health, including heart disease and diabetes.  Please see the attached documents for additional preventive care recommendations.

## 2024-10-01 NOTE — Progress Notes (Signed)
 Please attest and cosign this visit due to patients primary care provider not being in the office at the time the visit was completed.   Subjective:   Kimberly Best is a 89 y.o. who presents for a Medicare Wellness preventive visit.  As a reminder, Annual Wellness Visits don't include a physical exam, and some assessments may be limited, especially if this visit is performed virtually. We may recommend an in-person follow-up visit with your provider if needed.  Visit Complete: Virtual I connected with  Kimberly Best on 10/01/24 by a audio enabled telemedicine application and verified that I am speaking with the correct person using two identifiers.  Patient Location: Home  Provider Location: Office/Clinic  I discussed the limitations of evaluation and management by telemedicine. The patient expressed understanding and agreed to proceed.  Vital Signs: Because this visit was a virtual/telehealth visit, some criteria may be missing or patient reported. Any vitals not documented were not able to be obtained and vitals that have been documented are patient reported.  VideoDeclined- This patient declined Librarian, academic. Therefore the visit was completed with audio only.  Persons Participating in Visit: Patient.  AWV Questionnaire: No: Patient Medicare AWV questionnaire was not completed prior to this visit.  Cardiac Risk Factors include: advanced age (>75men, >36 women);diabetes mellitus;dyslipidemia;hypertension;obesity (BMI >30kg/m2)     Objective:    Today's Vitals   10/01/24 1131  BP: 134/70  Weight: 170 lb (77.1 kg)  Height: 4' 8 (1.422 m)  PainSc: 7   PainLoc: Buttocks   Body mass index is 38.11 kg/m.     10/01/2024   11:28 AM 09/05/2022    3:45 PM 12/20/2021    4:48 PM 07/18/2021    8:49 AM 06/28/2021    9:21 PM 06/15/2021   10:23 AM 06/04/2020    8:12 AM  Advanced Directives  Does Patient Have a Medical Advance Directive? No No No No  No;Yes No No  Type of Advance Directive     Living will    Would patient like information on creating a medical advance directive? No - Patient declined No - Patient declined No - Patient declined No - Patient declined  Yes (MAU/Ambulatory/Procedural Areas - Information given) No - Patient declined    Current Medications (verified) Outpatient Encounter Medications as of 10/01/2024  Medication Sig   ACCU-CHEK AVIVA PLUS test strip USE AS DIRECTED TO TEST TWICE DAILY.   Accu-Chek Softclix Lancets lancets USE AS DIRECTED TO TEST TWICE DAILY.   acetaminophen  (TYLENOL ) 650 MG CR tablet Take 650 mg by mouth 2 (two) times daily as needed for pain.   amLODipine  (NORVASC ) 5 MG tablet TAKE ONE TABLET (5MG  TOTAL) BY MOUTH DAILY   celecoxib  (CELEBREX ) 100 MG capsule Take 1 capsule (100 mg total) by mouth 2 (two) times daily.   cetirizine  (ZYRTEC ) 10 MG tablet Take 1 tablet (10 mg total) by mouth at bedtime.   cholecalciferol (VITAMIN D ) 1000 UNITS tablet Take 1,000 Units by mouth daily.   clotrimazole -betamethasone  (LOTRISONE ) cream For up to 14 days and then stop the medication.   furosemide  (LASIX ) 20 MG tablet Take 1 tablet (20 mg total) by mouth daily.   glimepiride  (AMARYL ) 4 MG tablet TAKE ONE TABLET (4MG  TOTAL) BY MOUTH DAILY WITH BREAKFAST   glucose blood (ONETOUCH ULTRA) test strip USE DAILY AS DIRECTED   JARDIANCE  25 MG TABS tablet TAKE ONE TABLET (25MG  TOTAL) BY MOUTH DAILY BEFORE BREAKFAST   meclizine  (ANTIVERT ) 25 MG tablet TAKE  ONE TABLET (25MG  TOTAL) BY MOUTH THREE TIMES DAILY AS NEEDED FOR DIZZINESS   montelukast  (SINGULAIR ) 10 MG tablet TAKE ONE TABLET (10MG  TOTAL) BY MOUTH ATBEDTIME   Multiple Vitamins-Minerals (IMMUNE SUPPORT PO) Take 1 tablet by mouth daily.   pantoprazole  (PROTONIX ) 40 MG tablet TAKE ONE TABLET (40MG ) BY MOUTH DAILY   potassium chloride  (KLOR-CON  M) 10 MEQ tablet Take 1 tablet (10 mEq total) by mouth 2 (two) times daily.   potassium chloride  (KLOR-CON ) 10 MEQ tablet  Take 10 mEq by mouth 2 (two) times daily.   rosuvastatin  (CRESTOR ) 5 MG tablet TAKE ONE (1) TABLET BY MOUTH EVERY DAY   SLOW FE 142 (45 Fe) MG TBCR Take 2 tablets by mouth every morning.   triamcinolone  (NASACORT ) 55 MCG/ACT AERO nasal inhaler Place 2 sprays into the nose daily.   No facility-administered encounter medications on file as of 10/01/2024.    Allergies (verified) Lisinopril  and Penicillins   History: Past Medical History:  Diagnosis Date   Allergy    Anxiety    Arthritis    Cancer (HCC)    right breast cancer   Diabetes mellitus without complication (HCC)    GERD (gastroesophageal reflux disease)    H. pylori infection 05/2020   S/p treatment with Pylera .  H. pylori stool antigen negative on 12/21/2020.   HLD (hyperlipidemia)    Hypertension    IDA (iron deficiency anemia)    Hg in 9-10 range since September 2019.   Personal history of radiation therapy    Pessary maintenance 03/10/2015   Past Surgical History:  Procedure Laterality Date   BIOPSY  05/26/2020   Procedure: BIOPSY;  Surgeon: Shaaron Lamar HERO, MD;  Location: [facility];  Service: Endoscopy;;   BREAST BIOPSY Left 2013   fribrocystic change with calcs   BREAST LUMPECTOMY Right    CHOLECYSTECTOMY     COLONOSCOPY N/A 05/26/2020   Procedure: COLONOSCOPY;  Surgeon: Shaaron Lamar HERO, MD; Diverticulosis in sigmoid colon, friable colonic mucosa likely secondary to ASA/NSAID insult, exam was otherwise normal.     ESOPHAGOGASTRODUODENOSCOPY N/A 05/26/2020   Procedure: ESOPHAGOGASTRODUODENOSCOPY (EGD);  Surgeon: Shaaron Lamar HERO, MD;  Normal esophagus s/p dilation, mucosal changes in the stomach biopsied, normal examined duodenum.  Pathology revealed H. pylori.   EYE SURGERY     GIVENS CAPSULE STUDY N/A 02/16/2021   Procedure: GIVENS CAPSULE STUDY;  Surgeon: Shaaron Lamar HERO, MD;  gastric and duodenal bulbar erosions, 2 nonbleeding small bowel ulcers with benign appearance suspected to be secondary to chronic  aspirin use.   Family History  Problem Relation Age of Onset   Cancer Father    Stroke Mother    Hypertension Mother    Diabetes Sister    Hypertension Sister    Cancer Brother    Hypertension Brother    Bipolar disorder Daughter    Hypertension Maternal Grandmother    Tuberculosis Paternal Grandmother    Hypertension Paternal Grandmother    Alcohol abuse Paternal Grandfather    Hypertension Paternal Grandfather    Colon cancer Neg Hx    Social History   Socioeconomic History   Marital status: Widowed    Spouse name: Not on file   Number of children: Not on file   Years of education: Not on file   Highest education level: Not on file  Occupational History   Not on file  Tobacco Use   Smoking status: Never   Smokeless tobacco: Never  Vaping Use   Vaping status: Never Used  Substance and Sexual Activity   Alcohol use: Not Currently    Comment: Couple ounces of champagne once every couple of weeks or less.   Drug use: No   Sexual activity: Not Currently    Birth control/protection: Post-menopausal, Abstinence  Other Topics Concern   Not on file  Social History Narrative   Widow since 2015,married for 56 years.Lives alone.Retired.   Social Drivers of Health   Financial Resource Strain: Medium Risk (09/26/2023)   Overall Financial Resource Strain (CARDIA)    Difficulty of Paying Living Expenses: Somewhat hard  Food Insecurity: No Food Insecurity (09/26/2023)   Hunger Vital Sign    Worried About Running Out of Food in the Last Year: Never true    Ran Out of Food in the Last Year: Never true  Transportation Needs: No Transportation Needs (09/26/2023)   PRAPARE - Administrator, Civil Service (Medical): No    Lack of Transportation (Non-Medical): No  Physical Activity: Insufficiently Active (10/01/2024)   Exercise Vital Sign    Days of Exercise per Week: 3 days    Minutes of Exercise per Session: 20 min  Stress: No Stress Concern Present (10/01/2024)    [employer] of Occupational Health - Occupational Stress Questionnaire    Feeling of Stress: Not at all  Social Connections: Unknown (09/26/2023)   Social Connection and Isolation Panel    Frequency of Communication with Friends and Family: More than three times a week    Frequency of Social Gatherings with Friends and Family: More than three times a week    Attends Religious Services: More than 4 times per year    Active Member of [employer] or Organizations: No    Attends Engineer, structural: Never    Marital Status: Patient declined    Tobacco Counseling Counseling given: Yes    Clinical Intake:  Pre-visit preparation completed: Yes  Pain : No/denies pain Pain Score: 7  Pain Location: Coccyx     BMI - recorded: 38.11 Nutritional Status: BMI > 30  Obese Nutritional Risks: None Diabetes: Yes CBG done?: No (telehealth visit) Did pt. bring in CBG monitor from home?: No  Lab Results  Component Value Date   HGBA1C 6.3 (H) 05/12/2024   HGBA1C 6.4 (H) 02/22/2024   HGBA1C 7.0 (H) 10/29/2023     How often do you need to have someone help you when you read instructions, pamphlets, or other written materials from your doctor or pharmacy?: 1 - Never  Interpreter Needed?: No  Information entered by :: Azariel Banik W CMA (AAMA)   Activities of Daily Living     10/01/2024   11:47 AM  In your present state of health, do you have any difficulty performing the following activities:  Hearing? 1  Comment wears hearing  Vision? 0  Difficulty concentrating or making decisions? 0  Walking or climbing stairs? 1  Comment fractured coccyx  Dressing or bathing? 0  Doing errands, shopping? 0  Preparing Food and eating ? N  Using the Toilet? N  In the past six months, have you accidently leaked urine? N  Do you have problems with loss of bowel control? N  Managing your Medications? N  Managing your Finances? N  Housekeeping or managing your Housekeeping? Y    Patient  Care Team: Bacchus, Meade PEDLAR, FNP as PCP - General (Family Medicine) Shaaron, Lamar HERO, MD as Consulting Physician (Gastroenterology) [facility]  I have updated your Care Teams any recent Medical Services you  may have received from other providers in the past year.     Assessment:   This is a routine wellness examination for Kimberly Best.  Hearing/Vision screen Hearing Screening - Comments:: Patient wears hearing aids.  Vision Screening - Comments:: Wears rx glasses - up to date with routine eye exams with  [facility]   Goals Addressed               This Visit's Progress     Remain active, healthy, and independent. (pt-stated)          Depression Screen     10/01/2024   11:59 AM 08/22/2024   11:16 AM 08/14/2024    2:17 PM 05/12/2024    9:43 AM 11/16/2023    3:13 PM 10/05/2023    1:35 PM 09/26/2023    2:56 PM  PHQ 2/9 Scores  PHQ - 2 Score 0 0 0 0 0 0 0  PHQ- 9 Score 0 0 0 0 0 0 0     Fall Risk     10/01/2024   11:43 AM 08/22/2024   11:16 AM 08/14/2024    2:17 PM 06/04/2024    1:40 PM 05/12/2024    9:43 AM  Fall Risk   Falls in the past year? 1 1 0 0 0  Number falls in past yr: 1 1 0 0 0  Injury with Fall? 1 1 0 0 0  Risk for fall due to : History of fall(s);Impaired balance/gait;Impaired mobility Other (Comment) No Fall Risks  No Fall Risks  Follow up Falls evaluation completed;Education provided;Falls prevention discussed Falls evaluation completed Falls evaluation completed;Education provided;Falls prevention discussed Falls evaluation completed Falls evaluation completed    MEDICARE RISK AT HOME:  Medicare Risk at Home Any stairs in or around the home?: Yes If so, are there any without handrails?: No Home free of loose throw rugs in walkways, pet beds, electrical cords, etc?: Yes Adequate lighting in your home to reduce risk of falls?: Yes Life alert?: No Use of a cane, walker or w/c?: Yes Grab bars in the bathroom?: Yes Shower chair or  bench in shower?: Yes Elevated toilet seat or a handicapped toilet?: No  TIMED UP AND GO:  Was the test performed?  No  Cognitive Function: 6CIT completed    09/05/2022    3:48 PM  MMSE - Mini Mental State Exam  Not completed: Unable to complete        10/01/2024   11:51 AM 09/26/2023    2:56 PM 09/05/2022    3:48 PM 06/15/2021   10:27 AM 06/04/2020    8:19 AM  6CIT Screen  What Year? 0 points 0 points 0 points 0 points 0 points  What month? 0 points 0 points 0 points 0 points 0 points  What time? 0 points 0 points 0 points 0 points 0 points  Count back from 20 0 points 0 points 2 points 2 points 2 points  Months in reverse 0 points 0 points 2 points 0 points 0 points  Repeat phrase 0 points 0 points 0 points 2 points 8 points  Total Score 0 points 0 points 4 points 4 points 10 points    Immunizations Immunization History  Administered Date(s) Administered   Fluad Quad(high Dose 65+) 12/13/2022, 01/15/2024   INFLUENZA, HIGH DOSE SEASONAL PF 09/21/2020   Influenza-Unspecified 09/27/2016, 09/30/2018, 08/21/2019   Pneumococcal Conjugate-13 08/18/2014   Pneumococcal Polysaccharide-23 08/24/2015   Tdap 06/16/2020    Screening Tests Health Maintenance  Topic  Date Due   Zoster Vaccines- Shingrix (1 of 2) Never done   Influenza Vaccine  07/11/2024   COVID-19 Vaccine (1 - 2025-26 season) Never done   Medicare Annual Wellness (AWV)  09/25/2024   FOOT EXAM  10/22/2024   HEMOGLOBIN A1C  11/11/2024   OPHTHALMOLOGY EXAM  01/08/2025   DTaP/Tdap/Td (2 - Td or Tdap) 06/16/2030   Pneumococcal Vaccine: 50+ Years  Completed   DEXA SCAN  Completed   Meningococcal B Vaccine  Aged Out    Health Maintenance Health Maintenance Due  Topic Date Due   Zoster Vaccines- Shingrix (1 of 2) Never done   Influenza Vaccine  07/11/2024   COVID-19 Vaccine (1 - 2025-26 season) Never done   Medicare Annual Wellness (AWV)  09/25/2024   Health Maintenance Items Addressed: Patient advised of  recommended vaccines and where to obtain those vaccines with verbal understanding  Additional Screening:  Vision Screening: Recommended annual ophthalmology exams for early detection of glaucoma and other disorders of the eye. Would you like a referral to an eye doctor? No    Dental Screening: Recommended annual dental exams for proper oral hygiene  Community Resource Referral / Chronic Care Management: CRR required this visit?  No   CCM required this visit?  No   Plan:    I have personally reviewed and noted the following in the patient's chart:   Medical and social history Use of alcohol, tobacco or illicit drugs  Current medications and supplements including opioid prescriptions. Patient is not currently taking opioid prescriptions. Functional ability and status Nutritional status Physical activity Advanced directives List of other physicians Hospitalizations, surgeries, and ER visits in previous 12 months Vitals Screenings to include cognitive, depression, and falls Referrals and appointments  In addition, I have reviewed and discussed with patient certain preventive protocols, quality metrics, and best practice recommendations. A written personalized care plan for preventive services as well as general preventive health recommendations were provided to patient.   Mega Kinkade, CMA   10/01/2024   After Visit Summary: (Mail) Due to this being a telephonic visit, the after visit summary with patients personalized plan was offered to patient via mail   Notes: Nothing significant to report at this time.

## 2024-10-02 ENCOUNTER — Other Ambulatory Visit: Payer: Self-pay | Admitting: Family Medicine

## 2024-10-07 ENCOUNTER — Ambulatory Visit: Payer: Self-pay | Admitting: *Deleted

## 2024-10-07 NOTE — Telephone Encounter (Signed)
 FYI Only or Action Required?: FYI only for provider.  Patient was last seen in primary care on 08/22/2024 by Tobie Suzzane POUR, MD.  Called Nurse Triage reporting Pain.  Symptoms began a week ago.  Interventions attempted: Prescription medications: meclizine  with minimal relief and Rest, hydration, or home remedies.  Symptoms are: gradually worsening.  Triage Disposition: See PCP When Office is Open (Within 3 Days)1-3 days  Patient/caregiver understands and will follow disposition?: Yes        Copied from CRM [id number]. Topic: Clinical - Red Word Triage >> Oct 07, 2024  9:16 AM Kimberly Best wrote: Red Word that prompted transfer to Nurse Triage: Patient with sever head pain, bloody nose. States not vertigo related pain. Pain is now radiating to neck/jaw Reason for Disposition  [1] MODERATE dizziness (e.g., interferes with normal activities) AND [2] has been evaluated by doctor (or NP/PA) for this  Answer Assessment - Initial Assessment Questions No available appt with PCP. Scheduled appt with other provider tomorrow. Recommended if sx worsen call back or go to UC/ED.     1. DESCRIPTION: Describe your dizziness.     Lightheaded feels woozy when laying down flat  2. LIGHTHEADED: Do you feel lightheaded? (e.g., somewhat faint, woozy, weak upon standing)     Woozy, laying down feels lightheaded 3. VERTIGO: Do you feel like either you or the room is spinning or tilting? (i.e., vertigo)     No  4. SEVERITY: How bad is it?  Do you feel like you are going to faint? Can you stand and walk?    Can stand and walk 5. ONSET:  When did the dizziness begin?     Last week  6. AGGRAVATING FACTORS: Does anything make it worse? (e.g., standing, change in head position)     Laying down  or look down small amount of dizziness 7. HEART RATE: Can you tell me your heart rate? How many beats in 15 seconds?  (Note: Not all patients can do this.)       na 8. CAUSE: What do you think  is causing the dizziness? (e.g., decreased fluids or food, diarrhea, emotional distress, heat exposure, new medicine, sudden standing, vomiting; unknown)     Not sure is eating and drinking well . BP and blood sugar WNL 9. RECURRENT SYMPTOM: Have you had dizziness before? If Yes, ask: When was the last time? What happened that time?     Yes was vertigo but this feels different than vertigo  10. OTHER SYMPTOMS: Do you have any other symptoms? (e.g., fever, chest pain, vomiting, diarrhea, bleeding)       No chest pain no difficulty breathing no fever no blurred vision no weakness either side of body. Reports removing blood clot from nose yesterday but no active bleeding. Eyes red. Cheek bones stinging from nose and pain radiates to neck and jaw area. BP 133/70 something today. Blood sugar today 117. Took meclizine  last night with minimal relief. Reports moderate pain in tail bone and soreness persists.  11. PREGNANCY: Is there any chance you are pregnant? When was your last menstrual period?       na  Protocols used: Dizziness - Lightheadedness-A-AH

## 2024-10-07 NOTE — Telephone Encounter (Signed)
Noted, patient scheduled.

## 2024-10-08 ENCOUNTER — Ambulatory Visit: Payer: Self-pay | Admitting: Family Medicine

## 2024-10-09 ENCOUNTER — Ambulatory Visit (INDEPENDENT_AMBULATORY_CARE_PROVIDER_SITE_OTHER): Payer: Self-pay | Admitting: Family Medicine

## 2024-10-09 ENCOUNTER — Encounter: Payer: Self-pay | Admitting: Family Medicine

## 2024-10-09 VITALS — BP 146/70 | HR 91 | Ht <= 58 in | Wt 171.0 lb

## 2024-10-09 DIAGNOSIS — R42 Dizziness and giddiness: Secondary | ICD-10-CM | POA: Diagnosis not present

## 2024-10-09 MED ORDER — LEVOCETIRIZINE DIHYDROCHLORIDE 5 MG PO TABS: 5.0000 mg | ORAL_TABLET | Freq: Every evening | ORAL | 1 refills | Status: DC

## 2024-10-09 MED ORDER — IPRATROPIUM BROMIDE 0.06 % NA SOLN: 2.0000 | Freq: Four times a day (QID) | NASAL | 0 refills | Status: DC | PRN

## 2024-10-09 NOTE — Patient Instructions (Signed)
 Medications as directed.  Referral placed to vestibular rehab.  Follow up with Meade.

## 2024-10-12 DIAGNOSIS — R42 Dizziness and giddiness: Secondary | ICD-10-CM | POA: Insufficient documentation

## 2024-10-12 NOTE — Assessment & Plan Note (Signed)
 Nonspecific.  Atrovent and Xyzal for upper respiratory symptoms.  Referring to physical therapy for vestibular rehab.

## 2024-10-12 NOTE — Progress Notes (Signed)
 Subjective:  Patient ID: Kimberly Best, female    DOB: [birth year]  Age: 89 y.o. MRN: [id number]  CC:   Chief Complaint  Patient presents with   Dizziness    Dizzy when laying down , she does not feel it is vertigo    Tailbone Pain    Patient had a recent fall     HPI:  89 year old female presents for evaluation of the above.  Patient recently had a fracture of the sacrum.  She is followed by orthopedics.    Patient reports several complaints today.  She reports a burning sensation in her face.  Reports congestion particularly in the nose. Reports dizziness but states that it does not feel like vertigo.  She has had symptoms for months.  Has been treated with antibiotics without resolution.   Patient Active Problem List   Diagnosis Date Noted   Dizziness 10/12/2024   Fx sacrum/coccyx-closed (HCC) 09/24/2024   Osteoarthritis of left hip 05/12/2024   Stress due to family tension 07/24/2023   Tension headache 07/24/2023   Acute adjustment disorder with anxiety 06/26/2023   Encounter for annual general medical examination with abnormal findings in adult 12/18/2022   Essential hypertension 04/13/2022   Seasonal allergies 04/13/2022   Environmental allergies 01/30/2022   Morbid obesity (HCC) 12/06/2021   Gastric erosion    H. pylori infection 07/14/2020   NSAID long-term use 07/14/2020   Anemia 04/14/2020   Pessary maintenance 04/14/2020   Gastroesophageal reflux disease 01/14/2020   Osteoarthritis of right shoulder 01/14/2020   Osteoporosis 09/16/2019   Controlled diabetes mellitus type 2 with complications (HCC) 09/16/2019   Hyperlipidemia LDL goal <100 09/16/2019   Problem with vaginal pessary 09/03/2018   Fitting and adjustment of pessary 03/10/2015   Uterovaginal prolapse, complete 02/18/2015   Complete uterovaginal prolapse 02/01/2015   Female stress incontinence 06/10/2014   Urge incontinence 06/10/2014    Social Hx   Social History   Socioeconomic History    Marital status: Widowed    Spouse name: Not on file   Number of children: Not on file   Years of education: Not on file   Highest education level: Not on file  Occupational History   Not on file  Tobacco Use   Smoking status: Never   Smokeless tobacco: Never  Vaping Use   Vaping status: Never Used  Substance and Sexual Activity   Alcohol use: Not Currently    Comment: Couple ounces of champagne once every couple of weeks or less.   Drug use: No   Sexual activity: Not Currently    Birth control/protection: Post-menopausal, Abstinence  Other Topics Concern   Not on file  Social History Narrative   Widow since 2015,married for 56 years.Lives alone.Retired.   Social Drivers of Health   Financial Resource Strain: Medium Risk (09/26/2023)   Overall Financial Resource Strain (CARDIA)    Difficulty of Paying Living Expenses: Somewhat hard  Food Insecurity: No Food Insecurity (09/26/2023)   Hunger Vital Sign    Worried About Running Out of Food in the Last Year: Never true    Ran Out of Food in the Last Year: Never true  Transportation Needs: No Transportation Needs (09/26/2023)   PRAPARE - Administrator, Civil Service (Medical): No    Lack of Transportation (Non-Medical): No  Physical Activity: Insufficiently Active (10/01/2024)   Exercise Vital Sign    Days of Exercise per Week: 3 days    Minutes of Exercise per Session: 20 min  Stress: No Stress Concern Present (10/01/2024)   [employer] of Occupational Health - Occupational Stress Questionnaire    Feeling of Stress: Not at all  Social Connections: Unknown (09/26/2023)   Social Connection and Isolation Panel    Frequency of Communication with Friends and Family: More than three times a week    Frequency of Social Gatherings with Friends and Family: More than three times a week    Attends Religious Services: More than 4 times per year    Active Member of [employer] or Organizations: No    Attends Tax Inspector Meetings: Never    Marital Status: Patient declined    Review of Systems Per HPI  Objective:  BP (!) 146/70   Pulse 91   Ht 4' 8 (1.422 m)   Wt 171 lb (77.6 kg)   SpO2 96%   BMI 38.34 kg/m      10/09/2024   11:13 AM 10/01/2024   11:31 AM 09/25/2024   11:17 AM  BP/Weight  Systolic BP 146 134   Diastolic BP 70 70   Wt. (Lbs) 171 170 171  BMI 38.34 kg/m2 38.11 kg/m2 38.34 kg/m2    Physical Exam Constitutional:      General: She is not in acute distress.    Appearance: Normal appearance.  HENT:     Head: Normocephalic and atraumatic.     Nose: Congestion present.  Cardiovascular:     Rate and Rhythm: Normal rate and regular rhythm.  Pulmonary:     Effort: Pulmonary effort is normal.     Breath sounds: Normal breath sounds. No wheezing or rales.  Neurological:     Mental Status: She is alert.     Lab Results  Component Value Date   WBC 6.0 08/14/2024   HGB 10.7 (L) 08/14/2024   HCT 32.8 (L) 08/14/2024   PLT 256 08/14/2024   GLUCOSE 92 08/14/2024   CHOL 130 05/12/2024   TRIG 98 05/12/2024   HDL 50 05/12/2024   LDLCALC 62 05/12/2024   ALT 13 08/14/2024   AST 22 08/14/2024   NA 141 08/14/2024   K 4.1 08/14/2024   CL 104 08/14/2024   CREATININE 0.92 08/14/2024   BUN 15 08/14/2024   CO2 24 08/14/2024   TSH 0.785 05/12/2024   HGBA1C 6.3 (H) 05/12/2024   MICROALBUR 4.7 01/27/2021     Assessment & Plan:  Dizziness Assessment & Plan: Nonspecific.  Atrovent and Xyzal for upper respiratory symptoms.  Referring to physical therapy for vestibular rehab.  Orders: -     Ambulatory referral to Physical Therapy  Other orders -     Levocetirizine Dihydrochloride; Take 1 tablet (5 mg total) by mouth every evening.  Dispense: 30 tablet; Refill: 1 -     Ipratropium Bromide; Place 2 sprays into both nostrils 4 (four) times daily as needed for rhinitis.  Dispense: 15 mL; Refill: 0    Follow-up:  Follow up with PCP  Jacqulyn Ahle DO [facility]

## 2024-10-13 ENCOUNTER — Ambulatory Visit (INDEPENDENT_AMBULATORY_CARE_PROVIDER_SITE_OTHER): Admitting: Family Medicine

## 2024-10-13 ENCOUNTER — Encounter: Payer: Self-pay | Admitting: Family Medicine

## 2024-10-13 VITALS — BP 134/74 | HR 92 | Ht <= 58 in | Wt 166.0 lb

## 2024-10-13 DIAGNOSIS — I10 Essential (primary) hypertension: Secondary | ICD-10-CM | POA: Diagnosis not present

## 2024-10-13 DIAGNOSIS — M25471 Effusion, right ankle: Secondary | ICD-10-CM | POA: Diagnosis not present

## 2024-10-13 DIAGNOSIS — R252 Cramp and spasm: Secondary | ICD-10-CM | POA: Insufficient documentation

## 2024-10-13 DIAGNOSIS — Z23 Encounter for immunization: Secondary | ICD-10-CM | POA: Diagnosis not present

## 2024-10-13 DIAGNOSIS — M25472 Effusion, left ankle: Secondary | ICD-10-CM

## 2024-10-13 DIAGNOSIS — Z7984 Long term (current) use of oral hypoglycemic drugs: Secondary | ICD-10-CM | POA: Diagnosis not present

## 2024-10-13 DIAGNOSIS — R7301 Impaired fasting glucose: Secondary | ICD-10-CM

## 2024-10-13 DIAGNOSIS — K219 Gastro-esophageal reflux disease without esophagitis: Secondary | ICD-10-CM

## 2024-10-13 DIAGNOSIS — E782 Mixed hyperlipidemia: Secondary | ICD-10-CM | POA: Diagnosis not present

## 2024-10-13 DIAGNOSIS — E038 Other specified hypothyroidism: Secondary | ICD-10-CM | POA: Diagnosis not present

## 2024-10-13 DIAGNOSIS — E559 Vitamin D deficiency, unspecified: Secondary | ICD-10-CM

## 2024-10-13 DIAGNOSIS — E119 Type 2 diabetes mellitus without complications: Secondary | ICD-10-CM | POA: Diagnosis not present

## 2024-10-13 DIAGNOSIS — E118 Type 2 diabetes mellitus with unspecified complications: Secondary | ICD-10-CM

## 2024-10-13 DIAGNOSIS — E785 Hyperlipidemia, unspecified: Secondary | ICD-10-CM

## 2024-10-13 DIAGNOSIS — E876 Hypokalemia: Secondary | ICD-10-CM

## 2024-10-13 MED ORDER — FUROSEMIDE 20 MG PO TABS: 20.0000 mg | ORAL_TABLET | Freq: Every day | ORAL | 0 refills | Status: DC

## 2024-10-13 MED ORDER — POTASSIUM CHLORIDE CRYS ER 10 MEQ PO TBCR: 10.0000 meq | EXTENDED_RELEASE_TABLET | Freq: Two times a day (BID) | ORAL | 1 refills | Status: AC

## 2024-10-13 MED ORDER — EMPAGLIFLOZIN 25 MG PO TABS: 25.0000 mg | ORAL_TABLET | Freq: Every day | ORAL | 3 refills | Status: AC

## 2024-10-13 NOTE — Assessment & Plan Note (Signed)
 The patient was encouraged to continue taking Rosuvastatin 5 mg daily for cholesterol management. Lifestyle modifications were also discussed, including avoiding simple carbohydrates such as cakes, sweet desserts, ice cream, soda (diet or regular), sweet tea, candies, chips, cookies, store-bought juices, excessive alcohol (more than 1-2 drinks per day), lemonade, artificial sweeteners, donuts, coffee creamers, and sugar-free products. Additionally, the patient was advised to reduce the consumption of greasy, fatty foods and increase physical activity to support cardiovascular health. The patient verbalized understanding and is aware of the plan of care.

## 2024-10-13 NOTE — Patient Instructions (Addendum)
 I appreciate the opportunity to provide care to you today!    Follow up:  4 months  Labs: please stop by the lab today to get your blood drawn (CBC, CMP, TSH, Lipid profile, HgA1c, Vit D)  Schedule medicare Annual wellness   For a Healthier YOU, I Recommend: Reducing your intake of sugar, sodium, carbohydrates, and saturated fats. Increasing your fiber intake by incorporating more whole grains, fruits, and vegetables into your meals. Setting healthy goals with a focus on lowering your consumption of carbs, sugar, and unhealthy fats. Adding variety to your diet by including a wide range of fruits and vegetables. Cutting back on soda and limiting processed foods as much as possible. Staying active: In addition to taking your weight loss medication, aim for at least 150 minutes of moderate-intensity physical activity each week for optimal results.   Left leg cramps We will review your lab results today to assess for any electrolyte imbalances or other causes. -Please continue taking your potassium supplement while you are on Lasix , as Lasix  can lower potassium levels in the body. -I recommend increasing your water intake, stretching the affected leg regularly, and avoiding prolonged sitting or standing. If symptoms worsen or do not improve, please let us  know.   Please follow up if your symptoms worsen or fail to improve.    Please continue to a heart-healthy diet and increase your physical activities. Try to exercise for at least five days a week.    It was a pleasure to see you and I look forward to continuing to work together on your health and well-being. Please do not hesitate to call the office if you need care or have questions about your care.  In case of emergency, please visit the Emergency Department for urgent care, or contact our clinic at [phone] to schedule an appointment. We're here to help you!   Have a wonderful day and week. With Gratitude, Meade JENEANE Gerlach MSN, FNP-BC, PMHNP-BC

## 2024-10-13 NOTE — Assessment & Plan Note (Signed)
 GERD diet encouraged

## 2024-10-13 NOTE — Assessment & Plan Note (Signed)
-  Will review lab results today to assess for any electrolyte imbalances or other causes. -Encouraged to continue taking potassium supplement while on Lasix , as Lasix  can lower potassium levels in the body. -I recommend increasing her water intake, stretching the affected leg regularly, and avoiding prolonged sitting or standing.  -Encouraged  to follow up If symptoms worsen or do not improve

## 2024-10-13 NOTE — Assessment & Plan Note (Signed)
 Patient educated on CDC recommendation for the vaccine. Verbal consent was obtained from the patient, vaccine administered by nurse, no sign of adverse reactions noted at this time. Patient education on arm soreness and use of tylenol or ibuprofen for this patient  was discussed. Patient educated on the signs and symptoms of adverse effect and advise to contact the office if they occur.

## 2024-10-13 NOTE — Assessment & Plan Note (Signed)
 Controlled BP She currently takes amlodipine  5 mg daily. A low-sodium diet and increased physical activity are encouraged. The patient verbalized understanding and is aware of the plan of care  BP Readings from Last 3 Encounters:  10/13/24 134/74  10/09/24 (!) 146/70  10/01/24 134/70

## 2024-10-13 NOTE — Assessment & Plan Note (Signed)
 She takes Jardiance 25 mg daily and glimepiride 4 mg daily. No reports of polyuria, polyphagia, or dyspnea. Encouraged to decrease intake of high-sugar foods and beverages and to increase physical activity.

## 2024-10-13 NOTE — Progress Notes (Signed)
 Established Patient Office Visit  Subjective:  Patient ID: Kimberly Best, female    DOB: 11/05/1933  Age: 89 y.o. MRN: [id number]  CC:  Chief Complaint  Patient presents with   Medical Management of Chronic Issues    Five month follow up    HPI Kimberly Best is a 89 y.o. female with past medical history of essential hypertension, type II diabetes, GERD, hyperlipidemia, or incontinence presents for f/u of  chronic medical conditions.  For the details of today's visit, please refer to the assessment and plan.    Past Medical History:  Diagnosis Date   Allergy    Anxiety    Arthritis    Cancer (HCC)    right breast cancer   Diabetes mellitus without complication (HCC)    GERD (gastroesophageal reflux disease)    H. pylori infection 05/2020   S/p treatment with Pylera .  H. pylori stool antigen negative on 12/21/2020.   HLD (hyperlipidemia)    Hypertension    IDA (iron deficiency anemia)    Hg in 9-10 range since September 2019.   Personal history of radiation therapy    Pessary maintenance 03/10/2015    Past Surgical History:  Procedure Laterality Date   BIOPSY  05/26/2020   Procedure: BIOPSY;  Surgeon: Shaaron Lamar HERO, MD;  Location: [facility];  Service: Endoscopy;;   BREAST BIOPSY Left 2013   fribrocystic change with calcs   BREAST LUMPECTOMY Right    CHOLECYSTECTOMY     COLONOSCOPY N/A 05/26/2020   Procedure: COLONOSCOPY;  Surgeon: Shaaron Lamar HERO, MD; Diverticulosis in sigmoid colon, friable colonic mucosa likely secondary to ASA/NSAID insult, exam was otherwise normal.     ESOPHAGOGASTRODUODENOSCOPY N/A 05/26/2020   Procedure: ESOPHAGOGASTRODUODENOSCOPY (EGD);  Surgeon: Shaaron Lamar HERO, MD;  Normal esophagus s/p dilation, mucosal changes in the stomach biopsied, normal examined duodenum.  Pathology revealed H. pylori.   EYE SURGERY     GIVENS CAPSULE STUDY N/A 02/16/2021   Procedure: GIVENS CAPSULE STUDY;  Surgeon: Shaaron Lamar HERO, MD;  gastric and duodenal  bulbar erosions, 2 nonbleeding small bowel ulcers with benign appearance suspected to be secondary to chronic aspirin use.    Family History  Problem Relation Age of Onset   Cancer Father    Stroke Mother    Hypertension Mother    Diabetes Sister    Hypertension Sister    Cancer Brother    Hypertension Brother    Bipolar disorder Daughter    Hypertension Maternal Grandmother    Tuberculosis Paternal Grandmother    Hypertension Paternal Grandmother    Alcohol abuse Paternal Grandfather    Hypertension Paternal Grandfather    Colon cancer Neg Hx     Social History   Socioeconomic History   Marital status: Widowed    Spouse name: Not on file   Number of children: Not on file   Years of education: Not on file   Highest education level: Not on file  Occupational History   Not on file  Tobacco Use   Smoking status: Never   Smokeless tobacco: Never  Vaping Use   Vaping status: Never Used  Substance and Sexual Activity   Alcohol use: Not Currently    Comment: Couple ounces of champagne once every couple of weeks or less.   Drug use: No   Sexual activity: Not Currently    Birth control/protection: Post-menopausal, Abstinence  Other Topics Concern   Not on file  Social History Narrative   Widow since 72,married  for 56 years.Lives alone.Retired.   Social Drivers of Health   Financial Resource Strain: Medium Risk (09/26/2023)   Overall Financial Resource Strain (CARDIA)    Difficulty of Paying Living Expenses: Somewhat hard  Food Insecurity: No Food Insecurity (09/26/2023)   Hunger Vital Sign    Worried About Running Out of Food in the Last Year: Never true    Ran Out of Food in the Last Year: Never true  Transportation Needs: No Transportation Needs (09/26/2023)   PRAPARE - Administrator, Civil Service (Medical): No    Lack of Transportation (Non-Medical): No  Physical Activity: Insufficiently Active (10/01/2024)   Exercise Vital Sign    Days of  Exercise per Week: 3 days    Minutes of Exercise per Session: 20 min  Stress: No Stress Concern Present (10/01/2024)   [employer] of Occupational Health - Occupational Stress Questionnaire    Feeling of Stress: Not at all  Social Connections: Unknown (09/26/2023)   Social Connection and Isolation Panel    Frequency of Communication with Friends and Family: More than three times a week    Frequency of Social Gatherings with Friends and Family: More than three times a week    Attends Religious Services: More than 4 times per year    Active Member of [employer] or Organizations: No    Attends Banker Meetings: Never    Marital Status: Patient declined  Catering Manager Violence: Not At Risk (10/01/2024)   Humiliation, Afraid, Rape, and Kick questionnaire    Fear of Current or Ex-Partner: No    Emotionally Abused: No    Physically Abused: No    Sexually Abused: No    Outpatient Medications Prior to Visit  Medication Sig Dispense Refill   ACCU-CHEK AVIVA PLUS test strip USE AS DIRECTED TO TEST TWICE DAILY. 100 strip 0   Accu-Chek Softclix Lancets lancets USE AS DIRECTED TO TEST TWICE DAILY. 100 each 0   acetaminophen  (TYLENOL ) 650 MG CR tablet Take 650 mg by mouth 2 (two) times daily as needed for pain.     amLODipine  (NORVASC ) 5 MG tablet TAKE ONE TABLET (5MG  TOTAL) BY MOUTH DAILY 30 tablet 1   celecoxib  (CELEBREX ) 100 MG capsule Take 1 capsule (100 mg total) by mouth 2 (two) times daily. 60 capsule 0   cholecalciferol (VITAMIN D ) 1000 UNITS tablet Take 1,000 Units by mouth daily.     clotrimazole -betamethasone  (LOTRISONE ) cream For up to 14 days and then stop the medication. 45 g 0   glimepiride  (AMARYL ) 4 MG tablet TAKE ONE TABLET (4MG  TOTAL) BY MOUTH DAILY WITH BREAKFAST 60 tablet 3   glucose blood (ONETOUCH ULTRA) test strip USE DAILY AS DIRECTED 100 each 10   ipratropium (ATROVENT) 0.06 % nasal spray Place 2 sprays into both nostrils 4 (four) times daily as needed for  rhinitis. 15 mL 0   levocetirizine (XYZAL) 5 MG tablet Take 1 tablet (5 mg total) by mouth every evening. 30 tablet 1   meclizine  (ANTIVERT ) 25 MG tablet TAKE ONE TABLET (25MG  TOTAL) BY MOUTH THREE TIMES DAILY AS NEEDED FOR DIZZINESS 30 tablet 2   montelukast  (SINGULAIR ) 10 MG tablet TAKE ONE TABLET (10MG  TOTAL) BY MOUTH ATBEDTIME 30 tablet 2   Multiple Vitamins-Minerals (IMMUNE SUPPORT PO) Take 1 tablet by mouth daily.     pantoprazole  (PROTONIX ) 40 MG tablet TAKE ONE TABLET (40MG ) BY MOUTH DAILY 90 tablet 0   potassium chloride  (KLOR-CON ) 10 MEQ tablet TAKE ONE TABLET ( TOTAL) BY  MOUTH TWO TIMES DAILY 90 tablet 0   rosuvastatin  (CRESTOR ) 5 MG tablet TAKE ONE (1) TABLET BY MOUTH EVERY DAY 90 tablet 1   SLOW FE 142 (45 Fe) MG TBCR Take 2 tablets by mouth every morning. 180 tablet 1   furosemide  (LASIX ) 20 MG tablet Take 1 tablet (20 mg total) by mouth daily. 14 tablet 0   JARDIANCE  25 MG TABS tablet TAKE ONE TABLET (25MG  TOTAL) BY MOUTH DAILY BEFORE BREAKFAST 30 tablet 3   potassium chloride  (KLOR-CON  M) 10 MEQ tablet Take 1 tablet (10 mEq total) by mouth 2 (two) times daily. 90 tablet 1   No facility-administered medications prior to visit.    Allergies  Allergen Reactions   Lisinopril  Other (See Comments)    Tongue swelling   Penicillins Swelling    Has patient had a PCN reaction causing immediate rash, facial/tongue/throat swelling, SOB or lightheadedness with hypotension: Yes Has patient had a PCN reaction causing severe rash involving mucus membranes or skin necrosis: No Has patient had a PCN reaction that required hospitalization: No Has patient had a PCN reaction occurring within the last 10 years: No If all of the above answers are NO, then may proceed with Cephalosporin use.    ROS Review of Systems  Constitutional:  Negative for chills and fever.  Eyes:  Negative for visual disturbance.  Respiratory:  Negative for chest tightness and shortness of breath.    Musculoskeletal:        Left leg cramps  Neurological:  Negative for dizziness and headaches.      Objective:    Physical Exam HENT:     Head: Normocephalic.     Mouth/Throat:     Mouth: Mucous membranes are moist.  Cardiovascular:     Rate and Rhythm: Normal rate.     Heart sounds: Normal heart sounds.  Pulmonary:     Effort: Pulmonary effort is normal.     Breath sounds: Normal breath sounds.  Neurological:     Mental Status: She is alert.     BP 134/74   Pulse 92   Ht 4' 8 (1.422 m)   Wt 166 lb (75.3 kg)   SpO2 98%   BMI 37.22 kg/m  Wt Readings from Last 3 Encounters:  10/13/24 166 lb (75.3 kg)  10/09/24 171 lb (77.6 kg)  10/01/24 170 lb (77.1 kg)    Lab Results  Component Value Date   TSH 0.785 05/12/2024   Lab Results  Component Value Date   WBC 6.0 08/14/2024   HGB 10.7 (L) 08/14/2024   HCT 32.8 (L) 08/14/2024   MCV 92 08/14/2024   PLT 256 08/14/2024   Lab Results  Component Value Date   NA 141 08/14/2024   K 4.1 08/14/2024   CO2 24 08/14/2024   GLUCOSE 92 08/14/2024   BUN 15 08/14/2024   CREATININE 0.92 08/14/2024   BILITOT 0.4 08/14/2024   ALKPHOS 66 08/14/2024   AST 22 08/14/2024   ALT 13 08/14/2024   PROT 7.5 08/14/2024   ALBUMIN 4.4 08/14/2024   CALCIUM  9.2 08/14/2024   ANIONGAP 9 07/18/2021   EGFR 60 08/14/2024   Lab Results  Component Value Date   CHOL 130 05/12/2024   Lab Results  Component Value Date   HDL 50 05/12/2024   Lab Results  Component Value Date   LDLCALC 62 05/12/2024   Lab Results  Component Value Date   TRIG 98 05/12/2024   Lab Results  Component Value Date  CHOLHDL 2.6 05/12/2024   Lab Results  Component Value Date   HGBA1C 6.3 (H) 05/12/2024      Assessment & Plan:  Controlled diabetes mellitus type 2 with complications (HCC) Assessment & Plan: She takes Jardiance  25 mg daily and glimepiride  4 mg daily. No reports of polyuria, polyphagia, or dyspnea. Encouraged to decrease intake of  high-sugar foods and beverages and to increase physical activity.    Orders: -     Empagliflozin ; Take 1 tablet (25 mg total) by mouth daily.  Dispense: 30 tablet; Refill: 3  Hyperlipidemia LDL goal <100 Assessment & Plan: The patient was encouraged to continue taking Rosuvastatin  5 mg daily for cholesterol management. Lifestyle modifications were also discussed, including avoiding simple carbohydrates such as cakes, sweet desserts, ice cream, soda (diet or regular), sweet tea, candies, chips, cookies, store-bought juices, excessive alcohol (more than 1-2 drinks per day), lemonade, artificial sweeteners, donuts, coffee creamers, and sugar-free products. Additionally, the patient was advised to reduce the consumption of greasy, fatty foods and increase physical activity to support cardiovascular health. The patient verbalized understanding and is aware of the plan of care.    Gastroesophageal reflux disease without esophagitis Assessment & Plan: GERD diet encouraged   Essential hypertension Assessment & Plan: Controlled BP She currently takes amlodipine  5 mg daily. A low-sodium diet and increased physical activity are encouraged. The patient verbalized understanding and is aware of the plan of care  BP Readings from Last 3 Encounters:  10/13/24 134/74  10/09/24 (!) 146/70  10/01/24 134/70      Cramp in lower leg Assessment & Plan: -Will review lab results today to assess for any electrolyte imbalances or other causes. -Encouraged to continue taking potassium supplement while on Lasix , as Lasix  can lower potassium levels in the body. -I recommend increasing her water intake, stretching the affected leg regularly, and avoiding prolonged sitting or standing.  -Encouraged  to follow up If symptoms worsen or do not improve   Encounter for immunization Assessment & Plan: Patient educated on CDC recommendation for the vaccine. Verbal consent was obtained from the patient, vaccine  administered by nurse, no sign of adverse reactions noted at this time. Patient education on arm soreness and use of tylenol  or ibuprofen for this patient  was discussed. Patient educated on the signs and symptoms of adverse effect and advise to contact the office if they occur.   Orders: -     Flu vaccine HIGH DOSE PF(Fluzone Trivalent)  Swelling of both ankles -     Furosemide ; Take 1 tablet (20 mg total) by mouth daily.  Dispense: 14 tablet; Refill: 0  Hypokalemia -     Potassium Chloride  Crys ER; Take 1 tablet (10 mEq total) by mouth 2 (two) times daily.  Dispense: 90 tablet; Refill: 1  IFG (impaired fasting glucose) -     Hemoglobin A1c  Mixed hyperlipidemia -     Lipid panel -     CMP14+EGFR -     CBC with Differential/Platelet  Vitamin D  deficiency -     VITAMIN D  25 Hydroxy (Vit-D Deficiency, Fractures)  TSH (thyroid -stimulating hormone deficiency) -     TSH + free T4  Note: This chart has been completed using Engineer, Civil (consulting) software, and while attempts have been made to ensure accuracy, certain words and phrases may not be transcribed as intended.    Follow-up: Return in about 4 months (around 02/10/2025).   Zaidin Blyden  Z Bacchus, FNP

## 2024-10-14 LAB — CBC WITH DIFFERENTIAL/PLATELET
Basophils Absolute: 0 x10E3/uL (ref 0.0–0.2)
Basos: 0 %
EOS (ABSOLUTE): 0 x10E3/uL (ref 0.0–0.4)
Eos: 1 %
Hematocrit: 34.4 % (ref 34.0–46.6)
Hemoglobin: 10.8 g/dL — ABNORMAL LOW (ref 11.1–15.9)
Immature Grans (Abs): 0 x10E3/uL (ref 0.0–0.1)
Immature Granulocytes: 0 %
Lymphocytes Absolute: 2.6 x10E3/uL (ref 0.7–3.1)
Lymphs: 44 %
MCH: 30.3 pg (ref 26.6–33.0)
MCHC: 31.4 g/dL — ABNORMAL LOW (ref 31.5–35.7)
MCV: 96 fL (ref 79–97)
Monocytes Absolute: 0.6 x10E3/uL (ref 0.1–0.9)
Monocytes: 10 %
Neutrophils Absolute: 2.6 x10E3/uL (ref 1.4–7.0)
Neutrophils: 45 %
Platelets: 269 x10E3/uL (ref 150–450)
RBC: 3.57 x10E6/uL — ABNORMAL LOW (ref 3.77–5.28)
RDW: 14.4 % (ref 11.7–15.4)
WBC: 5.8 x10E3/uL (ref 3.4–10.8)

## 2024-10-14 LAB — CMP14+EGFR
ALT: 7 IU/L (ref 0–32)
AST: 19 IU/L (ref 0–40)
Albumin: 4.3 g/dL (ref 3.7–4.7)
Alkaline Phosphatase: 81 IU/L (ref 48–129)
BUN/Creatinine Ratio: 20 (ref 12–28)
BUN: 16 mg/dL (ref 8–27)
Bilirubin Total: 0.6 mg/dL (ref 0.0–1.2)
CO2: 23 mmol/L (ref 20–29)
Calcium: 9.5 mg/dL (ref 8.7–10.3)
Chloride: 104 mmol/L (ref 96–106)
Creatinine, Ser: 0.81 mg/dL (ref 0.57–1.00)
Globulin, Total: 3.3 g/dL (ref 1.5–4.5)
Glucose: 88 mg/dL (ref 70–99)
Potassium: 4.6 mmol/L (ref 3.5–5.2)
Sodium: 140 mmol/L (ref 134–144)
Total Protein: 7.6 g/dL (ref 6.0–8.5)
eGFR: 69 mL/min/1.73 (ref >59)

## 2024-10-14 LAB — LIPID PANEL
Chol/HDL Ratio: 2.8 ratio (ref 0.0–4.4)
Cholesterol, Total: 162 mg/dL (ref 100–199)
HDL: 57 mg/dL (ref >39)
LDL Chol Calc (NIH): 89 mg/dL (ref 0–99)
Triglycerides: 86 mg/dL (ref 0–149)
VLDL Cholesterol Cal: 16 mg/dL (ref 5–40)

## 2024-10-14 LAB — HEMOGLOBIN A1C
Est. average glucose Bld gHb Est-mCnc: 137 mg/dL
Hgb A1c MFr Bld: 6.4 % — ABNORMAL HIGH (ref 4.8–5.6)

## 2024-10-14 LAB — TSH+FREE T4
Free T4: 1.17 ng/dL (ref 0.82–1.77)
TSH: 1.27 u[IU]/mL (ref 0.450–4.500)

## 2024-10-14 LAB — VITAMIN D 25 HYDROXY (VIT D DEFICIENCY, FRACTURES): Vit D, 25-Hydroxy: 37.4 ng/mL (ref 30.0–100.0)

## 2024-10-23 ENCOUNTER — Other Ambulatory Visit (HOSPITAL_COMMUNITY): Payer: Self-pay | Admitting: Family Medicine

## 2024-10-23 DIAGNOSIS — Z1231 Encounter for screening mammogram for malignant neoplasm of breast: Secondary | ICD-10-CM

## 2024-10-30 ENCOUNTER — Other Ambulatory Visit: Payer: Self-pay | Admitting: Family Medicine

## 2024-10-30 DIAGNOSIS — M25471 Effusion, right ankle: Secondary | ICD-10-CM

## 2024-11-02 ENCOUNTER — Ambulatory Visit: Payer: Self-pay | Admitting: Family Medicine

## 2024-11-03 ENCOUNTER — Other Ambulatory Visit: Payer: Self-pay

## 2024-11-03 DIAGNOSIS — I10 Essential (primary) hypertension: Secondary | ICD-10-CM

## 2024-11-03 MED ORDER — AMLODIPINE BESYLATE 5 MG PO TABS: 5.0000 mg | ORAL_TABLET | Freq: Every day | ORAL | 1 refills | Status: DC

## 2024-11-04 ENCOUNTER — Other Ambulatory Visit: Payer: Self-pay | Admitting: Family Medicine

## 2024-11-04 DIAGNOSIS — I10 Essential (primary) hypertension: Secondary | ICD-10-CM

## 2024-11-04 NOTE — Telephone Encounter (Signed)
 Copied from CRM [id number]. Topic: Clinical - Medication Refill >> Nov 04, 2024  2:38 PM Tysheama G wrote: Medication: amLODipine  (NORVASC ) 5 MG tablet  Has the patient contacted their pharmacy? Yes (Agent: If no, request that the patient contact the pharmacy for the refill. If patient does not wish to contact the pharmacy document the reason why and proceed with request.) (Agent: If yes, when and what did the pharmacy advise?)  This is the patient's preferred pharmacy:  [facility] - [place],  - [address] [place] [address] Phone: [phone] Fax: [phone]    Is this the correct pharmacy for this prescription? Yes If no, delete pharmacy and type the correct one.   Has the prescription been filled recently? No  Is the patient out of the medication? Yes  Has the patient been seen for an appointment in the last year OR does the patient have an upcoming appointment? Yes  Can we respond through MyChart? No  Agent: Please be advised that Rx refills may take up to 3 business days. We ask that you follow-up with your pharmacy.

## 2024-11-04 NOTE — Telephone Encounter (Signed)
 Noted order on 11/03/24 with one refill.

## 2024-11-12 ENCOUNTER — Encounter (HOSPITAL_COMMUNITY): Payer: Self-pay

## 2024-11-12 ENCOUNTER — Other Ambulatory Visit: Payer: Self-pay

## 2024-11-12 ENCOUNTER — Ambulatory Visit (HOSPITAL_COMMUNITY): Attending: Family Medicine

## 2024-11-12 DIAGNOSIS — R42 Dizziness and giddiness: Secondary | ICD-10-CM | POA: Insufficient documentation

## 2024-11-12 DIAGNOSIS — Z7409 Other reduced mobility: Secondary | ICD-10-CM | POA: Diagnosis present

## 2024-11-12 NOTE — Therapy (Signed)
 OUTPATIENT PHYSICAL THERAPY VESTIBULAR EVALUATION     Patient Name: Kimberly Best MRN: [id number] DOB:[birth year], 89 y.o., female Today's Date: 11/12/2024  END OF SESSION:  PT End of Session - 11/12/24 1503     Authorization Type [employer] MEDICARE HMO/PPO    Authorization Time Period no auth    Progress Note Due on Visit 8    PT Start Time 1417    PT Stop Time 1500    PT Time Calculation (min) 43 min    Activity Tolerance Patient tolerated treatment well;Other (comment)   dizziness   Behavior During Therapy WFL for tasks assessed/performed          Past Medical History:  Diagnosis Date   Allergy    Anxiety    Arthritis    Cancer (HCC)    right breast cancer   Diabetes mellitus without complication (HCC)    GERD (gastroesophageal reflux disease)    H. pylori infection 05/2020   S/p treatment with Pylera .  H. pylori stool antigen negative on 12/21/2020.   HLD (hyperlipidemia)    Hypertension    IDA (iron deficiency anemia)    Hg in 9-10 range since September 2019.   Personal history of radiation therapy    Pessary maintenance 03/10/2015   Past Surgical History:  Procedure Laterality Date   BIOPSY  05/26/2020   Procedure: BIOPSY;  Surgeon: Shaaron Lamar HERO, MD;  [facility];  Service: Endoscopy;;   BREAST BIOPSY Left 2013   fribrocystic change with calcs   BREAST LUMPECTOMY Right    CHOLECYSTECTOMY     COLONOSCOPY N/A 05/26/2020   Procedure: COLONOSCOPY;  Surgeon: Shaaron Lamar HERO, MD; Diverticulosis in sigmoid colon, friable colonic mucosa likely secondary to ASA/NSAID insult, exam was otherwise normal.     ESOPHAGOGASTRODUODENOSCOPY N/A 05/26/2020   Procedure: ESOPHAGOGASTRODUODENOSCOPY (EGD);  Surgeon: Shaaron Lamar HERO, MD;  Normal esophagus s/p dilation, mucosal changes in the stomach biopsied, normal examined duodenum.  Pathology revealed H. pylori.   EYE SURGERY     GIVENS CAPSULE STUDY N/A 02/16/2021   Procedure: GIVENS CAPSULE STUDY;  Surgeon:  Shaaron Lamar HERO, MD;  gastric and duodenal bulbar erosions, 2 nonbleeding small bowel ulcers with benign appearance suspected to be secondary to chronic aspirin use.   Patient Active Problem List   Diagnosis Date Noted   Cramp in lower leg 10/13/2024   Encounter for immunization 10/13/2024   Dizziness 10/12/2024   Fx sacrum/coccyx-closed (HCC) 09/24/2024   Osteoarthritis of left hip 05/12/2024   Stress due to family tension 07/24/2023   Tension headache 07/24/2023   Acute adjustment disorder with anxiety 06/26/2023   Encounter for annual general medical examination with abnormal findings in adult 12/18/2022   Essential hypertension 04/13/2022   Seasonal allergies 04/13/2022   Environmental allergies 01/30/2022   Morbid obesity (HCC) 12/06/2021   Gastric erosion    H. pylori infection 07/14/2020   NSAID long-term use 07/14/2020   Anemia 04/14/2020   Pessary maintenance 04/14/2020   Gastroesophageal reflux disease 01/14/2020   Osteoarthritis of right shoulder 01/14/2020   Osteoporosis 09/16/2019   Controlled diabetes mellitus type 2 with complications (HCC) 09/16/2019   Hyperlipidemia LDL goal <100 09/16/2019   Problem with vaginal pessary 09/03/2018   Fitting and adjustment of pessary 03/10/2015   Uterovaginal prolapse, complete 02/18/2015   Complete uterovaginal prolapse 02/01/2015   Female stress incontinence 06/10/2014   Urge incontinence 06/10/2014    PCP: Edman Meade PEDLAR, FNP  REFERRING PROVIDER: Cook, Jayce G, DO  REFERRING  DIAG: R42 (ICD-10-CM) - Dizziness  THERAPY DIAG:  Dizziness and giddiness  Impaired functional mobility, balance, gait, and endurance  ONSET DATE: 12-13 years  Rationale for Evaluation and Treatment: Rehabilitation  SUBJECTIVE:   SUBJECTIVE STATEMENT: Pt states she has been dealing with the vertigo for over 12 years. Pt states she fell in [place]  in recent weeks, fractured tail bone. Still is not feeling well, has to sit on padding. Pt  states they told her fx would heal with time. Pt states she takes meclizine  once per day at night. Pt states she gets dizzy about every time she lays down. Pt states she is also is having trouble with her eyes, eye appointment scheduled for January.  Pt accompanied by: self  PERTINENT HISTORY:  Breast cancer Fall history Hx of vertigo  PAIN:  Are you having pain? Yes: NPRS scale: 8/10 Pain location: tail bone Pain description: dull pain Aggravating factors: sitting on it Relieving factors: tylenol , sitting on something  PRECAUTIONS: Fall  RED FLAGS: None   WEIGHT BEARING RESTRICTIONS: No  FALLS: Has patient fallen in last 6 months? Yes. Number of falls Pt states she fell out of cart, fracture tail bone  LIVING ENVIRONMENT: Lives with: lives alone and pt has an aide 3 days a week Lives in: House/apartment Stairs: Yes: Internal: 7-8 steps; on right going up Has following equipment at home: Ramped entry  PLOF: Independent with basic ADLs, Independent with household mobility with device, Independent with community mobility with device, and Independent with transfers  PATIENT GOALS: decreased the dizziness, increase balance, get legs stronger  OBJECTIVE:  Note: Objective measures were completed at Evaluation unless otherwise noted.  DIAGNOSTIC FINDINGS: None applicable  COGNITION: Overall cognitive status: Within functional limits for tasks assessed   SENSATION: Light touch: Impaired  numbness and tingling in hands and feet due to diabetes   Cervical ROM:    Active A/PROM (deg) eval  Flexion WFL  Extension WFL  Right lateral flexion WFL  Left lateral flexion WFL  Right rotation WFL, little dizziness, slight pull in neck  Left rotation WFL  (Blank rows = not tested)  LOWER EXTREMITY MMT: TBA  MMT Right eval Left eval  Hip flexion    Hip abduction    Hip adduction    Hip internal rotation    Hip external rotation    Knee flexion    Knee extension     Ankle dorsiflexion    Ankle plantarflexion    Ankle inversion    Ankle eversion    (Blank rows = not tested)  VESTIBULAR ASSESSMENT:   OCULOMOTOR EXAM:  Ocular Alignment: normal  Ocular ROM: No Limitations  Spontaneous Nystagmus: absent  Gaze-Induced Nystagmus: absent  Smooth Pursuits: intact  Saccades: hypometric/undershoots, vertical  Convergence/Divergence: less than 6 cm     VESTIBULAR - OCULAR REFLEX:   Slow VOR: Positive Right, little dizziness, slight pull in neck  VOR Cancellation: Normal, slight pull in neck     POSITIONAL TESTING: Right Dix-Hallpike: could not be assessed as pt was too nervous and had eyes closed for dizziness spell Left Dix-Hallpike: not tested    FUNCTIONAL GAIT: 5 times sit to stand: TBA Timed up and go (TUG): TBA  TREATMENT DATE:  11/12/2024   Evaluation: -ROM measured, Strength assessed, HEP prescribed, pt educated on prognosis, findings, and importance of HEP compliance if given. -Positive R Intel, nystagmus present, pt unable to keep eyes open during initial dizzy spell, no nausea       Canalith Repositioning:  Epley Right: Number of Reps: 1  PATIENT EDUCATION: Education details: Pt was educated on findings of PT evaluation, prognosis, frequency of therapy visits and rationale, attendance policy, and HEP if given.   Person educated: Patient Education method: Explanation, Demonstration, and Verbal cues Education comprehension: verbalized understanding, verbal cues required, and needs further education  HOME EXERCISE PROGRAM: None provided at Evaluation  GOALS: Goals reviewed with patient? No  SHORT TERM GOALS: Target date: 12/03/24  Pt will be independent with home exercise program in order to improve balance and decrease dizziness symptoms in order to decrease fall risk and improve function  at home and work. Baseline: HEP to be given and first follow up appointment Goal status: INITIAL  2.  Pt will report no falls for the next 3 weeks for improved performance with community ambulation and increased safety.  Baseline: see eval subjective Goal status: INITIAL  LONG TERM GOALS: Target date: 12/25/23  Patient will report of a improvement of at least 50% in dizziness symptoms for improved quality of life and safety with community ambulation.  Baseline:  Goal status: INITIAL  2.  Patient will demonstrate reduced falls risk as evidenced by Dynamic Gait Index (DGI) >19/24 or FGA (at least a 22/30). Baseline: TBA Goal status: INITIAL  3.  Patient will demonstrate [facility] right cervical rotation with no pull in neck and no dizziness for improved performance driving.  Baseline: see objective Goal status: INITIAL  4.  Patient will demonstrate a negative result on R [facility] for decreased vertigo symptoms and improved quality of life.  Baseline: see objective Goal status: INITIAL     ASSESSMENT:  CLINICAL IMPRESSION: Patient is a 89 y.o. female who was seen today for physical therapy evaluation and treatment for R42 (ICD-10-CM) - Dizziness.   Patient demonstrates increased dizziness with right cervical rotation and R dix hall pike testing. Therapist not completely convinced that peripheral vertigo is only cause of dizziness at this time. Pt also demonstrates limited functional transfer and bed mobility. Pt demonstrates decreased LE strength, abnormal pain rating in tail bone, and impaired balance. Patient also demonstrates difficulty with ambulation during today's session with cane and decreased stride length and velocity noted. Patient also demonstrates nervousness of rolling maneuvers and hesitant although is eventually convinced. Patient requires education on role of PT, causes of dizziness, and overall POC. Patient would benefit from skilled physical therapy for decreased  dizziness symptoms, increased endurance with ambulation, increased LE strength, and balance for improved gait quality, return to higher level of function with ADLs, and progress towards therapy goals.   OBJECTIVE IMPAIRMENTS: Abnormal gait, decreased activity tolerance, decreased balance, decreased endurance, decreased knowledge of condition, decreased knowledge of use of DME, decreased mobility, difficulty walking, decreased ROM, decreased strength, dizziness, postural dysfunction, and pain.   ACTIVITY LIMITATIONS: carrying, lifting, bending, sitting, standing, squatting, stairs, transfers, and bed mobility  PARTICIPATION LIMITATIONS: meal prep, cleaning, laundry, driving, shopping, community activity, and yard work  PERSONAL FACTORS: Age, Past/current experiences, Time since onset of injury/illness/exacerbation, and 1 comorbidity: diabetes are also affecting patient's functional outcome.   REHAB POTENTIAL: Fair chronic in nature  CLINICAL DECISION MAKING: Stable/uncomplicated  EVALUATION COMPLEXITY: Low   PLAN:  PT FREQUENCY: 2x/week  PT DURATION: 6 weeks  PLANNED INTERVENTIONS: 97110-Therapeutic exercises, 97530- Therapeutic activity, 97112- Neuromuscular re-education, 97535- Self Care, 02859- Manual therapy, [phone]- Gait training, Patient/Family education, Balance training, Stair training, Joint mobilization, Joint manipulation, Spinal manipulation, Spinal mobilization, Vestibular training, DME instructions, Cryotherapy, and Moist heat  PLAN FOR NEXT SESSION: 5TSTS, TUG, assess balance and LE strength, official cervical spine rotation ROM, review goals and provide HEP if applicable   Lang Ada, PT, DPT [facility] Office: [phone] 6:02 PM, 11/12/24

## 2024-11-17 ENCOUNTER — Ambulatory Visit (HOSPITAL_COMMUNITY)

## 2024-11-17 NOTE — Therapy (Incomplete)
 OUTPATIENT PHYSICAL THERAPY VESTIBULAR TREATMENT      Patient Name: Kimberly Best MRN: [id number] DOB:1933/12/02, 89 y.o., female Today's Date: 11/17/2024  END OF SESSION:    Past Medical History:  Diagnosis Date   Allergy    Anxiety    Arthritis    Cancer (HCC)    right breast cancer   Diabetes mellitus without complication (HCC)    GERD (gastroesophageal reflux disease)    H. pylori infection 05/2020   S/p treatment with Pylera .  H. pylori stool antigen negative on 12/21/2020.   HLD (hyperlipidemia)    Hypertension    IDA (iron deficiency anemia)    Hg in 9-10 range since September 2019.   Personal history of radiation therapy    Pessary maintenance 03/10/2015   Past Surgical History:  Procedure Laterality Date   BIOPSY  05/26/2020   Procedure: BIOPSY;  Surgeon: Shaaron Lamar HERO, MD;  [facility];  Service: Endoscopy;;   BREAST BIOPSY Left 2013   fribrocystic change with calcs   BREAST LUMPECTOMY Right    CHOLECYSTECTOMY     COLONOSCOPY N/A 05/26/2020   Procedure: COLONOSCOPY;  Surgeon: Shaaron Lamar HERO, MD; Diverticulosis in sigmoid colon, friable colonic mucosa likely secondary to ASA/NSAID insult, exam was otherwise normal.     ESOPHAGOGASTRODUODENOSCOPY N/A 05/26/2020   Procedure: ESOPHAGOGASTRODUODENOSCOPY (EGD);  Surgeon: Shaaron Lamar HERO, MD;  Normal esophagus s/p dilation, mucosal changes in the stomach biopsied, normal examined duodenum.  Pathology revealed H. pylori.   EYE SURGERY     GIVENS CAPSULE STUDY N/A 02/16/2021   Procedure: GIVENS CAPSULE STUDY;  Surgeon: Shaaron Lamar HERO, MD;  gastric and duodenal bulbar erosions, 2 nonbleeding small bowel ulcers with benign appearance suspected to be secondary to chronic aspirin use.   Patient Active Problem List   Diagnosis Date Noted   Cramp in lower leg 10/13/2024   Encounter for immunization 10/13/2024   Dizziness 10/12/2024   Fx sacrum/coccyx-closed (HCC) 09/24/2024   Osteoarthritis of left hip  05/12/2024   Stress due to family tension 07/24/2023   Tension headache 07/24/2023   Acute adjustment disorder with anxiety 06/26/2023   Encounter for annual general medical examination with abnormal findings in adult 12/18/2022   Essential hypertension 04/13/2022   Seasonal allergies 04/13/2022   Environmental allergies 01/30/2022   Morbid obesity (HCC) 12/06/2021   Gastric erosion    H. pylori infection 07/14/2020   NSAID long-term use 07/14/2020   Anemia 04/14/2020   Pessary maintenance 04/14/2020   Gastroesophageal reflux disease 01/14/2020   Osteoarthritis of right shoulder 01/14/2020   Osteoporosis 09/16/2019   Controlled diabetes mellitus type 2 with complications (HCC) 09/16/2019   Hyperlipidemia LDL goal <100 09/16/2019   Problem with vaginal pessary 09/03/2018   Fitting and adjustment of pessary 03/10/2015   Uterovaginal prolapse, complete 02/18/2015   Complete uterovaginal prolapse 02/01/2015   Female stress incontinence 06/10/2014   Urge incontinence 06/10/2014    PCP: Edman Meade PEDLAR, FNP  REFERRING PROVIDER: Cook, Jayce G, DO  REFERRING DIAG: R42 (ICD-10-CM) - Dizziness  THERAPY DIAG:  No diagnosis found.  ONSET DATE: 12-13 years  Rationale for Evaluation and Treatment: Rehabilitation  SUBJECTIVE:   SUBJECTIVE STATEMENT: ***   Eval: Pt states she has been dealing with the vertigo for over 12 years. Pt states she fell in [place]  in recent weeks, fractured tail bone. Still is not feeling well, has to sit on padding. Pt states they told her fx would heal with time. Pt states she takes meclizine   once per day at night. Pt states she gets dizzy about every time she lays down. Pt states she is also is having trouble with her eyes, eye appointment scheduled for January.  Pt accompanied by: self  PERTINENT HISTORY:  Breast cancer Fall history Hx of vertigo  PAIN:  Are you having pain? Yes: NPRS scale: 8/10 Pain location: tail bone Pain description:  dull pain Aggravating factors: sitting on it Relieving factors: tylenol , sitting on something  PRECAUTIONS: Fall  RED FLAGS: None   WEIGHT BEARING RESTRICTIONS: No  FALLS: Has patient fallen in last 6 months? Yes. Number of falls Pt states she fell out of cart, fracture tail bone  LIVING ENVIRONMENT: Lives with: lives alone and pt has an aide 3 days a week Lives in: House/apartment Stairs: Yes: Internal: 7-8 steps; on right going up Has following equipment at home: Ramped entry  PLOF: Independent with basic ADLs, Independent with household mobility with device, Independent with community mobility with device, and Independent with transfers  PATIENT GOALS: decreased the dizziness, increase balance, get legs stronger  OBJECTIVE:  Note: Objective measures were completed at Evaluation unless otherwise noted.  DIAGNOSTIC FINDINGS: None applicable  COGNITION: Overall cognitive status: Within functional limits for tasks assessed   SENSATION: Light touch: Impaired  numbness and tingling in hands and feet due to diabetes   Cervical ROM:    Active A/PROM (deg) eval  Flexion WFL  Extension WFL  Right lateral flexion WFL  Left lateral flexion WFL  Right rotation WFL, little dizziness, slight pull in neck  Left rotation WFL  (Blank rows = not tested)  LOWER EXTREMITY MMT: TBA  MMT Right eval Left eval  Hip flexion    Hip abduction    Hip adduction    Hip internal rotation    Hip external rotation    Knee flexion    Knee extension    Ankle dorsiflexion    Ankle plantarflexion    Ankle inversion    Ankle eversion    (Blank rows = not tested)  VESTIBULAR ASSESSMENT:   OCULOMOTOR EXAM:  Ocular Alignment: normal  Ocular ROM: No Limitations  Spontaneous Nystagmus: absent  Gaze-Induced Nystagmus: absent  Smooth Pursuits: intact  Saccades: hypometric/undershoots, vertical  Convergence/Divergence: less than 6 cm     VESTIBULAR - OCULAR REFLEX:   Slow VOR:  Positive Right, little dizziness, slight pull in neck  VOR Cancellation: Normal, slight pull in neck     POSITIONAL TESTING: Right Dix-Hallpike: could not be assessed as pt was too nervous and had eyes closed for dizziness spell Left Dix-Hallpike: not tested    FUNCTIONAL GAIT: 5 times sit to stand: TBA Timed up and go (TUG): TBA                                                                                                                             TREATMENT DATE:  11/17/24 EXERCISE LOG  Exercise Repetitions and Resistance Comments                       Blank cell = exercise not performed today   11/12/2024   Evaluation: -ROM measured, Strength assessed, HEP prescribed, pt educated on prognosis, findings, and importance of HEP compliance if given. -Positive R Intel, nystagmus present, pt unable to keep eyes open during initial dizzy spell, no nausea       Canalith Repositioning:  Epley Right: Number of Reps: 1  PATIENT EDUCATION: Education details: Pt was educated on findings of PT evaluation, prognosis, frequency of therapy visits and rationale, attendance policy, and HEP if given.   Person educated: Patient Education method: Explanation, Demonstration, and Verbal cues Education comprehension: verbalized understanding, verbal cues required, and needs further education  HOME EXERCISE PROGRAM: None provided at Evaluation  GOALS: Goals reviewed with patient? No  SHORT TERM GOALS: Target date: 12/03/24  Pt will be independent with home exercise program in order to improve balance and decrease dizziness symptoms in order to decrease fall risk and improve function at home and work. Baseline: HEP to be given and first follow up appointment Goal status: INITIAL  2.  Pt will report no falls for the next 3 weeks for improved performance with community ambulation and increased safety.  Baseline: see eval subjective Goal  status: INITIAL  LONG TERM GOALS: Target date: 12/25/23  Patient will report of a improvement of at least 50% in dizziness symptoms for improved quality of life and safety with community ambulation.  Baseline:  Goal status: INITIAL  2.  Patient will demonstrate reduced falls risk as evidenced by Dynamic Gait Index (DGI) >19/24 or FGA (at least a 22/30). Baseline: TBA Goal status: INITIAL  3.  Patient will demonstrate [facility] right cervical rotation with no pull in neck and no dizziness for improved performance driving.  Baseline: see objective Goal status: INITIAL  4.  Patient will demonstrate a negative result on R [facility] for decreased vertigo symptoms and improved quality of life.  Baseline: see objective Goal status: INITIAL     ASSESSMENT:  CLINICAL IMPRESSION: ***  Eval: Patient is a 89 y.o. female who was seen today for physical therapy evaluation and treatment for R42 (ICD-10-CM) - Dizziness.   Patient demonstrates increased dizziness with right cervical rotation and R dix hall pike testing. Therapist not completely convinced that peripheral vertigo is only cause of dizziness at this time. Pt also demonstrates limited functional transfer and bed mobility. Pt demonstrates decreased LE strength, abnormal pain rating in tail bone, and impaired balance. Patient also demonstrates difficulty with ambulation during today's session with cane and decreased stride length and velocity noted. Patient also demonstrates nervousness of rolling maneuvers and hesitant although is eventually convinced. Patient requires education on role of PT, causes of dizziness, and overall POC. Patient would benefit from skilled physical therapy for decreased dizziness symptoms, increased endurance with ambulation, increased LE strength, and balance for improved gait quality, return to higher level of function with ADLs, and progress towards therapy goals.   OBJECTIVE IMPAIRMENTS: Abnormal gait, decreased  activity tolerance, decreased balance, decreased endurance, decreased knowledge of condition, decreased knowledge of use of DME, decreased mobility, difficulty walking, decreased ROM, decreased strength, dizziness, postural dysfunction, and pain.   ACTIVITY LIMITATIONS: carrying, lifting, bending, sitting, standing, squatting, stairs, transfers, and bed mobility  PARTICIPATION LIMITATIONS: meal prep, cleaning, laundry, driving, shopping, community activity, and yard work  PERSONAL FACTORS: Age, Past/current experiences, Time since onset of injury/illness/exacerbation, and 1 comorbidity: diabetes are also affecting patient's functional outcome.   REHAB POTENTIAL: Fair chronic in nature  CLINICAL DECISION MAKING: Stable/uncomplicated  EVALUATION COMPLEXITY: Low   PLAN:  PT FREQUENCY: 2x/week  PT DURATION: 6 weeks  PLANNED INTERVENTIONS: 97110-Therapeutic exercises, 97530- Therapeutic activity, 97112- Neuromuscular re-education, 97535- Self Care, 02859- Manual therapy, [phone]- Gait training, Patient/Family education, Balance training, Stair training, Joint mobilization, Joint manipulation, Spinal manipulation, Spinal mobilization, Vestibular training, DME instructions, Cryotherapy, and Moist heat  PLAN FOR NEXT SESSION: 5TSTS, TUG, assess balance and LE strength, official cervical spine rotation ROM, review goals and provide HEP if applicable   Lacinda Fass, PT, DPT  7:36 AM, 11/17/24

## 2024-11-20 ENCOUNTER — Other Ambulatory Visit: Payer: Self-pay | Admitting: Family Medicine

## 2024-11-20 ENCOUNTER — Encounter (HOSPITAL_COMMUNITY): Payer: Self-pay

## 2024-11-20 ENCOUNTER — Ambulatory Visit (HOSPITAL_COMMUNITY)

## 2024-11-20 DIAGNOSIS — M25471 Effusion, right ankle: Secondary | ICD-10-CM

## 2024-11-20 DIAGNOSIS — R42 Dizziness and giddiness: Secondary | ICD-10-CM | POA: Diagnosis not present

## 2024-11-20 DIAGNOSIS — Z7409 Other reduced mobility: Secondary | ICD-10-CM

## 2024-11-20 NOTE — Therapy (Signed)
 OUTPATIENT PHYSICAL THERAPY VESTIBULAR EVALUATION     Patient Name: Kimberly Best MRN: [id number] DOB:01/20/1933, 89 y.o., female Today's Date: 11/20/2024  END OF SESSION:  PT End of Session - 11/20/24 1336     Visit Number 2    Date for Recertification  12/24/24    Authorization Type [employer] MEDICARE HMO/PPO    Authorization Time Period no auth    Progress Note Due on Visit 8    PT Start Time 1336    PT Stop Time 1405    PT Time Calculation (min) 29 min    Activity Tolerance Patient tolerated treatment well    Behavior During Therapy WFL for tasks assessed/performed           Past Medical History:  Diagnosis Date   Allergy    Anxiety    Arthritis    Cancer (HCC)    right breast cancer   Diabetes mellitus without complication (HCC)    GERD (gastroesophageal reflux disease)    H. pylori infection 05/2020   S/p treatment with Pylera .  H. pylori stool antigen negative on 12/21/2020.   HLD (hyperlipidemia)    Hypertension    IDA (iron deficiency anemia)    Hg in 9-10 range since September 2019.   Personal history of radiation therapy    Pessary maintenance 03/10/2015   Past Surgical History:  Procedure Laterality Date   BIOPSY  05/26/2020   Procedure: BIOPSY;  Surgeon: Shaaron Lamar HERO, MD;  Location: [facility];  Service: Endoscopy;;   BREAST BIOPSY Left 2013   fribrocystic change with calcs   BREAST LUMPECTOMY Right    CHOLECYSTECTOMY     COLONOSCOPY N/A 05/26/2020   Procedure: COLONOSCOPY;  Surgeon: Shaaron Lamar HERO, MD; Diverticulosis in sigmoid colon, friable colonic mucosa likely secondary to ASA/NSAID insult, exam was otherwise normal.     ESOPHAGOGASTRODUODENOSCOPY N/A 05/26/2020   Procedure: ESOPHAGOGASTRODUODENOSCOPY (EGD);  Surgeon: Shaaron Lamar HERO, MD;  Normal esophagus s/p dilation, mucosal changes in the stomach biopsied, normal examined duodenum.  Pathology revealed H. pylori.   EYE SURGERY     GIVENS CAPSULE STUDY N/A 02/16/2021   Procedure:  GIVENS CAPSULE STUDY;  Surgeon: Shaaron Lamar HERO, MD;  gastric and duodenal bulbar erosions, 2 nonbleeding small bowel ulcers with benign appearance suspected to be secondary to chronic aspirin use.   Patient Active Problem List   Diagnosis Date Noted   Cramp in lower leg 10/13/2024   Encounter for immunization 10/13/2024   Dizziness 10/12/2024   Fx sacrum/coccyx-closed (HCC) 09/24/2024   Osteoarthritis of left hip 05/12/2024   Stress due to family tension 07/24/2023   Tension headache 07/24/2023   Acute adjustment disorder with anxiety 06/26/2023   Encounter for annual general medical examination with abnormal findings in adult 12/18/2022   Essential hypertension 04/13/2022   Seasonal allergies 04/13/2022   Environmental allergies 01/30/2022   Morbid obesity (HCC) 12/06/2021   Gastric erosion    H. pylori infection 07/14/2020   NSAID long-term use 07/14/2020   Anemia 04/14/2020   Pessary maintenance 04/14/2020   Gastroesophageal reflux disease 01/14/2020   Osteoarthritis of right shoulder 01/14/2020   Osteoporosis 09/16/2019   Controlled diabetes mellitus type 2 with complications (HCC) 09/16/2019   Hyperlipidemia LDL goal <100 09/16/2019   Problem with vaginal pessary 09/03/2018   Fitting and adjustment of pessary 03/10/2015   Uterovaginal prolapse, complete 02/18/2015   Complete uterovaginal prolapse 02/01/2015   Female stress incontinence 06/10/2014   Urge incontinence 06/10/2014    PCP: Edman,  Meade PEDLAR, FNP  REFERRING PROVIDER: Cook, Jayce G, DO  REFERRING DIAG: R42 (ICD-10-CM) - Dizziness  THERAPY DIAG:  Dizziness and giddiness  Impaired functional mobility, balance, gait, and endurance  ONSET DATE: 12-13 years  Rationale for Evaluation and Treatment: Rehabilitation  SUBJECTIVE:   SUBJECTIVE STATEMENT: Pt states she is still a little woozy and eyes burning, has appointment in January. Pt states she has not had any falls since last session.   Eval: Pt  states she has been dealing with the vertigo for over 12 years. Pt states she fell in [place]  in recent weeks, fractured tail bone. Still is not feeling well, has to sit on padding. Pt states they told her fx would heal with time. Pt states she takes meclizine  once per day at night. Pt states she gets dizzy about every time she lays down. Pt states she is also is having trouble with her eyes, eye appointment scheduled for January.  Pt accompanied by: self  PERTINENT HISTORY:  Breast cancer Fall history Hx of vertigo  PAIN:  Are you having pain? Yes: NPRS scale: 8/10 Pain location: tail bone Pain description: dull pain Aggravating factors: sitting on it Relieving factors: tylenol , sitting on something  PRECAUTIONS: Fall  RED FLAGS: None   WEIGHT BEARING RESTRICTIONS: No  FALLS: Has patient fallen in last 6 months? Yes. Number of falls Pt states she fell out of cart, fracture tail bone  LIVING ENVIRONMENT: Lives with: lives alone and pt has an aide 3 days a week Lives in: House/apartment Stairs: Yes: Internal: 7-8 steps; on right going up Has following equipment at home: Ramped entry  PLOF: Independent with basic ADLs, Independent with household mobility with device, Independent with community mobility with device, and Independent with transfers  PATIENT GOALS: decreased the dizziness, increase balance, get legs stronger  OBJECTIVE:  Note: Objective measures were completed at Evaluation unless otherwise noted.  DIAGNOSTIC FINDINGS: None applicable  COGNITION: Overall cognitive status: Within functional limits for tasks assessed   SENSATION: Light touch: Impaired  numbness and tingling in hands and feet due to diabetes   Cervical ROM:    Active A/PROM (deg) eval  Flexion 43  Extension 60, right shoulder tightness  Right lateral flexion 40  Left lateral flexion 40, tight in right shoulder  Right rotation 70  Left rotation 69, tightness on right side  (Blank rows  = not tested)  LOWER EXTREMITY MMT: TBA  MMT Right eval Left eval  Hip flexion    Hip abduction    Hip adduction    Hip internal rotation    Hip external rotation    Knee flexion    Knee extension    Ankle dorsiflexion    Ankle plantarflexion    Ankle inversion    Ankle eversion    (Blank rows = not tested)  VESTIBULAR ASSESSMENT:   OCULOMOTOR EXAM:  Ocular Alignment: normal  Ocular ROM: No Limitations  Spontaneous Nystagmus: absent  Gaze-Induced Nystagmus: absent  Smooth Pursuits: intact  Saccades: hypometric/undershoots, vertical  Convergence/Divergence: less than 6 cm     VESTIBULAR - OCULAR REFLEX:   Slow VOR: Positive Right, little dizziness, slight pull in neck  VOR Cancellation: Normal, slight pull in neck     POSITIONAL TESTING: Right Dix-Hallpike: could not be assessed as pt was too nervous and had eyes closed for dizziness spell Left Dix-Hallpike: not tested    FUNCTIONAL GAIT: 5 times sit to stand: 14.54 seconds Timed up and go (TUG): 18.62  TREATMENT DATE:  11/20/2024  Canalith repositioning, 5TSTS, TUG, education on BPPV and positive for up beating torsional nystagmus on R dix hall pike, completed Epley maneuver for R posterior canal treatment, improved symptoms following, HEP given VOR and functional mobility  11/12/2024  Evaluation: -ROM measured, Strength assessed, HEP prescribed, pt educated on prognosis, findings, and importance of HEP compliance if given. -Positive R Intel, nystagmus present, pt unable to keep eyes open during initial dizzy spell, no nausea       Canalith Repositioning:  Epley Right: Number of Reps: 1  PATIENT EDUCATION: Education details: Pt was educated on findings of PT evaluation, prognosis, frequency of therapy visits and rationale, attendance policy, and HEP if given.   Person  educated: Patient Education method: Explanation, Demonstration, and Verbal cues Education comprehension: verbalized understanding, verbal cues required, and needs further education  HOME EXERCISE PROGRAM:  Access Code: [email or web address] URL: [email or web address] Date: 11/20/2024 Prepared by: Lang Ada  Exercises - Sit to Stand  - 1 x daily - 7 x weekly - 3 sets - 10 reps - Standing Heel Raise with Support  - 1 x daily - 7 x weekly - 3 sets - 10 reps - Seated Gaze Stabilization with Head Nod  - 1 x daily - 7 x weekly - 3 sets - 10 reps - Seated Gaze Stabilization with Head Rotation  - 1 x daily - 7 x weekly - 3 sets - 10 reps - Seated VOR Cancellation  - 1 x daily - 7 x weekly - 3 sets - 10 reps  GOALS: Goals reviewed with patient? No  SHORT TERM GOALS: Target date: 12/03/24  Pt will be independent with home exercise program in order to improve balance and decrease dizziness symptoms in order to decrease fall risk and improve function at home and work. Baseline: HEP to be given and first follow up appointment Goal status: INITIAL  2.  Pt will report no falls for the next 3 weeks for improved performance with community ambulation and increased safety.  Baseline: see eval subjective Goal status: INITIAL  LONG TERM GOALS: Target date: 12/25/23  Patient will report of a improvement of at least 50% in dizziness symptoms for improved quality of life and safety with community ambulation.  Baseline:  Goal status: INITIAL  2.  Patient will demonstrate reduced falls risk as evidenced by Dynamic Gait Index (DGI) >19/24 or FGA (at least a 22/30). Baseline: TBA Goal status: INITIAL  3.  Patient will demonstrate [facility] right cervical rotation with no pull in neck and no dizziness for improved performance driving.  Baseline: see objective Goal status: INITIAL  4.  Patient will demonstrate a negative result on R [facility] for decreased vertigo symptoms and improved quality of  life.  Baseline: see objective Goal status: INITIAL     ASSESSMENT:  CLINICAL IMPRESSION: Patient continues to demonstrate increased dizziness, decreased LE strength, decreased gait quality and balance. Patient also demonstrates improved performance on positional testing and epley maneuver with positive for up beating torsional nystagmus, decreased symptoms following treatment for R posterior canal BPPV. Obtained cervical spine ROM measurements with increased tension on R shoulder region, to add HEP exercises next session. Likely decreased dizziness following todays session due to response to Epley maneuver. Patient would continue to benefit from skilled physical therapy for decreased dizziness, increased endurance with ambulation, increased LE strength, and improved balance for improved quality of life, improved independence with gait training and continued progress towards therapy goals.  Eval: Patient is a 89 y.o. female who was seen today for physical therapy evaluation and treatment for R42 (ICD-10-CM) - Dizziness. Patient demonstrates increased dizziness with right cervical rotation and R dix hall pike testing. Therapist not completely convinced that peripheral vertigo is only cause of dizziness at this time. Pt also demonstrates limited functional transfer and bed mobility. Pt demonstrates decreased LE strength, abnormal pain rating in tail bone, and impaired balance. Patient also demonstrates difficulty with ambulation during today's session with cane and decreased stride length and velocity noted. Patient also demonstrates nervousness of rolling maneuvers and hesitant although is eventually convinced. Patient requires education on role of PT, causes of dizziness, and overall POC. Patient would benefit from skilled physical therapy for decreased dizziness symptoms, increased endurance with ambulation, increased LE strength, and balance for improved gait quality, return to higher level of  function with ADLs, and progress towards therapy goals.   OBJECTIVE IMPAIRMENTS: Abnormal gait, decreased activity tolerance, decreased balance, decreased endurance, decreased knowledge of condition, decreased knowledge of use of DME, decreased mobility, difficulty walking, decreased ROM, decreased strength, dizziness, postural dysfunction, and pain.   ACTIVITY LIMITATIONS: carrying, lifting, bending, sitting, standing, squatting, stairs, transfers, and bed mobility  PARTICIPATION LIMITATIONS: meal prep, cleaning, laundry, driving, shopping, community activity, and yard work  PERSONAL FACTORS: Age, Past/current experiences, Time since onset of injury/illness/exacerbation, and 1 comorbidity: diabetes are also affecting patient's functional outcome.   REHAB POTENTIAL: Fair chronic in nature  CLINICAL DECISION MAKING: Stable/uncomplicated  EVALUATION COMPLEXITY: Low   PLAN:  PT FREQUENCY: 2x/week  PT DURATION: 6 weeks  PLANNED INTERVENTIONS: 97110-Therapeutic exercises, 97530- Therapeutic activity, 97112- Neuromuscular re-education, 97535- Self Care, [id number]- Manual therapy, [phone]- Gait training, Patient/Family education, Balance training, Stair training, Joint mobilization, Joint manipulation, Spinal manipulation, Spinal mobilization, Vestibular training, DME instructions, Cryotherapy, and Moist heat  PLAN FOR NEXT SESSION: assess balance and LE strength, HEP for cervical spine rotation ROM   Lang Ada, PT, DPT [facility] Office: [phone] 2:21 PM, 11/20/2024

## 2024-11-25 ENCOUNTER — Other Ambulatory Visit: Payer: Self-pay | Admitting: Family Medicine

## 2024-11-25 ENCOUNTER — Ambulatory Visit (HOSPITAL_COMMUNITY)

## 2024-11-25 ENCOUNTER — Ambulatory Visit: Admitting: Obstetrics & Gynecology

## 2024-11-25 ENCOUNTER — Encounter: Payer: Self-pay | Admitting: Obstetrics & Gynecology

## 2024-11-25 ENCOUNTER — Encounter (HOSPITAL_COMMUNITY): Payer: Self-pay

## 2024-11-25 VITALS — BP 130/78 | HR 101 | Ht <= 58 in | Wt 170.0 lb

## 2024-11-25 DIAGNOSIS — Z7409 Other reduced mobility: Secondary | ICD-10-CM

## 2024-11-25 DIAGNOSIS — Z4689 Encounter for fitting and adjustment of other specified devices: Secondary | ICD-10-CM

## 2024-11-25 DIAGNOSIS — R42 Dizziness and giddiness: Secondary | ICD-10-CM

## 2024-11-25 DIAGNOSIS — N813 Complete uterovaginal prolapse: Secondary | ICD-10-CM

## 2024-11-25 NOTE — Progress Notes (Signed)
 Chief Complaint  Patient presents with   Pessary Check    Blood pressure 130/78, pulse (!) 101, height 4' 8 (1.422 m), weight 170 lb (77.1 kg).  Kimberly Best presents today for routine follow up related to her pessary.   She uses a Milex ring with support #4 She reports no vaginal discharge and no vaginal bleeding   Likert scale(1 not bothersome -5 very bothersome)  :  1  Exam reveals no undue vaginal mucosal pressure of breakdown, no discharge and no vaginal bleeding.  Vaginal Epithelial Abnormality Classification System:   0 0    No abnormalities 1    Epithelial erythema 2    Granulation tissue 3    Epithelial break or erosion, 1 cm or less 4    Epithelial break or erosion, 1 cm or greater  The pessary is removed, cleaned and replaced without difficulty.      ICD-10-CM   1. Pessary maintenance, refit 10/18/20 with Milex ring with support #5  Z46.89     2. Uterovaginal prolapse, complete  N81.3        [name] will be sen back in 4 months for continued follow up.  [name] Inch, MD  11/25/2024 11:53 AM

## 2024-11-25 NOTE — Therapy (Signed)
 OUTPATIENT PHYSICAL THERAPY VESTIBULAR TREATMENT      Patient Name: Kimberly Best MRN: [id number] DOB:July 03, 1933, 89 y.o., female Today's Date: 11/25/2024  END OF SESSION:  PT End of Session - 11/25/24 0908     Visit Number 3    Date for Recertification  12/24/24    Authorization Type [employer] MEDICARE HMO/PPO    Authorization Time Period no auth    Progress Note Due on Visit 8    PT Start Time 0908   Patient arrived late to her appointment.   PT Stop Time [phone]    PT Time Calculation (min) 30 min    Activity Tolerance Patient tolerated treatment well    Behavior During Therapy WFL for tasks assessed/performed            Past Medical History:  Diagnosis Date   Allergy    Anxiety    Arthritis    Cancer (HCC)    right breast cancer   Diabetes mellitus without complication (HCC)    GERD (gastroesophageal reflux disease)    H. pylori infection 05/2020   S/p treatment with Pylera .  H. pylori stool antigen negative on 12/21/2020.   HLD (hyperlipidemia)    Hypertension    IDA (iron deficiency anemia)    Hg in 9-10 range since September 2019.   Personal history of radiation therapy    Pessary maintenance 03/10/2015   Past Surgical History:  Procedure Laterality Date   BIOPSY  05/26/2020   Procedure: BIOPSY;  Surgeon: Shaaron Lamar HERO, MD;  Location: [facility];  Service: Endoscopy;;   BREAST BIOPSY Left 2013   fribrocystic change with calcs   BREAST LUMPECTOMY Right    CHOLECYSTECTOMY     COLONOSCOPY N/A 05/26/2020   Procedure: COLONOSCOPY;  Surgeon: Shaaron Lamar HERO, MD; Diverticulosis in sigmoid colon, friable colonic mucosa likely secondary to ASA/NSAID insult, exam was otherwise normal.     ESOPHAGOGASTRODUODENOSCOPY N/A 05/26/2020   Procedure: ESOPHAGOGASTRODUODENOSCOPY (EGD);  Surgeon: Shaaron Lamar HERO, MD;  Normal esophagus s/p dilation, mucosal changes in the stomach biopsied, normal examined duodenum.  Pathology revealed H. pylori.   EYE SURGERY     GIVENS  CAPSULE STUDY N/A 02/16/2021   Procedure: GIVENS CAPSULE STUDY;  Surgeon: Shaaron Lamar HERO, MD;  gastric and duodenal bulbar erosions, 2 nonbleeding small bowel ulcers with benign appearance suspected to be secondary to chronic aspirin use.   Patient Active Problem List   Diagnosis Date Noted   Cramp in lower leg 10/13/2024   Encounter for immunization 10/13/2024   Dizziness 10/12/2024   Fx sacrum/coccyx-closed (HCC) 09/24/2024   Osteoarthritis of left hip 05/12/2024   Stress due to family tension 07/24/2023   Tension headache 07/24/2023   Acute adjustment disorder with anxiety 06/26/2023   Encounter for annual general medical examination with abnormal findings in adult 12/18/2022   Essential hypertension 04/13/2022   Seasonal allergies 04/13/2022   Environmental allergies 01/30/2022   Morbid obesity (HCC) 12/06/2021   Gastric erosion    H. pylori infection 07/14/2020   NSAID long-term use 07/14/2020   Anemia 04/14/2020   Pessary maintenance 04/14/2020   Gastroesophageal reflux disease 01/14/2020   Osteoarthritis of right shoulder 01/14/2020   Osteoporosis 09/16/2019   Controlled diabetes mellitus type 2 with complications (HCC) 09/16/2019   Hyperlipidemia LDL goal <100 09/16/2019   Problem with vaginal pessary 09/03/2018   Fitting and adjustment of pessary 03/10/2015   Uterovaginal prolapse, complete 02/18/2015   Complete uterovaginal prolapse 02/01/2015   Female stress incontinence 06/10/2014  Urge incontinence 06/10/2014    PCP: Edman Meade PEDLAR, FNP  REFERRING PROVIDER: Cook, Jayce G, DO  REFERRING DIAG: R42 (ICD-10-CM) - Dizziness  THERAPY DIAG:  Dizziness and giddiness  Impaired functional mobility, balance, gait, and endurance  ONSET DATE: 12-13 years  Rationale for Evaluation and Treatment: Rehabilitation  SUBJECTIVE:   SUBJECTIVE STATEMENT: Patient reports that she is not dizzy right now, but little lightheaded. Her right shoulder is hurting a little,  but not as bad as it was. She feels like she is improving. She is doing her HEP and she can tell that they are helping.   Eval: Pt states she has been dealing with the vertigo for over 12 years. Pt states she fell in [place]  in recent weeks, fractured tail bone. Still is not feeling well, has to sit on padding. Pt states they told her fx would heal with time. Pt states she takes meclizine  once per day at night. Pt states she gets dizzy about every time she lays down. Pt states she is also is having trouble with her eyes, eye appointment scheduled for January.  Pt accompanied by: self  PERTINENT HISTORY:  Breast cancer Fall history Hx of vertigo  PAIN:  Are you having pain? Yes: NPRS scale: 8/10 Pain location: tail bone Pain description: dull pain Aggravating factors: sitting on it Relieving factors: tylenol , sitting on something  PRECAUTIONS: Fall  RED FLAGS: None   WEIGHT BEARING RESTRICTIONS: No  FALLS: Has patient fallen in last 6 months? Yes. Number of falls Pt states she fell out of cart, fracture tail bone  LIVING ENVIRONMENT: Lives with: lives alone and pt has an aide 3 days a week Lives in: House/apartment Stairs: Yes: Internal: 7-8 steps; on right going up Has following equipment at home: Ramped entry  PLOF: Independent with basic ADLs, Independent with household mobility with device, Independent with community mobility with device, and Independent with transfers  PATIENT GOALS: decreased the dizziness, increase balance, get legs stronger  OBJECTIVE:  Note: Objective measures were completed at Evaluation unless otherwise noted.  DIAGNOSTIC FINDINGS: None applicable  COGNITION: Overall cognitive status: Within functional limits for tasks assessed   SENSATION: Light touch: Impaired  numbness and tingling in hands and feet due to diabetes   Cervical ROM:    Active A/PROM (deg) eval  Flexion 43  Extension 60, right shoulder tightness  Right lateral flexion  40  Left lateral flexion 40, tight in right shoulder  Right rotation 70  Left rotation 69, tightness on right side  (Blank rows = not tested)  LOWER EXTREMITY MMT: TBA  MMT Right eval Left eval  Hip flexion    Hip abduction    Hip adduction    Hip internal rotation    Hip external rotation    Knee flexion    Knee extension    Ankle dorsiflexion    Ankle plantarflexion    Ankle inversion    Ankle eversion    (Blank rows = not tested)  VESTIBULAR ASSESSMENT:   OCULOMOTOR EXAM:  Ocular Alignment: normal  Ocular ROM: No Limitations  Spontaneous Nystagmus: absent  Gaze-Induced Nystagmus: absent  Smooth Pursuits: intact  Saccades: hypometric/undershoots, vertical  Convergence/Divergence: less than 6 cm     VESTIBULAR - OCULAR REFLEX:   Slow VOR: Positive Right, little dizziness, slight pull in neck  VOR Cancellation: Normal, slight pull in neck     POSITIONAL TESTING: Right Dix-Hallpike: could not be assessed as pt was too nervous and had eyes closed for  dizziness spell Left Dix-Hallpike: not tested    FUNCTIONAL GAIT: 5 times sit to stand: 14.54 seconds Timed up and go (TUG): 18.62                                                                                                                             TREATMENT DATE:                                    11/25/24 EXERCISE LOG  Exercise Repetitions and Resistance Comments  R epley maneuver  X1  Resolved symptoms   HEP review     Static stance on foam  2 minutes  Intermittent UE support   Upper trapezius stretch  5 x 20 seconds each    Cervical AROM  10 reps each     Blank cell = exercise not performed today   11/20/2024  Canalith repositioning, 5TSTS, TUG, education on BPPV and positive for up beating torsional nystagmus on R dix hall pike, completed Epley maneuver for R posterior canal treatment, improved symptoms following, HEP given VOR and functional mobility  11/12/2024  Evaluation: -ROM measured,  Strength assessed, HEP prescribed, pt educated on prognosis, findings, and importance of HEP compliance if given. -Positive R [employer], nystagmus present, pt unable to keep eyes open during initial dizzy spell, no nausea       Canalith Repositioning:  Epley Right: Number of Reps: 1  PATIENT EDUCATION: Education details: Pt was educated on HEP, POC, prognosis, healing, and goals for physical therapy  Person educated: Patient Education method: Explanation, Demonstration, and Verbal cues Education comprehension: verbalized understanding, verbal cues required, and needs further education  HOME EXERCISE PROGRAM:  Access Code: [email or web address] URL: [email or web address] Date: 11/20/2024 Prepared by: Lang Ada  Exercises - Sit to Stand  - 1 x daily - 7 x weekly - 3 sets - 10 reps - Standing Heel Raise with Support  - 1 x daily - 7 x weekly - 3 sets - 10 reps - Seated Gaze Stabilization with Head Nod  - 1 x daily - 7 x weekly - 3 sets - 10 reps - Seated Gaze Stabilization with Head Rotation  - 1 x daily - 7 x weekly - 3 sets - 10 reps - Seated VOR Cancellation  - 1 x daily - 7 x weekly - 3 sets - 10 reps  GOALS: Goals reviewed with patient? No  SHORT TERM GOALS: Target date: 12/03/24  Pt will be independent with home exercise program in order to improve balance and decrease dizziness symptoms in order to decrease fall risk and improve function at home and work. Baseline: HEP to be given and first follow up appointment Goal status: INITIAL  2.  Pt will report no falls for the next 3 weeks for improved performance with community ambulation and increased safety.  Baseline: see eval subjective Goal status: INITIAL  LONG TERM GOALS: Target date: 12/25/23  Patient will report of a improvement of at least 50% in dizziness symptoms for improved quality of life and safety with community ambulation.  Baseline:  Goal status: INITIAL  2.  Patient will demonstrate reduced  falls risk as evidenced by Dynamic Gait Index (DGI) >19/24 or FGA (at least a 22/30). Baseline: TBA Goal status: INITIAL  3.  Patient will demonstrate [facility] right cervical rotation with no pull in neck and no dizziness for improved performance driving.  Baseline: see objective Goal status: INITIAL  4.  Patient will demonstrate a negative result on R [facility] for decreased vertigo symptoms and improved quality of life.  Baseline: see objective Goal status: INITIAL     ASSESSMENT:  CLINICAL IMPRESSION: Patient presented to treatment reporting a mild sensations of lightheadedness. However, this was able to be completely resolved upon completing the Epley maneuver for the right posterior canal. Her HEP was reviewed and she was able to properly demonstrate all of these interventions. She was introduced to a seated upper trapezius stretch to facilitate improved cervical mobility. She required minimal cueing with this new intervention for a prolonged hold to facilitate improved soft tissue extensibility. She experienced no increase in her familiar symptoms with any of today's interventions. She reported feeling good upon the conclusion of treatment. Patient continues to require skilled physical therapy to address her remaining impairments to return to her prior level of function.     Eval: Patient is a 89 y.o. female who was seen today for physical therapy evaluation and treatment for R42 (ICD-10-CM) - Dizziness. Patient demonstrates increased dizziness with right cervical rotation and R dix hall pike testing. Therapist not completely convinced that peripheral vertigo is only cause of dizziness at this time. Pt also demonstrates limited functional transfer and bed mobility. Pt demonstrates decreased LE strength, abnormal pain rating in tail bone, and impaired balance. Patient also demonstrates difficulty with ambulation during today's session with cane and decreased stride length and velocity noted.  Patient also demonstrates nervousness of rolling maneuvers and hesitant although is eventually convinced. Patient requires education on role of PT, causes of dizziness, and overall POC. Patient would benefit from skilled physical therapy for decreased dizziness symptoms, increased endurance with ambulation, increased LE strength, and balance for improved gait quality, return to higher level of function with ADLs, and progress towards therapy goals.   OBJECTIVE IMPAIRMENTS: Abnormal gait, decreased activity tolerance, decreased balance, decreased endurance, decreased knowledge of condition, decreased knowledge of use of DME, decreased mobility, difficulty walking, decreased ROM, decreased strength, dizziness, postural dysfunction, and pain.   ACTIVITY LIMITATIONS: carrying, lifting, bending, sitting, standing, squatting, stairs, transfers, and bed mobility  PARTICIPATION LIMITATIONS: meal prep, cleaning, laundry, driving, shopping, community activity, and yard work  PERSONAL FACTORS: Age, Past/current experiences, Time since onset of injury/illness/exacerbation, and 1 comorbidity: diabetes are also affecting patient's functional outcome.   REHAB POTENTIAL: Fair chronic in nature  CLINICAL DECISION MAKING: Stable/uncomplicated  EVALUATION COMPLEXITY: Low   PLAN:  PT FREQUENCY: 2x/week  PT DURATION: 6 weeks  PLANNED INTERVENTIONS: 97110-Therapeutic exercises, 97530- Therapeutic activity, 97112- Neuromuscular re-education, 97535- Self Care, [id number]- Manual therapy, [phone]- Gait training, Patient/Family education, Balance training, Stair training, Joint mobilization, Joint manipulation, Spinal manipulation, Spinal mobilization, Vestibular training, DME instructions, Cryotherapy, and Moist heat  PLAN FOR NEXT SESSION: assess balance and LE strength, HEP for cervical spine rotation ROM   Lacinda Fass, PT, DPT  12:31 PM, 11/25/2024

## 2024-12-01 ENCOUNTER — Ambulatory Visit (HOSPITAL_COMMUNITY)
Admission: RE | Admit: 2024-12-01 | Discharge: 2024-12-01 | Disposition: A | Discharge status: Home / Self Care | Source: Ambulatory Visit | Attending: Family Medicine | Admitting: Family Medicine

## 2024-12-01 ENCOUNTER — Encounter (HOSPITAL_COMMUNITY): Payer: Self-pay

## 2024-12-01 DIAGNOSIS — Z1231 Encounter for screening mammogram for malignant neoplasm of breast: Secondary | ICD-10-CM | POA: Insufficient documentation

## 2024-12-02 ENCOUNTER — Ambulatory Visit (HOSPITAL_COMMUNITY)

## 2024-12-08 ENCOUNTER — Other Ambulatory Visit: Payer: Self-pay | Admitting: Family Medicine

## 2024-12-08 DIAGNOSIS — K219 Gastro-esophageal reflux disease without esophagitis: Secondary | ICD-10-CM

## 2024-12-08 DIAGNOSIS — I10 Essential (primary) hypertension: Secondary | ICD-10-CM

## 2024-12-08 DIAGNOSIS — M25471 Effusion, right ankle: Secondary | ICD-10-CM

## 2024-12-12 ENCOUNTER — Other Ambulatory Visit: Payer: Self-pay | Admitting: Family Medicine

## 2024-12-15 ENCOUNTER — Other Ambulatory Visit: Payer: Self-pay

## 2024-12-15 DIAGNOSIS — J302 Other seasonal allergic rhinitis: Secondary | ICD-10-CM

## 2024-12-15 MED ORDER — MONTELUKAST SODIUM 10 MG PO TABS: 10.0000 mg | ORAL_TABLET | Freq: Every day | ORAL | 2 refills | Status: AC

## 2024-12-16 ENCOUNTER — Encounter (HOSPITAL_COMMUNITY): Payer: Self-pay

## 2024-12-16 ENCOUNTER — Ambulatory Visit (HOSPITAL_COMMUNITY): Attending: Family Medicine

## 2024-12-16 DIAGNOSIS — R42 Dizziness and giddiness: Secondary | ICD-10-CM | POA: Diagnosis present

## 2024-12-16 DIAGNOSIS — Z7409 Other reduced mobility: Secondary | ICD-10-CM | POA: Insufficient documentation

## 2024-12-16 NOTE — Therapy (Signed)
 " OUTPATIENT PHYSICAL THERAPY VESTIBULAR TREATMENT      Patient Name: Kimberly Best MRN: [id number] DOB:1933/04/11, 89 y.o., female Today's Date: 12/16/2024  END OF SESSION:  PT End of Session - 12/16/24 0819     Visit Number 4    Date for Recertification  12/24/24    Authorization Type [employer] MEDICARE HMO/PPO    Authorization Time Period no auth    Progress Note Due on Visit 8    PT Start Time 0816    PT Stop Time 0858    PT Time Calculation (min) 42 min    Activity Tolerance Patient tolerated treatment well    Behavior During Therapy [facility] for tasks assessed/performed             Past Medical History:  Diagnosis Date   Allergy    Anxiety    Arthritis    Cancer (HCC)    right breast cancer   Diabetes mellitus without complication (HCC)    GERD (gastroesophageal reflux disease)    H. pylori infection 05/2020   S/p treatment with Pylera .  H. pylori stool antigen negative on 12/21/2020.   HLD (hyperlipidemia)    Hypertension    IDA (iron deficiency anemia)    Hg in 9-10 range since September 2019.   Personal history of radiation therapy    Pessary maintenance 03/10/2015   Past Surgical History:  Procedure Laterality Date   BIOPSY  05/26/2020   Procedure: BIOPSY;  Surgeon: Shaaron Lamar HERO, MD;  Location: [facility];  Service: Endoscopy;;   BREAST BIOPSY Left 2013   fribrocystic change with calcs   BREAST LUMPECTOMY Right    CHOLECYSTECTOMY     COLONOSCOPY N/A 05/26/2020   Procedure: COLONOSCOPY;  Surgeon: Shaaron Lamar HERO, MD; Diverticulosis in sigmoid colon, friable colonic mucosa likely secondary to ASA/NSAID insult, exam was otherwise normal.     ESOPHAGOGASTRODUODENOSCOPY N/A 05/26/2020   Procedure: ESOPHAGOGASTRODUODENOSCOPY (EGD);  Surgeon: Shaaron Lamar HERO, MD;  Normal esophagus s/p dilation, mucosal changes in the stomach biopsied, normal examined duodenum.  Pathology revealed H. pylori.   EYE SURGERY     GIVENS CAPSULE STUDY N/A 02/16/2021   Procedure:  GIVENS CAPSULE STUDY;  Surgeon: Shaaron Lamar HERO, MD;  gastric and duodenal bulbar erosions, 2 nonbleeding small bowel ulcers with benign appearance suspected to be secondary to chronic aspirin use.   Patient Active Problem List   Diagnosis Date Noted   Cramp in lower leg 10/13/2024   Encounter for immunization 10/13/2024   Dizziness 10/12/2024   Fx sacrum/coccyx-closed (HCC) 09/24/2024   Osteoarthritis of left hip 05/12/2024   Stress due to family tension 07/24/2023   Tension headache 07/24/2023   Acute adjustment disorder with anxiety 06/26/2023   Encounter for annual general medical examination with abnormal findings in adult 12/18/2022   Essential hypertension 04/13/2022   Seasonal allergies 04/13/2022   Environmental allergies 01/30/2022   Morbid obesity (HCC) 12/06/2021   Gastric erosion    H. pylori infection 07/14/2020   NSAID long-term use 07/14/2020   Anemia 04/14/2020   Pessary maintenance 04/14/2020   Gastroesophageal reflux disease 01/14/2020   Osteoarthritis of right shoulder 01/14/2020   Osteoporosis 09/16/2019   Controlled diabetes mellitus type 2 with complications (HCC) 09/16/2019   Hyperlipidemia LDL goal <100 09/16/2019   Problem with vaginal pessary 09/03/2018   Fitting and adjustment of pessary 03/10/2015   Uterovaginal prolapse, complete 02/18/2015   Complete uterovaginal prolapse 02/01/2015   Female stress incontinence 06/10/2014   Urge incontinence 06/10/2014  PCP: Edman Meade PEDLAR, FNP  REFERRING PROVIDER: Cook, Jayce G, DO  REFERRING DIAG: R42 (ICD-10-CM) - Dizziness  THERAPY DIAG:  Dizziness and giddiness  Impaired functional mobility, balance, gait, and endurance  ONSET DATE: 12-13 years  Rationale for Evaluation and Treatment: Rehabilitation  SUBJECTIVE:   SUBJECTIVE STATEMENT: Patient reports that she feels good today. She has not had any dizziness since her last appointment.   Eval: Pt states she has been dealing with the  vertigo for over 12 years. Pt states she fell in [place]  in recent weeks, fractured tail bone. Still is not feeling well, has to sit on padding. Pt states they told her fx would heal with time. Pt states she takes meclizine  once per day at night. Pt states she gets dizzy about every time she lays down. Pt states she is also is having trouble with her eyes, eye appointment scheduled for January.  Pt accompanied by: self  PERTINENT HISTORY:  Breast cancer Fall history Hx of vertigo  PAIN:  Are you having pain? Yes: NPRS scale: 8/10 Pain location: tail bone Pain description: dull pain Aggravating factors: sitting on it Relieving factors: tylenol , sitting on something  PRECAUTIONS: Fall  RED FLAGS: None   WEIGHT BEARING RESTRICTIONS: No  FALLS: Has patient fallen in last 6 months? Yes. Number of falls Pt states she fell out of cart, fracture tail bone  LIVING ENVIRONMENT: Lives with: lives alone and pt has an aide 3 days a week Lives in: House/apartment Stairs: Yes: Internal: 7-8 steps; on right going up Has following equipment at home: Ramped entry  PLOF: Independent with basic ADLs, Independent with household mobility with device, Independent with community mobility with device, and Independent with transfers  PATIENT GOALS: decreased the dizziness, increase balance, get legs stronger  OBJECTIVE:  Note: Objective measures were completed at Evaluation unless otherwise noted.  DIAGNOSTIC FINDINGS: None applicable  COGNITION: Overall cognitive status: Within functional limits for tasks assessed   SENSATION: Light touch: Impaired  numbness and tingling in hands and feet due to diabetes   Cervical ROM:    Active A/PROM (deg) eval  Flexion 43  Extension 60, right shoulder tightness  Right lateral flexion 40  Left lateral flexion 40, tight in right shoulder  Right rotation 70  Left rotation 69, tightness on right side  (Blank rows = not tested)  LOWER EXTREMITY MMT:  TBA  MMT Right eval Left eval  Hip flexion    Hip abduction    Hip adduction    Hip internal rotation    Hip external rotation    Knee flexion    Knee extension    Ankle dorsiflexion    Ankle plantarflexion    Ankle inversion    Ankle eversion    (Blank rows = not tested)  VESTIBULAR ASSESSMENT:   OCULOMOTOR EXAM:  Ocular Alignment: normal  Ocular ROM: No Limitations  Spontaneous Nystagmus: absent  Gaze-Induced Nystagmus: absent  Smooth Pursuits: intact  Saccades: hypometric/undershoots, vertical  Convergence/Divergence: less than 6 cm     VESTIBULAR - OCULAR REFLEX:   Slow VOR: Positive Right, little dizziness, slight pull in neck  VOR Cancellation: Normal, slight pull in neck     POSITIONAL TESTING: Right Dix-Hallpike: could not be assessed as pt was too nervous and had eyes closed for dizziness spell Left Dix-Hallpike: not tested    FUNCTIONAL GAIT: 5 times sit to stand: 14.54 seconds Timed up and go (TUG): 18.62  TREATMENT DATE:                                    12/16/24 EXERCISE LOG  Exercise Repetitions and Resistance Comments  Static stance on foam  2 minutes  Intermittent UE support; with dual tasking   LAQ  3# x 2 minutes    Standing march   3# x 2 minutes  Alternating LE   Step up  4 step x 1.5 minutes  Alternating LE; BUE support from parallel bars   Sit to stand  18 reps  Without UE support   Walking  3 laps x 20 feet  With SPC; with head turns and changes in gait speed  Nustep  L4 x 5 minutes    Side stepping over hurdles  2 minutes  Small hurdles    Blank cell = exercise not performed today                                    11/25/24 EXERCISE LOG  Exercise Repetitions and Resistance Comments  R epley maneuver  X1  Resolved symptoms   HEP review     Static stance on foam  2 minutes  Intermittent UE support   Upper  trapezius stretch  5 x 20 seconds each    Cervical AROM  10 reps each     Blank cell = exercise not performed today   11/20/2024  Canalith repositioning, 5TSTS, TUG, education on BPPV and positive for up beating torsional nystagmus on R dix hall pike, completed Epley maneuver for R posterior canal treatment, improved symptoms following, HEP given VOR and functional mobility  PATIENT EDUCATION: Education details: Pt was educated on POC   Person educated: Patient Education method: Explanation Education comprehension: verbalized understanding  HOME EXERCISE PROGRAM:  Access Code: [email or web address] URL: [email or web address] Date: 11/20/2024 Prepared by: Lang Ada  Exercises - Sit to Stand  - 1 x daily - 7 x weekly - 3 sets - 10 reps - Standing Heel Raise with Support  - 1 x daily - 7 x weekly - 3 sets - 10 reps - Seated Gaze Stabilization with Head Nod  - 1 x daily - 7 x weekly - 3 sets - 10 reps - Seated Gaze Stabilization with Head Rotation  - 1 x daily - 7 x weekly - 3 sets - 10 reps - Seated VOR Cancellation  - 1 x daily - 7 x weekly - 3 sets - 10 reps  GOALS: Goals reviewed with patient? No  SHORT TERM GOALS: Target date: 12/03/24  Pt will be independent with home exercise program in order to improve balance and decrease dizziness symptoms in order to decrease fall risk and improve function at home and work. Baseline: HEP to be given and first follow up appointment Goal status: INITIAL  2.  Pt will report no falls for the next 3 weeks for improved performance with community ambulation and increased safety.  Baseline: see eval subjective Goal status: INITIAL  LONG TERM GOALS: Target date: 12/25/23  Patient will report of a improvement of at least 50% in dizziness symptoms for improved quality of life and safety with community ambulation.  Baseline:  Goal status: INITIAL  2.  Patient will demonstrate reduced falls risk as evidenced by Dynamic Gait Index (DGI)  >19/24 or FGA (at least a 22/30). Baseline:  TBA Goal status: INITIAL  3.  Patient will demonstrate [facility] right cervical rotation with no pull in neck and no dizziness for improved performance driving.  Baseline: see objective Goal status: INITIAL  4.  Patient will demonstrate a negative result on R [facility] for decreased vertigo symptoms and improved quality of life.  Baseline: see objective Goal status: INITIAL     ASSESSMENT:  CLINICAL IMPRESSION: Patient presented to treatment reporting none of her familiar dizziness since her last physical therapy appointment. As a result, the Epley maneuver was not completed today. Today's treatment focused on improved static and dynamic stability. She required minimal cueing with side stepping over hurdles. She experienced no increase in her familiar dizziness with any of today's interventions. She reported feeling good upon the conclusion of treatment. Patient continues to require skilled physical therapy to address her remaining impairments to return to her prior level of function.    Eval: Patient is a 89 y.o. female who was seen today for physical therapy evaluation and treatment for R42 (ICD-10-CM) - Dizziness. Patient demonstrates increased dizziness with right cervical rotation and R dix hall pike testing. Therapist not completely convinced that peripheral vertigo is only cause of dizziness at this time. Pt also demonstrates limited functional transfer and bed mobility. Pt demonstrates decreased LE strength, abnormal pain rating in tail bone, and impaired balance. Patient also demonstrates difficulty with ambulation during today's session with cane and decreased stride length and velocity noted. Patient also demonstrates nervousness of rolling maneuvers and hesitant although is eventually convinced. Patient requires education on role of PT, causes of dizziness, and overall POC. Patient would benefit from skilled physical therapy for decreased  dizziness symptoms, increased endurance with ambulation, increased LE strength, and balance for improved gait quality, return to higher level of function with ADLs, and progress towards therapy goals.   OBJECTIVE IMPAIRMENTS: Abnormal gait, decreased activity tolerance, decreased balance, decreased endurance, decreased knowledge of condition, decreased knowledge of use of DME, decreased mobility, difficulty walking, decreased ROM, decreased strength, dizziness, postural dysfunction, and pain.   ACTIVITY LIMITATIONS: carrying, lifting, bending, sitting, standing, squatting, stairs, transfers, and bed mobility  PARTICIPATION LIMITATIONS: meal prep, cleaning, laundry, driving, shopping, community activity, and yard work  PERSONAL FACTORS: Age, Past/current experiences, Time since onset of injury/illness/exacerbation, and 1 comorbidity: diabetes are also affecting patient's functional outcome.   REHAB POTENTIAL: Fair chronic in nature  CLINICAL DECISION MAKING: Stable/uncomplicated  EVALUATION COMPLEXITY: Low   PLAN:  PT FREQUENCY: 2x/week  PT DURATION: 6 weeks  PLANNED INTERVENTIONS: 97110-Therapeutic exercises, 97530- Therapeutic activity, 97112- Neuromuscular re-education, 97535- Self Care, [id number]- Manual therapy, [phone]- Gait training, Patient/Family education, Balance training, Stair training, Joint mobilization, Joint manipulation, Spinal manipulation, Spinal mobilization, Vestibular training, DME instructions, Cryotherapy, and Moist heat  PLAN FOR NEXT SESSION: assess balance and LE strength, HEP for cervical spine rotation ROM   Lacinda Fass, PT, DPT  9:36 AM, 12/16/2024   "

## 2024-12-19 ENCOUNTER — Ambulatory Visit (HOSPITAL_COMMUNITY)

## 2024-12-19 ENCOUNTER — Encounter (HOSPITAL_COMMUNITY): Payer: Self-pay

## 2024-12-19 DIAGNOSIS — Z7409 Other reduced mobility: Secondary | ICD-10-CM

## 2024-12-19 DIAGNOSIS — R42 Dizziness and giddiness: Secondary | ICD-10-CM | POA: Diagnosis not present

## 2024-12-19 NOTE — Therapy (Signed)
 " OUTPATIENT PHYSICAL THERAPY VESTIBULAR TREATMENT      Patient Name: Kimberly Best MRN: [id number] DOB:1933-01-21, 89 y.o., female Today's Date: 12/19/2024  END OF SESSION:  PT End of Session - 12/19/24 1311     Visit Number 5    Date for Recertification  12/24/24    Authorization Type [employer] MEDICARE HMO/PPO    Authorization Time Period no auth    Progress Note Due on Visit 8    PT Start Time 1246    PT Stop Time 1318    PT Time Calculation (min) 32 min    Activity Tolerance Patient tolerated treatment well    Behavior During Therapy WFL for tasks assessed/performed              Past Medical History:  Diagnosis Date   Allergy    Anxiety    Arthritis    Cancer (HCC)    right breast cancer   Diabetes mellitus without complication (HCC)    GERD (gastroesophageal reflux disease)    H. pylori infection 05/2020   S/p treatment with Pylera .  H. pylori stool antigen negative on 12/21/2020.   HLD (hyperlipidemia)    Hypertension    IDA (iron deficiency anemia)    Hg in 9-10 range since September 2019.   Personal history of radiation therapy    Pessary maintenance 03/10/2015   Past Surgical History:  Procedure Laterality Date   BIOPSY  05/26/2020   Procedure: BIOPSY;  Surgeon: Shaaron Lamar HERO, MD;  Location: [facility];  Service: Endoscopy;;   BREAST BIOPSY Left 2013   fribrocystic change with calcs   BREAST LUMPECTOMY Right    CHOLECYSTECTOMY     COLONOSCOPY N/A 05/26/2020   Procedure: COLONOSCOPY;  Surgeon: Shaaron Lamar HERO, MD; Diverticulosis in sigmoid colon, friable colonic mucosa likely secondary to ASA/NSAID insult, exam was otherwise normal.     ESOPHAGOGASTRODUODENOSCOPY N/A 05/26/2020   Procedure: ESOPHAGOGASTRODUODENOSCOPY (EGD);  Surgeon: Shaaron Lamar HERO, MD;  Normal esophagus s/p dilation, mucosal changes in the stomach biopsied, normal examined duodenum.  Pathology revealed H. pylori.   EYE SURGERY     GIVENS CAPSULE STUDY N/A 02/16/2021   Procedure:  GIVENS CAPSULE STUDY;  Surgeon: Shaaron Lamar HERO, MD;  gastric and duodenal bulbar erosions, 2 nonbleeding small bowel ulcers with benign appearance suspected to be secondary to chronic aspirin use.   Patient Active Problem List   Diagnosis Date Noted   Cramp in lower leg 10/13/2024   Encounter for immunization 10/13/2024   Dizziness 10/12/2024   Fx sacrum/coccyx-closed (HCC) 09/24/2024   Osteoarthritis of left hip 05/12/2024   Stress due to family tension 07/24/2023   Tension headache 07/24/2023   Acute adjustment disorder with anxiety 06/26/2023   Encounter for annual general medical examination with abnormal findings in adult 12/18/2022   Essential hypertension 04/13/2022   Seasonal allergies 04/13/2022   Environmental allergies 01/30/2022   Morbid obesity (HCC) 12/06/2021   Gastric erosion    H. pylori infection 07/14/2020   NSAID long-term use 07/14/2020   Anemia 04/14/2020   Pessary maintenance 04/14/2020   Gastroesophageal reflux disease 01/14/2020   Osteoarthritis of right shoulder 01/14/2020   Osteoporosis 09/16/2019   Controlled diabetes mellitus type 2 with complications (HCC) 09/16/2019   Hyperlipidemia LDL goal <100 09/16/2019   Problem with vaginal pessary 09/03/2018   Fitting and adjustment of pessary 03/10/2015   Uterovaginal prolapse, complete 02/18/2015   Complete uterovaginal prolapse 02/01/2015   Female stress incontinence 06/10/2014   Urge incontinence 06/10/2014  PCP: Edman Meade PEDLAR, FNP  REFERRING PROVIDER: Cook, Jayce G, DO  REFERRING DIAG: R42 (ICD-10-CM) - Dizziness  THERAPY DIAG:  Dizziness and giddiness  Impaired functional mobility, balance, gait, and endurance  ONSET DATE: 12-13 years  Rationale for Evaluation and Treatment: Rehabilitation  SUBJECTIVE:   SUBJECTIVE STATEMENT: Patient reports that she feels good today. She feels that she is back to 100%. She has not had any dizziness.   Eval: Pt states she has been dealing with  the vertigo for over 12 years. Pt states she fell in [place]  in recent weeks, fractured tail bone. Still is not feeling well, has to sit on padding. Pt states they told her fx would heal with time. Pt states she takes meclizine  once per day at night. Pt states she gets dizzy about every time she lays down. Pt states she is also is having trouble with her eyes, eye appointment scheduled for January.  Pt accompanied by: self  PERTINENT HISTORY:  Breast cancer Fall history Hx of vertigo  PAIN:  Are you having pain? Yes: NPRS scale: 0/10 Pain location: tail bone Pain description: dull pain Aggravating factors: sitting on it Relieving factors: tylenol , sitting on something  PRECAUTIONS: Fall  RED FLAGS: None   WEIGHT BEARING RESTRICTIONS: No  FALLS: Has patient fallen in last 6 months? Yes. Number of falls Pt states she fell out of cart, fracture tail bone  LIVING ENVIRONMENT: Lives with: lives alone and pt has an aide 3 days a week Lives in: House/apartment Stairs: Yes: Internal: 7-8 steps; on right going up Has following equipment at home: Ramped entry  PLOF: Independent with basic ADLs, Independent with household mobility with device, Independent with community mobility with device, and Independent with transfers  PATIENT GOALS: decreased the dizziness, increase balance, get legs stronger  OBJECTIVE:  Note: Objective measures were completed at Evaluation unless otherwise noted.  DIAGNOSTIC FINDINGS: None applicable  COGNITION: Overall cognitive status: Within functional limits for tasks assessed   SENSATION: Light touch: Impaired  numbness and tingling in hands and feet due to diabetes   Cervical ROM:    Active A/PROM (deg) eval  Flexion 43  Extension 60, right shoulder tightness  Right lateral flexion 40  Left lateral flexion 40, tight in right shoulder  Right rotation 70  Left rotation 69, tightness on right side  (Blank rows = not tested)  LOWER EXTREMITY  MMT: TBA  MMT Right eval Left eval  Hip flexion    Hip abduction    Hip adduction    Hip internal rotation    Hip external rotation    Knee flexion    Knee extension    Ankle dorsiflexion    Ankle plantarflexion    Ankle inversion    Ankle eversion    (Blank rows = not tested)  VESTIBULAR ASSESSMENT:   OCULOMOTOR EXAM:  Ocular Alignment: normal  Ocular ROM: No Limitations  Spontaneous Nystagmus: absent  Gaze-Induced Nystagmus: absent  Smooth Pursuits: intact  Saccades: hypometric/undershoots, vertical  Convergence/Divergence: less than 6 cm     VESTIBULAR - OCULAR REFLEX:   Slow VOR: Positive Right, little dizziness, slight pull in neck  VOR Cancellation: Normal, slight pull in neck     POSITIONAL TESTING: Right Dix-Hallpike: could not be assessed as pt was too nervous and had eyes closed for dizziness spell Left Dix-Hallpike: not tested    FUNCTIONAL GAIT: 5 times sit to stand: 14.54 seconds Timed up and go (TUG): 18.62  FUNCTIONAL TESTS:  Dynamic Gait  Index: Dynamic Gait Index  Mark the lowest level that applies.   Date Performed 12/19/24  Gait level surface (2) Mild Impairment: Walks 20', uses AD, slower speed, mild gait deviations  2. Change in gait speed (3) Normal: Able to smoothly change walking speed without loss of balance or gait deviation. Shows a significant difference in walking speeds between normal, fast and slow speeds  3. Gait with horizontal head turns (2) Mild Impairment: Performs head turns smoothly with slight change in gait velocity, i.e., minor disruption to smooth gait path or uses walking aid  4. Gait with vertical head turns (2) Mild Impairment: Performs head turns smoothly with slight change in gait velocity, i.e., minor disruption to smooth gait path or uses walking aid  5. Gait and pivot turn (2) Mild Impairment: Pivot turns safely in > 3 seconds and stops with no loss of balance  6. Step over obstacle (0) Severe Impairment: Cannot  perform without assistance  7. Step around obstacle (3) Normal: Is able to walk around cones safely without changing gait speed; no evidence of imbalance  8. Steps (1) Moderate Impairment: Two feet to a stair, must use rail  Total score 15/24    Score Interpretation: Score of <19 indicates high risk of falls.  Minimally Clinically Important Difference (MCID):  =DGI scores of<21/24 = 1.80 points DGI scores of >21/24 = 0.60 points   [place] T, Inbar-Borovsky N, Brozgol M, Giladi N, [place] JM. The Dynamic Gait Index in healthy older adults: the role of stair climbing, fear of falling and gender. Gait Posture. 2009 Feb;29(2):237-41. doi: 10.1016/j.gaitpost.2008.08.013. Epub 2008 Oct 8. PMID: 81154560; PMCID: EFR7290501.  Pardasaney, MYRTIS LOIS Bonus, GEANNIE POUR., et al. (2012). Sensitivity to change and responsiveness of four balance measures for community-dwelling older adults. Physical therapy 92(3): 388-397.                                                                                                                              TREATMENT DATE:                                    12/19/24 EXERCISE LOG  Exercise Repetitions and Resistance Comments  Nustep  L4 x 5 minutes   Goal assessment  See below    HEP  Reviewed and updated             Blank cell = exercise not performed today                                    12/16/24 EXERCISE LOG  Exercise Repetitions and Resistance Comments  Static stance on foam  2 minutes  Intermittent UE support; with dual tasking   LAQ  3# x 2 minutes    Standing march   3# x 2 minutes  Alternating  LE   Step up  4 step x 1.5 minutes  Alternating LE; BUE support from parallel bars   Sit to stand  18 reps  Without UE support   Walking  3 laps x 20 feet  With SPC; with head turns and changes in gait speed  Nustep  L4 x 5 minutes    Side stepping over hurdles  2 minutes  Small hurdles    Blank cell = exercise not performed today                                     11/25/24 EXERCISE LOG  Exercise Repetitions and Resistance Comments  R epley maneuver  X1  Resolved symptoms   HEP review     Static stance on foam  2 minutes  Intermittent UE support   Upper trapezius stretch  5 x 20 seconds each    Cervical AROM  10 reps each     Blank cell = exercise not performed today    PATIENT EDUCATION: Education details: Pt was educated on POC   Person educated: Patient Education method: Explanation Education comprehension: verbalized understanding  HOME EXERCISE PROGRAM:  Access Code: [email or web address] URL: [email or web address] Date: 12/19/2024 Prepared by: Lacinda Fass  Exercises - Seated Upper Trapezius Stretch  - 1 x daily - 7 x weekly - 3 sets - 30 seconds hold - Sit to Stand  - 1 x daily - 7 x weekly - 3 sets - 10 reps - Mini Squat with Counter Support  - 1 x daily - 7 x weekly - 3 sets - 10 reps - Heel Raises with Counter Support  - 1 x daily - 7 x weekly - 3 sets - 10 reps - Standing March with Counter Support  - 1 x daily - 7 x weekly - 3 sets - 10 reps  Access Code: [email or web address] URL: [email or web address] Date: 11/20/2024 Prepared by: Lang Ada  Exercises - Sit to Stand  - 1 x daily - 7 x weekly - 3 sets - 10 reps - Standing Heel Raise with Support  - 1 x daily - 7 x weekly - 3 sets - 10 reps - Seated Gaze Stabilization with Head Nod  - 1 x daily - 7 x weekly - 3 sets - 10 reps - Seated Gaze Stabilization with Head Rotation  - 1 x daily - 7 x weekly - 3 sets - 10 reps - Seated VOR Cancellation  - 1 x daily - 7 x weekly - 3 sets - 10 reps  GOALS: Goals reviewed with patient? No  SHORT TERM GOALS: Target date: 12/03/24  Pt will be independent with home exercise program in order to improve balance and decrease dizziness symptoms in order to decrease fall risk and improve function at home and work. Baseline: she reports doing her HEP 3-4 times per day Goal status: MET  2.  Pt will report no falls for the next  3 weeks for improved performance with community ambulation and increased safety.  Baseline: no falls  Goal status: MET  LONG TERM GOALS: Target date: 12/25/23  Patient will report of a improvement of at least 50% in dizziness symptoms for improved quality of life and safety with community ambulation.  Baseline: 100% improvement Goal status: MET  2.  Patient will demonstrate reduced falls risk as evidenced by Dynamic Gait Index (DGI) >19/24 or FGA (at least a 22/30). Baseline: 15/24 Goal  status: NOT MET  3.  Patient will demonstrate [facility] right cervical rotation with no pull in neck and no dizziness for improved performance driving.  Baseline:  Goal status: MET  4.  Patient will demonstrate a negative result on R [facility] for decreased vertigo symptoms and improved quality of life.  Baseline: see objective Goal status: MET     ASSESSMENT:  CLINICAL IMPRESSION: Patient has made excellent progress with skilled physical therapy as evidenced by her subjective reports, objective measures, functional mobility, and progress toward her goals. She was able to meet most of her goals for skilled physical therapy. However, she was unable to meet her DGI goal as she scored 15/24 (goal was 19/24). Her HEP was reviewed and updated. She reported feeling comfortable with these interventions. She reported feeling good upon the conclusion of treatment. She reported feeling comfortable being discharged at this time.   PHYSICAL THERAPY DISCHARGE SUMMARY  Visits from Start of Care: 5  Current functional level related to goals / functional outcomes: Patient was able to meet most of her goals for skilled physical therapy.   Remaining deficits: None    Education / Equipment: HEP    Patient agrees to discharge. Patient goals were partially met. Patient is being discharged due to being pleased with the current functional level.     Eval: Patient is a 89 y.o. female who was seen today for  physical therapy evaluation and treatment for R42 (ICD-10-CM) - Dizziness. Patient demonstrates increased dizziness with right cervical rotation and R dix hall pike testing. Therapist not completely convinced that peripheral vertigo is only cause of dizziness at this time. Pt also demonstrates limited functional transfer and bed mobility. Pt demonstrates decreased LE strength, abnormal pain rating in tail bone, and impaired balance. Patient also demonstrates difficulty with ambulation during today's session with cane and decreased stride length and velocity noted. Patient also demonstrates nervousness of rolling maneuvers and hesitant although is eventually convinced. Patient requires education on role of PT, causes of dizziness, and overall POC. Patient would benefit from skilled physical therapy for decreased dizziness symptoms, increased endurance with ambulation, increased LE strength, and balance for improved gait quality, return to higher level of function with ADLs, and progress towards therapy goals.   OBJECTIVE IMPAIRMENTS: Abnormal gait, decreased activity tolerance, decreased balance, decreased endurance, decreased knowledge of condition, decreased knowledge of use of DME, decreased mobility, difficulty walking, decreased ROM, decreased strength, dizziness, postural dysfunction, and pain.   ACTIVITY LIMITATIONS: carrying, lifting, bending, sitting, standing, squatting, stairs, transfers, and bed mobility  PARTICIPATION LIMITATIONS: meal prep, cleaning, laundry, driving, shopping, community activity, and yard work  PERSONAL FACTORS: Age, Past/current experiences, Time since onset of injury/illness/exacerbation, and 1 comorbidity: diabetes are also affecting patient's functional outcome.   REHAB POTENTIAL: Fair chronic in nature  CLINICAL DECISION MAKING: Stable/uncomplicated  EVALUATION COMPLEXITY: Low   PLAN:  PT FREQUENCY: 2x/week  PT DURATION: 6 weeks  PLANNED INTERVENTIONS:  97110-Therapeutic exercises, 97530- Therapeutic activity, 97112- Neuromuscular re-education, 97535- Self Care, [id number]- Manual therapy, [phone]- Gait training, Patient/Family education, Balance training, Stair training, Joint mobilization, Joint manipulation, Spinal manipulation, Spinal mobilization, Vestibular training, DME instructions, Cryotherapy, and Moist heat  PLAN FOR NEXT SESSION: assess balance and LE strength, HEP for cervical spine rotation ROM   Lacinda Fass, PT, DPT  1:33 PM, 12/19/2024   "

## 2024-12-23 ENCOUNTER — Ambulatory Visit (HOSPITAL_COMMUNITY)

## 2024-12-25 ENCOUNTER — Ambulatory Visit (HOSPITAL_COMMUNITY)

## 2025-01-12 ENCOUNTER — Other Ambulatory Visit: Payer: Self-pay | Admitting: Family Medicine

## 2025-01-12 DIAGNOSIS — M25471 Effusion, right ankle: Secondary | ICD-10-CM

## 2025-02-10 ENCOUNTER — Ambulatory Visit: Admitting: Family Medicine

## 2025-10-05 ENCOUNTER — Ambulatory Visit
